# Patient Record
Sex: Female | Born: 1940 | Hispanic: No | Marital: Married | Smoking: Former smoker
Health system: Southern US, Community
[De-identification: ages and names within clinical notes are randomized; demographics above are authoritative.]

## PROBLEM LIST (undated history)

## (undated) DIAGNOSIS — R509 Fever, unspecified: Secondary | ICD-10-CM

## (undated) DIAGNOSIS — R140 Abdominal distension (gaseous): Secondary | ICD-10-CM

## (undated) DIAGNOSIS — J189 Pneumonia, unspecified organism: Secondary | ICD-10-CM

## (undated) DIAGNOSIS — D4959 Neoplasm of unspecified behavior of other genitourinary organ: Secondary | ICD-10-CM

## (undated) DIAGNOSIS — R0902 Hypoxemia: Secondary | ICD-10-CM

## (undated) DIAGNOSIS — I10 Essential (primary) hypertension: Secondary | ICD-10-CM

## (undated) DIAGNOSIS — G4733 Obstructive sleep apnea (adult) (pediatric): Secondary | ICD-10-CM

## (undated) DIAGNOSIS — E039 Hypothyroidism, unspecified: Secondary | ICD-10-CM

## (undated) DIAGNOSIS — I509 Heart failure, unspecified: Secondary | ICD-10-CM

## (undated) DIAGNOSIS — I2510 Atherosclerotic heart disease of native coronary artery without angina pectoris: Secondary | ICD-10-CM

## (undated) DIAGNOSIS — N838 Other noninflammatory disorders of ovary, fallopian tube and broad ligament: Secondary | ICD-10-CM

## (undated) DIAGNOSIS — R519 Headache, unspecified: Secondary | ICD-10-CM

## (undated) DIAGNOSIS — J3489 Other specified disorders of nose and nasal sinuses: Secondary | ICD-10-CM

## (undated) DIAGNOSIS — I110 Hypertensive heart disease with heart failure: Secondary | ICD-10-CM

## (undated) DIAGNOSIS — R32 Unspecified urinary incontinence: Secondary | ICD-10-CM

## (undated) DIAGNOSIS — R29898 Other symptoms and signs involving the musculoskeletal system: Secondary | ICD-10-CM

## (undated) DIAGNOSIS — I639 Cerebral infarction, unspecified: Secondary | ICD-10-CM

## (undated) DIAGNOSIS — IMO0002 Reserved for concepts with insufficient information to code with codable children: Secondary | ICD-10-CM

## (undated) DIAGNOSIS — K219 Gastro-esophageal reflux disease without esophagitis: Secondary | ICD-10-CM

## (undated) DIAGNOSIS — D72829 Elevated white blood cell count, unspecified: Secondary | ICD-10-CM

## (undated) DIAGNOSIS — T4145XA Adverse effect of unspecified anesthetic, initial encounter: Secondary | ICD-10-CM

## (undated) DIAGNOSIS — R0602 Shortness of breath: Secondary | ICD-10-CM

## (undated) DIAGNOSIS — R51 Headache: Secondary | ICD-10-CM

## (undated) DIAGNOSIS — R269 Unspecified abnormalities of gait and mobility: Secondary | ICD-10-CM

## (undated) DIAGNOSIS — T8859XA Other complications of anesthesia, initial encounter: Secondary | ICD-10-CM

## (undated) DIAGNOSIS — R4701 Aphasia: Secondary | ICD-10-CM

## (undated) DIAGNOSIS — M1990 Unspecified osteoarthritis, unspecified site: Secondary | ICD-10-CM

## (undated) DIAGNOSIS — I69359 Hemiplegia and hemiparesis following cerebral infarction affecting unspecified side: Secondary | ICD-10-CM

## (undated) DIAGNOSIS — I69328 Other speech and language deficits following cerebral infarction: Secondary | ICD-10-CM

## (undated) DIAGNOSIS — N189 Chronic kidney disease, unspecified: Secondary | ICD-10-CM

## (undated) DIAGNOSIS — E7800 Pure hypercholesterolemia, unspecified: Secondary | ICD-10-CM

## (undated) DIAGNOSIS — R6510 Systemic inflammatory response syndrome (SIRS) of non-infectious origin without acute organ dysfunction: Secondary | ICD-10-CM

## (undated) DIAGNOSIS — I5032 Chronic diastolic (congestive) heart failure: Secondary | ICD-10-CM

## (undated) HISTORY — DX: Cerebral infarction, unspecified: I63.9

## (undated) HISTORY — DX: Elevated white blood cell count, unspecified: D72.829

## (undated) HISTORY — PX: CHOLECYSTECTOMY: SHX55

## (undated) HISTORY — DX: Essential (primary) hypertension: I10

## (undated) HISTORY — DX: Headache: R51

## (undated) HISTORY — DX: Pneumonia, unspecified organism: J18.9

## (undated) HISTORY — DX: Headache, unspecified: R51.9

## (undated) HISTORY — DX: Obstructive sleep apnea (adult) (pediatric): G47.33

## (undated) HISTORY — PX: TONSILLECTOMY: SUR1361

## (undated) HISTORY — DX: Chronic diastolic (congestive) heart failure: I50.32

## (undated) HISTORY — DX: Unspecified osteoarthritis, unspecified site: M19.90

## (undated) HISTORY — DX: Hemiplegia and hemiparesis following cerebral infarction affecting unspecified side: I69.359

## (undated) HISTORY — DX: Pure hypercholesterolemia, unspecified: E78.00

## (undated) HISTORY — DX: Reserved for concepts with insufficient information to code with codable children: IMO0002

## (undated) HISTORY — DX: Heart failure, unspecified: I50.9

## (undated) HISTORY — DX: Shortness of breath: R06.02

## (undated) HISTORY — DX: Hypertensive heart disease with heart failure: I11.0

## (undated) HISTORY — DX: Neoplasm of unspecified behavior of other genitourinary organ: D49.59

## (undated) HISTORY — DX: Fever, unspecified: R50.9

## (undated) HISTORY — DX: Gastro-esophageal reflux disease without esophagitis: K21.9

## (undated) HISTORY — DX: Abdominal distension (gaseous): R14.0

## (undated) HISTORY — DX: Aphasia: R47.01

## (undated) HISTORY — DX: Other symptoms and signs involving the musculoskeletal system: R29.898

## (undated) HISTORY — PX: ABDOMINAL HYSTERECTOMY: SHX81

## (undated) HISTORY — DX: Unspecified urinary incontinence: R32

## (undated) HISTORY — DX: Hypothyroidism, unspecified: E03.9

## (undated) HISTORY — DX: Other speech and language deficits following cerebral infarction: I69.328

## (undated) HISTORY — PX: MENISCUS REPAIR: SHX5179

## (undated) HISTORY — PX: TOE SURGERY: SHX1073

## (undated) HISTORY — DX: Other noninflammatory disorders of ovary, fallopian tube and broad ligament: N83.8

## (undated) HISTORY — PX: TUBAL LIGATION: SHX77

## (undated) HISTORY — DX: Unspecified abnormalities of gait and mobility: R26.9

## (undated) HISTORY — DX: Systemic inflammatory response syndrome (sirs) of non-infectious origin without acute organ dysfunction: R65.10

## (undated) HISTORY — DX: Hypoxemia: R09.02

## (undated) HISTORY — DX: Atherosclerotic heart disease of native coronary artery without angina pectoris: I25.10

---

## 2008-10-29 ENCOUNTER — Encounter: Admission: RE | Admit: 2008-10-29 | Discharge: 2008-10-29 | Payer: Self-pay | Admitting: Neurology

## 2011-10-26 ENCOUNTER — Ambulatory Visit: Payer: Medicare Other | Attending: Gynecologic Oncology | Admitting: Gynecologic Oncology

## 2011-10-26 ENCOUNTER — Encounter: Payer: Self-pay | Admitting: Gynecologic Oncology

## 2011-10-26 VITALS — BP 124/68 | HR 80 | Temp 97.4°F | Resp 20 | Ht 62.84 in | Wt 181.9 lb

## 2011-10-26 DIAGNOSIS — R1909 Other intra-abdominal and pelvic swelling, mass and lump: Secondary | ICD-10-CM | POA: Insufficient documentation

## 2011-10-26 DIAGNOSIS — E039 Hypothyroidism, unspecified: Secondary | ICD-10-CM | POA: Insufficient documentation

## 2011-10-26 DIAGNOSIS — E7800 Pure hypercholesterolemia, unspecified: Secondary | ICD-10-CM | POA: Insufficient documentation

## 2011-10-26 DIAGNOSIS — I69920 Aphasia following unspecified cerebrovascular disease: Secondary | ICD-10-CM | POA: Insufficient documentation

## 2011-10-26 DIAGNOSIS — Z800 Family history of malignant neoplasm of digestive organs: Secondary | ICD-10-CM | POA: Insufficient documentation

## 2011-10-26 DIAGNOSIS — N838 Other noninflammatory disorders of ovary, fallopian tube and broad ligament: Secondary | ICD-10-CM | POA: Insufficient documentation

## 2011-10-26 DIAGNOSIS — Z87891 Personal history of nicotine dependence: Secondary | ICD-10-CM | POA: Insufficient documentation

## 2011-10-26 DIAGNOSIS — R141 Gas pain: Secondary | ICD-10-CM | POA: Insufficient documentation

## 2011-10-26 DIAGNOSIS — I10 Essential (primary) hypertension: Secondary | ICD-10-CM | POA: Insufficient documentation

## 2011-10-26 DIAGNOSIS — N3946 Mixed incontinence: Secondary | ICD-10-CM | POA: Insufficient documentation

## 2011-10-26 DIAGNOSIS — R142 Eructation: Secondary | ICD-10-CM | POA: Insufficient documentation

## 2011-10-26 DIAGNOSIS — Z7982 Long term (current) use of aspirin: Secondary | ICD-10-CM | POA: Insufficient documentation

## 2011-10-26 DIAGNOSIS — Z90710 Acquired absence of both cervix and uterus: Secondary | ICD-10-CM | POA: Insufficient documentation

## 2011-10-26 DIAGNOSIS — Z79899 Other long term (current) drug therapy: Secondary | ICD-10-CM | POA: Insufficient documentation

## 2011-10-26 DIAGNOSIS — K219 Gastro-esophageal reflux disease without esophagitis: Secondary | ICD-10-CM | POA: Insufficient documentation

## 2011-10-26 DIAGNOSIS — R6881 Early satiety: Secondary | ICD-10-CM | POA: Insufficient documentation

## 2011-10-26 DIAGNOSIS — R29898 Other symptoms and signs involving the musculoskeletal system: Secondary | ICD-10-CM | POA: Insufficient documentation

## 2011-10-26 HISTORY — DX: Other noninflammatory disorders of ovary, fallopian tube and broad ligament: N83.8

## 2011-10-26 NOTE — Patient Instructions (Signed)
Followup with Dr. Leonor Liv

## 2011-10-26 NOTE — Progress Notes (Signed)
Consult Note: Gyn-Onc  April Le 70 y.o. female  CC:  Chief Complaint  Patient presents with  . Gynecologic Exam    Mass    HPI: Patient is a very pleasant 70 year old who is seen today in consultation at the request of Dr. Isabel Caprice.  April Le is a 70 year old gravida 2 para 2 who began having increased abdominal girth approximately 2 months ago.  She attributed this to weight gain and eating too much.  She was seen by the PA at Dr. Ellin Goodie office. Ultimately a CT scan of the abdomen and pelvis was ordered for 10/19/2011.  CT scan revealed a 14 x 19 x 19 cm mass appearing to arise from the right adnexa. Along the ventral margin of the cystic mass was a soft tissue attenuating component measuring 4.8 x 1.8 cm. There was no ascites. There is no lymphadenopathy. There is no evidence of any metastatic disease. The patient comes in with her family today to discuss this with determine disposition Interval History:   Review of Systems: She does admit to the increased abdominal girth over the past 2 months. She has had some pain particularly after her ultrasound that has gone away.  She denies any nausea vomiting fevers chills chest pain or shortness of breath. She denies any changes in her bowel habits. She denies any blood in her stool or blood per vagina. She does state that she has had early satiety for about a 1.5 months. She does have some stress and urge incontinence. The stress incontinence is worse with sneezing and coughing. She has a aphasia status post CVA. She has some difficulty and weakness with her legs left greater than right. She is able to dress herself or her daughter helps her. She's able to shower independently. She has a very good quality of life and very good family support Current Meds:  Outpatient Encounter Prescriptions as of 10/26/2011  Medication Sig Dispense Refill  . amLODipine (NORVASC) 10 MG tablet Take 10 mg by mouth daily.        Marland Kitchen aspirin 81 MG tablet Take 81 mg  by mouth daily.        . Aspirin-Dipyridamole (AGGRENOX PO) Take 60 mg by mouth 2 (two) times daily. 60 cpm at 9am and 5pm       . atorvastatin (LIPITOR) 40 MG tablet Take 40 mg by mouth daily.        . cloNIDine (CATAPRES) 0.1 MG tablet Take 0.1 mg by mouth 2 (two) times daily.        Marland Kitchen FA-Pyridoxine-Cyancobalamin (FOLTX PO) Take by mouth daily.        . fenofibrate (TRICOR) 145 MG tablet Take 145 mg by mouth daily.        Marland Kitchen levothyroxine (SYNTHROID, LEVOTHROID) 50 MCG tablet Take 50 mcg by mouth daily.        . magnesium oxide (MAG-OX) 400 MG tablet Take 400 mg by mouth daily.        . metoprolol (LOPRESSOR) 50 MG tablet Take 50 mg by mouth 2 (two) times daily. One at 9 am and at 8pm       . mometasone (NASONEX) 50 MCG/ACT nasal spray Place 2 sprays into the nose daily.        . pantoprazole (PROTONIX) 40 MG tablet Take 40 mg by mouth daily.        Marland Kitchen RAMIPRIL PO Take 1 tablet by mouth 2 (two) times daily. Unsure of dose, one at 9am and at 5pm       .  trimethoprim (TRIMPEX) 100 MG tablet Take 100 mg by mouth Nightly.          Allergy: No Known Allergies  Social Hx:  She has 2 grown children and 4 grandchildren. Her son lives in [place]. History   Social History  . Marital Status: Married    Spouse Name: N/A    Number of Children: N/A  . Years of Education: N/A   Occupational History  . Not on file.   Social History Main Topics  . Smoking status: Former Smoker    Quit date: 11/15/1959  . Smokeless tobacco: Not on file  . Alcohol Use: No  . Drug Use: No  . Sexually Active: No   Other Topics Concern  . Not on file   Social History Narrative  . No narrative on file    Past Surgical Hx:  Past Surgical History  Procedure Date  . Abdominal hysterectomy   . Tubal ligation   . Cholecystectomy   . Meniscus repair   . Tonsillectomy   . Toe surgery     Past Medical Hx:  Past Medical History  Diagnosis Date  . Stroke 02/2007, 08/2009    2 total  . Hypothyroidism   .  GERD (gastroesophageal reflux disease)   . Hypertension   . Hypercholesteremia   . Headache   . Arthritis     right knee  . 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 Urinary incontinence   . Abdominal distention     past two months  . Weakness of left leg     Mildly, post stroke    Family Hx:  Family History  Problem Relation Age of Onset  . Kidney failure Mother   . Heart attack Father   . Heart failure Father   . Stomach cancer Other     Vitals:  Blood pressure 124/68, pulse 80, temperature 97.4 F (36.3 C), temperature source Oral, resp. rate 20, height 5' 2.84" (1.596 m), weight 181 lb 14.4 oz (82.509 kg).  Physical Exam: Well-nourished well-developed female in no acute distress.  Neck: Supple no lymphadenopathy no thyromegaly.  Lungs: Clear to auscultation bilaterally.  Cardiovascular: Regular rate and rhythm. No murmur.  Abdomen: She has a well-healed vertical midline incision. There is no evidence of an incisional hernia. There is a large abdominal pelvic mass that measures approximately 20 cm. It is nontender. There is no nodularity appreciated. There is no fluid wave. Could not assess the size of the liver secondary to habitus as well as the mass.  Pelvic: External genitalia within normal limits. The mass is out of the pelvis. There is no nodularity.  Extremities: 1-2+ nonpitting edema equal bilaterally. 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 She and her family would like to have this removed if at all possible.  I do believe that she would be a reasonable surgical candidate. However, we need to ask Dr. Leonor Liv for her opinion regarding the ability of April Le to come off her Aggrenox in the perioperative period. Ideally like her off of this for 7 - 10 days before surgery.  I would also like to note  Dr. Leonor Liv opinion regarding the need for therapeutic anticoagulation in the perioperative period rather than just the prophylacticanticoagulation that we typically use.  Risks of this surgery including but not limited to bleeding infection injury to surrounding organs were  discussed with the patient. We also discussed the possibility of recurrent strokes in the setting of her coming off her Aggrenox. Also discussed venous thromboembolic disease and the need for sequential compression of places in the perioperative period. At this point we will followup with Dr. recommendations. We will send a copy of this note to her. She states that Dr. Leonor Liv office has been in contact with the patient already regarding the plan for her. Once we hear back from Dr. Leonor Liv regarding her recommendations we will schedule the patient for surgery.  April Yuille A., MD 10/26/2011, 11:35 AM

## 2011-11-03 ENCOUNTER — Encounter (HOSPITAL_COMMUNITY): Payer: Self-pay | Admitting: Pharmacy Technician

## 2011-11-18 ENCOUNTER — Other Ambulatory Visit (HOSPITAL_COMMUNITY): Payer: Medicare Other

## 2011-11-18 ENCOUNTER — Encounter (HOSPITAL_COMMUNITY)
Admission: RE | Admit: 2011-11-18 | Discharge: 2011-11-18 | Disposition: A | Discharge status: Home / Self Care | Payer: Medicare Other | Source: Ambulatory Visit | Attending: Gynecologic Oncology | Admitting: Gynecologic Oncology

## 2011-11-18 ENCOUNTER — Encounter (HOSPITAL_COMMUNITY): Payer: Self-pay

## 2011-11-18 HISTORY — DX: Chronic kidney disease, unspecified: N18.9

## 2011-11-18 HISTORY — DX: Shortness of breath: R06.02

## 2011-11-18 HISTORY — DX: Other complications of anesthesia, initial encounter: T88.59XA

## 2011-11-18 HISTORY — DX: Other specified disorders of nose and nasal sinuses: J34.89

## 2011-11-18 HISTORY — DX: Adverse effect of unspecified anesthetic, initial encounter: T41.45XA

## 2011-11-18 NOTE — Pre-Procedure Instructions (Signed)
PREOP ANESTHESIA CONSULT BY DR. Shireen Quan.  PT'S EKG, CXR, CBC WITH DIFF, AND CMET REPORTS REVIEWED BY DR. Shireen Quan AND ACCEPTED FOR PT'S SURGERY--REPORTS ARE FROM [facility] IN [place] AND PLACED ON PT'S CHART.  DR. Shireen Quan REVIEWED PREOP CLEARANCE LETTER FROM DR. HOLT WITH [facility] (NOTE PLACED ON PT'S CHART)--DR. SCHUSTER DID NOT WANT PT TO GET LOVENOX ON DAY OF SURGERY--AND CALLED [facility]-SPOKE WITH DR. Jannet Askew KHAN TO SEE IF PT NEEDED LOVENOX BRIDGING--DR. SCHUSTER SAID DECISION MADE THAT BRIDGING NOT NEEDED PREOP.

## 2011-11-18 NOTE — Anesthesia Preprocedure Evaluation (Addendum)
Anesthesia Evaluation  Patient identified by MRN, date of birth, ID band Patient awake  General Assessment Comment:Increased CV risk  Reviewed: Allergy & Precautions, H&P , NPO status , Patient's Chart, lab work & pertinent test results, reviewed documented beta blocker date and time   Airway Mallampati: II  Neck ROM: Full  Mouth opening: Limited Mouth Opening  Dental  (+) Teeth Intact   Pulmonary neg pulmonary ROS,    + decreased breath sounds      Cardiovascular hypertension, Pt. on medications Regular Normal Denies cardiac symptoms   Neuro/Psych CVA X2, '08 & '10 Aphasia CVA, Residual Symptoms Negative Psych ROS   GI/Hepatic negative GI ROS, Neg liver ROS, GERD-  ,  Endo/Other  Hypothyroidism On replacement  Renal/GU Reduced renal function, Cr 1.90   Pelvic mass    Musculoskeletal negative musculoskeletal ROS (+)   Abdominal   Peds negative pediatric ROS (+)  Hematology negative hematology ROS (+)   Anesthesia Other Findings   Reproductive/Obstetrics negative OB ROS                          Anesthesia Physical Anesthesia Plan  ASA: III  Anesthesia Plan: General   Post-op Pain Management:    Induction: Intravenous  Airway Management Planned: Oral ETT  Additional Equipment:   Intra-op Plan:   Post-operative Plan: Extubation in OR  Informed Consent: I have reviewed the patients History and Physical, chart, labs and discussed the procedure including the risks, benefits and alternatives for the proposed anesthesia with the patient or authorized representative who has indicated his/her understanding and acceptance.   Dental advisory given  Plan Discussed with: CRNA  Anesthesia Plan Comments:         Anesthesia Quick Evaluation

## 2011-11-18 NOTE — Patient Instructions (Addendum)
20 April Le  11/18/2011   Your procedure is scheduled on:  Tuesday 1/8  AT 10:30 AM  Report to [facility] at 8:00 AM.  Call this number if you have problems the morning of surgery: [phone]   Remember:   Do not eat food OR DRINK ANYTHING AFTER MIDNIGHT THE NIGHT BEFORE YOUR SURGERY.      Take these medicines the morning of surgery with A SIP OF WATER: AMLODIPINE,  LEVOTHYROXINE, METOPROLOL, AND PANTOPRAZOLE.  MAY USE NASONEX NASAL SPRAY.   Do not wear jewelry, make-up or nail polish.  Do not wear lotions, powders, or perfumes. You may wear deodorant.  Do not shave 48 hours prior to surgery.  Do not bring valuables to the hospital.  Contacts, dentures or bridgework may not be worn into surgery.  Leave suitcase in the car. After surgery it may be brought to your room.  For patients admitted to the hospital, checkout time is 11:00 AM the day of discharge.   Patients discharged the day of surgery will not be allowed to drive home.   Special Instructions: CHG Shower Use Special Wash: 1/2 bottle night before surgery and 1/2 bottle morning of surgery.   Please read over the following fact sheets that you were given: Blood Transfusion Information and MRSA Information AND INCENTIVE SPIROMETRY INSTRUCTIONS PT IS NOT TO TAKE HER CLONIDINE AT HOME THE AM OF SURGERY--SHORT STAY WILL PUT CLONIDINE PATCH ON PT ON ARRIVAL FOR SURGERY.

## 2011-11-18 NOTE — Pre-Procedure Instructions (Deleted)
20 Blenda Wisecup  11/18/2011   Your procedure is scheduled on:  Tuesday 1/8  AT 10:30 AM  Report to [facility] at 8:OO AM.  Call this number if you have problems the morning of surgery: [phone]   Remember:   Do not eat food OR DRINK ANYTHING AFTER MIDNIGHT THE NIGHT BEFORE YOUR SURGERY.   Take these medicines the morning of surgery with A SIP OF WATER: AMLODIPINE, CLONIDINE, LEVOTHYROXINE, METOPROLOL, PANTAPRAZOLE  AND MAY USE NASONEX NASAL SPRAY IF NEEDED   Do not wear jewelry, make-up or nail polish.  Do not wear lotions, powders, or perfumes. You may wear deodorant.  Do not shave 48 hours prior to surgery.  Do not bring valuables to the hospital.  Contacts, dentures or bridgework may not be worn into surgery.  Leave suitcase in the car. After surgery it may be brought to your room.  For patients admitted to the hospital, checkout time is 11:00 AM the day of discharge.   Patients discharged the day of surgery will not be allowed to drive home.   Special Instructions: SHOWER WITH CHG SOAP THE NIGHT BEFORE YOUR SURGERY AND THE AM OF YOUR SURGERY.   Please read over the following fact sheets that you were given: Blood Transfusion Information and MRSA Information AND INCENTIVE SPIROMETRY INSTRUCTIONS

## 2011-11-22 ENCOUNTER — Encounter (HOSPITAL_COMMUNITY): Payer: Self-pay | Admitting: *Deleted

## 2011-11-22 ENCOUNTER — Inpatient Hospital Stay (HOSPITAL_COMMUNITY)
Admission: RE | Admit: 2011-11-22 | Discharge: 2011-11-24 | DRG: 743 | Disposition: A | Discharge status: Home / Self Care | Payer: Medicare Other | Source: Ambulatory Visit | Attending: Obstetrics & Gynecology | Admitting: Obstetrics & Gynecology

## 2011-11-22 ENCOUNTER — Other Ambulatory Visit: Payer: Self-pay | Admitting: Gynecologic Oncology

## 2011-11-22 ENCOUNTER — Inpatient Hospital Stay (HOSPITAL_COMMUNITY): Payer: Medicare Other | Admitting: Anesthesiology

## 2011-11-22 ENCOUNTER — Encounter (HOSPITAL_COMMUNITY): Payer: Self-pay | Admitting: Anesthesiology

## 2011-11-22 ENCOUNTER — Encounter (HOSPITAL_COMMUNITY)
Admission: RE | Disposition: A | Discharge status: Home / Self Care | Payer: Self-pay | Source: Ambulatory Visit | Attending: Obstetrics & Gynecology

## 2011-11-22 DIAGNOSIS — K219 Gastro-esophageal reflux disease without esophagitis: Secondary | ICD-10-CM | POA: Diagnosis present

## 2011-11-22 DIAGNOSIS — D279 Benign neoplasm of unspecified ovary: Principal | ICD-10-CM | POA: Diagnosis present

## 2011-11-22 DIAGNOSIS — I69920 Aphasia following unspecified cerebrovascular disease: Secondary | ICD-10-CM

## 2011-11-22 DIAGNOSIS — I10 Essential (primary) hypertension: Secondary | ICD-10-CM | POA: Diagnosis present

## 2011-11-22 DIAGNOSIS — Z01812 Encounter for preprocedural laboratory examination: Secondary | ICD-10-CM

## 2011-11-22 DIAGNOSIS — R29898 Other symptoms and signs involving the musculoskeletal system: Secondary | ICD-10-CM | POA: Diagnosis present

## 2011-11-22 DIAGNOSIS — N838 Other noninflammatory disorders of ovary, fallopian tube and broad ligament: Secondary | ICD-10-CM

## 2011-11-22 DIAGNOSIS — N3946 Mixed incontinence: Secondary | ICD-10-CM | POA: Diagnosis present

## 2011-11-22 DIAGNOSIS — E039 Hypothyroidism, unspecified: Secondary | ICD-10-CM | POA: Diagnosis present

## 2011-11-22 HISTORY — PX: SALPINGOOPHORECTOMY: SHX82

## 2011-11-22 HISTORY — PX: LAPAROTOMY: SHX154

## 2011-11-22 LAB — SAMPLE TO BLOOD BANK

## 2011-11-22 SURGERY — LAPAROTOMY, EXPLORATORY
Anesthesia: General | Site: Abdomen | Wound class: Clean

## 2011-11-22 MED ORDER — AMLODIPINE BESYLATE 10 MG PO TABS
10.0000 mg | ORAL_TABLET | Freq: Every day | ORAL | Status: DC
  Administered 2011-11-23 – 2011-11-24 (×2): 10 mg via ORAL
  Filled 2011-11-22 (×2): qty 1

## 2011-11-22 MED ORDER — GLYCOPYRROLATE 0.2 MG/ML IJ SOLN
INTRAMUSCULAR | Status: DC | PRN
  Administered 2011-11-22: .7 mg via INTRAVENOUS

## 2011-11-22 MED ORDER — PROMETHAZINE HCL 25 MG/ML IJ SOLN: 6.2500 mg | INTRAMUSCULAR | Status: DC | PRN

## 2011-11-22 MED ORDER — RAMIPRIL 10 MG PO CAPS
10.0000 mg | ORAL_CAPSULE | Freq: Two times a day (BID) | ORAL | Status: DC
  Administered 2011-11-22 – 2011-11-24 (×5): 10 mg via ORAL
  Filled 2011-11-22 (×6): qty 1

## 2011-11-22 MED ORDER — NALOXONE HCL 0.4 MG/ML IJ SOLN: 0.4000 mg | INTRAMUSCULAR | Status: DC | PRN

## 2011-11-22 MED ORDER — LACTATED RINGERS IV SOLN
INTRAVENOUS | Status: DC
  Administered 2011-11-22: 1000 mL via INTRAVENOUS

## 2011-11-22 MED ORDER — CEFUROXIME AXETIL 500 MG PO TABS
500.0000 mg | ORAL_TABLET | Freq: Two times a day (BID) | ORAL | Status: DC
  Administered 2011-11-22 – 2011-11-24 (×5): 500 mg via ORAL
  Filled 2011-11-22 (×6): qty 1

## 2011-11-22 MED ORDER — ONDANSETRON HCL 4 MG/2ML IJ SOLN
4.0000 mg | Freq: Four times a day (QID) | INTRAMUSCULAR | Status: DC | PRN
  Administered 2011-11-23: 4 mg via INTRAVENOUS
  Filled 2011-11-22: qty 2

## 2011-11-22 MED ORDER — CLONIDINE HCL 0.2 MG/24HR TD PTWK
0.2000 mg | MEDICATED_PATCH | Freq: Once | TRANSDERMAL | Status: AC
  Administered 2011-11-22: 0.2 mg via TRANSDERMAL
  Filled 2011-11-22: qty 1

## 2011-11-22 MED ORDER — PROPOFOL 10 MG/ML IV EMUL
INTRAVENOUS | Status: DC | PRN
  Administered 2011-11-22: 130 mg via INTRAVENOUS

## 2011-11-22 MED ORDER — 0.9 % SODIUM CHLORIDE (POUR BTL) OPTIME
TOPICAL | Status: DC | PRN
  Administered 2011-11-22: 1000 mL

## 2011-11-22 MED ORDER — ONDANSETRON HCL 4 MG/2ML IJ SOLN: 4.0000 mg | Freq: Four times a day (QID) | INTRAMUSCULAR | Status: DC | PRN

## 2011-11-22 MED ORDER — EPHEDRINE SULFATE 50 MG/ML IJ SOLN
INTRAMUSCULAR | Status: DC | PRN
  Administered 2011-11-22: 10 mg via INTRAVENOUS
  Administered 2011-11-22: 5 mg via INTRAVENOUS

## 2011-11-22 MED ORDER — SUCCINYLCHOLINE CHLORIDE 20 MG/ML IJ SOLN
INTRAMUSCULAR | Status: DC | PRN
  Administered 2011-11-22: 100 mg via INTRAVENOUS

## 2011-11-22 MED ORDER — DIPHENHYDRAMINE HCL 12.5 MG/5ML PO ELIX: 12.5000 mg | ORAL_SOLUTION | Freq: Four times a day (QID) | ORAL | Status: DC | PRN

## 2011-11-22 MED ORDER — HYDROMORPHONE HCL PF 1 MG/ML IJ SOLN
0.2500 mg | INTRAMUSCULAR | Status: DC | PRN
  Administered 2011-11-22 (×2): 0.25 mg via INTRAVENOUS

## 2011-11-22 MED ORDER — FENTANYL CITRATE 0.05 MG/ML IJ SOLN
INTRAMUSCULAR | Status: DC | PRN
  Administered 2011-11-22: 50 ug via INTRAVENOUS
  Administered 2011-11-22: 100 ug via INTRAVENOUS
  Administered 2011-11-22 (×2): 50 ug via INTRAVENOUS

## 2011-11-22 MED ORDER — PANTOPRAZOLE SODIUM 40 MG PO TBEC
40.0000 mg | DELAYED_RELEASE_TABLET | Freq: Every day | ORAL | Status: DC
  Administered 2011-11-23 – 2011-11-24 (×2): 40 mg via ORAL
  Filled 2011-11-22 (×3): qty 1

## 2011-11-22 MED ORDER — ONDANSETRON HCL 4 MG PO TABS: 4.0000 mg | ORAL_TABLET | Freq: Four times a day (QID) | ORAL | Status: DC | PRN

## 2011-11-22 MED ORDER — SODIUM CHLORIDE 0.9 % IJ SOLN: 9.0000 mL | INTRAMUSCULAR | Status: DC | PRN

## 2011-11-22 MED ORDER — DIPHENHYDRAMINE HCL 50 MG/ML IJ SOLN: 12.5000 mg | Freq: Four times a day (QID) | INTRAMUSCULAR | Status: DC | PRN

## 2011-11-22 MED ORDER — HYDROMORPHONE 0.3 MG/ML IV SOLN
INTRAVENOUS | Status: DC
  Administered 2011-11-22: 0.3 mg via INTRAVENOUS
  Administered 2011-11-22: 0.999 mg via INTRAVENOUS
  Administered 2011-11-22: 0.2 mg via INTRAVENOUS
  Administered 2011-11-23: 0.4 mg via INTRAVENOUS
  Administered 2011-11-23: 10:00:00 via INTRAVENOUS
  Administered 2011-11-23: 1.19 mg via INTRAVENOUS
  Administered 2011-11-23: 19:00:00 via INTRAVENOUS
  Administered 2011-11-24: 0.399 mg via INTRAVENOUS
  Administered 2011-11-24: 0.4 mg via INTRAVENOUS
  Filled 2011-11-22 (×4): qty 25

## 2011-11-22 MED ORDER — KCL IN DEXTROSE-NACL 20-5-0.45 MEQ/L-%-% IV SOLN
INTRAVENOUS | Status: DC
  Administered 2011-11-22: 125 mL via INTRAVENOUS
  Administered 2011-11-22 – 2011-11-23 (×3): via INTRAVENOUS
  Administered 2011-11-24: 125 mL via INTRAVENOUS
  Filled 2011-11-22 (×10): qty 1000

## 2011-11-22 MED ORDER — ENOXAPARIN SODIUM 40 MG/0.4ML ~~LOC~~ SOLN
40.0000 mg | SUBCUTANEOUS | Status: DC
  Administered 2011-11-22 – 2011-11-24 (×2): 40 mg via SUBCUTANEOUS
  Filled 2011-11-22 (×3): qty 0.4

## 2011-11-22 MED ORDER — ONDANSETRON HCL 4 MG/2ML IJ SOLN
INTRAMUSCULAR | Status: DC | PRN
  Administered 2011-11-22: 4 mg via INTRAVENOUS

## 2011-11-22 MED ORDER — CLONIDINE HCL 0.1 MG PO TABS
0.1000 mg | ORAL_TABLET | Freq: Two times a day (BID) | ORAL | Status: DC
  Administered 2011-11-22 – 2011-11-24 (×5): 0.1 mg via ORAL
  Filled 2011-11-22 (×6): qty 1

## 2011-11-22 MED ORDER — ZOLPIDEM TARTRATE 5 MG PO TABS: 5.0000 mg | ORAL_TABLET | Freq: Every evening | ORAL | Status: DC | PRN

## 2011-11-22 MED ORDER — LEVOTHYROXINE SODIUM 50 MCG PO TABS
50.0000 ug | ORAL_TABLET | Freq: Every day | ORAL | Status: DC
  Administered 2011-11-23 – 2011-11-24 (×2): 50 ug via ORAL
  Filled 2011-11-22 (×3): qty 1

## 2011-11-22 MED ORDER — NEOSTIGMINE METHYLSULFATE 1 MG/ML IJ SOLN
INTRAMUSCULAR | Status: DC | PRN
  Administered 2011-11-22: 4 mg via INTRAVENOUS

## 2011-11-22 MED ORDER — ACETAMINOPHEN 10 MG/ML IV SOLN
INTRAVENOUS | Status: DC | PRN
  Administered 2011-11-22: 1000 mg via INTRAVENOUS

## 2011-11-22 MED ORDER — LACTATED RINGERS IV SOLN
INTRAVENOUS | Status: DC | PRN
  Administered 2011-11-22: 09:00:00 via INTRAVENOUS

## 2011-11-22 MED ORDER — METOPROLOL TARTRATE 50 MG PO TABS
50.0000 mg | ORAL_TABLET | Freq: Two times a day (BID) | ORAL | Status: DC
  Administered 2011-11-22 – 2011-11-24 (×5): 50 mg via ORAL
  Filled 2011-11-22 (×6): qty 1

## 2011-11-22 MED ORDER — OXYCODONE-ACETAMINOPHEN 5-325 MG PO TABS: 1.0000 | ORAL_TABLET | ORAL | Status: DC | PRN

## 2011-11-22 MED ORDER — ROCURONIUM BROMIDE 100 MG/10ML IV SOLN
INTRAVENOUS | Status: DC | PRN
  Administered 2011-11-22: 10 mg via INTRAVENOUS
  Administered 2011-11-22: 35 mg via INTRAVENOUS

## 2011-11-22 MED ORDER — CEFAZOLIN SODIUM-DEXTROSE 2-3 GM-% IV SOLR
2.0000 g | INTRAVENOUS | Status: AC
  Administered 2011-11-22: 2 g via INTRAVENOUS

## 2011-11-22 SURGICAL SUPPLY — 62 items
APL SKNCLS STERI-STRIP NONHPOA (GAUZE/BANDAGES/DRESSINGS) ×3
ATTRACTOMAT 16X20 MAGNETIC DRP (DRAPES) ×4 IMPLANT
BAG SPEC RTRVL LRG 6X4 10 (ENDOMECHANICALS)
BAG URINE DRAINAGE (UROLOGICAL SUPPLIES) ×2 IMPLANT
BENZOIN TINCTURE PRP APPL 2/3 (GAUZE/BANDAGES/DRESSINGS) ×4 IMPLANT
BLADE EXTENDED COATED 6.5IN (ELECTRODE) ×4 IMPLANT
BLADE SURG 15 STRL LF DISP TIS (BLADE) ×3 IMPLANT
BLADE SURG 15 STRL SS (BLADE) ×4
CABLE HIGH FREQUENCY MONO STRZ (ELECTRODE) ×2 IMPLANT
CANISTER SUCTION 2500CC (MISCELLANEOUS) ×4 IMPLANT
CATH FOLEY 2WAY SLVR  5CC 14FR (CATHETERS) ×1
CATH FOLEY 2WAY SLVR  5CC 16FR (CATHETERS)
CATH FOLEY 2WAY SLVR 5CC 14FR (CATHETERS) ×1 IMPLANT
CATH FOLEY 2WAY SLVR 5CC 16FR (CATHETERS) ×2 IMPLANT
CATH ROBINSON RED A/P 16FR (CATHETERS) IMPLANT
CHLORAPREP W/TINT 26ML (MISCELLANEOUS) ×6 IMPLANT
CLEANER TIP ELECTROSURG 2X2 (MISCELLANEOUS) ×4 IMPLANT
CLIP TI MEDIUM LARGE 6 (CLIP) ×4 IMPLANT
CLOTH BEACON ORANGE TIMEOUT ST (SAFETY) ×4 IMPLANT
COVER MAYO STAND STRL (DRAPES) ×2 IMPLANT
DEVICE TROCAR PUNCTURE CLOSURE (ENDOMECHANICALS) IMPLANT
DRAPE TABLE BACK 44X90 PK DISP (DRAPES) ×2 IMPLANT
DRAPE WARM FLUID 44X44 (DRAPE) ×4 IMPLANT
ELECT REM PT RETURN 9FT ADLT (ELECTROSURGICAL) ×4
ELECTRODE REM PT RTRN 9FT ADLT (ELECTROSURGICAL) ×3 IMPLANT
GAUZE SPONGE 4X4 16PLY XRAY LF (GAUZE/BANDAGES/DRESSINGS) ×4 IMPLANT
GLOVE BIO SURGEON STRL SZ 6.5 (GLOVE) ×4 IMPLANT
GLOVE BIO SURGEON STRL SZ7.5 (GLOVE) ×8 IMPLANT
GLOVE BIOGEL PI IND STRL 7.0 (GLOVE) ×3 IMPLANT
GLOVE BIOGEL PI INDICATOR 7.0 (GLOVE) ×1
HOLDER FOLEY CATH W/STRAP (MISCELLANEOUS) ×2 IMPLANT
MANIPULATOR UTERINE 4.5 ZUMI (MISCELLANEOUS) IMPLANT
NDL HYPO 25X1 1.5 SAFETY (NEEDLE) ×2 IMPLANT
NDL INSUFFLATION 14GA 150MM (NEEDLE) IMPLANT
NEEDLE HYPO 25X1 1.5 SAFETY (NEEDLE) IMPLANT
NEEDLE INSUFFLATION 14GA 150MM (NEEDLE) ×4 IMPLANT
NS IRRIG 1000ML POUR BTL (IV SOLUTION) ×16 IMPLANT
PACK ABDOMINAL WL (CUSTOM PROCEDURE TRAY) ×4 IMPLANT
PACK LAPAROSCOPY W LONG (CUSTOM PROCEDURE TRAY) ×2 IMPLANT
PENCIL BUTTON HOLSTER BLD 10FT (ELECTRODE) ×4 IMPLANT
POUCH SPECIMEN RETRIEVAL 10MM (ENDOMECHANICALS) IMPLANT
SCISSORS LAP 5X35 DISP (ENDOMECHANICALS) ×4 IMPLANT
SET IRRIG TUBING LAPAROSCOPIC (IRRIGATION / IRRIGATOR) IMPLANT
SHEET LAVH (DRAPES) ×4 IMPLANT
SPONGE LAP 18X18 X RAY DECT (DISPOSABLE) ×4 IMPLANT
STRIP CLOSURE SKIN 1/2X4 (GAUZE/BANDAGES/DRESSINGS) ×4 IMPLANT
SUT PDS AB 1 CTXB1 36 (SUTURE) ×8 IMPLANT
SUT VIC AB 0 CT1 36 (SUTURE) ×20 IMPLANT
SUT VIC AB 2-0 CT2 27 (SUTURE) IMPLANT
SUT VIC AB 3-0 PS2 18 (SUTURE)
SUT VIC AB 3-0 PS2 18XBRD (SUTURE) ×4 IMPLANT
SUT VICRYL 0 UR6 27IN ABS (SUTURE) ×2 IMPLANT
SUT VICRYL 2 0 18  UND BR (SUTURE) ×1
SUT VICRYL 2 0 18 UND BR (SUTURE) ×3 IMPLANT
SYR CONTROL 10ML LL (SYRINGE) ×2 IMPLANT
TOWEL OR 17X26 10 PK STRL BLUE (TOWEL DISPOSABLE) ×4 IMPLANT
TROCAR BLADELESS OPT 5 100 (ENDOMECHANICALS) ×4 IMPLANT
TROCAR ENDOPATH XCEL 12X100 BL (ENDOMECHANICALS) ×2 IMPLANT
TROCAR XCEL 12X100 BLDLESS (ENDOMECHANICALS) ×2 IMPLANT
TROCAR XCEL BLUNT TIP 100MML (ENDOMECHANICALS) ×2 IMPLANT
TUBING NON-CON 1/4 X 20 CONN (TUBING) ×2 IMPLANT
UNDERPAD 30X30 INCONTINENT (UNDERPADS AND DIAPERS) ×2 IMPLANT

## 2011-11-22 NOTE — Interval H&P Note (Signed)
History and Physical Interval Note:  11/22/2011 10:10 AM  April Le  has presented today for surgery, with the diagnosis of Mass, Pelvic  The various methods of treatment have been discussed with the patient and family. After consideration of risks, benefits and other options for treatment, the patient has consented to  Procedure(s): EXPLORATORY LAPAROTOMY LAPAROSCOPIC BILATERAL SALPINGO OOPHERECTOMY as a surgical intervention .  The patients' history has been reviewed, patient examined, no change in status, stable for surgery.  I have reviewed the patients' chart and labs.  Questions were answered to the patient's satisfaction.  Per anesthesia discussion with primary MD, we will not proceed with lovenox pre-operatively but will give dose 40 mg 12 hours after surgery.   Ledon Weihe A.

## 2011-11-22 NOTE — Transfer of Care (Signed)
Immediate Anesthesia Transfer of Care Note  Patient: April Le  Procedure(s) Performed:  EXPLORATORY LAPAROTOMY; SALPINGO OOPHERECTOMY  Patient Location: PACU  Anesthesia Type: General  Level of Consciousness: awake and alert   Airway & Oxygen Therapy: Patient Spontanous Breathing and Patient connected to face mask oxygen  Post-op Assessment: Report given to PACU RN and Post -op Vital signs reviewed and stable  Post vital signs: Reviewed and stable  Complications: No apparent anesthesia complications

## 2011-11-22 NOTE — H&P (View-Only) (Signed)
Consult Note: Gyn-Onc  April Le 70 y.o. female  CC:  Chief Complaint  Patient presents with  . Gynecologic Exam    Mass    HPI: Patient is a very pleasant 70-year-old who is seen today in consultation at the request of Dr. Grapey.  April Le is a 70-year-old gravida 2 para 2 who began having increased abdominal girth approximately 2 months ago.  She attributed this to weight gain and eating too much.  She was seen by the PA at Dr. Grapey's office. Ultimately a CT scan of the abdomen and pelvis was ordered for 10/19/2011.  CT scan revealed a 14 x 19 x 19 cm mass appearing to arise from the right adnexa. Along the ventral margin of the cystic mass was a soft tissue attenuating component measuring 4.8 x 1.8 cm. There was no ascites. There is no lymphadenopathy. There is no evidence of any metastatic disease. The patient comes in with her family today to discuss this with determine disposition Interval History:   Review of Systems: She does admit to the increased abdominal girth over the past 2 months. She has had some pain particularly after her ultrasound that has gone away.  She denies any nausea vomiting fevers chills chest pain or shortness of breath. She denies any changes in her bowel habits. She denies any blood in her stool or blood per vagina. She does state that she has had early satiety for about a 1.5 months. She does have some stress and urge incontinence. The stress incontinence is worse with sneezing and coughing. She has a aphasia status post CVA. She has some difficulty and weakness with her legs left greater than right. She is able to dress herself or her daughter helps her. She's able to shower independently. She has a very good quality of life and very good family support Current Meds:  Outpatient Encounter Prescriptions as of 10/26/2011  Medication Sig Dispense Refill  . amLODipine (NORVASC) 10 MG tablet Take 10 mg by mouth daily.        . aspirin 81 MG tablet Take 81 mg  by mouth daily.        . Aspirin-Dipyridamole (AGGRENOX PO) Take 60 mg by mouth 2 (two) times daily. 60 cpm at 9am and 5pm       . atorvastatin (LIPITOR) 40 MG tablet Take 40 mg by mouth daily.        . cloNIDine (CATAPRES) 0.1 MG tablet Take 0.1 mg by mouth 2 (two) times daily.        . FA-Pyridoxine-Cyancobalamin (FOLTX PO) Take by mouth daily.        . fenofibrate (TRICOR) 145 MG tablet Take 145 mg by mouth daily.        . levothyroxine (SYNTHROID, LEVOTHROID) 50 MCG tablet Take 50 mcg by mouth daily.        . magnesium oxide (MAG-OX) 400 MG tablet Take 400 mg by mouth daily.        . metoprolol (LOPRESSOR) 50 MG tablet Take 50 mg by mouth 2 (two) times daily. One at 9 am and at 8pm       . mometasone (NASONEX) 50 MCG/ACT nasal spray Place 2 sprays into the nose daily.        . pantoprazole (PROTONIX) 40 MG tablet Take 40 mg by mouth daily.        . RAMIPRIL PO Take 1 tablet by mouth 2 (two) times daily. Unsure of dose, one at 9am and at 5pm       .   trimethoprim (TRIMPEX) 100 MG tablet Take 100 mg by mouth Nightly.          Allergy: No Known Allergies  Social Hx:  She has 2 grown children and 4 grandchildren. Her son lives in [place]. History   Social History  . Marital Status: Married    Spouse Name: N/A    Number of Children: N/A  . Years of Education: N/A   Occupational History  . Not on file.   Social History Main Topics  . Smoking status: Former Smoker    Quit date: 11/15/1959  . Smokeless tobacco: Not on file  . Alcohol Use: No  . Drug Use: No  . Sexually Active: No   Other Topics Concern  . Not on file   Social History Narrative  . No narrative on file    Past Surgical Hx:  Past Surgical History  Procedure Date  . Abdominal hysterectomy   . Tubal ligation   . Cholecystectomy   . Meniscus repair   . Tonsillectomy   . Toe surgery     Past Medical Hx:  Past Medical History  Diagnosis Date  . Stroke 02/2007, 08/2009    2 total  . Hypothyroidism   .  GERD (gastroesophageal reflux disease)   . Hypertension   . Hypercholesteremia   . Headache   . Arthritis     right knee  . 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 Urinary incontinence   . Abdominal distention     past two months  . Weakness of left leg     Mildly, post stroke    Family Hx:  Family History  Problem Relation Age of Onset  . Kidney failure Mother   . Heart attack Father   . Heart failure Father   . Stomach cancer Other     Vitals:  Blood pressure 124/68, pulse 80, temperature 97.4 F (36.3 C), temperature source Oral, resp. rate 20, height 5' 2.84" (1.596 m), weight 181 lb 14.4 oz (82.509 kg).  Physical Exam: Well-nourished well-developed female in no acute distress.  Neck: Supple no lymphadenopathy no thyromegaly.  Lungs: Clear to auscultation bilaterally.  Cardiovascular: Regular rate and rhythm. No murmur.  Abdomen: She has a well-healed vertical midline incision. There is no evidence of an incisional hernia. There is a large abdominal pelvic mass that measures approximately 20 cm. It is nontender. There is no nodularity appreciated. There is no fluid wave. Could not assess the size of the liver secondary to habitus as well as the mass.  Pelvic: External genitalia within normal limits. The mass is out of the pelvis. There is no nodularity.  Extremities: 1-2+ nonpitting edema equal bilaterally. 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 She and her family would like to have this removed if at all possible.  I do believe that she would be a reasonable surgical candidate. However, we need to ask Dr. Holt for her opinion regarding the ability of April Le to come off her Aggrenox in the perioperative period. Ideally like her off of this for 7 - 10 days before surgery.  I would also like to note  Dr. Holt opinion regarding the need for therapeutic anticoagulation in the perioperative period rather than just the prophylacticanticoagulation that we typically use.  Risks of this surgery including but not limited to bleeding infection injury to surrounding organs were   discussed with the patient. We also discussed the possibility of recurrent strokes in the setting of her coming off her Aggrenox. Also discussed venous thromboembolic disease and the need for sequential compression of places in the perioperative period. At this point we will followup with Dr. recommendations. We will send a copy of this note to her. She states that Dr. Holt office has been in contact with the patient already regarding the plan for her. Once we hear back from Dr. Holt regarding her recommendations we will schedule the patient for surgery.  Lamekia Nolden A., MD 10/26/2011, 11:35 AM   

## 2011-11-22 NOTE — Anesthesia Procedure Notes (Signed)
Procedure Name: Intubation Date/Time: 11/22/2011 10:40 AM Performed by: [place], Kay Ricciuti C Pre-anesthesia Checklist: Patient identified, Timeout performed, Emergency Drugs available, Suction available and Patient being monitored Patient Re-evaluated:Patient Re-evaluated prior to inductionOxygen Delivery Method: Circle System Utilized Preoxygenation: Pre-oxygenation with 100% oxygen Intubation Type: IV induction Ventilation: Mask ventilation without difficulty Laryngoscope Size: Mac and 3 Grade View: Grade II Tube type: Oral Tube size: 7.0 mm Number of attempts: 1 Airway Equipment and Method: stylet Placement Confirmation: ETT inserted through vocal cords under direct vision,  breath sounds checked- equal and bilateral,  positive ETCO2 and CO2 detector Secured at: 21 cm Tube secured with: Tape Dental Injury: Teeth and Oropharynx as per pre-operative assessment

## 2011-11-22 NOTE — Progress Notes (Signed)
Pt is alert and oriented, vital signs are stable, incision to abd is clean dry and intact, pt with pain pca which is effective for pain control, pt with no compliants of nausea or vomiting, lungs with rhonci and pt with productive cough with yellow tinged sputum, bowel sounds are hypoactive belly is distended but soft, pt enouraged to get up to chair from bed but patient declined and stated later, SCDs present, urine output is adequate, will continue to monitor Means, Jeryl Umholtz N 11-22-11 18:36pm

## 2011-11-22 NOTE — Anesthesia Postprocedure Evaluation (Signed)
  Anesthesia Post-op Note  Patient: April Le  Procedure(s) Performed:  EXPLORATORY LAPAROTOMY; SALPINGO OOPHERECTOMY  Patient Location: PACU  Anesthesia Type: General  Level of Consciousness: awake and alert   Airway and Oxygen Therapy: Patient Spontanous Breathing  Post-op Pain: mild  Post-op Assessment: Post-op Vital signs reviewed, Patient's Cardiovascular Status Stable, Respiratory Function Stable, Patent Airway and No signs of Nausea or vomiting  Post-op Vital Signs: stable  Complications: No apparent anesthesia complications

## 2011-11-22 NOTE — Op Note (Signed)
PATIENT: April Le DATE OF BIRTH: 10-26-1941 ENCOUNTER DATE: 11/22/2011   Preop Diagnosis: Pelvic mass  Postoperative Diagnosis: Cyst adenofibroma  Surgery: Exploratory laparotomy bilateral salpingo-oophorectomy  Surgeons:  Marny Smethers A. Duard Brady, MD; Antionette Char, MD   Assistant: Telford Nab   Anesthesia: General   Estimated blood loss: 50 ml   IVF: 1700 ml   Urine output: 200 ml   Complications: None   Pathology: Bilateral tubes and ovaries  Operative findings: 20 cm right ovarian mass. Frozen section revealed a benign cyst adenofibroma. The left ovary was normal in appearance adherent to the rectosigmoid colon. Surgically absent uterus.  Procedure: The patient was identified in the preoperative holding area. Informed consent was signed on the chart. Patient was seen history was reviewed and exam was performed.   The patient was then taken to the operating room and placed in the supine position with SCD hose on. General anesthesia was then induced without difficulty. She was then placed in the dorsolithotomy position. The perineum was prepped in the usual fashion a Foley catheter was inserted into the bladder under sterile conditions. The abdomen was prepped with 2 chlor prep sponges per protocol.  After assuring that the prep was dried. The patient was then draped. Timeout was performed to confirm the patient procedure antibiotic allergy status. SCDs were on. A vertical midline incision was made with the knife and carried down to the underlying fascia using sharp dissection.  The fascia was ordered the midline. The fascial incision was extended superiorly and inferiorly. The rectus bellies were dissected off the overlying fashion the peritoneum was tented and entered sharply. The peritoneal incision was extended superiorly and inferiorly with visualization of the underlying peritoneal cavity. There was small amount of ascites in the pelvis is was removed without difficulty.  The  mass is completely smooth. There is no nodularity and the mass was not adherent. The area around the mass was packed with dry laparotomy sponges. Using a varies needle the mass was penetrated and drained of 1900 ml of serous fluid. There was no leakage.  A right angle was placed on the puncture site to assure no leakage. The mass was delivered to the abdomen. A window was made under the IP between the ureter. The IP was clamped x2 with Kelly clamps transected and suture ligated. There was adhesive disease of the mass to the old round ligament on the right side. A Kelly clamp was brought across this and the mass was transected and the ovary was sent for frozen section. Frozen section returned as a benign cystadenofibroma.  There is adhesive disease of the rectosigmoid colon to the vaginal cuff. This was taken down using sharp dissection. A Buchwalter self-retaining was then placed on the been usual fashion. The shortest retractor blades were applied. The small and large bowel were packed into the upper abdomen with moist laparotomy sponges.  The left ovary was adherent to the rectosigmoid colon. These adhesions were taken down sharply. The retroperitoneum was opened on her left side. The ureter was identified. A window was made between the IP ligament and the ureter. The IP was clamped transected and suture ligated. There is adhesive disease similar to that on the right side of the ovary to the remnant of the round ligaments. These adhesions were taken down. The small pedicle was then clamped with Anderson clamps transected and suture ligated. The left tube and ovary were then delivered to the abdomen. All pedicles were inspected and noted to be hemostatic.  The abdomen  and pelvis were irrigated. The retractor blades were removed as were all laparotomy sponges. The omentum was brought down between the bowel and anterior abdominal wall and the retractor was removed.  The fascia was closed using a running suture of  #1 PDS. The subcutaneous tissues were irrigated and noted to be hemostatic. This skin was closed using staples. All instrument needle and laparotomy counts were correct x2.     The patient tolerated the procedure well and was taken to the recovery room in stable condition. This is Cleda Mccreedy dictating an operative note on patient April Le.

## 2011-11-23 ENCOUNTER — Encounter (HOSPITAL_COMMUNITY): Payer: Self-pay | Admitting: Gynecologic Oncology

## 2011-11-23 LAB — CBC
HCT: 34.8 % — ABNORMAL LOW (ref 36.0–46.0)
Platelets: 233 10*3/uL (ref 150–400)
RDW: 13.3 % (ref 11.5–15.5)
WBC: 10.8 10*3/uL — ABNORMAL HIGH (ref 4.0–10.5)

## 2011-11-23 LAB — BASIC METABOLIC PANEL
Chloride: 103 mEq/L (ref 96–112)
GFR calc Af Amer: 45 mL/min — ABNORMAL LOW (ref >90)
Potassium: 4.7 mEq/L (ref 3.5–5.1)
Sodium: 136 mEq/L (ref 135–145)

## 2011-11-23 LAB — GLUCOSE, CAPILLARY: Glucose-Capillary: 100 mg/dL — ABNORMAL HIGH (ref 70–99)

## 2011-11-23 MED ORDER — MAGNESIUM HYDROXIDE 400 MG/5ML PO SUSP
30.0000 mL | Freq: Two times a day (BID) | ORAL | Status: AC
  Administered 2011-11-23: 30 mL via ORAL
  Filled 2011-11-23 (×2): qty 30

## 2011-11-23 MED ORDER — BISACODYL 10 MG RE SUPP
10.0000 mg | Freq: Every day | RECTAL | Status: DC
  Administered 2011-11-23: 10 mg via RECTAL
  Filled 2011-11-23: qty 1

## 2011-11-23 NOTE — Progress Notes (Signed)
Patient ID: Vora Clover, female   DOB: 1941-07-05, 71 y.o.   MRN: [id number] 1 Day Post-Op Procedure(s) (LRB): EXPLORATORY LAPAROTOMY (N/A) SALPINGO OOPHERECTOMY (Bilateral)  Subjective: Patient reports no complaints.    Objective: Vital signs in last 24 hours: Temp:  [97.3 F (36.3 C)-98.6 F (37 C)] 98.4 F (36.9 C) (01/09 0600) Pulse Rate:  [57-73] 64  (01/09 0600) Resp:  [13-20] 18  (01/09 0600) BP: (120-156)/(58-92) 120/80 mmHg (01/09 0600) SpO2:  [90 %-98 %] 96 % (01/09 0600) Weight:  [85.3 kg (188 lb 0.8 oz)] 85.3 kg (188 lb 0.8 oz) (01/08 1325)    Intake/Output from previous day: 01/08 0701 - 01/09 0700 In: 2020 [P.O.:120; I.V.:1900] Out: 1050 [Urine:1000; Blood:50]     Physical Examination: General: alert GI: abnormal findings:  distended, hypoactive bowel sounds and Incision: C/D/I Extremities: extremities normal, atraumatic, no cyanosis or edema  Labs: WBC/Hgb/Hct/Plts:  10.8/11.7/34.8/233 (01/09 0447) BUN/Cr/glu/ALT/AST/amyl/lip:  14/1.34/--/--/--/--/-- (01/09 0447)   Assessment:  71 y.o. s/p Procedure(s): EXPLORATORY LAPAROTOMY SALPINGO OOPHERECTOMY: stable Pain:  Pain is well-controlled on PCA.  CV: Hypertension: controlled. Current treatment:  amlodipine (Norvase), clonidine (Catapres) and ramipril.  GI:  ?early mild ileus   Prophylaxis: pharmacologic prophylaxis (with any of the following: LMWH) and intermittent pneumatic compression boots.  Plan: Clear liquids MOM/dulcolax supp Ambulate   LOS: 1 day    JACKSON-MOORE,Tayten Heber A 11/23/2011, 8:20 AM

## 2011-11-23 NOTE — Progress Notes (Signed)
INITIAL ADULT NUTRITION ASSESSMENT Date: 11/23/2011   Time: 10:41 AM Reason for Assessment: Nutrition risk   ASSESSMENT: Female 71 y.o.  Dx: Pelvic mass  Hx:  Past Medical History  Diagnosis Date  . Hypothyroidism   . GERD (gastroesophageal reflux disease)   . Hypercholesteremia   . Headache   . Arthritis     right knee  . 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 Urinary incontinence   . Abdominal distention     past two months  . Weakness of left leg     Mildly, post stroke  . Hypertension   . Stroke 02/2007, 08/2009    2 total -RESIDUAL WEAKNESS LEFT LEG--AND APHASIA  . Shortness of breath     WITH EXERTION  . Sinus drainage     PT STARTED ANTIBIOTIC 11/17/11 --IS HOARSE TODAY, SOME WHEEZING AND COUGH WITH YELLOW DRAINAGE  . Chronic kidney disease     STAGE III KIDNEY DISEASE-PER PT--PT SEES DR. Isabel Caprice UROLOGIST  . Complication of anesthesia     PT REMEMBERS BREATHING PROBLEMS WAKING UP FROM KNEE SURGERY AT [facility] 2011 OR 2012   Related Meds:  Scheduled Meds:   . amLODipine  10 mg Oral Q breakfast  . bisacodyl  10 mg Rectal Daily  . cefUROXime  500 mg Oral BID  . cloNIDine  0.1 mg Oral BID  . enoxaparin (LOVENOX) injection  40 mg Subcutaneous Q24H  . HYDROmorphone PCA 0.3 mg/mL   Intravenous Q4H  . levothyroxine  50 mcg Oral Q0600  . magnesium hydroxide  30 mL Oral Q12H  . metoprolol  50 mg Oral BID  . pantoprazole  40 mg Oral Daily  . ramipril  10 mg Oral BID   Continuous Infusions:   . dextrose 5 % and 0.45 % NaCl with KCl 20 mEq/L 125 mL/hr at 11/23/11 0429  . DISCONTD: lactated ringers 1,000 mL (11/22/11 0921)   PRN Meds:.diphenhydrAMINE, diphenhydrAMINE, naloxone, ondansetron (ZOFRAN) IV, ondansetron (ZOFRAN) IV, ondansetron, oxyCODONE-acetaminophen, sodium chloride, zolpidem, DISCONTD: 0.9 % irrigation (POUR BTL), DISCONTD: HYDROmorphone, DISCONTD: promethazine  Ht: 5' (152.4 cm)  Wt: 188 lb 0.8 oz (85.3 kg)  Ideal Wt: 45.5 kg  % Ideal Wt:  187  Usual Wt: 77.3kg % Usual Wt: 110  Body mass index is 36.73 kg/(m^2).  Food/Nutrition Related Hx: POD#1 exploratory laparotomy and bilateral salpingo oophorectomy. Met with pt, husband, and 2 children, who report pt eating well PTA, however some increase from usual weight r/t ovarian mass, which husband said was 4 pounds in weight after surgery. Pt c/o some nausea r/t medications. Pt denies any difficulty swallowing, but does prefer soft foods and soups. Pt with coughing r/t sinus infection she had prior to surgery.   Labs:  CMP     Component Value Date/Time   NA 136 11/23/2011 0447   K 4.7 11/23/2011 0447   CL 103 11/23/2011 0447   CO2 26 11/23/2011 0447   GLUCOSE 128* 11/23/2011 0447   BUN 14 11/23/2011 0447   CREATININE 1.34* 11/23/2011 0447   CALCIUM 8.8 11/23/2011 0447   GFRNONAA 39* 11/23/2011 0447   GFRAA 45* 11/23/2011 0447   CBG (last 3)  No results found for this basename: GLUCAP:3 in the last 72 hours   Intake/Output Summary (Last 24 hours) at 11/23/11 1047 Last data filed at 11/23/11 0852  Gross per 24 hour  Intake   2140 ml  Output   1050 ml  Net   1090 ml    Diet Order:  General  IVF:    dextrose 5 % and 0.45 % NaCl with KCl 20 mEq/L Last Rate: 125 mL/hr at 11/23/11 1610  DISCONTD: lactated ringers Last Rate: 1,000 mL (11/22/11 0921)    Estimated Nutritional Needs:   Kcal:1700-1950 Protein:70-85g Fluid:1.7-1.9L  NUTRITION DIAGNOSIS: -Predicted suboptimal energy intake (NI-1.6).  Status: Ongoing  RELATED TO: nausea  AS EVIDENCE BY: pt statement  MONITORING/EVALUATION(Goals): 1. Resolution of nausea 2. Pt to consume >75% of meals.   EDUCATION NEEDS: -Education needs addressed - reviewed bland foods and nutrition therapy for nausea  INTERVENTION: Provided pt with ginger ale and crackers. Reviewed menu with pt and answered questions on how to order meals. Will downgrade diet to mechanical soft per pt preference. RN aware of nausea. Hopefully as Zofran helps  with nausea, intake will improve. Will monitor.  Dietitian #: [phone]  DOCUMENTATION CODES Per approved criteria  -Obesity Unspecified    Marshall Cork 11/23/2011, 10:41 AM

## 2011-11-24 ENCOUNTER — Encounter: Payer: Self-pay | Admitting: Gynecologic Oncology

## 2011-11-24 ENCOUNTER — Telehealth: Payer: Self-pay | Admitting: *Deleted

## 2011-11-24 MED ORDER — ASPIRIN-DIPYRIDAMOLE ER 25-200 MG PO CP12
1.0000 | ORAL_CAPSULE | Freq: Two times a day (BID) | ORAL | Status: DC
  Administered 2011-11-24: 1 via ORAL
  Filled 2011-11-24 (×2): qty 1

## 2011-11-24 MED ORDER — OXYCODONE-ACETAMINOPHEN 5-325 MG PO TABS: 2.0000 | ORAL_TABLET | Freq: Four times a day (QID) | ORAL | Status: AC | PRN

## 2011-11-24 NOTE — Discharge Summary (Signed)
Physician Discharge Summary  Patient ID: April [name] MRN: [id number] DOB/AGE: October 03, 1941 71 y.o.  Admit date: 11/22/2011 Discharge date: 11/24/2011  Admission Diagnoses:  Pelvic mass  Discharge Diagnoses: Pelvic mass, benign   Discharged Condition: good  Hospital Course: The patient's postoperative course was uneventful.   She was tolerating a regular diet at discharge.  Consults: none  Significant Diagnostic Studies: none  Treatments: surgery: Exploratory laparotomy, BSO  Discharge Exam: Blood pressure 118/76, pulse 65, temperature 98.8 F (37.1 C), temperature source Oral, resp. rate 20, height 5' (1.524 m), weight 85.3 kg (188 lb 0.8 oz), SpO2 94.00%. General appearance: alert GI: soft, non-tender; bowel sounds normal; no masses,  no organomegaly Extremities: extremities normal, atraumatic, no cyanosis or edema Incision/Wound:  C/D/I  Disposition: Final discharge disposition not confirmed  Discharge Orders    Future Orders Please Complete By Expires   Diet - low sodium heart healthy      Activity as tolerated - No restrictions      Increase activity slowly      May walk up steps      May shower / Bathe      Comments:   No tub baths for 6 weeks   Driving Restrictions      Comments:   No driving for 1- 2 weeks   Lifting restrictions      Comments:   No lifting > 30 lbs for 6 weeks   Sexual Activity Restrictions      Comments:   No intercourse for 6 - 8 weeks   Discharge wound care:      Comments:   Keep clean and dry   Call MD for:  temperature >100.4      Call MD for:  persistant nausea and vomiting      Call MD for:  severe uncontrolled pain      Call MD for:  redness, tenderness, or signs of infection (pain, swelling, redness, odor or green/yellow discharge around incision site)      Call MD for:  persistant dizziness or light-headedness      Call MD for:  extreme fatigue        Current Discharge Medication List    START taking these medications   Details  oxyCODONE-acetaminophen (PERCOCET) 5-325 MG per tablet Take 2 tablets by mouth every 6 (six) hours as needed for pain. Qty: 30 tablet, Refills: 0      CONTINUE these medications which have NOT CHANGED   Details  amLODipine (NORVASC) 10 MG tablet Take 10 mg by mouth daily with breakfast.     aspirin 81 MG tablet Take 81 mg by mouth daily with breakfast.     atorvastatin (LIPITOR) 40 MG tablet Take 40 mg by mouth at bedtime.     cefdinir (OMNICEF) 300 MG capsule Take 300 mg by mouth 2 (two) times daily. PT STARTED ANTIBIOTIC FOR SINUS DRAINAGE.     cloNIDine (CATAPRES) 0.1 MG tablet Take 0.1 mg by mouth 2 (two) times daily.     FA-Pyridoxine-Cyancobalamin (FOLTX PO) Take 1 tablet by mouth daily.     fenofibrate (TRICOR) 145 MG tablet Take 145 mg by mouth daily with breakfast.     levothyroxine (SYNTHROID, LEVOTHROID) 50 MCG tablet Take 50 mcg by mouth daily at 6 (six) AM.     magnesium oxide (MAG-OX) 400 MG tablet Take 400 mg by mouth daily.     metoprolol (LOPRESSOR) 50 MG tablet Take 50 mg by mouth 2 (two) times daily. One at 9 am  and at 8pm    pantoprazole (PROTONIX) 40 MG tablet Take 40 mg by mouth daily.     RAMIPRIL PO Take 10 mg by mouth 2 (two) times daily. one at 9am and at 5pm    trimethoprim (TRIMPEX) 100 MG tablet Take 100 mg by mouth at bedtime.     Aspirin-Dipyridamole (AGGRENOX PO) Take 60 mg by mouth 2 (two) times daily. 60 MG at 9am and 5pm    mometasone (NASONEX) 50 MCG/ACT nasal spray Place 2 sprays into the nose daily as needed. ALLERGIES         Follow-up Information    Follow up on 11/30/2011. (Arrive between 10 and 11 am to [facility]; ask for  Harriett Sine)          Signed: Antionette Char A 11/24/2011, 8:34 AM

## 2011-11-24 NOTE — Telephone Encounter (Signed)
Patient notified of Path results.  F/U appt 11/30/11 wit Telford Nab RN MSN for staple removal

## 2011-11-24 NOTE — Progress Notes (Signed)
Patient ID: April [name], female   DOB: May 10, 1941, 71 y.o.   MRN: [id number] 2 Days Post-Op Procedure(s) (LRB): EXPLORATORY LAPAROTOMY (N/A) SALPINGO OOPHERECTOMY (Bilateral)  Subjective: Patient reports no problems voiding.    Objective: Vital signs in last 24 hours: Temp:  [97.8 F (36.6 C)-98.8 F (37.1 C)] 98.8 F (37.1 C) (01/10 0500) Pulse Rate:  [63-69] 65  (01/10 0500) Resp:  [16-20] 20  (01/10 0500) BP: (114-147)/(65-76) 118/76 mmHg (01/10 0500) SpO2:  [93 %-96 %] 94 % (01/10 0500) Last BM Date: 11/23/11  Intake/Output from previous day: 01/09 0701 - 01/10 0700 In: 240 [P.O.:240] Out: 700 [Urine:700] Intake/Output this shift: Total I/O In: -  Out: 100 [Urine:100]  Physical Examination: General: cooperative GI: soft, non-tender; bowel sounds normal; no masses,  no organomegaly and incision: clean, dry and intact Extremities: extremities normal, atraumatic, no cyanosis or edema         Assessment:  71 y.o. s/p Procedure(s): EXPLORATORY LAPAROTOMY SALPINGO OOPHERECTOMY: stable Pain:  Pain is well-controlled on PCA.  CV: Hypertension:  controlled.   GI:  Tolerating po: Yes    Prophylaxis: pharmacologic prophylaxis (with any of the following: enoxaparin (Lovenox) 40mg  SQ 2 hours prior to surgery then every day) and intermittent pneumatic compression boots.  Plan: Advance diet Encourage ambulation Advance to PO medication Discontinue IV fluids Discharge home  The patient is to be discharged to home.   LOS: 2 days    April [name] A 11/24/2011, 8:18 AM

## 2011-12-01 ENCOUNTER — Telehealth: Payer: Self-pay | Admitting: Gynecologic Oncology

## 2011-12-01 ENCOUNTER — Encounter: Payer: Self-pay | Admitting: *Deleted

## 2011-12-01 NOTE — Progress Notes (Signed)
Pt in for wound check 11/30/11.  Staples removed and steri-strip applied except for lower pole of incision which separated and released serosanguinous fluid. Area pink and healthy without evidence of infection.  Area is 5cm long and 3cm deep.  Cleansed with NS and packed with 1 4x4 and covered with a second.  Husband taught and participated in wound care and arrangements made with [facility] for [facility] nursing.  Reportable signs and symptoms reviewed. Return appts: 12/15/11 and 01/05/12.

## 2011-12-01 NOTE — Telephone Encounter (Signed)
Spoke with pt and husband.  Instructed that Norco 5/325 1 to 2 Q4-6hr as needed for pain was called in to [facility] in [place] at [phone].  Instructed to maintain hydration and monitor bowel patterns due to risk of constipation with Norco.  No other concerns voiced.  Follow-up appointments on 12/14/10 at 12:00 and 01/05/12 at 09:45 confirmed and reinforced.  Reportable symptoms discussed and reviewed.

## 2011-12-15 ENCOUNTER — Ambulatory Visit: Payer: BC Managed Care – PPO | Attending: Gynecologic Oncology | Admitting: Gynecologic Oncology

## 2011-12-15 ENCOUNTER — Encounter: Payer: Self-pay | Admitting: Gynecologic Oncology

## 2011-12-15 ENCOUNTER — Telehealth: Payer: Self-pay | Admitting: *Deleted

## 2011-12-15 VITALS — BP 118/62 | HR 74 | Temp 98.3°F | Resp 16 | Ht 62.0 in | Wt 167.9 lb

## 2011-12-15 DIAGNOSIS — Z87891 Personal history of nicotine dependence: Secondary | ICD-10-CM | POA: Insufficient documentation

## 2011-12-15 DIAGNOSIS — I69920 Aphasia following unspecified cerebrovascular disease: Secondary | ICD-10-CM | POA: Insufficient documentation

## 2011-12-15 DIAGNOSIS — K219 Gastro-esophageal reflux disease without esophagitis: Secondary | ICD-10-CM | POA: Insufficient documentation

## 2011-12-15 DIAGNOSIS — N183 Chronic kidney disease, stage 3 unspecified: Secondary | ICD-10-CM | POA: Insufficient documentation

## 2011-12-15 DIAGNOSIS — I69998 Other sequelae following unspecified cerebrovascular disease: Secondary | ICD-10-CM | POA: Insufficient documentation

## 2011-12-15 DIAGNOSIS — Z90710 Acquired absence of both cervix and uterus: Secondary | ICD-10-CM | POA: Insufficient documentation

## 2011-12-15 DIAGNOSIS — Z7982 Long term (current) use of aspirin: Secondary | ICD-10-CM | POA: Insufficient documentation

## 2011-12-15 DIAGNOSIS — Z9079 Acquired absence of other genital organ(s): Secondary | ICD-10-CM | POA: Insufficient documentation

## 2011-12-15 DIAGNOSIS — Z09 Encounter for follow-up examination after completed treatment for conditions other than malignant neoplasm: Secondary | ICD-10-CM | POA: Insufficient documentation

## 2011-12-15 DIAGNOSIS — I129 Hypertensive chronic kidney disease with stage 1 through stage 4 chronic kidney disease, or unspecified chronic kidney disease: Secondary | ICD-10-CM | POA: Insufficient documentation

## 2011-12-15 DIAGNOSIS — R29898 Other symptoms and signs involving the musculoskeletal system: Secondary | ICD-10-CM | POA: Insufficient documentation

## 2011-12-15 DIAGNOSIS — E039 Hypothyroidism, unspecified: Secondary | ICD-10-CM | POA: Insufficient documentation

## 2011-12-15 DIAGNOSIS — R1900 Intra-abdominal and pelvic swelling, mass and lump, unspecified site: Secondary | ICD-10-CM

## 2011-12-15 DIAGNOSIS — Z79899 Other long term (current) drug therapy: Secondary | ICD-10-CM | POA: Insufficient documentation

## 2011-12-15 NOTE — Telephone Encounter (Signed)
Left message notifying patient that home health services had been discontinued since their services were no longer required for dressing changes.  Instructed to call for any questions.

## 2011-12-15 NOTE — Patient Instructions (Signed)
Twice daily dressing changes.

## 2011-12-15 NOTE — Progress Notes (Signed)
Consult Note: Gyn-Onc  April Le 71 y.o. female  CC:  Chief Complaint  Patient presents with  . Pelvic Mass    Follow up    HPI:HPI:  Patient is a very pleasant 71 year old who is seen today in consultation at the request of Dr. Isabel Le. Mrs. April Le is a 71 year old gravida 2 para 2 who began having increased abdominal girth approximately 2 months ago. She attributed this to weight gain and eating too much. She was seen by the PA at Dr. Ellin Le office. Ultimately a CT scan of the abdomen and pelvis was ordered for 10/19/2011. CT scan revealed a 14 x 19 x 19 cm mass appearing to arise from the right adnexa. Along the ventral margin of the cystic mass was a soft tissue attenuating component measuring 4.8 x 1.8 cm. There was no ascites. There is no lymphadenopathy. There is no evidence of any metastatic disease.  Interval History:  After consultation at the patient opted for definitive surgery. On January 8 U. 2013 she underwent exploratory laparotomy bilateral salpingo-oophorectomy. Operative findings included 20 cm right ovarian mass. Frozen section revealed a benign cystadenofibroma. The left ovary was normal in appearance appeared to the rectosigmoid colon. She is surgically absent uterus. She had uncomplicated postoperative course. 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 There is no atypia or malignancy identified. There is benign fallopian tube tissue with no evidence of endometriosis atypia or malignancy. The left fallopian tube and ovary similarly had a benign serous cystadenofibroma which measured 2 cm with no atypia or malignancy. The fallopian tube on that side was similarly negative.  Review of Systems:  She feels about 85% of her cells. Her energy levels within the last thing to come back. She lost about 14 pounds compared to her preoperative visit. Her appetite is down but is improving she denies a change in her bowel or  bladder habits. She just finished a course of ciprofloxacin for urinary tract infection. She did have a small superficial and separation at the inferior aspect. She is undergoing twice a day dressing changes with her husband.   Interval History:   Review of Systems  Current Meds:  Outpatient Encounter Prescriptions as of 12/15/2011  Medication Sig Dispense Refill  . amLODipine (NORVASC) 10 MG tablet Take 10 mg by mouth daily with breakfast.       . aspirin 81 MG tablet Take 81 mg by mouth daily with breakfast.       . Aspirin-Dipyridamole (AGGRENOX PO) Take 60 mg by mouth 2 (two) times daily. 60 MG at 9am and 5pm      . atorvastatin (LIPITOR) 40 MG tablet Take 40 mg by mouth at bedtime.       . cefdinir (OMNICEF) 300 MG capsule Take 300 mg by mouth 2 (two) times daily. PT STARTED ANTIBIOTIC FOR SINUS DRAINAGE.       . cloNIDine (CATAPRES) 0.1 MG tablet Take 0.1 mg by mouth 2 (two) times daily.       Marland Kitchen FA-Pyridoxine-Cyancobalamin (FOLTX PO) Take 1 tablet by mouth daily.       . fenofibrate (TRICOR) 145 MG tablet Take 145 mg by mouth daily with breakfast.       . levothyroxine (SYNTHROID, LEVOTHROID) 50 MCG tablet Take 50 mcg by mouth daily at 6 (six) AM.       . magnesium oxide (MAG-OX) 400 MG tablet Take 400 mg by mouth daily.       Marland Kitchen  metoprolol (LOPRESSOR) 50 MG tablet Take 50 mg by mouth 2 (two) times daily. One at 9 am and at 8pm      . mometasone (NASONEX) 50 MCG/ACT nasal spray Place 2 sprays into the nose daily as needed. ALLERGIES        . pantoprazole (PROTONIX) 40 MG tablet Take 40 mg by mouth daily.       Marland Kitchen RAMIPRIL PO Take 10 mg by mouth 2 (two) times daily. one at 9am and at 5pm      . trimethoprim (TRIMPEX) 100 MG tablet Take 100 mg by mouth at bedtime.         Allergy:  Allergies  Allergen Reactions  . Augmentin     GI INTOLERANCE ONLY WITH DIARRHEA    Social Hx:   History   Social History  . Marital Status: Married    Spouse Name: N/A    Number of Children: N/A   . Years of Education: N/A   Occupational History  . Not on file.   Social History Main Topics  . Smoking status: Former Smoker    Quit date: 11/15/1959  . Smokeless tobacco: Not on file  . Alcohol Use: No  . Drug Use: No  . Sexually Active: No   Other Topics Concern  . Not on file   Social History Narrative  . No narrative on file    Past Surgical Hx:  Past Surgical History  Procedure Date  . Abdominal hysterectomy   . Tubal ligation   . Cholecystectomy   . Meniscus repair   . Tonsillectomy   . Toe surgery   . Laparotomy 11/22/2011    Procedure: EXPLORATORY LAPAROTOMY;  Surgeon: April Brock A. April Brady, MD;  Location: [facility]S;  Service: Gynecology;  Laterality: N/A;  . Salpingoophorectomy 11/22/2011    Procedure: SALPINGO OOPHERECTOMY;  Surgeon: April Brock A. April Brady, MD;  Location: [facility];  Service: Gynecology;  Laterality: Bilateral;    Past Medical Hx:  Past Medical History  Diagnosis Date  . Hypothyroidism   . GERD (gastroesophageal reflux disease)   . Hypercholesteremia   . Headache   . Arthritis     right knee  . 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 Urinary incontinence   . Abdominal distention     past two months  . Weakness of left leg     Mildly, post stroke  . Hypertension   . Stroke 02/2007, 08/2009    2 total -RESIDUAL WEAKNESS LEFT LEG--AND APHASIA  . Shortness of breath     WITH EXERTION  . Sinus drainage     PT STARTED ANTIBIOTIC 11/17/11 --IS HOARSE TODAY, SOME WHEEZING AND COUGH WITH YELLOW DRAINAGE  . Chronic kidney disease     STAGE III KIDNEY DISEASE-PER PT--PT SEES DR. Isabel Le UROLOGIST  . Complication of anesthesia     PT REMEMBERS BREATHING PROBLEMS WAKING UP FROM KNEE SURGERY AT [facility] 2011 OR 2012    Family Hx:  Family History  Problem Relation Age of Onset  . Kidney failure Mother   . Heart attack Father   . Heart failure Father   . Stomach cancer Other     Vitals:  Blood pressure 118/62, pulse 74, temperature 98.3 F (36.8 C),  resp. rate 16, height 5\' 2"  (1.575 m), weight 167 lb 14.4 oz (76.159 kg).  Physical Exam: Well-nourished well-developed female in no acute distress. Abdomen: Well-healed vertical midline incision. There is a small approximately 3 x 1 cm area of superficial separation the  inferior aspect of the wound were it met her prior transverse skin incision. There is clean granulation tissue. Abdomen is soft and nontender. A 2 x 2 by moist was placed in the area of the superficial wound separation  Assessment/Plan: 71 year old with multiple medical problems who is status post exploratory laparotomy bilateral salpingo-oophorectomy for benign disease. She is overall doing very well. She did have a superficial wound separation that is healing well. She's recently completed a course of antibiotics for urinary tract infection. She'll be discharged from our clinic. She knows to call the next 2-3 weeks if the incision has not completely healed. She knows that we will be happy to see her when necessary.  Tiffanie Blassingame A., MD 12/15/2011, 12:13 PM

## 2011-12-16 MED FILL — Heparin Sodium (Porcine) Inj 1000 Unit/ML: INTRAMUSCULAR | Qty: 1 | Status: AC

## 2011-12-21 ENCOUNTER — Ambulatory Visit: Payer: Medicare Other | Admitting: Gynecologic Oncology

## 2012-01-05 ENCOUNTER — Ambulatory Visit: Payer: Medicare Other | Attending: Gynecologic Oncology | Admitting: Gynecologic Oncology

## 2013-06-06 ENCOUNTER — Encounter (INDEPENDENT_AMBULATORY_CARE_PROVIDER_SITE_OTHER): Payer: Medicare Other

## 2013-06-06 DIAGNOSIS — I10 Essential (primary) hypertension: Secondary | ICD-10-CM

## 2013-09-03 ENCOUNTER — Emergency Department (HOSPITAL_COMMUNITY): Payer: Medicare Other

## 2013-09-03 ENCOUNTER — Inpatient Hospital Stay (HOSPITAL_COMMUNITY)
Admission: EM | Admit: 2013-09-03 | Discharge: 2013-09-07 | DRG: 871 | Disposition: A | Discharge status: Home Health | Payer: Medicare Other | Attending: Internal Medicine | Admitting: Internal Medicine

## 2013-09-03 ENCOUNTER — Encounter (HOSPITAL_COMMUNITY): Payer: Self-pay | Admitting: Emergency Medicine

## 2013-09-03 DIAGNOSIS — A419 Sepsis, unspecified organism: Principal | ICD-10-CM | POA: Diagnosis present

## 2013-09-03 DIAGNOSIS — E039 Hypothyroidism, unspecified: Secondary | ICD-10-CM | POA: Diagnosis present

## 2013-09-03 DIAGNOSIS — I5031 Acute diastolic (congestive) heart failure: Secondary | ICD-10-CM | POA: Diagnosis present

## 2013-09-03 DIAGNOSIS — R609 Edema, unspecified: Secondary | ICD-10-CM | POA: Diagnosis present

## 2013-09-03 DIAGNOSIS — G4733 Obstructive sleep apnea (adult) (pediatric): Secondary | ICD-10-CM | POA: Diagnosis present

## 2013-09-03 DIAGNOSIS — N183 Chronic kidney disease, stage 3 unspecified: Secondary | ICD-10-CM | POA: Diagnosis present

## 2013-09-03 DIAGNOSIS — I498 Other specified cardiac arrhythmias: Secondary | ICD-10-CM | POA: Diagnosis present

## 2013-09-03 DIAGNOSIS — I69920 Aphasia following unspecified cerebrovascular disease: Secondary | ICD-10-CM

## 2013-09-03 DIAGNOSIS — R0902 Hypoxemia: Secondary | ICD-10-CM | POA: Diagnosis present

## 2013-09-03 DIAGNOSIS — R6510 Systemic inflammatory response syndrome (SIRS) of non-infectious origin without acute organ dysfunction: Secondary | ICD-10-CM | POA: Diagnosis present

## 2013-09-03 DIAGNOSIS — I471 Supraventricular tachycardia, unspecified: Secondary | ICD-10-CM | POA: Diagnosis not present

## 2013-09-03 DIAGNOSIS — I129 Hypertensive chronic kidney disease with stage 1 through stage 4 chronic kidney disease, or unspecified chronic kidney disease: Secondary | ICD-10-CM | POA: Diagnosis present

## 2013-09-03 DIAGNOSIS — J189 Pneumonia, unspecified organism: Secondary | ICD-10-CM | POA: Diagnosis present

## 2013-09-03 DIAGNOSIS — E669 Obesity, unspecified: Secondary | ICD-10-CM | POA: Diagnosis present

## 2013-09-03 DIAGNOSIS — J40 Bronchitis, not specified as acute or chronic: Secondary | ICD-10-CM | POA: Diagnosis present

## 2013-09-03 DIAGNOSIS — D72829 Elevated white blood cell count, unspecified: Secondary | ICD-10-CM

## 2013-09-03 DIAGNOSIS — E7800 Pure hypercholesterolemia, unspecified: Secondary | ICD-10-CM | POA: Diagnosis present

## 2013-09-03 DIAGNOSIS — I69922 Dysarthria following unspecified cerebrovascular disease: Secondary | ICD-10-CM

## 2013-09-03 DIAGNOSIS — R509 Fever, unspecified: Secondary | ICD-10-CM | POA: Diagnosis present

## 2013-09-03 DIAGNOSIS — I509 Heart failure, unspecified: Secondary | ICD-10-CM | POA: Diagnosis present

## 2013-09-03 DIAGNOSIS — Z87891 Personal history of nicotine dependence: Secondary | ICD-10-CM

## 2013-09-03 DIAGNOSIS — E785 Hyperlipidemia, unspecified: Secondary | ICD-10-CM | POA: Diagnosis present

## 2013-09-03 DIAGNOSIS — R0602 Shortness of breath: Secondary | ICD-10-CM | POA: Diagnosis present

## 2013-09-03 DIAGNOSIS — K219 Gastro-esophageal reflux disease without esophagitis: Secondary | ICD-10-CM | POA: Diagnosis present

## 2013-09-03 LAB — CBC WITH DIFFERENTIAL/PLATELET
Basophils Relative: 0 % (ref 0–1)
Eosinophils Relative: 0 % (ref 0–5)
HCT: 37.6 % (ref 36.0–46.0)
Hemoglobin: 12.8 g/dL (ref 12.0–15.0)
Lymphocytes Relative: 7 % — ABNORMAL LOW (ref 12–46)
Monocytes Absolute: 1.2 10*3/uL — ABNORMAL HIGH (ref 0.1–1.0)
Monocytes Relative: 7 % (ref 3–12)
Neutrophils Relative %: 86 % — ABNORMAL HIGH (ref 43–77)
Platelets: 251 10*3/uL (ref 150–400)
RBC: 4.07 MIL/uL (ref 3.87–5.11)
WBC: 16.8 10*3/uL — ABNORMAL HIGH (ref 4.0–10.5)

## 2013-09-03 LAB — URINALYSIS W MICROSCOPIC + REFLEX CULTURE
Ketones, ur: NEGATIVE mg/dL
Nitrite: NEGATIVE
Protein, ur: NEGATIVE mg/dL
Specific Gravity, Urine: 1.025 (ref 1.005–1.030)
pH: 5 (ref 5.0–8.0)

## 2013-09-03 LAB — COMPREHENSIVE METABOLIC PANEL
ALT: 24 U/L (ref 0–35)
AST: 38 U/L — ABNORMAL HIGH (ref 0–37)
Albumin: 3.4 g/dL — ABNORMAL LOW (ref 3.5–5.2)
Alkaline Phosphatase: 75 U/L (ref 39–117)
BUN: 18 mg/dL (ref 6–23)
CO2: 21 mEq/L (ref 19–32)
Calcium: 9.8 mg/dL (ref 8.4–10.5)
Chloride: 99 mEq/L (ref 96–112)
GFR calc Af Amer: 43 mL/min — ABNORMAL LOW (ref >90)
GFR calc non Af Amer: 37 mL/min — ABNORMAL LOW (ref >90)
Glucose, Bld: 204 mg/dL — ABNORMAL HIGH (ref 70–99)
Sodium: 132 mEq/L — ABNORMAL LOW (ref 135–145)
Total Protein: 7.5 g/dL (ref 6.0–8.3)

## 2013-09-03 LAB — PRO B NATRIURETIC PEPTIDE: Pro B Natriuretic peptide (BNP): 501.7 pg/mL — ABNORMAL HIGH (ref 0–125)

## 2013-09-03 LAB — PROTIME-INR: INR: 1.08 (ref 0.00–1.49)

## 2013-09-03 LAB — TROPONIN I: Troponin I: <0.3 ng/mL (ref <0.30)

## 2013-09-03 MED ORDER — VANCOMYCIN HCL IN DEXTROSE 1-5 GM/200ML-% IV SOLN
1000.0000 mg | Freq: Once | INTRAVENOUS | Status: AC
  Administered 2013-09-04: 1000 mg via INTRAVENOUS
  Filled 2013-09-03: qty 200

## 2013-09-03 MED ORDER — PIPERACILLIN-TAZOBACTAM 3.375 G IVPB
3.3750 g | Freq: Once | INTRAVENOUS | Status: AC
  Administered 2013-09-03: 3.375 g via INTRAVENOUS
  Filled 2013-09-03: qty 50

## 2013-09-03 MED ORDER — SODIUM CHLORIDE 0.9 % IV BOLUS (SEPSIS)
500.0000 mL | Freq: Once | INTRAVENOUS | Status: AC
  Administered 2013-09-03: 500 mL via INTRAVENOUS

## 2013-09-03 MED ORDER — IOHEXOL 350 MG/ML SOLN
100.0000 mL | Freq: Once | INTRAVENOUS | Status: AC | PRN
  Administered 2013-09-03: 100 mL via INTRAVENOUS

## 2013-09-03 NOTE — ED Provider Notes (Signed)
CSN: [id number]     Arrival date & time 09/03/13  1952 History   First MD Initiated Contact with Patient 09/03/13 2003     Chief Complaint  Patient presents with  . Shortness of Breath   (Consider location/radiation/quality/duration/timing/severity/associated sxs/prior Treatment) Patient is a 72 y.o. female presenting with shortness of breath. The history is provided by the patient and a caregiver.  Shortness of Breath Severity:  Mild Onset quality:  Gradual Duration:  3 weeks Timing:  Constant Progression:  Worsening Chronicity:  New Context comment:  When supine Relieved by:  Sitting up Exacerbated by: supine position. Ineffective treatments:  None tried Associated symptoms: cough (yellow sputum) and fever (yesterday and today)   Associated symptoms: no abdominal pain, no chest pain, no headaches, no neck pain and no vomiting     Past Medical History  Diagnosis Date  . Hypothyroidism   . GERD (gastroesophageal reflux disease)   . Hypercholesteremia   . Headache(784.0)   . Arthritis     right knee  . 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 Urinary incontinence   . Abdominal distention     past two months  . Weakness of left leg     Mildly, post stroke  . Hypertension   . Stroke 02/2007, 08/2009    2 total -RESIDUAL WEAKNESS LEFT LEG--AND APHASIA  . Shortness of breath     WITH EXERTION  . Sinus drainage     PT STARTED ANTIBIOTIC 11/17/11 --IS HOARSE TODAY, SOME WHEEZING AND COUGH WITH YELLOW DRAINAGE  . Chronic kidney disease     STAGE III KIDNEY DISEASE-PER PT--PT SEES DR. Isabel Caprice UROLOGIST  . Complication of anesthesia     PT REMEMBERS BREATHING PROBLEMS WAKING UP FROM KNEE SURGERY AT [facility] 2011 OR 2012   Past Surgical History  Procedure Laterality Date  . Abdominal hysterectomy    . Tubal ligation    . Cholecystectomy    . Meniscus repair    . Tonsillectomy    . Toe surgery    . Laparotomy  11/22/2011    Procedure: EXPLORATORY LAPAROTOMY;  Surgeon:  Rejeana Brock A. Duard Brady, MD;  Location: [facility];  Service: Gynecology;  Laterality: N/A;  . Salpingoophorectomy  11/22/2011    Procedure: SALPINGO OOPHERECTOMY;  Surgeon: Rejeana Brock A. Duard Brady, MD;  Location: [facility];  Service: Gynecology;  Laterality: Bilateral;   Family History  Problem Relation Age of Onset  . Kidney failure Mother   . Heart attack Father   . Heart failure Father   . Stomach cancer Other    History  Substance Use Topics  . Smoking status: Former Smoker    Quit date: 11/15/1959  . Smokeless tobacco: Not on file  . Alcohol Use: No   OB History   Grav Para Term Preterm Abortions TAB SAB Ect Mult Living                 Review of Systems  Constitutional: Positive for fever (yesterday and today). Negative for fatigue.  HENT: Negative for congestion and drooling.   Eyes: Negative for pain.  Respiratory: Positive for cough (yellow sputum) and shortness of breath.   Cardiovascular: Negative for chest pain.  Gastrointestinal: Negative for nausea, vomiting, abdominal pain and diarrhea.  Genitourinary: Negative for dysuria and hematuria.  Musculoskeletal: Negative for back pain, gait problem and neck pain.       Mild swelling in bilateral LE's.   Skin: Negative for color change.  Neurological: Negative for dizziness and headaches.  Hematological: Negative for adenopathy.  Psychiatric/Behavioral: Negative for behavioral problems.  All other systems reviewed and are negative.    Allergies  Amoxicillin-pot clavulanate  Home Medications   Current Outpatient Rx  Name  Route  Sig  Dispense  Refill  . amLODipine (NORVASC) 10 MG tablet   Oral   Take 10 mg by mouth daily with breakfast.          . aspirin 81 MG tablet   Oral   Take 81 mg by mouth daily with breakfast.          . Aspirin-Dipyridamole (AGGRENOX PO)   Oral   Take 60 mg by mouth 2 (two) times daily. 60 MG at 9am and 5pm         . atorvastatin (LIPITOR) 40 MG tablet   Oral   Take 40 mg by mouth at bedtime.           . cefdinir (OMNICEF) 300 MG capsule   Oral   Take 300 mg by mouth 2 (two) times daily. PT STARTED ANTIBIOTIC FOR SINUS DRAINAGE.          . cloNIDine (CATAPRES) 0.1 MG tablet   Oral   Take 0.1 mg by mouth 2 (two) times daily.          Marland Kitchen FA-Pyridoxine-Cyancobalamin (FOLTX PO)   Oral   Take 1 tablet by mouth daily.          . fenofibrate (TRICOR) 145 MG tablet   Oral   Take 145 mg by mouth daily with breakfast.          . levothyroxine (SYNTHROID, LEVOTHROID) 50 MCG tablet   Oral   Take 50 mcg by mouth daily at 6 (six) AM.          . magnesium oxide (MAG-OX) 400 MG tablet   Oral   Take 400 mg by mouth daily.          . metoprolol (LOPRESSOR) 50 MG tablet   Oral   Take 50 mg by mouth 2 (two) times daily. One at 9 am and at 8pm         . mometasone (NASONEX) 50 MCG/ACT nasal spray   Nasal   Place 2 sprays into the nose daily as needed. ALLERGIES           . pantoprazole (PROTONIX) 40 MG tablet   Oral   Take 40 mg by mouth daily.          Marland Kitchen RAMIPRIL PO   Oral   Take 10 mg by mouth 2 (two) times daily. one at 9am and at 5pm         . trimethoprim (TRIMPEX) 100 MG tablet   Oral   Take 100 mg by mouth at bedtime.           BP 132/55  Pulse 90  Temp(Src) 98.1 F (36.7 C) (Oral)  Resp 21  SpO2 94% Physical Exam  Nursing note and vitals reviewed. Constitutional: She is oriented to person, place, and time. She appears well-developed and well-nourished.  HENT:  Head: Normocephalic.  Mouth/Throat: Oropharynx is clear and moist. No oropharyngeal exudate.  Eyes: Conjunctivae and EOM are normal. Pupils are equal, round, and reactive to light.  Neck: Normal range of motion. Neck supple.  Cardiovascular: Normal rate, regular rhythm, normal heart sounds and intact distal pulses.  Exam reveals no gallop and no friction rub.   No murmur heard. Pulmonary/Chest: She has no wheezes.  Mildly increased work of  breathing. The patient speaks in  short phrases which the family says is not new. Mild crackles heard at the left lung base.  Abdominal: Soft. Bowel sounds are normal. There is no tenderness. There is no rebound and no guarding.  Musculoskeletal: Normal range of motion. She exhibits edema (Mild nonpitting edema in bilateral lower terminates.). She exhibits no tenderness.  Neurological: She is alert and oriented to person, place, and time.  Skin: Skin is warm and dry.  Psychiatric: She has a normal mood and affect. Her behavior is normal.    ED Course  Procedures (including critical care time) Labs Review Labs Reviewed  CBC WITH DIFFERENTIAL - Abnormal; Notable for the following:    WBC 16.8 (*)    Neutrophils Relative % 86 (*)    Lymphocytes Relative 7 (*)    Neutro Abs 14.4 (*)    Monocytes Absolute 1.2 (*)    All other components within normal limits  COMPREHENSIVE METABOLIC PANEL - Abnormal; Notable for the following:    Sodium 132 (*)    Glucose, Bld 204 (*)    Creatinine, Ser 1.38 (*)    Albumin 3.4 (*)    AST 38 (*)    GFR calc non Af Amer 37 (*)    GFR calc Af Amer 43 (*)    All other components within normal limits  PRO B NATRIURETIC PEPTIDE - Abnormal; Notable for the following:    Pro B Natriuretic peptide (BNP) 501.7 (*)    All other components within normal limits  URINALYSIS W MICROSCOPIC + REFLEX CULTURE - Abnormal; Notable for the following:    Color, Urine AMBER (*)    APPearance CLOUDY (*)    Hgb urine dipstick MODERATE (*)    Bilirubin Urine SMALL (*)    All other components within normal limits  CULTURE, BLOOD (ROUTINE X 2)  CULTURE, BLOOD (ROUTINE X 2)  URINE CULTURE  TROPONIN I  PROTIME-INR   Imaging Review Dg Chest 2 View  09/03/2013   CLINICAL DATA:  Cough, shortness of breath.  EXAM: CHEST  2 VIEW  COMPARISON:  September 10, 2008.  FINDINGS: The heart size and mediastinal contours are within normal limits. Mild central pulmonary vascular congestion is noted. No pleural effusion or  pneumothorax is noted. Both lungs are clear. The visualized skeletal structures are unremarkable.  IMPRESSION: Central pulmonary vascular congestion is noted. No other abnormality is noted.   Electronically Signed   By: Roque Lias M.D.   On: 09/03/2013 21:09   Ct Angio Chest Pe W/cm &/or Wo Cm  09/03/2013   CLINICAL DATA:  Short of breath  EXAM: CT ANGIOGRAPHY CHEST WITH CONTRAST  TECHNIQUE: Multidetector CT imaging of the chest was performed using the standard protocol during bolus administration of intravenous contrast. Multiplanar CT image reconstructions including MIPs were obtained to evaluate the vascular anatomy.  CONTRAST:  OMNIPAQUE IOHEXOL 350 MG/ML SOLN  COMPARISON:  Chest 09/03/2013  FINDINGS: Negative for pulmonary embolism. Negative for aortic aneurysm or dissection. Mild corner artery calcification. Negative for pericardial effusion.  Patchy subpleural lung densities bilaterally, consistent with pulmonary scarring and possibly interstitial edema. No significant pleural effusion. Mild dependent atelectasis in the lung bases.  Mild adenopathy. 15 mm anterior mediastinal node. 1 cm precarinal nodes. Mild hilar adenopathy bilaterally.  Review of the MIP images confirms the above findings.  IMPRESSION: Negative for pulmonary embolism.  Probable subpleural scarring versus edema.  Mild mediastinal and hilar adenopathy. These are nonspecific but may be due to  reactive lymph nodes.   Electronically Signed   By: Marlan Palau M.D.   On: 09/03/2013 23:35    EKG Interpretation     Ventricular Rate:  93 PR Interval:  159 QRS Duration: 84 QT Interval:  371 QTC Calculation: 462 R Axis:   2 Text Interpretation:  Sinus rhythm Low voltage, extremity and precordial leads           CRITICAL CARE Performed by: Purvis Sheffield, S Total critical care time: 30 min Critical care time was exclusive of separately billable procedures and treating other patients. Critical care was necessary  to treat or prevent imminent or life-threatening deterioration. Critical care was time spent personally by me on the following activities: development of treatment plan with patient and/or surrogate as well as nursing, discussions with consultants, evaluation of patient's response to treatment, examination of patient, obtaining history from patient or surrogate, ordering and performing treatments and interventions, ordering and review of laboratory studies, ordering and review of radiographic studies, pulse oximetry and re-evaluation of patient's condition.  MDM   1. SOB (shortness of breath)   2. Hypoxia   3. Fever of unknown origin   4. Leukocytosis   5. SIRS (systemic inflammatory response syndrome)    8:15 PM 72 y.o. female who presents with shortness of breath for 3 weeks. The patient was diagnosed with pneumonia by her primary care provider yesterday and started on moxifloxacin. The family checked on her this evening and noted that she had increased worker breathing. The nurses here state that her oxygen saturation upon arrival to 60%. Her vital signs are unremarkable on my exam. The family notes that she did have a fever yesterday and today although she is afebrile here. Will get lab work, repeat chest x-ray and monitor closely.  Elev wbc. Otherwise no obvious source of infection on labs/imaging. No RF's for PE, but will get CTA to r/o PE or pna not seen on cxr. Will tx empirically w/ Vanc/Zosyn.   Critical care time includes repeat evaluations in this pt who meets SIRS criteria and has hypoxia in order to monitor her respiratory status. Focused medical decision making, chart review, discussions with family, and consultation with the hospitalist were also a part of this time.    Junius Argyle, MD 09/04/13 [phone]

## 2013-09-03 NOTE — ED Notes (Signed)
[place] was dx yesterday [place]  for pna.  Today increased shortness of breath.

## 2013-09-03 NOTE — ED Notes (Signed)
Pt ra sats 66% and labored.  Placed on [place] at 6 liters.  Increased to 96%, tapered down to 4l per Frederica.

## 2013-09-04 DIAGNOSIS — R0902 Hypoxemia: Secondary | ICD-10-CM | POA: Diagnosis present

## 2013-09-04 DIAGNOSIS — R509 Fever, unspecified: Secondary | ICD-10-CM

## 2013-09-04 DIAGNOSIS — I509 Heart failure, unspecified: Secondary | ICD-10-CM

## 2013-09-04 DIAGNOSIS — R6510 Systemic inflammatory response syndrome (SIRS) of non-infectious origin without acute organ dysfunction: Secondary | ICD-10-CM

## 2013-09-04 DIAGNOSIS — D72829 Elevated white blood cell count, unspecified: Secondary | ICD-10-CM | POA: Diagnosis present

## 2013-09-04 DIAGNOSIS — I517 Cardiomegaly: Secondary | ICD-10-CM

## 2013-09-04 DIAGNOSIS — R0602 Shortness of breath: Secondary | ICD-10-CM

## 2013-09-04 HISTORY — DX: Hypoxemia: R09.02

## 2013-09-04 HISTORY — DX: Shortness of breath: R06.02

## 2013-09-04 HISTORY — DX: Elevated white blood cell count, unspecified: D72.829

## 2013-09-04 HISTORY — DX: Systemic inflammatory response syndrome (sirs) of non-infectious origin without acute organ dysfunction: R65.10

## 2013-09-04 HISTORY — DX: Fever, unspecified: R50.9

## 2013-09-04 LAB — TSH: TSH: 0.901 u[IU]/mL (ref 0.350–4.500)

## 2013-09-04 LAB — BASIC METABOLIC PANEL
CO2: 23 mEq/L (ref 19–32)
Calcium: 9.8 mg/dL (ref 8.4–10.5)
Creatinine, Ser: 1.52 mg/dL — ABNORMAL HIGH (ref 0.50–1.10)
Glucose, Bld: 127 mg/dL — ABNORMAL HIGH (ref 70–99)
Potassium: 3.6 mEq/L (ref 3.5–5.1)
Sodium: 135 mEq/L (ref 135–145)

## 2013-09-04 LAB — PROCALCITONIN: Procalcitonin: 0.15 ng/mL

## 2013-09-04 LAB — TROPONIN I: Troponin I: <0.3 ng/mL (ref <0.30)

## 2013-09-04 MED ORDER — LEVOTHYROXINE SODIUM 50 MCG PO TABS
50.0000 ug | ORAL_TABLET | Freq: Every day | ORAL | Status: DC
  Administered 2013-09-04 – 2013-09-07 (×4): 50 ug via ORAL
  Filled 2013-09-04 (×5): qty 1

## 2013-09-04 MED ORDER — METOPROLOL TARTRATE 25 MG PO TABS
25.0000 mg | ORAL_TABLET | Freq: Two times a day (BID) | ORAL | Status: DC
  Administered 2013-09-04 – 2013-09-05 (×2): 25 mg via ORAL
  Filled 2013-09-04 (×4): qty 1

## 2013-09-04 MED ORDER — ONDANSETRON HCL 4 MG PO TABS: 4.0000 mg | ORAL_TABLET | Freq: Four times a day (QID) | ORAL | Status: DC | PRN

## 2013-09-04 MED ORDER — PANTOPRAZOLE SODIUM 40 MG PO TBEC
40.0000 mg | DELAYED_RELEASE_TABLET | Freq: Every day | ORAL | Status: DC
  Administered 2013-09-04 – 2013-09-07 (×4): 40 mg via ORAL
  Filled 2013-09-04 (×4): qty 1

## 2013-09-04 MED ORDER — FENOFIBRATE 160 MG PO TABS
160.0000 mg | ORAL_TABLET | Freq: Every day | ORAL | Status: DC
  Administered 2013-09-04 – 2013-09-07 (×4): 160 mg via ORAL
  Filled 2013-09-04 (×4): qty 1

## 2013-09-04 MED ORDER — METOPROLOL TARTRATE 12.5 MG HALF TABLET
12.5000 mg | ORAL_TABLET | Freq: Two times a day (BID) | ORAL | Status: DC
  Administered 2013-09-04: 12.5 mg via ORAL
  Filled 2013-09-04 (×3): qty 1

## 2013-09-04 MED ORDER — FLUTICASONE PROPIONATE 50 MCG/ACT NA SUSP
2.0000 | Freq: Every day | NASAL | Status: DC
  Administered 2013-09-04 – 2013-09-07 (×4): 2 via NASAL
  Filled 2013-09-04: qty 16

## 2013-09-04 MED ORDER — ASPIRIN-DIPYRIDAMOLE ER 25-200 MG PO CP12
1.0000 | ORAL_CAPSULE | Freq: Two times a day (BID) | ORAL | Status: DC
  Administered 2013-09-04 – 2013-09-07 (×7): 1 via ORAL
  Filled 2013-09-04 (×9): qty 1

## 2013-09-04 MED ORDER — RAMIPRIL 10 MG PO CAPS
10.0000 mg | ORAL_CAPSULE | Freq: Every day | ORAL | Status: DC
  Filled 2013-09-04: qty 1

## 2013-09-04 MED ORDER — VANCOMYCIN HCL IN DEXTROSE 1-5 GM/200ML-% IV SOLN
1000.0000 mg | Freq: Once | INTRAVENOUS | Status: AC
  Administered 2013-09-04: 1000 mg via INTRAVENOUS
  Filled 2013-09-04: qty 200

## 2013-09-04 MED ORDER — FUROSEMIDE 10 MG/ML IJ SOLN
20.0000 mg | Freq: Two times a day (BID) | INTRAMUSCULAR | Status: DC
  Administered 2013-09-04 – 2013-09-05 (×4): 20 mg via INTRAVENOUS
  Filled 2013-09-04 (×5): qty 2
  Filled 2013-09-04: qty 4

## 2013-09-04 MED ORDER — SODIUM CHLORIDE 0.9 % IJ SOLN
3.0000 mL | Freq: Two times a day (BID) | INTRAMUSCULAR | Status: DC
  Administered 2013-09-04 – 2013-09-07 (×3): 3 mL via INTRAVENOUS

## 2013-09-04 MED ORDER — FA-PYRIDOXINE-CYANOCOBALAMIN 2.5-25-2 MG PO TABS
1.0000 | ORAL_TABLET | Freq: Every day | ORAL | Status: DC
  Administered 2013-09-04 – 2013-09-07 (×4): 1 via ORAL
  Filled 2013-09-04 (×4): qty 1

## 2013-09-04 MED ORDER — PIPERACILLIN-TAZOBACTAM 3.375 G IVPB 30 MIN
3.3750 g | Freq: Three times a day (TID) | INTRAVENOUS | Status: DC
  Administered 2013-09-04 – 2013-09-06 (×7): 3.375 g via INTRAVENOUS
  Filled 2013-09-04 (×8): qty 50

## 2013-09-04 MED ORDER — ATORVASTATIN CALCIUM 40 MG PO TABS
40.0000 mg | ORAL_TABLET | Freq: Every day | ORAL | Status: DC
  Administered 2013-09-04 – 2013-09-06 (×3): 40 mg via ORAL
  Filled 2013-09-04 (×4): qty 1

## 2013-09-04 MED ORDER — ACETAMINOPHEN 325 MG PO TABS
650.0000 mg | ORAL_TABLET | Freq: Four times a day (QID) | ORAL | Status: DC | PRN
  Administered 2013-09-04 – 2013-09-06 (×5): 650 mg via ORAL
  Filled 2013-09-04 (×6): qty 2

## 2013-09-04 MED ORDER — ONDANSETRON HCL 4 MG/2ML IJ SOLN: 4.0000 mg | Freq: Four times a day (QID) | INTRAMUSCULAR | Status: DC | PRN

## 2013-09-04 MED ORDER — ENOXAPARIN SODIUM 40 MG/0.4ML ~~LOC~~ SOLN
40.0000 mg | SUBCUTANEOUS | Status: DC
  Administered 2013-09-04 – 2013-09-05 (×2): 40 mg via SUBCUTANEOUS
  Filled 2013-09-04 (×2): qty 0.4

## 2013-09-04 MED ORDER — METOPROLOL TARTRATE 1 MG/ML IV SOLN
5.0000 mg | INTRAVENOUS | Status: DC | PRN
  Administered 2013-09-04: 5 mg via INTRAVENOUS
  Filled 2013-09-04: qty 5

## 2013-09-04 MED ORDER — ACETAMINOPHEN 650 MG RE SUPP: 650.0000 mg | Freq: Four times a day (QID) | RECTAL | Status: DC | PRN

## 2013-09-04 MED ORDER — MAGNESIUM OXIDE 400 (241.3 MG) MG PO TABS
400.0000 mg | ORAL_TABLET | Freq: Every day | ORAL | Status: DC
  Administered 2013-09-04 – 2013-09-07 (×4): 400 mg via ORAL
  Filled 2013-09-04 (×4): qty 1

## 2013-09-04 NOTE — Progress Notes (Signed)
Echocardiogram 2D Echocardiogram has been performed.  Hy Swiatek 09/04/2013, 11:34 AM

## 2013-09-04 NOTE — Progress Notes (Signed)
Pt's HR sustaining in the 150s-160s. Pt resting in bed, states she feels tired, but no other s/s. Pt's VSS. MD made aware. New orders given. 2000 dose of p.o. Metoprolol given, as well as 5mg  IV per MD order. Meds given. Will continue to monitor pt closely.

## 2013-09-04 NOTE — Progress Notes (Signed)
Pt seen and examined, admitted this am per Dr.Tat, agree with A&P -Im leaning more towards early pneumonia/bronchitis with SIRs, component of CHF possible too -continue IV Abx, low dose lasix, FU ECHO -FU cultures -I/Os  Zannie Cove, MD [phone]

## 2013-09-04 NOTE — H&P (Addendum)
[facility] History and Physical  April Le ZOX:[id number] DOB: December 03, 1940 DOA: 09/03/2013   PCP: Marylen Ponto, MD   Chief Complaint: Worsening dyspnea  HPI:  71 year old female with history of hypertension, stroke, hyperlipidemia, and CKD stage III presents with 3 weeks of progressive shortness of breath. The patient states that she has had decreasing endurance and increasing dyspnea on exertion when walking short distances. In addition, she also complains of paroxysmal nocturnal dyspnea which has been more frequent in the past 3 weeks. There has been peripheral lower extremity edema, but she states that this has not significantly worsened in the past 3 weeks. In addition, the patient experienced a temperature of 100.59F on the day prior to admission. She went to see her primary care physician yesterday during which time the patient had a fever of 101.91F in the office. The patient was started on moxifloxacin. She has only taken one dose. The patient's shortness breath did not improve. The patient's daughter thought that she was more labored. As a result, the patient was brought to the emergency department for further evaluation. The patient has had increasing cough with yellow and white sputum. There is no hemoptysis, nausea, vomiting, diarrhea, abdominal pain, dysuria, hematuria, rashes. She denies any chest discomfort, dizziness, syncope headache.  In the emergency department, the patient was noted to have oxygen saturation in the 70s at the time of arrival. The patient was placed on 4 L nasal cannula with oxygen saturations improving up to 95-96%. The patient has not had any respiratory distress. CT angiogram of the chest was performed and was negative for pulmonary embolus but showed patchy subpleural lung densities concerning for scarring versus interstitial edema. There is also shotty mediastinal and precarinal lymphadenopathy. Chest x-ray showed mild central pulmonary vascular  congestion with interstitial markings. ProBNP was 501. WBC was 16.8. BMP showed sodium 132. Serum creatinine was 1.22. Urinalysis was negative for pyuria. EKG showed sinus rhythm without ST to T wave changes. In ED, the patient was given 500 cc of IV fluid. Assessment/Plan: Acute CHF -May have been precipitated by the patient's infectious process -Clinically, the patient appears hypervolemic with peripheral edema and JVD -Start furosemide 20 mg IV twice a day -Echocardiogram -Strict I.'s andOs -Check TSH -cycle troponins Fever/SIRS/leukocytosis -Etiology is unclear at this time -Urinalysis did not show pyuria -Considerations include bronchitis/developing pneumonia, bacteremia -Blood cultures x2 sets have been obtained in the emergency department -Continue empiric vancomycin and Zosyn pending culture data -Check procalcitonin Hypertension -Discontinue amlodipine and clonidine as the patient's blood pressure was in 120s in ED, and I expect her blood pressure may drop with diuresis -Continue metoprolol tartrate at a lower dose -Continue ramipril CKD stage III -Baseline creatinine 1.3 -Monitor closely with diuresis History of stroke -Patient has residual dysarthria -Continue Aggrenox       Past Medical History  Diagnosis Date  . Hypothyroidism   . GERD (gastroesophageal reflux disease)   . Hypercholesteremia   . Headache(784.0)   . Arthritis     right knee  . 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 Urinary incontinence   . Abdominal distention     past two months  . Weakness of left leg     Mildly, post stroke  . Hypertension   . Stroke 02/2007, 08/2009    2 total -RESIDUAL WEAKNESS LEFT LEG--AND APHASIA  . Shortness of breath     WITH EXERTION  . Sinus drainage     PT STARTED ANTIBIOTIC 11/17/11 --IS HOARSE TODAY, SOME WHEEZING AND  COUGH WITH YELLOW DRAINAGE  . Chronic kidney disease     STAGE III KIDNEY DISEASE-PER PT--PT SEES DR. Isabel Caprice UROLOGIST  . Complication of anesthesia      PT REMEMBERS BREATHING PROBLEMS WAKING UP FROM KNEE SURGERY AT [facility] 2011 OR 2012   Past Surgical History  Procedure Laterality Date  . Abdominal hysterectomy    . Tubal ligation    . Cholecystectomy    . Meniscus repair    . Tonsillectomy    . Toe surgery    . Laparotomy  11/22/2011    Procedure: EXPLORATORY LAPAROTOMY;  Surgeon: Rejeana Brock A. Duard Brady, MD;  Location: [facility];  Service: Gynecology;  Laterality: N/A;  . Salpingoophorectomy  11/22/2011    Procedure: SALPINGO OOPHERECTOMY;  Surgeon: Rejeana Brock A. Duard Brady, MD;  Location: [facility];  Service: Gynecology;  Laterality: Bilateral;   Social History:  reports that she quit smoking about 53 years ago. She does not have any smokeless tobacco history on file. She reports that she does not drink alcohol or use illicit drugs.   Family History  Problem Relation Age of Onset  . Kidney failure Mother   . Heart attack Father   . Heart failure Father   . Stomach cancer Other      Allergies  Allergen Reactions  . Amoxicillin-Pot Clavulanate     GI INTOLERANCE ONLY WITH DIARRHEA      Prior to Admission medications   Medication Sig Start Date End Date Taking? Authorizing Provider  amLODipine (NORVASC) 10 MG tablet Take 10 mg by mouth daily with breakfast.    Yes Historical Provider, MD  aspirin 81 MG tablet Take 81 mg by mouth daily with breakfast.    Yes Historical Provider, MD  Aspirin-Dipyridamole (AGGRENOX PO) Take 60 mg by mouth 2 (two) times daily. 60 MG at 9am and 5pm   Yes Historical Provider, MD  atorvastatin (LIPITOR) 40 MG tablet Take 40 mg by mouth at bedtime.    Yes Historical Provider, MD  cloNIDine (CATAPRES) 0.1 MG tablet Take 0.1 mg by mouth 2 (two) times daily.    Yes Historical Provider, MD  FA-Pyridoxine-Cyancobalamin (FOLTX PO) Take 1 tablet by mouth daily.    Yes Historical Provider, MD  fenofibrate (TRICOR) 145 MG tablet Take 145 mg by mouth daily with breakfast.    Yes Historical Provider, MD   levothyroxine (SYNTHROID, LEVOTHROID) 50 MCG tablet Take 50 mcg by mouth daily at 6 (six) AM.    Yes Historical Provider, MD  magnesium oxide (MAG-OX) 400 MG tablet Take 400 mg by mouth daily.    Yes Historical Provider, MD  metoprolol (LOPRESSOR) 50 MG tablet Take 50 mg by mouth 2 (two) times daily. One at 9 am and at 8pm   Yes Historical Provider, MD  mometasone (NASONEX) 50 MCG/ACT nasal spray Place 2 sprays into the nose daily as needed. ALLERGIES     Yes Historical Provider, MD  moxifloxacin (AVELOX) 400 MG tablet Take 400 mg by mouth daily.   Yes Historical Provider, MD  pantoprazole (PROTONIX) 40 MG tablet Take 40 mg by mouth daily.    Yes Historical Provider, MD  ramipril (ALTACE) 10 MG capsule Take 10 mg by mouth 2 (two) times daily.   Yes Historical Provider, MD    Review of Systems:  Constitutional:  No weight loss, night sweats Head&Eyes: No headache.  No vision loss.  No eye pain or scotoma ENT:  No Difficulty swallowing,Tooth/dental problems,Sore throat,   Cardio-vascular:  No chest pain, Orthopnea,  dizziness, palpitations  GI:  No  abdominal pain, nausea, vomiting, diarrhea, loss of appetite, hematochezia, melena Resp:  No coughing up of blood .No wheezing. Skin:  no rash or lesions.  GU:  no dysuria, change in color of urine, no urgency or frequency.  Musculoskeletal:  No joint pain or swelling. No decreased range of motion.   Psych:  No change in mood or affect. No depression or anxiety. Neurologic: No headache, no dysesthesia, no focal weakness, no vision loss. No syncope  Physical Exam: Filed Vitals:   09/03/13 2230 09/03/13 2330 09/03/13 2348 09/04/13 0015  BP: 114/57 123/52    Pulse: 77 81  83  Temp:      TempSrc:      Resp: 23 15  17   SpO2: 93% 91% 97% 89%   General:  A&O x 3, NAD, nontoxic, pleasant/cooperative Head/Eye: No conjunctival hemorrhage, no icterus, [place]/AT, No nystagmus ENT:  No icterus,  No thrush, good dentition, no pharyngeal  exudate Neck:  No masses, no lymphadenpathy, no bruits; positive JVD CV:  RRR, no rub, no gallop, no S3 Lung:  Bibasilar crackles. No wheezing. Good air movement. Abdomen: soft/NT, +BS, nondistended, no peritoneal signs Ext: No cyanosis, No rashes, No petechiae, No lymphangitis, 1+ lower extremity edema   Labs on Admission:  Basic Metabolic Panel:  Recent Labs Lab 09/03/13 2005  NA 132*  K 3.6  CL 99  CO2 21  GLUCOSE 204*  BUN 18  CREATININE 1.38*  CALCIUM 9.8   Liver Function Tests:  Recent Labs Lab 09/03/13 2005  AST 38*  ALT 24  ALKPHOS 75  BILITOT 0.8  PROT 7.5  ALBUMIN 3.4*   No results found for this basename: LIPASE, AMYLASE,  in the last 168 hours No results found for this basename: AMMONIA,  in the last 168 hours CBC:  Recent Labs Lab 09/03/13 2005  WBC 16.8*  NEUTROABS 14.4*  HGB 12.8  HCT 37.6  MCV 92.4  PLT 251   Cardiac Enzymes:  Recent Labs Lab 09/03/13 2005  TROPONINI <0.30   BNP: No components found with this basename: POCBNP,  CBG: No results found for this basename: GLUCAP,  in the last 168 hours  Radiological Exams on Admission: Dg Chest 2 View  09/03/2013   CLINICAL DATA:  Cough, shortness of breath.  EXAM: CHEST  2 VIEW  COMPARISON:  September 10, 2008.  FINDINGS: The heart size and mediastinal contours are within normal limits. Mild central pulmonary vascular congestion is noted. No pleural effusion or pneumothorax is noted. Both lungs are clear. The visualized skeletal structures are unremarkable.  IMPRESSION: Central pulmonary vascular congestion is noted. No other abnormality is noted.   Electronically Signed   By: Roque Lias M.D.   On: 09/03/2013 21:09   Ct Angio Chest Pe W/cm &/or Wo Cm  09/03/2013   CLINICAL DATA:  Short of breath  EXAM: CT ANGIOGRAPHY CHEST WITH CONTRAST  TECHNIQUE: Multidetector CT imaging of the chest was performed using the standard protocol during bolus administration of intravenous contrast.  Multiplanar CT image reconstructions including MIPs were obtained to evaluate the vascular anatomy.  CONTRAST:  OMNIPAQUE IOHEXOL 350 MG/ML SOLN  COMPARISON:  Chest 09/03/2013  FINDINGS: Negative for pulmonary embolism. Negative for aortic aneurysm or dissection. Mild corner artery calcification. Negative for pericardial effusion.  Patchy subpleural lung densities bilaterally, consistent with pulmonary scarring and possibly interstitial edema. No significant pleural effusion. Mild dependent atelectasis in the lung bases.  Mild adenopathy. 15 mm anterior  mediastinal node. 1 cm precarinal nodes. Mild hilar adenopathy bilaterally.  Review of the MIP images confirms the above findings.  IMPRESSION: Negative for pulmonary embolism.  Probable subpleural scarring versus edema.  Mild mediastinal and hilar adenopathy. These are nonspecific but may be due to reactive lymph nodes.   Electronically Signed   By: Marlan Palau M.D.   On: 09/03/2013 23:35    EKG: Independently reviewed. Sinus rhythm, no ST-T wave change    Time spent:70 minutes Code Status:  FULL Family Communication:   Husband and daughter at bedside   Devra Stare, DO  [facility] Pager [phone]  If 7PM-7AM, please contact night-coverage [email or web address] Password [email or web address] 09/04/2013, 1:13 AM

## 2013-09-04 NOTE — Progress Notes (Signed)
ANTIBIOTIC CONSULT NOTE - INITIAL  Pharmacy Consult for Vancomycin Indication: pneumonia  Allergies  Allergen Reactions  . Amoxicillin-Pot Clavulanate     GI INTOLERANCE ONLY WITH DIARRHEA    Patient Measurements: Height: 5' (152.4 cm) Weight: 180 lb 12.8 oz (82.01 kg) IBW/kg (Calculated) : 45.5 Adjusted Body Weight:   Vital Signs: Temp: 98 F (36.7 C) (10/22 0530) Temp src: Oral (10/22 0530) BP: 130/76 mmHg (10/22 0530) Pulse Rate: 80 (10/22 0530) Intake/Output from previous day: 10/21 0701 - 10/22 0700 In: 3 [I.V.:3] Out: 550 [Urine:550] Intake/Output from this shift: Total I/O In: 3 [I.V.:3] Out: 550 [Urine:550]  Labs:  Recent Labs  09/03/13 2005 09/04/13 0235  WBC 16.8*  --   HGB 12.8  --   PLT 251  --   CREATININE 1.38* 1.52*   Estimated Creatinine Clearance: 31.3 ml/min (by C-G formula based on Cr of 1.52). No results found for this basename: VANCOTROUGH, VANCOPEAK, VANCORANDOM, GENTTROUGH, GENTPEAK, GENTRANDOM, TOBRATROUGH, TOBRAPEAK, TOBRARND, AMIKACINPEAK, AMIKACINTROU, AMIKACIN,  in the last 72 hours   Microbiology: No results found for this or any previous visit (from the past 720 hour(s)).  Medical History: Past Medical History  Diagnosis Date  . Hypothyroidism   . GERD (gastroesophageal reflux disease)   . Hypercholesteremia   . Headache(784.0)   . Arthritis     right knee  . 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 Urinary incontinence   . Abdominal distention     past two months  . Weakness of left leg     Mildly, post stroke  . Hypertension   . Stroke 02/2007, 08/2009    2 total -RESIDUAL WEAKNESS LEFT LEG--AND APHASIA  . Shortness of breath     WITH EXERTION  . Sinus drainage     PT STARTED ANTIBIOTIC 11/17/11 --IS HOARSE TODAY, SOME WHEEZING AND COUGH WITH YELLOW DRAINAGE  . Chronic kidney disease     STAGE III KIDNEY DISEASE-PER PT--PT SEES DR. Isabel Caprice UROLOGIST  . Complication of anesthesia     PT REMEMBERS BREATHING PROBLEMS WAKING UP FROM  KNEE SURGERY AT [facility] 2011 OR 2012    Medications:  Anti-infectives   Start     Dose/Rate Route Frequency Ordered Stop   09/04/13 0600  piperacillin-tazobactam (ZOSYN) IVPB 3.375 g     3.375 g 12.5 mL/hr over 240 Minutes Intravenous 3 times per day 09/04/13 0205     09/04/13 0230  vancomycin (VANCOCIN) IVPB 1000 mg/200 mL premix     1,000 mg 200 mL/hr over 60 Minutes Intravenous  Once 09/04/13 0220 09/04/13 0442   09/03/13 2315  vancomycin (VANCOCIN) IVPB 1000 mg/200 mL premix     1,000 mg 200 mL/hr over 60 Minutes Intravenous  Once 09/03/13 2312 09/04/13 0133   09/03/13 2315  piperacillin-tazobactam (ZOSYN) IVPB 3.375 g     3.375 g 100 mL/hr over 30 Minutes Intravenous  Once 09/03/13 2312 09/04/13 0022     Assessment: Patient with PNA. Patient got vancomycin 1gm iv x1 in ED and then another 1gm load on floor.  Patient will not need another dose at this time.  Will check level with 10/23 am labs  Goal of Therapy:  Vancomycin trough level 15-20 mcg/ml  Plan:  Measure antibiotic drug levels at steady state Follow up culture results No vancomycin at this time, follow up with 10/23 am vancomycin random level and dose  Darlina Guys, Cieara Stierwalt Crowford 09/04/2013,6:10 AM

## 2013-09-05 LAB — BASIC METABOLIC PANEL
BUN: 18 mg/dL (ref 6–23)
Calcium: 10.1 mg/dL (ref 8.4–10.5)
Creatinine, Ser: 1.66 mg/dL — ABNORMAL HIGH (ref 0.50–1.10)
GFR calc non Af Amer: 30 mL/min — ABNORMAL LOW (ref >90)
Glucose, Bld: 124 mg/dL — ABNORMAL HIGH (ref 70–99)
Sodium: 140 mEq/L (ref 135–145)

## 2013-09-05 LAB — CBC
HCT: 41.4 % (ref 36.0–46.0)
Hemoglobin: 13.7 g/dL (ref 12.0–15.0)
MCH: 30.9 pg (ref 26.0–34.0)
MCHC: 33.1 g/dL (ref 30.0–36.0)
MCV: 93.2 fL (ref 78.0–100.0)
WBC: 10.3 10*3/uL (ref 4.0–10.5)

## 2013-09-05 LAB — URINE CULTURE
Colony Count: NO GROWTH
Culture: NO GROWTH

## 2013-09-05 MED ORDER — POTASSIUM CHLORIDE CRYS ER 20 MEQ PO TBCR
40.0000 meq | EXTENDED_RELEASE_TABLET | Freq: Once | ORAL | Status: AC
  Administered 2013-09-05: 11:00:00 40 meq via ORAL
  Filled 2013-09-05: qty 2

## 2013-09-05 MED ORDER — FUROSEMIDE 20 MG PO TABS
20.0000 mg | ORAL_TABLET | Freq: Every day | ORAL | Status: DC
  Administered 2013-09-06 – 2013-09-07 (×2): 20 mg via ORAL
  Filled 2013-09-05 (×3): qty 1

## 2013-09-05 MED ORDER — METOPROLOL TARTRATE 50 MG PO TABS
50.0000 mg | ORAL_TABLET | Freq: Two times a day (BID) | ORAL | Status: DC
  Administered 2013-09-05 – 2013-09-07 (×4): 50 mg via ORAL
  Filled 2013-09-05 (×6): qty 1

## 2013-09-05 MED ORDER — ENOXAPARIN SODIUM 30 MG/0.3ML ~~LOC~~ SOLN
30.0000 mg | SUBCUTANEOUS | Status: DC
  Administered 2013-09-06 – 2013-09-07 (×2): 30 mg via SUBCUTANEOUS
  Filled 2013-09-05 (×2): qty 0.3

## 2013-09-05 MED ORDER — VANCOMYCIN HCL IN DEXTROSE 1-5 GM/200ML-% IV SOLN
1000.0000 mg | INTRAVENOUS | Status: DC
  Administered 2013-09-05: 08:00:00 1000 mg via INTRAVENOUS
  Filled 2013-09-05 (×2): qty 200

## 2013-09-05 NOTE — Progress Notes (Signed)
Patient had one episodes over night where heart rate went up to 152 at 00:34 but did not sustain. Patient had no complaints over night.  Will continue to monitor patient.

## 2013-09-05 NOTE — Progress Notes (Signed)
[facility] PROGRESS NOTE  April Le ZOX:[id number] DOB: 10-03-1941 DOA: 09/03/2013 PCP: Marylen Ponto, MD  Assessment/Plan:   Early pneumonia/Bronchitis with SIRs  -improving -Blood cultures x2 negative so far -continue IV abx today and transition to PO tomorrow  Acute Diastolic CHF  -May have been precipitated by the patient's infectious process  -improved with IV lasix, I/Os inaccurate -change to Po lasix today -Echocardiogram noted, normal EF/Grade 1 diastolic Dysfunction - TSH ok -cardiac enzymes normal -hold ACE since, creatinine bumped  Hypertension  -increased metorpolol, due to SVT yesterday - ramipril on hold since creatinine up  CKD stage III  -Baseline creatinine 1.3  -slightly worse with diuresis   History of stroke  -Patient has residual dysarthria  -Continue Aggrenox    Code Status: Full Family Communication: d/w son at bedside Disposition Plan: home in 1-2days     HPI/Subjective: Breathing better, slept well, poor appetite  Objective: Filed Vitals:   09/05/13 0513  BP: 146/56  Pulse: 83  Temp: 98.3 F (36.8 C)  Resp: 18    Intake/Output Summary (Last 24 hours) at 09/05/13 1151 Last data filed at 09/05/13 0900  Gross per 24 hour  Intake    750 ml  Output    376 ml  Net    374 ml   Filed Weights   09/04/13 0220 09/05/13 0500  Weight: 82.01 kg (180 lb 12.8 oz) 79 kg (174 lb 2.6 oz)    Exam:   General:  AAOx3, dysarthric  Cardiovascular: S1S2/RRR  Respiratory: ronchi at bases  Abdomen: soft, Nt, BS present  Musculoskeletal: no edema c/c   Data Reviewed: Basic Metabolic Panel:  Recent Labs Lab 09/03/13 2005 09/04/13 0235 09/05/13 0530  NA 132* 135 140  K 3.6 3.6 3.3*  CL 99 100 99  CO2 21 23 23   GLUCOSE 204* 127* 124*  BUN 18 19 18   CREATININE 1.38* 1.52* 1.66*  CALCIUM 9.8 9.8 10.1   Liver Function Tests:  Recent Labs Lab 09/03/13 2005  AST 38*  ALT 24  ALKPHOS 75  BILITOT 0.8  PROT 7.5   ALBUMIN 3.4*   No results found for this basename: LIPASE, AMYLASE,  in the last 168 hours No results found for this basename: AMMONIA,  in the last 168 hours CBC:  Recent Labs Lab 09/03/13 2005 09/05/13 0530  WBC 16.8* 10.3  NEUTROABS 14.4*  --   HGB 12.8 13.7  HCT 37.6 41.4  MCV 92.4 93.2  PLT 251 175   Cardiac Enzymes:  Recent Labs Lab 09/03/13 2005 09/04/13 0235 09/04/13 0815  TROPONINI <0.30 <0.30 <0.30   BNP (last 3 results)  Recent Labs  09/03/13 2005  PROBNP 501.7*   CBG: No results found for this basename: GLUCAP,  in the last 168 hours  Recent Results (from the past 240 hour(s))  URINE CULTURE     Status: None   Collection Time    09/03/13 10:08 PM      Result Value Range Status   Specimen Description URINE, CATHETERIZED   Final   Special Requests NONE   Final   Culture  Setup Time     Final   Value: 09/04/2013 06:05     Performed at [facility] Count     Final   Value: NO GROWTH     Performed at [facility]   Culture     Final   Value: NO GROWTH     Performed at [facility]  Report Status 09/05/2013 FINAL   Final  CULTURE, BLOOD (ROUTINE X 2)     Status: None   Collection Time    09/03/13 10:53 PM      Result Value Range Status   Specimen Description BLOOD LEFT HAND   Final   Special Requests BOTTLES DRAWN AEROBIC AND ANAEROBIC 5CC   Final   Culture  Setup Time     Final   Value: 09/04/2013 05:53     Performed at [facility]   Culture     Final   Value:        BLOOD CULTURE RECEIVED NO GROWTH TO DATE CULTURE WILL BE HELD FOR 5 DAYS BEFORE ISSUING A FINAL NEGATIVE REPORT     Performed at [facility]   Report Status PENDING   Incomplete  CULTURE, BLOOD (ROUTINE X 2)     Status: None   Collection Time    09/03/13 11:01 PM      Result Value Range Status   Specimen Description BLOOD RIGHT HAND   Final   Special Requests BOTTLES DRAWN AEROBIC AND ANAEROBIC 5CC   Final   Culture  Setup  Time     Final   Value: 09/04/2013 05:54     Performed at [facility]   Culture     Final   Value:        BLOOD CULTURE RECEIVED NO GROWTH TO DATE CULTURE WILL BE HELD FOR 5 DAYS BEFORE ISSUING A FINAL NEGATIVE REPORT     Performed at [facility]   Report Status PENDING   Incomplete     Studies: Dg Chest 2 View  09/03/2013   CLINICAL DATA:  Cough, shortness of breath.  EXAM: CHEST  2 VIEW  COMPARISON:  September 10, 2008.  FINDINGS: The heart size and mediastinal contours are within normal limits. Mild central pulmonary vascular congestion is noted. No pleural effusion or pneumothorax is noted. Both lungs are clear. The visualized skeletal structures are unremarkable.  IMPRESSION: Central pulmonary vascular congestion is noted. No other abnormality is noted.   Electronically Signed   By: Roque Lias M.D.   On: 09/03/2013 21:09   Ct Angio Chest Pe W/cm &/or Wo Cm  09/03/2013   CLINICAL DATA:  Short of breath  EXAM: CT ANGIOGRAPHY CHEST WITH CONTRAST  TECHNIQUE: Multidetector CT imaging of the chest was performed using the standard protocol during bolus administration of intravenous contrast. Multiplanar CT image reconstructions including MIPs were obtained to evaluate the vascular anatomy.  CONTRAST:  OMNIPAQUE IOHEXOL 350 MG/ML SOLN  COMPARISON:  Chest 09/03/2013  FINDINGS: Negative for pulmonary embolism. Negative for aortic aneurysm or dissection. Mild corner artery calcification. Negative for pericardial effusion.  Patchy subpleural lung densities bilaterally, consistent with pulmonary scarring and possibly interstitial edema. No significant pleural effusion. Mild dependent atelectasis in the lung bases.  Mild adenopathy. 15 mm anterior mediastinal node. 1 cm precarinal nodes. Mild hilar adenopathy bilaterally.  Review of the MIP images confirms the above findings.  IMPRESSION: Negative for pulmonary embolism.  Probable subpleural scarring versus edema.  Mild mediastinal and  hilar adenopathy. These are nonspecific but may be due to reactive lymph nodes.   Electronically Signed   By: Marlan Palau M.D.   On: 09/03/2013 23:35    Scheduled Meds: . atorvastatin  40 mg Oral QHS  . dipyridamole-aspirin  1 capsule Oral BID AC  . enoxaparin (LOVENOX) injection  40 mg Subcutaneous Q24H  . fenofibrate  160  mg Oral Daily  . fluticasone  2 spray Each Nare Daily  . folic acid-pyridoxine-cyancobalamin  1 tablet Oral Daily  . furosemide  20 mg Oral Daily  . levothyroxine  50 mcg Oral Q0600  . magnesium oxide  400 mg Oral Daily  . metoprolol tartrate  25 mg Oral BID  . pantoprazole  40 mg Oral Daily  . piperacillin-tazobactam  3.375 g Intravenous Q8H  . sodium chloride  3 mL Intravenous Q12H  . vancomycin  1,000 mg Intravenous Q24H   Continuous Infusions:   Active Problems:   Leukocytosis   Fever of unknown origin   Hypoxia   SOB (shortness of breath)   SIRS (systemic inflammatory response syndrome)   Acute CHF    Time spent:    [facility]  [facility] Pager [phone]. If 7PM-7AM, please contact night-coverage at [email or web address], password [facility] 09/05/2013, 11:51 AM  LOS: 2 days

## 2013-09-05 NOTE — Progress Notes (Signed)
Rx Brief Vancomycin note for PNA  Assessement:  Random vanc= 13.7 after 2Gm dose on 10/22 (1Gm 0021, 1Gm 0342)  SCr still rising  Plan:  Vancomycin 1Gm IV q24h  F/u SCr/levels/cultures as needed.  Lorenza Evangelist 09/05/2013 7:06 AM

## 2013-09-05 NOTE — Progress Notes (Addendum)
Pt's O2 sat resting on RN - 80-85% Pt's O2 sat resting on 3L - 90/95% Pt's O2 sat ambulating on 3L - 84-89% Pt's O2 sat ambulating on 4L - 90-95% After return from ambulating: Pt's O2 sat resting on 3L - 90-95%.   Pt left resting in bed on 3L of O2. Will continue to monitor.

## 2013-09-06 DIAGNOSIS — R6510 Systemic inflammatory response syndrome (SIRS) of non-infectious origin without acute organ dysfunction: Secondary | ICD-10-CM

## 2013-09-06 LAB — CBC
Hemoglobin: 12.9 g/dL (ref 12.0–15.0)
MCH: 30.8 pg (ref 26.0–34.0)
MCHC: 33.1 g/dL (ref 30.0–36.0)
MCV: 93.1 fL (ref 78.0–100.0)
Platelets: 279 10*3/uL (ref 150–400)
RDW: 12.9 % (ref 11.5–15.5)

## 2013-09-06 LAB — BASIC METABOLIC PANEL
BUN: 19 mg/dL (ref 6–23)
CO2: 26 mEq/L (ref 19–32)
Calcium: 9.9 mg/dL (ref 8.4–10.5)
Chloride: 100 mEq/L (ref 96–112)
Creatinine, Ser: 1.63 mg/dL — ABNORMAL HIGH (ref 0.50–1.10)
GFR calc non Af Amer: 30 mL/min — ABNORMAL LOW (ref >90)
Glucose, Bld: 126 mg/dL — ABNORMAL HIGH (ref 70–99)
Potassium: 3.4 mEq/L — ABNORMAL LOW (ref 3.5–5.1)
Sodium: 139 mEq/L (ref 135–145)

## 2013-09-06 LAB — PROCALCITONIN: Procalcitonin: <0.1 ng/mL

## 2013-09-06 MED ORDER — LEVOFLOXACIN 750 MG PO TABS
750.0000 mg | ORAL_TABLET | ORAL | Status: DC
  Administered 2013-09-06: 10:00:00 750 mg via ORAL
  Filled 2013-09-06: qty 1

## 2013-09-06 NOTE — Progress Notes (Signed)
[facility] PROGRESS NOTE  April Le WUJ:[id number] DOB: Dec 28, 1940 DOA: 09/03/2013 PCP: Marylen Ponto, MD  Assessment/Plan:   Early pneumonia/Bronchitis with SIRs  -improving -Blood cultures x2 negative so far -Day 3 of continue IV abx today,  -transition to PO levaquin today -wean O2  Acute Diastolic CHF  -May have been precipitated by the patient's infectious process  -improved with IV lasix, I/Os inaccurate -changed to Po lasix 10/23 -Echocardiogram noted, normal EF/Grade 1 diastolic Dysfunction - TSH ok -cardiac enzymes normal -hold ACE since, creatinine bumped  Hypertension  -increased metorpolol, due to SVT yesterday - ramipril on hold since creatinine up  CKD stage III  -Baseline creatinine 1.3  -slightly worse with diuresis but stable now   History of stroke  -Patient has residual dysarthria  -Continue Aggrenox    Code Status: Full Family Communication: d/w son at bedside Disposition Plan: home tomorrow     HPI/Subjective: Breathing better, slept well, poor appetite   Objective: Filed Vitals:   09/06/13 1440  BP: 139/62  Pulse: 76  Temp: 98.1 F (36.7 C)  Resp: 18    Intake/Output Summary (Last 24 hours) at 09/06/13 1459 Last data filed at 09/06/13 1300  Gross per 24 hour  Intake   1140 ml  Output    100 ml  Net   1040 ml   Filed Weights   09/04/13 0220 09/05/13 0500 09/06/13 0500  Weight: 82.01 kg (180 lb 12.8 oz) 79 kg (174 lb 2.6 oz) 79.379 kg (175 lb)    Exam:   General:  AAOx3, dysarthric  Cardiovascular: S1S2/RRR  Respiratory: ronchi at bases  Abdomen: soft, Nt, BS present  Musculoskeletal: no edema c/c   Data Reviewed: Basic Metabolic Panel:  Recent Labs Lab 09/03/13 2005 09/04/13 0235 09/05/13 0530 09/06/13 0503  NA 132* 135 140 139  K 3.6 3.6 3.3* 3.4*  CL 99 100 99 100  CO2 21 23 23 26   GLUCOSE 204* 127* 124* 126*  BUN 18 19 18 19   CREATININE 1.38* 1.52* 1.66* 1.63*  CALCIUM 9.8 9.8 10.1 9.9    Liver Function Tests:  Recent Labs Lab 09/03/13 2005  AST 38*  ALT 24  ALKPHOS 75  BILITOT 0.8  PROT 7.5  ALBUMIN 3.4*   No results found for this basename: LIPASE, AMYLASE,  in the last 168 hours No results found for this basename: AMMONIA,  in the last 168 hours CBC:  Recent Labs Lab 09/03/13 2005 09/05/13 0530 09/06/13 0503  WBC 16.8* 10.3 10.4  NEUTROABS 14.4*  --   --   HGB 12.8 13.7 12.9  HCT 37.6 41.4 39.0  MCV 92.4 93.2 93.1  PLT 251 175 279   Cardiac Enzymes:  Recent Labs Lab 09/03/13 2005 09/04/13 0235 09/04/13 0815  TROPONINI <0.30 <0.30 <0.30   BNP (last 3 results)  Recent Labs  09/03/13 2005  PROBNP 501.7*   CBG: No results found for this basename: GLUCAP,  in the last 168 hours  Recent Results (from the past 240 hour(s))  URINE CULTURE     Status: None   Collection Time    09/03/13 10:08 PM      Result Value Range Status   Specimen Description URINE, CATHETERIZED   Final   Special Requests NONE   Final   Culture  Setup Time     Final   Value: 09/04/2013 06:05     Performed at [facility] Count     Final   Value: NO GROWTH  Performed at [facility]     Final   Value: NO GROWTH     Performed at [facility]   Report Status 09/05/2013 FINAL   Final  CULTURE, BLOOD (ROUTINE X 2)     Status: None   Collection Time    09/03/13 10:53 PM      Result Value Range Status   Specimen Description BLOOD LEFT HAND   Final   Special Requests BOTTLES DRAWN AEROBIC AND ANAEROBIC 5CC   Final   Culture  Setup Time     Final   Value: 09/04/2013 05:53     Performed at [facility]   Culture     Final   Value:        BLOOD CULTURE RECEIVED NO GROWTH TO DATE CULTURE WILL BE HELD FOR 5 DAYS BEFORE ISSUING A FINAL NEGATIVE REPORT     Performed at [facility]   Report Status PENDING   Incomplete  CULTURE, BLOOD (ROUTINE X 2)     Status: None   Collection Time    09/03/13 11:01 PM       Result Value Range Status   Specimen Description BLOOD RIGHT HAND   Final   Special Requests BOTTLES DRAWN AEROBIC AND ANAEROBIC 5CC   Final   Culture  Setup Time     Final   Value: 09/04/2013 05:54     Performed at [facility]   Culture     Final   Value:        BLOOD CULTURE RECEIVED NO GROWTH TO DATE CULTURE WILL BE HELD FOR 5 DAYS BEFORE ISSUING A FINAL NEGATIVE REPORT     Performed at [facility]   Report Status PENDING   Incomplete     Studies: No results found.  Scheduled Meds: . atorvastatin  40 mg Oral QHS  . dipyridamole-aspirin  1 capsule Oral BID AC  . enoxaparin (LOVENOX) injection  30 mg Subcutaneous Q24H  . fenofibrate  160 mg Oral Daily  . fluticasone  2 spray Each Nare Daily  . folic acid-pyridoxine-cyancobalamin  1 tablet Oral Daily  . furosemide  20 mg Oral Daily  . levofloxacin  750 mg Oral Q48H  . levothyroxine  50 mcg Oral Q0600  . magnesium oxide  400 mg Oral Daily  . metoprolol tartrate  50 mg Oral BID  . pantoprazole  40 mg Oral Daily  . sodium chloride  3 mL Intravenous Q12H   Continuous Infusions:   Active Problems:   Leukocytosis   Fever of unknown origin   Hypoxia   SOB (shortness of breath)   SIRS (systemic inflammatory response syndrome)   Acute CHF    Time spent:    [facility]  [facility] Pager [phone]. If 7PM-7AM, please contact night-coverage at [email or web address], password [facility] 09/06/2013, 2:59 PM  LOS: 3 days

## 2013-09-06 NOTE — Progress Notes (Signed)
PT and husband at bedside state that their granddaughter in [place] has setup pt up with Home Health and Home O2 in [place]. Pt and Husband will give name of company to RN.

## 2013-09-06 NOTE — Evaluation (Signed)
Physical Therapy Evaluation Patient Details Name: April Le MRN: [id number] DOB: 04/05/1941 Today's Date: 09/06/2013 Time: 3244-0102 PT Time Calculation (min): 14 min  PT Assessment / Plan / Recommendation History of Present Illness  72 year old female with history of hypertension, stroke, hyperlipidemia, and CKD stage III presents with 3 weeks of progressive shortness of breath. The patient states that she has had decreasing endurance and increasing dyspnea on exertion when walking short distances. In addition, she also complains of paroxysmal nocturnal dyspnea which has been more frequent in the past 3 weeks. There has been peripheral lower extremity edema, but she states that this has not significantly worsened in the past 3 weeks.  Clinical Impression  On eval, pt required Min guard assist for mobility-able to ambulate ~300 feet with RW on 2L O2. O2 sats dropped to 84% on 2L. Plan is for home with family assisting as needed. Recommend HHPT.     PT Assessment  Patient needs continued PT services    Follow Up Recommendations  Home health PT    Does the patient have the potential to tolerate intense rehabilitation      Barriers to Discharge        Equipment Recommendations  None recommended by PT    Recommendations for Other Services     Frequency Min 3X/week    Precautions / Restrictions Precautions Precautions: Fall Restrictions Weight Bearing Restrictions: No   Pertinent Vitals/Pain Upper L side back pain-5/10. Warm packs placed on area O2 sats 84% on 2L O2      Mobility  Bed Mobility Bed Mobility: Supine to Sit Supine to Sit: 6: Modified independent (Device/Increase time);HOB elevated Transfers Transfers: Sit to Stand;Stand to Sit Sit to Stand: 4: Min guard;From bed Stand to Sit: 4: Min guard;To chair/3-in-1 Details for Transfer Assistance: VCs safety, hand placement Ambulation/Gait Ambulation/Gait Assistance: 4: Min guard Ambulation Distance (Feet): 300  Feet Assistive device: Rolling walker Ambulation/Gait Assistance Details: close guard for safety. dyspnea 2/4. ambulated on 2L O2-84%. VCs pursed lip breathing.  Gait Pattern: Step-through pattern;Decreased stride length;Decreased dorsiflexion - left    Exercises     PT Diagnosis: Difficulty walking  PT Problem List: Decreased strength;Decreased mobility;Decreased activity tolerance PT Treatment Interventions: DME instruction;Gait training;Functional mobility training;Therapeutic activities;Therapeutic exercise;Patient/family education     PT Goals(Current goals can be found in the care plan section) Acute Rehab PT Goals Patient Stated Goal: home tomorrow PT Goal Formulation: With patient/family Time For Goal Achievement: 09/13/13 Potential to Achieve Goals: Good  Visit Information  Last PT Received On: 09/06/13 Assistance Needed: +1 History of Present Illness: 72 year old female with history of hypertension, stroke, hyperlipidemia, and CKD stage III presents with 3 weeks of progressive shortness of breath. The patient states that she has had decreasing endurance and increasing dyspnea on exertion when walking short distances. In addition, she also complains of paroxysmal nocturnal dyspnea which has been more frequent in the past 3 weeks. There has been peripheral lower extremity edema, but she states that this has not significantly worsened in the past 3 weeks.       Prior Functioning  Home Living Family/patient expects to be discharged to:: Private residence Living Arrangements: Spouse/significant other Available Help at Discharge: Family Type of Home: Other(Comment) (townhome) Home Access: Stairs to enter Entrance Stairs-Number of Steps: 1 Home Layout: Able to live on main level with bedroom/bathroom;Two level Home Equipment: Walker - 2 wheels;Cane - single point Prior Function Level of Independence: Independent with assistive device(s);Needs assistance ADL's / Homemaking  Assistance Needed: husband  helps with household tasks.  Comments: used both cane and walker    Cognition  Cognition Arousal/Alertness: Awake/alert Behavior During Therapy: WFL for tasks assessed/performed Overall Cognitive Status: Within Functional Limits for tasks assessed    Extremity/Trunk Assessment Upper Extremity Assessment Upper Extremity Assessment: Overall WFL for tasks assessed Lower Extremity Assessment Lower Extremity Assessment: LLE deficits/detail;Generalized weakness LLE Deficits / Details: residual weakness from old CVA   Balance    End of Session PT - End of Session Equipment Utilized During Treatment: Oxygen Activity Tolerance: Patient tolerated treatment well Patient left: in chair;with call bell/phone within reach;with family/visitor present  GP     [name], MPT Pager: [phone]

## 2013-09-06 NOTE — Progress Notes (Signed)
Spoke with pt and husband concerning home health. Pt's husband stated they did not understand and wanted a home health agency. Pt and husband now want HH agency in network with pt's insurance. Referral given to  [facility].

## 2013-09-07 DIAGNOSIS — J189 Pneumonia, unspecified organism: Secondary | ICD-10-CM | POA: Diagnosis present

## 2013-09-07 HISTORY — DX: Pneumonia, unspecified organism: J18.9

## 2013-09-07 LAB — BASIC METABOLIC PANEL WITH GFR
BUN: 16 mg/dL (ref 6–23)
CO2: 27 meq/L (ref 19–32)
Calcium: 10 mg/dL (ref 8.4–10.5)
Chloride: 100 meq/L (ref 96–112)
Creatinine, Ser: 1.36 mg/dL — ABNORMAL HIGH (ref 0.50–1.10)
GFR calc Af Amer: 44 mL/min — ABNORMAL LOW
GFR calc non Af Amer: 38 mL/min — ABNORMAL LOW
Glucose, Bld: 121 mg/dL — ABNORMAL HIGH (ref 70–99)
Potassium: 3.4 meq/L — ABNORMAL LOW (ref 3.5–5.1)
Sodium: 139 meq/L (ref 135–145)

## 2013-09-07 LAB — CBC
HCT: 36 % (ref 36.0–46.0)
Hemoglobin: 12 g/dL (ref 12.0–15.0)
MCH: 30.7 pg (ref 26.0–34.0)
MCHC: 33.3 g/dL (ref 30.0–36.0)
MCV: 92.1 fL (ref 78.0–100.0)
Platelets: 262 K/uL (ref 150–400)
RBC: 3.91 MIL/uL (ref 3.87–5.11)
RDW: 12.7 % (ref 11.5–15.5)
WBC: 9.8 K/uL (ref 4.0–10.5)

## 2013-09-07 LAB — CLOSTRIDIUM DIFFICILE BY PCR: Toxigenic C. Difficile by PCR: NEGATIVE

## 2013-09-07 MED ORDER — ENOXAPARIN SODIUM 40 MG/0.4ML ~~LOC~~ SOLN: 40.0000 mg | SUBCUTANEOUS | Status: DC

## 2013-09-07 MED ORDER — FUROSEMIDE 20 MG PO TABS: 20.0000 mg | ORAL_TABLET | Freq: Every day | ORAL | Status: DC

## 2013-09-07 MED ORDER — LEVOFLOXACIN 500 MG PO TABS: 750.0000 mg | ORAL_TABLET | Freq: Every day | ORAL | Status: DC

## 2013-09-07 NOTE — Progress Notes (Signed)
   CARE MANAGEMENT NOTE 09/07/2013  Patient:  April Le, April Le   Account Number:  [id number]  Date Initiated:  09/04/2013  Documentation initiated by:  Ezekiel Ina  Subjective/Objective Assessment:   Pt admitted with cco SOB, Sats 70's,     Action/Plan:   from home   Anticipated DC Date:  09/06/2013   Anticipated DC Plan:  HOME/SELF CARE      DC Planning Services  CM consult      PAC Choice  DURABLE MEDICAL EQUIPMENT  HOME HEALTH   Choice offered to / List presented to:  C-3 Spouse   DME arranged  OX[facility]GEN      DME agency  [facility]     HH arranged  HH-1 RN  HH-2 PT  HH-4 NURSE'S AIDE      HH agency  [facility]   Status of service:  Completed, signed off Medicare Important Message given?  NA - LOS <3 / Initial given by admissions (If response is "NO", the following Medicare IM given date fields will be blank) Date Medicare IM given:   Date Additional Medicare IM given:    Discharge Disposition:  HOME/SELF CARE  Per UR Regulation:  Reviewed for med. necessity/level of care/duration of stay  If discussed at Long Length of Stay Meetings, dates discussed:    Comments:  09/07/2013 1530 NCM notified [facility] of scheduled dc home today. Contacted [facility] for oxygen. Isidoro Donning RN CCM Case Mgmt phone [phone]  09/06/13 MMcGibboney, RN, BSN Spoke with pt and husband concerning home health. Pt's husband stated they did not understand and wanted a home health agency. Pt and husband now want HH agency in network with pt's insurance. Referral given to  [facility].  09/06/13 MMcGibboney, RN, BSN PT and husband at bedside state that their granddaughter in [place] has setup pt up with Home Health and Home O2 in [place]. Pt and Husband will give name of company to RN.   09/04/13 Geni Bers, RN, BSN Chart reviewed.

## 2013-09-07 NOTE — Progress Notes (Signed)
Per Unit RN, pt is 84% on RA. Isidoro Donning RN CCM Case Mgmt

## 2013-09-10 LAB — CULTURE, BLOOD (ROUTINE X 2): Culture: NO GROWTH

## 2013-09-17 NOTE — Discharge Summary (Signed)
Physician Discharge Summary  April Le VHQ:[id number] DOB: [birth year] DOA: 09/03/2013  PCP: Marylen Ponto, MD  Admit date: 09/03/2013 Discharge date: 09/07/2013  Time spent:  Recommendations for Outpatient Follow-up:  1. PCP in 1 week 2. Home health RN/PT 3.  Sleep study as outpatient  Discharge Diagnoses:  Community acquired pneumonia/Bronchitis Acute Diastolic CHF Hypoxia Suspected OSA Hypertension SIRS (systemic inflammatory response syndrome) Obesity H/o CVA Dysarthria CKD 3  Discharge Condition: stable  Diet recommendation: low sodium  Filed Weights   09/05/13 0500 09/06/13 0500 09/07/13 0500  Weight: 79 kg (174 lb 2.6 oz) 79.379 kg (175 lb) 80.287 kg (177 lb)    History of present illness:  HPI:  72 year old female with history of hypertension, stroke, hyperlipidemia, and CKD stage III presents with 3 weeks of progressive shortness of breath. The patient states that she has had decreasing endurance and increasing dyspnea on exertion when walking short distances. In addition, she also complains of paroxysmal nocturnal dyspnea which has been more frequent in the past 3 weeks. There has been peripheral lower extremity edema, but she states that this has not significantly worsened in the past 3 weeks. In addition, the patient experienced a temperature of 100.41F on the day prior to admission. She went to see her primary care physician yesterday during which time the patient had a fever of 101.77F in the office. The patient was started on moxifloxacin. She has only taken one dose. The patient's shortness breath did not improve. The patient's daughter thought that she was more labored. As a result, the patient was brought to the emergency department for further evaluation. The patient has had increasing cough with yellow and white sputum. There is no hemoptysis, nausea, vomiting, diarrhea, abdominal pain, dysuria, hematuria, rashes. She denies any chest discomfort,  dizziness, syncope headache.  In the emergency department, the patient was noted to have oxygen saturation in the 70s at the time of arrival. The patient was placed on 4 L nasal cannula with oxygen saturations improving up to 95-96%. The patient has not had any respiratory distress. CT angiogram of the chest was performed and was negative for pulmonary embolus but showed patchy subpleural lung densities concerning for scarring versus interstitial edema. There is also shotty mediastinal and precarinal lymphadenopathy. Chest x-ray showed mild central pulmonary vascular congestion with interstitial markings. ProBNP was 501. WBC was 16.8. BMP showed sodium 132. Serum creatinine was 1.22. Urinalysis was negative for pyuria. EKG showed sinus rhythm without ST to T wave changes. In ED, the patient was given 500 cc of IV fluid.   Hospital Course:  Early pneumonia/Bronchitis with SIRs  -improving, initially was on IV Rocephin and Zithromax -Then transitioned to Po levaquin -Blood cultures x2 negative so far  -weaned O2 down but desaturating with activity and hence discharged home on O2  Acute Diastolic CHF  -May have been precipitated by the patient's infectious process  -improved with IV lasix, I/Os inaccurate  -changed to Po lasix 10/23  -Echocardiogram noted, normal EF/Grade 1 diastolic Dysfunction  - TSH ok  -cardiac enzymes normal  -hold ACE since, creatinine bumped   Suspected OSA -needs sleep study as outpatient  Hypertension  -stable, on metoprolol, amlodipine -clonidine held since BP soft initially and stable on above meds, re-assess need to resume this upon FU - ramipril on hold since creatinine bumped slightly   CKD stage III  -Baseline creatinine 1.3  -slightly worse with diuresis but then stabilized and improved back to baseline -could resume ACE if creatinine and  BP tolerates   History of stroke  -Patient has residual dysarthria  -Continue Aggrenox      Discharge  Exam: Filed Vitals:   09/07/13 1430  BP: 151/63  Pulse: 74  Temp: 98.3 F (36.8 C)  Resp: 18    General: AAOx3 Cardiovascular: S1S2/RRR Respiratory: scattered ronchi at bases  Discharge Instructions  Discharge Orders   Future Orders Complete By Expires   Diet - low sodium heart healthy  As directed    Increase activity slowly  As directed        Medication List    STOP taking these medications       cloNIDine 0.1 MG tablet  Commonly known as:  CATAPRES     moxifloxacin 400 MG tablet  Commonly known as:  AVELOX     ramipril 10 MG capsule  Commonly known as:  ALTACE      TAKE these medications       amLODipine 10 MG tablet  Commonly known as:  NORVASC  Take 10 mg by mouth daily with breakfast.     aspirin 81 MG tablet  Take 81 mg by mouth daily with breakfast.     atorvastatin 40 MG tablet  Commonly known as:  LIPITOR  Take 40 mg by mouth at bedtime.     dipyridamole-aspirin 200-25 MG per 12 hr capsule  Commonly known as:  AGGRENOX  Take 1 capsule by mouth 2 (two) times daily.     fenofibrate 145 MG tablet  Commonly known as:  TRICOR  Take 145 mg by mouth daily with breakfast.     FOLTX PO  Take 1 tablet by mouth daily.     furosemide 20 MG tablet  Commonly known as:  LASIX  Take 1 tablet (20 mg total) by mouth daily.     levofloxacin 500 MG tablet  Commonly known as:  LEVAQUIN  Take 1.5 tablets (750 mg total) by mouth daily. For 3 days     levothyroxine 50 MCG tablet  Commonly known as:  SYNTHROID, LEVOTHROID  Take 50 mcg by mouth daily at 6 (six) AM.     magnesium oxide 400 MG tablet  Commonly known as:  MAG-OX  Take 400 mg by mouth daily.     metoprolol 50 MG tablet  Commonly known as:  LOPRESSOR  Take 50 mg by mouth 2 (two) times daily. One at 9 am and at 8pm     mometasone 50 MCG/ACT nasal spray  Commonly known as:  NASONEX  - Place 2 sprays into the nose daily as needed. ALLERGIES  -   -      pantoprazole 40 MG tablet   Commonly known as:  PROTONIX  Take 40 mg by mouth daily.       Allergies  Allergen Reactions  . Amoxicillin-Pot Clavulanate     GI INTOLERANCE ONLY WITH DIARRHEA       Follow-up Information   Follow up with HOLT,LYNLEY S, MD In 1 week.   Specialty:  Family Medicine   Contact information:   [address] [place] [address] [phone]       Follow up with [facility]. [facility] RN, aide, and Physical Therapy. please contact [facility] as soon as you arrive home to deliver oxygen. )    Contact information:   [address] [place] [address] [phone]        The results of significant diagnostics from this hospitalization (including imaging, microbiology, ancillary and laboratory) are  listed below for reference.    Significant Diagnostic Studies: Dg Chest 2 View  09/03/2013   CLINICAL DATA:  Cough, shortness of breath.  EXAM: CHEST  2 VIEW  COMPARISON:  September 10, 2008.  FINDINGS: The heart size and mediastinal contours are within normal limits. Mild central pulmonary vascular congestion is noted. No pleural effusion or pneumothorax is noted. Both lungs are clear. The visualized skeletal structures are unremarkable.  IMPRESSION: Central pulmonary vascular congestion is noted. No other abnormality is noted.   Electronically Signed   By: Roque Lias M.D.   On: 09/03/2013 21:09   Ct Angio Chest Pe W/cm &/or Wo Cm  09/03/2013   CLINICAL DATA:  Short of breath  EXAM: CT ANGIOGRAPHY CHEST WITH CONTRAST  TECHNIQUE: Multidetector CT imaging of the chest was performed using the standard protocol during bolus administration of intravenous contrast. Multiplanar CT image reconstructions including MIPs were obtained to evaluate the vascular anatomy.  CONTRAST:  OMNIPAQUE IOHEXOL 350 MG/ML SOLN  COMPARISON:  Chest 09/03/2013  FINDINGS: Negative for pulmonary embolism. Negative for aortic aneurysm or dissection. Mild corner artery calcification.  Negative for pericardial effusion.  Patchy subpleural lung densities bilaterally, consistent with pulmonary scarring and possibly interstitial edema. No significant pleural effusion. Mild dependent atelectasis in the lung bases.  Mild adenopathy. 15 mm anterior mediastinal node. 1 cm precarinal nodes. Mild hilar adenopathy bilaterally.  Review of the MIP images confirms the above findings.  IMPRESSION: Negative for pulmonary embolism.  Probable subpleural scarring versus edema.  Mild mediastinal and hilar adenopathy. These are nonspecific but may be due to reactive lymph nodes.   Electronically Signed   By: Marlan Palau M.D.   On: 09/03/2013 23:35    Microbiology: No results found for this or any previous visit (from the past 240 hour(s)).   Labs: Basic Metabolic Panel: No results found for this basename: NA, K, CL, CO2, GLUCOSE, BUN, CREATININE, CALCIUM, MG, PHOS,  in the last 168 hours Liver Function Tests: No results found for this basename: AST, ALT, ALKPHOS, BILITOT, PROT, ALBUMIN,  in the last 168 hours No results found for this basename: LIPASE, AMYLASE,  in the last 168 hours No results found for this basename: AMMONIA,  in the last 168 hours CBC: No results found for this basename: WBC, NEUTROABS, HGB, HCT, MCV, PLT,  in the last 168 hours Cardiac Enzymes: No results found for this basename: CKTOTAL, CKMB, CKMBINDEX, TROPONINI,  in the last 168 hours BNP: BNP (last 3 results)  Recent Labs  09/03/13 2005  PROBNP 501.7*   CBG: No results found for this basename: GLUCAP,  in the last 168 hours     Signed:  Addam Goeller  [facility] 09/17/2013, 9:34 PM

## 2013-09-19 ENCOUNTER — Ambulatory Visit: Payer: Medicare Other | Admitting: Cardiovascular Disease

## 2013-09-19 ENCOUNTER — Institutional Professional Consult (permissible substitution): Payer: Medicare Other | Admitting: Cardiology

## 2013-09-27 ENCOUNTER — Institutional Professional Consult (permissible substitution): Payer: Medicare Other | Admitting: Cardiology

## 2013-11-19 ENCOUNTER — Encounter: Payer: Self-pay | Admitting: Cardiology

## 2013-11-19 ENCOUNTER — Encounter (INDEPENDENT_AMBULATORY_CARE_PROVIDER_SITE_OTHER): Payer: Self-pay

## 2013-11-19 ENCOUNTER — Ambulatory Visit (INDEPENDENT_AMBULATORY_CARE_PROVIDER_SITE_OTHER): Payer: Medicare Other | Admitting: Cardiology

## 2013-11-19 VITALS — BP 122/68 | HR 70 | Ht 60.0 in | Wt 167.0 lb

## 2013-11-19 DIAGNOSIS — I10 Essential (primary) hypertension: Secondary | ICD-10-CM

## 2013-11-19 DIAGNOSIS — G4733 Obstructive sleep apnea (adult) (pediatric): Secondary | ICD-10-CM

## 2013-11-19 DIAGNOSIS — I5032 Chronic diastolic (congestive) heart failure: Secondary | ICD-10-CM

## 2013-11-19 HISTORY — DX: Essential (primary) hypertension: I10

## 2013-11-19 HISTORY — DX: Obstructive sleep apnea (adult) (pediatric): G47.33

## 2013-11-19 HISTORY — DX: Chronic diastolic (congestive) heart failure: I50.32

## 2013-11-19 NOTE — Patient Instructions (Signed)
Your physician recommends that you continue on your current medications as directed. Please refer to the Current Medication list given to you today.  Your physician has recommended that you have a sleep study. This test records several body functions during sleep, including: brain activity, eye movement, oxygen and carbon dioxide blood levels, heart rate and rhythm, breathing rate and rhythm, the flow of air through your mouth and nose, snoring, body muscle movements, and chest and belly movement.  Your physician recommends that you schedule a follow-up appointment in: 3 months

## 2013-11-19 NOTE — Progress Notes (Signed)
[place], April Le, [place]  [address] Phone: [phone] Fax:  [phone]  Date:  11/19/2013   ID:  April Le, DOB 12-16-1940, MRN [id number]  PCP:  Ronita Hipps, MD  Cardiologist:  Fransico Him ,MD     History of Present Illness: April Le is a 73 y.o. female with a history of HTN, CKD stage 3 who was recently discharge form [facility] after admission for PNA and acute diastolic CHF.  She also was suspected of having OSA.  She presented with a 3 week history of progressive SOB, decreased endurance and increased DOE with walking short distances.  She was having PND and has chronic LE edema which has been stable.  She was having a cough productive of yellow mucous and was hypoxic in the 70's on arrival in the ER.  CT angio of the chest did not show PE but did show patchy subpleural lung densities c/w interstitial edema and proBNP was 501 with elevated WBC at 16K.  SHe was treated with IV antibiotics and diuresed with IV Lasix.  2D echo showed normal LVF with grade I diastolic dysfunction.  TSH and cardiac enzymes were normal.  SHe was also noted to have apnea during sleep and outpatient sleep study was recommended.  She now presents today for evaluation of OSA and diastolic CHF.  She had a sleep study in 2009 that did not show any sleep apnea.  Her husband says that she does snore and occasionally he will notice her stop breathing in her sleep.  She says that sometimes she will feel refreshed in the am but sometimes she is tired.  She usually does not nap during the day.  Since discharge from the hospital her SOB has improved significantly but still gets some DOE when up moving around in the house.  She denies any chest pain or LE edema.   Wt Readings from Last 3 Encounters:  09/07/13 177 lb (80.287 kg)  12/15/11 167 lb 14.4 oz (76.159 kg)  11/22/11 188 lb 0.8 oz (85.3 kg)     Past Medical History  Diagnosis Date  . Hypothyroidism   . GERD (gastroesophageal reflux disease)     . Hypercholesteremia   . Headache(784.0)   . Arthritis     right knee  . 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 Urinary incontinence   . Abdominal distention     past two months  . Weakness of left leg     Mildly, post stroke  . Hypertension   . Stroke 02/2007, 08/2009    2 total -RESIDUAL WEAKNESS LEFT LEG--AND APHASIA  . Shortness of breath     WITH EXERTION  . Sinus drainage     PT STARTED ANTIBIOTIC 11/17/11 --IS HOARSE TODAY, SOME WHEEZING AND COUGH WITH YELLOW DRAINAGE  . Chronic kidney disease     STAGE III KIDNEY DISEASE-PER PT--PT SEES DR. Risa Grill UROLOGIST  . Complication of anesthesia     PT REMEMBERS BREATHING PROBLEMS WAKING UP FROM KNEE SURGERY AT [place] 2011 OR 2012    Current Outpatient Prescriptions  Medication Sig Dispense Refill  . amLODipine (NORVASC) 10 MG tablet Take 10 mg by mouth daily with breakfast.       . aspirin 81 MG tablet Take 81 mg by mouth daily with breakfast.       . atorvastatin (LIPITOR) 40 MG tablet Take 40 mg by mouth at bedtime.       . dipyridamole-aspirin (AGGRENOX) 200-25 MG per 12  hr capsule Take 1 capsule by mouth 2 (two) times daily.      Marland Kitchen FA-Pyridoxine-Cyancobalamin (FOLTX PO) Take 1 tablet by mouth daily.       . fenofibrate (TRICOR) 145 MG tablet Take 145 mg by mouth daily with breakfast.       . furosemide (LASIX) 20 MG tablet Take 1 tablet (20 mg total) by mouth daily.  30 tablet  0  . levofloxacin (LEVAQUIN) 500 MG tablet Take 1.5 tablets (750 mg total) by mouth daily. For 3 days  3 tablet  0  . levothyroxine (SYNTHROID, LEVOTHROID) 50 MCG tablet Take 50 mcg by mouth daily at 6 (six) AM.       . magnesium oxide (MAG-OX) 400 MG tablet Take 400 mg by mouth daily.       . metoprolol (LOPRESSOR) 50 MG tablet Take 50 mg by mouth 2 (two) times daily. One at 9 am and at 8pm      . mometasone (NASONEX) 50 MCG/ACT nasal spray Place 2 sprays into the nose daily as needed. ALLERGIES        . pantoprazole (PROTONIX) 40 MG tablet  Take 40 mg by mouth daily.        No current facility-administered medications for this visit.    Allergies:    Allergies  Allergen Reactions  . Amoxicillin-Pot Clavulanate     GI INTOLERANCE ONLY WITH DIARRHEA    Social History:  The patient  reports that she quit smoking about 54 years ago. She does not have any smokeless tobacco history on file. She reports that she does not drink alcohol or use illicit drugs.   Family History:  The patient's family history includes Heart attack in her father; Heart failure in her father; Kidney failure in her mother; Stomach cancer in her other.   ROS:  Please see the history of present illness.      All other systems reviewed and negative.   PHYSICAL EXAM: VS:  There were no vitals taken for this visit. Well nourished, well developed, in no acute distress HEENT: normal Neck: no JVD Cardiac:  normal S1, S2; RRR; no murmur Lungs:  clear to auscultation bilaterally, no wheezing, rhonchi or rales Abd: soft, nontender, no hepatomegaly Ext: no edema Skin: warm and dry Neuro:  CNs 2-12 intact, no focal abnormalities noted       ASSESSMENT AND PLAN:  1. Chronic diastolic CHF - well compensated  - continue Lasix/metoprolol  - I encouraged her to weigh herself daily and call if her weight increases more than 3 pounds in 1 day or 5 pounds in a week to call 2. Probable OSA  - will set up sleep study 3. HTN - well controlled  - continue metoprolol/amlodipine 4.   CKD stage 3 - followed by Dr. Lorrene Reid  Followup with me in 3 months    Signed, Fransico Him, MD 11/19/2013 2:41 PM

## 2013-12-10 ENCOUNTER — Telehealth: Payer: Self-pay | Admitting: Cardiology

## 2013-12-10 NOTE — Telephone Encounter (Signed)
Please let patient know that she has mild OSA and set up for CPAP titration 

## 2013-12-11 ENCOUNTER — Telehealth: Payer: Self-pay | Admitting: Cardiology

## 2013-12-11 NOTE — Telephone Encounter (Signed)
Form filled out for titration

## 2013-12-11 NOTE — Telephone Encounter (Signed)
LVM for pt to call back.

## 2013-12-11 NOTE — Telephone Encounter (Signed)
°  Patient is returning your call, please call back.  °

## 2013-12-11 NOTE — Telephone Encounter (Signed)
Pt is aware Form put up front with Vaughan Basta to be faxed to [facility] and [facility]

## 2013-12-30 ENCOUNTER — Telehealth: Payer: Self-pay | Admitting: Cardiology

## 2013-12-30 NOTE — Telephone Encounter (Signed)
Please let patient know that she could not be adequately titrated with CPAP and please set up repeat study with BiPAP in the lab

## 2014-01-01 NOTE — Telephone Encounter (Signed)
LVM for pt to return call

## 2014-01-01 NOTE — Telephone Encounter (Signed)
Follow up     Patient husband calling returning call back to nurse.

## 2014-01-01 NOTE — Telephone Encounter (Signed)
Form filled out and given to [facility] to fax to [place] center to schedule.

## 2014-01-01 NOTE — Telephone Encounter (Signed)
Pt is aware.  

## 2014-01-02 ENCOUNTER — Encounter: Payer: Self-pay | Admitting: Cardiology

## 2014-01-13 ENCOUNTER — Telehealth: Payer: Self-pay | Admitting: Cardiology

## 2014-01-13 NOTE — Telephone Encounter (Signed)
Husband called stating someone called them last week and was returning call re: sleep study.  Advised would have to call the [place] sleep center at [phone].

## 2014-01-13 NOTE — Telephone Encounter (Signed)
New message ° ° ° ° ° °Returning a nurses call from last week °

## 2014-01-17 ENCOUNTER — Telehealth: Payer: Self-pay | Admitting: Cardiology

## 2014-01-20 ENCOUNTER — Encounter: Payer: Self-pay | Admitting: Cardiology

## 2014-02-07 ENCOUNTER — Telehealth: Payer: Self-pay | Admitting: Critical Care Medicine

## 2014-02-07 NOTE — Telephone Encounter (Signed)
Set an appt w/ PW in [place] for 02/18/14 @ 11:30 AM for persistent cough.  Advised to have pt arrive 15 mins early & to bring current meds to appt.  Verbalized understanding & nothing further needed at this time.  April Le

## 2014-02-20 ENCOUNTER — Ambulatory Visit: Payer: Medicare Other | Admitting: Cardiology

## 2014-03-18 ENCOUNTER — Telehealth: Payer: Self-pay | Admitting: Cardiology

## 2014-03-18 NOTE — Telephone Encounter (Signed)
New problem   Need an order for the sleep study Dr Radford Pax ordered for patient to have in february and also any notes. Please call.

## 2014-03-18 NOTE — Telephone Encounter (Signed)
LVM for pt to return call

## 2014-03-20 NOTE — Telephone Encounter (Signed)
Follow Up ° °Pt returning call from earlier. Please call. °

## 2014-03-20 NOTE — Telephone Encounter (Signed)
Printed OV stating pt needed sleep study and sent to [facility] for pts insurance.

## 2014-06-27 NOTE — Telephone Encounter (Signed)
error 

## 2015-10-06 ENCOUNTER — Telehealth: Payer: Self-pay | Admitting: Cardiology

## 2015-10-06 NOTE — Telephone Encounter (Deleted)
Error

## 2015-10-28 ENCOUNTER — Ambulatory Visit (INDEPENDENT_AMBULATORY_CARE_PROVIDER_SITE_OTHER): Payer: Medicare Other | Admitting: Neurology

## 2015-10-28 ENCOUNTER — Encounter: Payer: Self-pay | Admitting: Neurology

## 2015-10-28 VITALS — BP 124/60 | HR 66 | Ht 59.0 in | Wt 185.0 lb

## 2015-10-28 DIAGNOSIS — I69328 Other speech and language deficits following cerebral infarction: Secondary | ICD-10-CM

## 2015-10-28 DIAGNOSIS — I69359 Hemiplegia and hemiparesis following cerebral infarction affecting unspecified side: Secondary | ICD-10-CM

## 2015-10-28 DIAGNOSIS — R269 Unspecified abnormalities of gait and mobility: Secondary | ICD-10-CM | POA: Diagnosis not present

## 2015-10-28 HISTORY — DX: Unspecified abnormalities of gait and mobility: R26.9

## 2015-10-28 HISTORY — DX: Other speech and language deficits following cerebral infarction: I69.359

## 2015-10-28 HISTORY — DX: Other speech and language deficits following cerebral infarction: I69.328

## 2015-10-28 NOTE — Patient Instructions (Signed)
Fall Prevention in the Home  Falls can cause injuries and can affect people from all age groups. There are many simple things that you can do to make your home safe and to help prevent falls. WHAT CAN I DO ON THE OUTSIDE OF MY HOME?  Regularly repair the edges of walkways and driveways and fix any cracks.  Remove high doorway thresholds.  Trim any shrubbery on the main path into your home.  Use bright outdoor lighting.  Clear walkways of debris and clutter, including tools and rocks.  Regularly check that handrails are securely fastened and in good repair. Both sides of any steps should have handrails.  Install guardrails along the edges of any raised decks or porches.  Have leaves, snow, and ice cleared regularly.  Use sand or salt on walkways during winter months.  In the garage, clean up any spills right away, including grease or oil spills. WHAT CAN I DO IN THE BATHROOM?  Use night lights.  Install grab bars by the toilet and in the tub and shower. Do not use towel bars as grab bars.  Use non-skid mats or decals on the floor of the tub or shower.  If you need to sit down while you are in the shower, use a plastic, non-slip stool..  Keep the floor dry. Immediately clean up any water that spills on the floor.  Remove soap buildup in the tub or shower on a regular basis.  Attach bath mats securely with double-sided non-slip rug tape.  Remove throw rugs and other tripping hazards from the floor. WHAT CAN I DO IN THE BEDROOM?  Use night lights.  Make sure that a bedside light is easy to reach.  Do not use oversized bedding that drapes onto the floor.  Have a firm chair that has side arms to use for getting dressed.  Remove throw rugs and other tripping hazards from the floor. WHAT CAN I DO IN THE KITCHEN?   Clean up any spills right away.  Avoid walking on wet floors.  Place frequently used items in easy-to-reach places.  If you need to reach for something  above you, use a sturdy step stool that has a grab bar.  Keep electrical cables out of the way.  Do not use floor polish or wax that makes floors slippery. If you have to use wax, make sure that it is non-skid floor wax.  Remove throw rugs and other tripping hazards from the floor. WHAT CAN I DO IN THE STAIRWAYS?  Do not leave any items on the stairs.  Make sure that there are handrails on both sides of the stairs. Fix handrails that are broken or loose. Make sure that handrails are as long as the stairways.  Check any carpeting to make sure that it is firmly attached to the stairs. Fix any carpet that is loose or worn.  Avoid having throw rugs at the top or bottom of stairways, or secure the rugs with carpet tape to prevent them from moving.  Make sure that you have a light switch at the top of the stairs and the bottom of the stairs. If you do not have them, have them installed. WHAT ARE SOME OTHER FALL PREVENTION TIPS?  Wear closed-toe shoes that fit well and support your feet. Wear shoes that have rubber soles or low heels.  When you use a stepladder, make sure that it is completely opened and that the sides are firmly locked. Have someone hold the ladder while you   are using it. Do not climb a closed stepladder.  Add color or contrast paint or tape to grab bars and handrails in your home. Place contrasting color strips on the first and last steps.  Use mobility aids as needed, such as canes, walkers, scooters, and crutches.  Turn on lights if it is dark. Replace any light bulbs that burn out.  Set up furniture so that there are clear paths. Keep the furniture in the same spot.  Fix any uneven floor surfaces.  Choose a carpet design that does not hide the edge of steps of a stairway.  Be aware of any and all pets.  Review your medicines with your healthcare provider. Some medicines can cause dizziness or changes in blood pressure, which increase your risk of falling. Talk  with your health care provider about other ways that you can decrease your risk of falls. This may include working with a physical therapist or trainer to improve your strength, balance, and endurance.   This information is not intended to replace advice given to you by your health care provider. Make sure you discuss any questions you have with your health care provider.   Document Released: 10/21/2002 Document Revised: 03/17/2015 Document Reviewed: 12/05/2014 Elsevier Interactive Patient Education 2016 [employer]  

## 2015-10-28 NOTE — Progress Notes (Signed)
Reason for visit: Gait disorder  Referring physician: Dr. Ladonna Snide is a 74 y.o. female  History of present illness:  Ms. Snidow is a 74 year old right-handed white female with a history of cerebrovascular disease. She had a stroke in 2008 and again in 2009. She was felt to have brainstem stroke events, with associated speech alteration, and some left-sided weakness. The patient has had a gait disorder since that time, she has more recently developed some low back pain, she is getting physical therapy for her low back and for her gait recently. The patient indicates that there has been some change in her walking since Thanksgiving of 2016, the daughter indicates there was a slow gradual alteration in walking over the last one year. There may have been some increased speech changes, but the patient herself does not believe that there has been any alteration in speech. The patient has not had any falls since 12/01/2014. The patient uses a walker at times for ambulation. She reports that her low back pain is present with standing, better with sitting or lying down. She denies any weakness or numbness in extremities that is new. She denies any headache, vision changes, or dizziness. She is not choking when she eats. She does have a history of chronic renal insufficiency. She has not had a scan of the brain since 2009. She denies any memory disturbance.  Past Medical History  Diagnosis Date  . Hypothyroidism   . GERD (gastroesophageal reflux disease)   . Hypercholesteremia   . Headache(784.0)   . Arthritis     right knee  . 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 Urinary incontinence   . Abdominal distention     past two months  . Weakness of left leg     Mildly, post stroke  . Hypertension   . Stroke ([place]) 02/2007, 08/2009    2 total -RESIDUAL WEAKNESS LEFT LEG--AND APHASIA  . Shortness of breath     WITH EXERTION  . Sinus drainage     PT STARTED ANTIBIOTIC 11/17/11 --IS HOARSE TODAY, SOME  WHEEZING AND COUGH WITH YELLOW DRAINAGE  . Chronic kidney disease     STAGE III KIDNEY DISEASE-PER PT--PT SEES DR. Risa Grill UROLOGIST  . Complication of anesthesia     PT REMEMBERS BREATHING PROBLEMS WAKING UP FROM KNEE SURGERY AT [facility] 2011 OR 2012  . Tumor of ovary   . Pneumonia   . Abnormality of gait 10/28/2015  . 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 Abdominal hysterectomy    . Tubal ligation    . Cholecystectomy    . Meniscus repair    . Tonsillectomy    . Toe surgery    . Laparotomy  11/22/2011    Procedure: EXPLORATORY LAPAROTOMY;  Surgeon: Imagene Gurney A. Alycia Rossetti, MD;  Location: [facility];  Service: Gynecology;  Laterality: N/A;  . Salpingoophorectomy  11/22/2011    Procedure: SALPINGO OOPHERECTOMY;  Surgeon: Imagene Gurney A. Alycia Rossetti, MD;  Location: [facility];  Service: Gynecology;  Laterality: Bilateral;    Family History  Problem Relation Age of Onset  . Kidney failure Mother   . Heart attack Father   . Heart failure Father   . Stomach cancer Other   . Stomach cancer Maternal Grandmother   . Stroke Paternal Grandmother     Social history:  reports that she quit smoking about 55 years ago.  She has never used smokeless tobacco. She reports that she drinks alcohol. She reports that she does not use illicit drugs.  Medications:  Prior to Admission medications   Medication Sig Start Date End Date Taking? Authorizing Provider  amLODipine (NORVASC) 10 MG tablet Take 10 mg by mouth daily with breakfast.    Yes Historical Provider, MD  aspirin 81 MG tablet Take 81 mg by mouth daily with breakfast.    Yes Historical Provider, MD  atorvastatin (LIPITOR) 40 MG tablet Take 40 mg by mouth at bedtime.    Yes Historical Provider, MD  dipyridamole-aspirin (AGGRENOX) 200-25 MG per 12 hr capsule Take 1 capsule by mouth 2 (two) times daily.   Yes Historical Provider, MD  furosemide  (LASIX) 20 MG tablet Take 1 tablet (20 mg total) by mouth daily. 09/07/13  Yes Domenic Polite, MD  HYDROcodone-acetaminophen (NORCO/VICODIN) 5-325 MG tablet Take 1 tablet by mouth every 8 (eight) hours as needed for moderate pain.   Yes Historical Provider, MD  levothyroxine (SYNTHROID, LEVOTHROID) 50 MCG tablet Take 50 mcg by mouth daily at 6 (six) AM.    Yes Historical Provider, MD  metoprolol (LOPRESSOR) 50 MG tablet Take 50 mg by mouth 2 (two) times daily. One at 9 am and at 8pm   Yes Historical Provider, MD  pantoprazole (PROTONIX) 40 MG tablet Take 40 mg by mouth daily.    Yes Historical Provider, MD  potassium chloride SA (K-DUR,KLOR-CON) 20 MEQ tablet Take 20 mEq by mouth 2 (two) times daily.   Yes Historical Provider, MD      Allergies  Allergen Reactions  . Amoxicillin-Pot Clavulanate     GI INTOLERANCE ONLY WITH DIARRHEA    ROS:  Out of a complete 14 system review of symptoms, the patient complains only of the following symptoms, and all other reviewed systems are negative.  Weight gain Easy bruising Snoring  Blood pressure 124/60, pulse 66, height 4\' 11"  (1.499 m), weight 185 lb (83.915 kg).  Physical Exam  General: The patient is alert and cooperative at the time of the examination. The patient is moderately obese.  Eyes: Pupils are equal, round, and reactive to light. Discs are flat bilaterally.  Neck: The neck is supple, no carotid bruits are noted.  Respiratory: The respiratory examination is clear.  Cardiovascular: The cardiovascular examination reveals a regular rate and rhythm, no obvious murmurs or rubs are noted.  Skin: Extremities are with 2+ edema below the knees bilaterally.  Neurologic Exam  Mental status: The patient is alert and oriented x 3 at the time of the examination. The patient has apparent normal recent and remote memory, with an apparently normal attention span and concentration ability.  Cranial nerves: Facial symmetry is present. There  is good sensation of the face to pinprick and soft touch bilaterally. The strength of the facial muscles and the muscles to head turning and shoulder shrug are normal bilaterally. Speech is monosyllabic, ataxic in nature, not aphasic. Extraocular movements are full. Visual fields are full. The tongue is midline, and the patient has symmetric elevation of the soft palate. No obvious hearing deficits are noted.  Motor: The motor testing reveals 5 over 5 strength of all 4 extremities. Good symmetric motor tone is noted throughout.  Sensory: Sensory testing is intact to pinprick, soft touch, vibration sensation, and position sense on all 4 extremities. No evidence of extinction is noted.  Coordination: Cerebellar testing reveals good finger-nose-finger and heel-to-shin bilaterally.  Gait and station: Gait is slightly wide-based, the  patient is able to walk independently, but she leans to the right. The pelvis is tilted to the right. Tandem gait is unsteady. Romberg is negative.  Reflexes: Deep tendon reflexes are symmetric, but are depressed bilaterally. Toes are downgoing bilaterally.   Assessment/Plan:  1. Cerebrovascular disease, residual speech disturbance and gait disorder  2. Gait instability, slightly progressive as per family  3. Chronic low back pain  The patient has not had an obvious sudden alteration in her ability to ambulate or speak. There may be some gradual change over time. The patient will be set up for MRI evaluation of the brain, and blood work will be done today. The patient will continue the physical therapy, she will follow-up through this office in 3-4 months.  Jill Alexanders MD 10/28/2015 6:48 PM  [facility] [address] [place], [place] [address]  Phone [phone] Fax [phone]

## 2015-10-30 LAB — COPPER, SERUM: Copper: 113 ug/dL (ref 72–166)

## 2015-10-31 LAB — METHYLMALONIC ACID, SERUM: METHYLMALONIC ACID: 316 nmol/L (ref 0–378)

## 2015-10-31 LAB — RPR: RPR: NONREACTIVE

## 2015-10-31 LAB — VITAMIN B12: Vitamin B-12: 702 pg/mL (ref 211–946)

## 2015-11-02 ENCOUNTER — Telehealth: Payer: Self-pay | Admitting: *Deleted

## 2015-11-02 NOTE — Telephone Encounter (Signed)
-----   Message from Kathrynn Ducking, MD sent at 11/01/2015  5:13 PM EST -----  The blood work results are unremarkable. Please call the patient.  ----- Message -----    From: [facility] Lab Results In Interface    Sent: 10/29/2015   7:42 AM      To: Kathrynn Ducking, MD

## 2015-11-02 NOTE — Telephone Encounter (Signed)
LVM for pt about unremarkable labs per Dr Jannifer Franklin. Gave [facility] phone number if she has further questions.

## 2015-11-05 ENCOUNTER — Telehealth: Payer: Self-pay | Admitting: Neurology

## 2015-11-05 NOTE — Telephone Encounter (Signed)
I called the patient. The MRI the brain was done at [facility]. This study shows an old left frontal stroke, no mention of a brainstem stroke. The patient does have severe white matter changes, this issue appears to have progressed some since the scan from October 2009. This may explain some of the speech and balance changes reported by the patient and the family. The patient appeared to have good blood pressure will last seen, she remains on aspirin and Aggrenox. She does have dyslipidemia. The patient has been set up for physical therapy, we will follow-up in the office.

## 2015-11-24 ENCOUNTER — Encounter: Payer: Self-pay | Admitting: Neurology

## 2016-04-05 ENCOUNTER — Ambulatory Visit: Payer: Medicare Other | Admitting: Neurology

## 2016-04-07 ENCOUNTER — Encounter: Payer: Self-pay | Admitting: Neurology

## 2016-04-07 ENCOUNTER — Ambulatory Visit (INDEPENDENT_AMBULATORY_CARE_PROVIDER_SITE_OTHER): Payer: Self-pay | Admitting: Neurology

## 2016-04-07 VITALS — BP 136/70 | HR 60 | Ht 59.0 in | Wt 187.0 lb

## 2016-04-07 DIAGNOSIS — R269 Unspecified abnormalities of gait and mobility: Secondary | ICD-10-CM | POA: Diagnosis not present

## 2016-04-07 DIAGNOSIS — I69359 Hemiplegia and hemiparesis following cerebral infarction affecting unspecified side: Secondary | ICD-10-CM

## 2016-04-07 DIAGNOSIS — I69328 Other speech and language deficits following cerebral infarction: Secondary | ICD-10-CM

## 2016-04-07 NOTE — Patient Instructions (Signed)
Fall Prevention in the Home  Falls can cause injuries and can affect people from all age groups. There are many simple things that you can do to make your home safe and to help prevent falls. WHAT CAN I DO ON THE OUTSIDE OF MY HOME?  Regularly repair the edges of walkways and driveways and fix any cracks.  Remove high doorway thresholds.  Trim any shrubbery on the main path into your home.  Use bright outdoor lighting.  Clear walkways of debris and clutter, including tools and rocks.  Regularly check that handrails are securely fastened and in good repair. Both sides of any steps should have handrails.  Install guardrails along the edges of any raised decks or porches.  Have leaves, snow, and ice cleared regularly.  Use sand or salt on walkways during winter months.  In the garage, clean up any spills right away, including grease or oil spills. WHAT CAN I DO IN THE BATHROOM?  Use night lights.  Install grab bars by the toilet and in the tub and shower. Do not use towel bars as grab bars.  Use non-skid mats or decals on the floor of the tub or shower.  If you need to sit down while you are in the shower, use a plastic, non-slip stool..  Keep the floor dry. Immediately clean up any water that spills on the floor.  Remove soap buildup in the tub or shower on a regular basis.  Attach bath mats securely with double-sided non-slip rug tape.  Remove throw rugs and other tripping hazards from the floor. WHAT CAN I DO IN THE BEDROOM?  Use night lights.  Make sure that a bedside light is easy to reach.  Do not use oversized bedding that drapes onto the floor.  Have a firm chair that has side arms to use for getting dressed.  Remove throw rugs and other tripping hazards from the floor. WHAT CAN I DO IN THE KITCHEN?   Clean up any spills right away.  Avoid walking on wet floors.  Place frequently used items in easy-to-reach places.  If you need to reach for something  above you, use a sturdy step stool that has a grab bar.  Keep electrical cables out of the way.  Do not use floor polish or wax that makes floors slippery. If you have to use wax, make sure that it is non-skid floor wax.  Remove throw rugs and other tripping hazards from the floor. WHAT CAN I DO IN THE STAIRWAYS?  Do not leave any items on the stairs.  Make sure that there are handrails on both sides of the stairs. Fix handrails that are broken or loose. Make sure that handrails are as long as the stairways.  Check any carpeting to make sure that it is firmly attached to the stairs. Fix any carpet that is loose or worn.  Avoid having throw rugs at the top or bottom of stairways, or secure the rugs with carpet tape to prevent them from moving.  Make sure that you have a light switch at the top of the stairs and the bottom of the stairs. If you do not have them, have them installed. WHAT ARE SOME OTHER FALL PREVENTION TIPS?  Wear closed-toe shoes that fit well and support your feet. Wear shoes that have rubber soles or low heels.  When you use a stepladder, make sure that it is completely opened and that the sides are firmly locked. Have someone hold the ladder while you   are using it. Do not climb a closed stepladder.  Add color or contrast paint or tape to grab bars and handrails in your home. Place contrasting color strips on the first and last steps.  Use mobility aids as needed, such as canes, walkers, scooters, and crutches.  Turn on lights if it is dark. Replace any light bulbs that burn out.  Set up furniture so that there are clear paths. Keep the furniture in the same spot.  Fix any uneven floor surfaces.  Choose a carpet design that does not hide the edge of steps of a stairway.  Be aware of any and all pets.  Review your medicines with your healthcare provider. Some medicines can cause dizziness or changes in blood pressure, which increase your risk of falling. Talk  with your health care provider about other ways that you can decrease your risk of falls. This may include working with a physical therapist or trainer to improve your strength, balance, and endurance.   This information is not intended to replace advice given to you by your health care provider. Make sure you discuss any questions you have with your health care provider.   Document Released: 10/21/2002 Document Revised: 03/17/2015 Document Reviewed: 12/05/2014 Elsevier Interactive Patient Education 2016 [employer]  

## 2016-04-07 NOTE — Progress Notes (Signed)
Reason for visit: Cerebrovascular disease  April Le is an 75 y.o. female  History of present illness:  April Le is a 75 year old right-handed white female with a history of cerebrovascular disease associated with a gait disorder and dysarthria. The patient has word finding issues as well. She has had some alteration in her balance recently, she was seen in December 2016. She was set up for physical therapy. MRI of the brain done did not show acute changes, but patient has had some extensive small vessel ischemic changes, with some progression over the last 5-7 years. The patient has been stable since she was seen here last. She did have a pneumonia in April 2017. She has recovered from this. She denies any issues with swallowing, she does have ongoing speech problems and difficulty with word finding. She uses a cane or walker in the home environment, she has not had any falls. She returns for an evaluation.  Past Medical History  Diagnosis Date  . Hypothyroidism   . GERD (gastroesophageal reflux disease)   . Hypercholesteremia   . Headache(784.0)   . Arthritis     right knee  . 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 Urinary incontinence   . Abdominal distention     past two months  . Weakness of left leg     Mildly, post stroke  . Hypertension   . Stroke ([place]) 02/2007, 08/2009    2 total -RESIDUAL WEAKNESS LEFT LEG--AND APHASIA  . Shortness of breath     WITH EXERTION  . Sinus drainage     PT STARTED ANTIBIOTIC 11/17/11 --IS HOARSE TODAY, SOME WHEEZING AND COUGH WITH YELLOW DRAINAGE  . Chronic kidney disease     STAGE III KIDNEY DISEASE-PER PT--PT SEES DR. Risa Grill UROLOGIST  . Complication of anesthesia     PT REMEMBERS BREATHING PROBLEMS WAKING UP FROM KNEE SURGERY AT [facility] 2011 OR 2012  . Tumor of ovary   . Pneumonia   . Abnormality of gait 10/28/2015  . 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 Abdominal hysterectomy    . Tubal ligation    . Cholecystectomy    . Meniscus repair    . Tonsillectomy    . Toe surgery    . Laparotomy  11/22/2011    Procedure: EXPLORATORY LAPAROTOMY;  Surgeon: Imagene Gurney A. Alycia Rossetti, MD;  Location[facility];  Service: Gynecology;  Laterality: N/A;  . Salpingoophorectomy  11/22/2011    Procedure: SALPINGO OOPHERECTOMY;  Surgeon: Imagene Gurney A. Alycia Rossetti, MD;  Location: [facility];  Service: Gynecology;  Laterality: Bilateral;    Family History  Problem Relation Age of Onset  . Kidney failure Mother   . Heart attack Father   . Heart failure Father   . Stomach cancer Other   . Stomach cancer Maternal Grandmother   . Stroke Paternal Grandmother     Social history:  reports that she quit smoking about 56 years ago. She has never used smokeless tobacco. She reports that she drinks alcohol. She reports that she does not use illicit drugs.    Allergies  Allergen Reactions  . Amoxicillin-Pot Clavulanate     GI INTOLERANCE ONLY WITH DIARRHEA    Medications:  Prior to Admission medications   Medication Sig Start Date End Date Taking? Authorizing Provider  amLODipine (NORVASC) 10 MG tablet Take 10 mg by mouth daily with breakfast.  Yes Historical Provider, MD  aspirin 81 MG tablet Take 81 mg by mouth daily with breakfast.    Yes Historical Provider, MD  atorvastatin (LIPITOR) 40 MG tablet Take 40 mg by mouth at bedtime.    Yes Historical Provider, MD  dipyridamole-aspirin (AGGRENOX) 200-25 MG per 12 hr capsule Take 1 capsule by mouth 2 (two) times daily.   Yes Historical Provider, MD  furosemide (LASIX) 20 MG tablet Take 1 tablet (20 mg total) by mouth daily. 09/07/13  Yes Domenic Polite, MD  HYDROcodone-acetaminophen (NORCO/VICODIN) 5-325 MG tablet Take 1 tablet by mouth every 8 (eight) hours as needed for moderate pain.   Yes Historical Provider, MD  levothyroxine (SYNTHROID, LEVOTHROID) 50 MCG tablet Take 50 mcg by mouth daily at 6  (six) AM.    Yes Historical Provider, MD  metoprolol (LOPRESSOR) 50 MG tablet Take 50 mg by mouth 2 (two) times daily. One at 9 am and at 8pm   Yes Historical Provider, MD  pantoprazole (PROTONIX) 40 MG tablet Take 40 mg by mouth daily.    Yes Historical Provider, MD  potassium chloride SA (K-DUR,KLOR-CON) 20 MEQ tablet Take 20 mEq by mouth 2 (two) times daily.   Yes Historical Provider, MD    ROS:  Out of a complete 14 system review of symptoms, the patient complains only of the following symptoms, and all other reviewed systems are negative.  Leg swelling Frequency of urination Walking difficulty Snoring Speech difficulty  Blood pressure 136/70, pulse 60, height 4\' 11"  (1.499 m), weight 187 lb (84.823 kg).  Physical Exam  General: The patient is alert and cooperative at the time of the examination. The patient is moderately to markedly obese.  Skin: No significant peripheral edema is noted.   Neurologic Exam  Mental status: The patient is alert and oriented x 3 at the time of the examination. The patient has apparent normal recent and remote memory, with an apparently normal attention span and concentration ability.   Cranial nerves: Facial symmetry is present. Speech is aphasic, dysarthric. Patient has significant word finding problems.. Extraocular movements are full. Visual fields are full.  Motor: The patient has good strength in all 4 extremities.  Sensory examination: Soft touch sensation is symmetric on the face, arms, and legs.  Coordination: The patient has good finger-nose-finger and heel-to-shin bilaterally.  Gait and station: The patient has a wide-based, unsteady gait. Patient has a tendency to lean to the right. Tandem gait was not attempted. Romberg is unsteady, the patient does not fall.  Reflexes: Deep tendon reflexes are symmetric.   Assessment/Plan:  1. Cerebrovascular disease  2. Gait disturbance  3. Aphasia, dysarthria  The patient is  clinically stable this point. She will continue her Aggrenox, she will follow-up through this office on an as-needed basis. If she believes that there are any changes in her underlying clinical condition she can be reevaluated. The gait disturbance and dysarthria are likely related to the fairly extensive small vessel ischemic changes present on MRI of the brain.  Jill Alexanders MD 04/07/2016 6:38 PM  [facility] [address] [place], [place] [address]  Phone [phone] Fax [phone]

## 2016-10-27 DIAGNOSIS — G4733 Obstructive sleep apnea (adult) (pediatric): Secondary | ICD-10-CM

## 2016-10-27 DIAGNOSIS — E119 Type 2 diabetes mellitus without complications: Secondary | ICD-10-CM | POA: Diagnosis not present

## 2016-10-27 DIAGNOSIS — D638 Anemia in other chronic diseases classified elsewhere: Secondary | ICD-10-CM

## 2016-10-27 DIAGNOSIS — J189 Pneumonia, unspecified organism: Secondary | ICD-10-CM | POA: Diagnosis not present

## 2016-10-27 DIAGNOSIS — I69359 Hemiplegia and hemiparesis following cerebral infarction affecting unspecified side: Secondary | ICD-10-CM

## 2016-10-27 DIAGNOSIS — N183 Chronic kidney disease, stage 3 (moderate): Secondary | ICD-10-CM

## 2016-10-27 DIAGNOSIS — R0902 Hypoxemia: Secondary | ICD-10-CM

## 2016-10-27 DIAGNOSIS — E039 Hypothyroidism, unspecified: Secondary | ICD-10-CM

## 2016-10-27 DIAGNOSIS — I10 Essential (primary) hypertension: Secondary | ICD-10-CM

## 2016-10-27 DIAGNOSIS — I5032 Chronic diastolic (congestive) heart failure: Secondary | ICD-10-CM

## 2016-10-27 DIAGNOSIS — K219 Gastro-esophageal reflux disease without esophagitis: Secondary | ICD-10-CM

## 2016-10-27 DIAGNOSIS — E785 Hyperlipidemia, unspecified: Secondary | ICD-10-CM

## 2016-10-27 DIAGNOSIS — J849 Interstitial pulmonary disease, unspecified: Secondary | ICD-10-CM

## 2016-10-28 DIAGNOSIS — J189 Pneumonia, unspecified organism: Secondary | ICD-10-CM | POA: Diagnosis not present

## 2016-10-28 DIAGNOSIS — R0902 Hypoxemia: Secondary | ICD-10-CM | POA: Diagnosis not present

## 2016-10-28 DIAGNOSIS — E119 Type 2 diabetes mellitus without complications: Secondary | ICD-10-CM | POA: Diagnosis not present

## 2016-10-28 DIAGNOSIS — K219 Gastro-esophageal reflux disease without esophagitis: Secondary | ICD-10-CM | POA: Diagnosis not present

## 2016-10-29 DIAGNOSIS — E119 Type 2 diabetes mellitus without complications: Secondary | ICD-10-CM | POA: Diagnosis not present

## 2016-10-29 DIAGNOSIS — J189 Pneumonia, unspecified organism: Secondary | ICD-10-CM | POA: Diagnosis not present

## 2016-10-29 DIAGNOSIS — K219 Gastro-esophageal reflux disease without esophagitis: Secondary | ICD-10-CM | POA: Diagnosis not present

## 2016-10-29 DIAGNOSIS — R0902 Hypoxemia: Secondary | ICD-10-CM | POA: Diagnosis not present

## 2016-10-29 DIAGNOSIS — L03116 Cellulitis of left lower limb: Secondary | ICD-10-CM

## 2016-10-30 DIAGNOSIS — K219 Gastro-esophageal reflux disease without esophagitis: Secondary | ICD-10-CM | POA: Diagnosis not present

## 2016-10-30 DIAGNOSIS — E119 Type 2 diabetes mellitus without complications: Secondary | ICD-10-CM | POA: Diagnosis not present

## 2016-10-30 DIAGNOSIS — J189 Pneumonia, unspecified organism: Secondary | ICD-10-CM | POA: Diagnosis not present

## 2016-10-30 DIAGNOSIS — R0902 Hypoxemia: Secondary | ICD-10-CM | POA: Diagnosis not present

## 2017-01-10 DIAGNOSIS — I110 Hypertensive heart disease with heart failure: Secondary | ICD-10-CM

## 2017-01-10 DIAGNOSIS — I2510 Atherosclerotic heart disease of native coronary artery without angina pectoris: Secondary | ICD-10-CM

## 2017-01-10 HISTORY — DX: Hypertensive heart disease with heart failure: I11.0

## 2017-01-10 HISTORY — DX: Atherosclerotic heart disease of native coronary artery without angina pectoris: I25.10

## 2017-09-01 DIAGNOSIS — I639 Cerebral infarction, unspecified: Secondary | ICD-10-CM

## 2017-09-01 DIAGNOSIS — E039 Hypothyroidism, unspecified: Secondary | ICD-10-CM

## 2017-09-01 DIAGNOSIS — K219 Gastro-esophageal reflux disease without esophagitis: Secondary | ICD-10-CM

## 2017-09-01 DIAGNOSIS — N189 Chronic kidney disease, unspecified: Secondary | ICD-10-CM | POA: Diagnosis not present

## 2017-09-01 DIAGNOSIS — I10 Essential (primary) hypertension: Secondary | ICD-10-CM

## 2017-09-01 DIAGNOSIS — I69359 Hemiplegia and hemiparesis following cerebral infarction affecting unspecified side: Secondary | ICD-10-CM

## 2017-09-01 DIAGNOSIS — E785 Hyperlipidemia, unspecified: Secondary | ICD-10-CM

## 2017-09-01 DIAGNOSIS — E119 Type 2 diabetes mellitus without complications: Secondary | ICD-10-CM

## 2017-09-01 DIAGNOSIS — I509 Heart failure, unspecified: Secondary | ICD-10-CM | POA: Diagnosis not present

## 2017-09-01 HISTORY — DX: Cerebral infarction, unspecified: I63.9

## 2017-09-02 DIAGNOSIS — I69359 Hemiplegia and hemiparesis following cerebral infarction affecting unspecified side: Secondary | ICD-10-CM | POA: Diagnosis not present

## 2017-09-02 DIAGNOSIS — I6789 Other cerebrovascular disease: Secondary | ICD-10-CM | POA: Diagnosis not present

## 2017-09-02 DIAGNOSIS — I639 Cerebral infarction, unspecified: Secondary | ICD-10-CM | POA: Diagnosis not present

## 2017-09-02 DIAGNOSIS — N189 Chronic kidney disease, unspecified: Secondary | ICD-10-CM | POA: Diagnosis not present

## 2017-09-02 DIAGNOSIS — I509 Heart failure, unspecified: Secondary | ICD-10-CM | POA: Diagnosis not present

## 2017-09-03 DIAGNOSIS — I639 Cerebral infarction, unspecified: Secondary | ICD-10-CM | POA: Diagnosis not present

## 2017-09-03 DIAGNOSIS — I69359 Hemiplegia and hemiparesis following cerebral infarction affecting unspecified side: Secondary | ICD-10-CM | POA: Diagnosis not present

## 2017-09-03 DIAGNOSIS — I509 Heart failure, unspecified: Secondary | ICD-10-CM | POA: Diagnosis not present

## 2017-09-03 DIAGNOSIS — N189 Chronic kidney disease, unspecified: Secondary | ICD-10-CM | POA: Diagnosis not present

## 2017-09-04 DIAGNOSIS — R0602 Shortness of breath: Secondary | ICD-10-CM | POA: Insufficient documentation

## 2017-09-04 DIAGNOSIS — N183 Chronic kidney disease, stage 3 unspecified: Secondary | ICD-10-CM | POA: Insufficient documentation

## 2017-09-04 DIAGNOSIS — E7800 Pure hypercholesterolemia, unspecified: Secondary | ICD-10-CM | POA: Insufficient documentation

## 2017-09-04 DIAGNOSIS — K219 Gastro-esophageal reflux disease without esophagitis: Secondary | ICD-10-CM | POA: Insufficient documentation

## 2017-09-04 DIAGNOSIS — J189 Pneumonia, unspecified organism: Secondary | ICD-10-CM | POA: Insufficient documentation

## 2017-09-04 DIAGNOSIS — I10 Essential (primary) hypertension: Secondary | ICD-10-CM | POA: Insufficient documentation

## 2017-09-04 DIAGNOSIS — D4959 Neoplasm of unspecified behavior of other genitourinary organ: Secondary | ICD-10-CM | POA: Insufficient documentation

## 2017-09-04 DIAGNOSIS — R4701 Aphasia: Secondary | ICD-10-CM

## 2017-09-04 DIAGNOSIS — T8859XA Other complications of anesthesia, initial encounter: Secondary | ICD-10-CM | POA: Insufficient documentation

## 2017-09-04 DIAGNOSIS — R32 Unspecified urinary incontinence: Secondary | ICD-10-CM | POA: Insufficient documentation

## 2017-09-04 DIAGNOSIS — R51 Headache: Secondary | ICD-10-CM

## 2017-09-04 DIAGNOSIS — M1990 Unspecified osteoarthritis, unspecified site: Secondary | ICD-10-CM | POA: Insufficient documentation

## 2017-09-04 DIAGNOSIS — T4145XA Adverse effect of unspecified anesthetic, initial encounter: Secondary | ICD-10-CM | POA: Insufficient documentation

## 2017-09-04 DIAGNOSIS — J3489 Other specified disorders of nose and nasal sinuses: Secondary | ICD-10-CM | POA: Insufficient documentation

## 2017-09-04 DIAGNOSIS — R29898 Other symptoms and signs involving the musculoskeletal system: Secondary | ICD-10-CM | POA: Insufficient documentation

## 2017-09-04 DIAGNOSIS — E039 Hypothyroidism, unspecified: Secondary | ICD-10-CM | POA: Insufficient documentation

## 2017-09-04 DIAGNOSIS — R519 Headache, unspecified: Secondary | ICD-10-CM | POA: Insufficient documentation

## 2017-09-04 DIAGNOSIS — R140 Abdominal distension (gaseous): Secondary | ICD-10-CM | POA: Insufficient documentation

## 2017-09-04 DIAGNOSIS — I639 Cerebral infarction, unspecified: Secondary | ICD-10-CM | POA: Insufficient documentation

## 2017-09-05 ENCOUNTER — Other Ambulatory Visit: Payer: Self-pay | Admitting: *Deleted

## 2017-09-07 NOTE — Progress Notes (Deleted)
Cardiology Office Note:    Date:  09/07/2017   ID:  April Le, DOB [birth year], MRN [id number]  PCP:  Ronita Hipps, MD  Cardiologist:  Shirlee More, MD    Referring MD: Ronita Hipps, MD    ASSESSMENT:    No diagnosis found. PLAN:    In order of problems listed above:  1. ***   Next appointment: ***   Medication Adjustments/Labs and Tests Ordered: Current medicines are reviewed at length with the patient today.  Concerns regarding medicines are outlined above.  No orders of the defined types were placed in this encounter.  No orders of the defined types were placed in this encounter.   No chief complaint on file.   History of Present Illness:    April Le is a 76 y.o. female with a hx of CHF, Dyslipidemia, HTN  with recent stroke admitted to [facility] 09/01/17 to 09/03/17 Compliance with diet, lifestyle and medications: *** Past Medical History:  Diagnosis Date  . Abdominal distention    past two months  . Abnormality of gait 10/28/2015  . Aphasia due to acute stroke ([place])   . 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 Arthritis    right knee  . Benign essential HTN 11/19/2013  . CAP (community acquired pneumonia) 09/07/2013  . Chronic diastolic heart failure ([place]) 11/19/2013  . Chronic kidney disease    STAGE III KIDNEY DISEASE-PER PT--PT SEES DR. Risa Grill UROLOGIST  . Complication of anesthesia    PT REMEMBERS BREATHING PROBLEMS WAKING UP FROM KNEE SURGERY AT [facility] 2011 OR 2012  . Coronary artery calcification seen on CT scan 01/10/2017  . Fever of unknown origin 09/04/2013  . GERD (gastroesophageal reflux disease)   . Headache   . Headache(784.0)   . 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 Hypercholesteremia   . Hypertension   . 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 Hypothyroidism   . Hypoxia 09/04/2013  . Leukocytosis 09/04/2013  . Mass of ovary 10/26/2011  . OSA  (obstructive sleep apnea) 11/19/2013  . Pneumonia   . Shortness of breath    WITH EXERTION  . Sinus drainage    PT STARTED ANTIBIOTIC 11/17/11 --IS HOARSE TODAY, SOME WHEEZING AND COUGH WITH YELLOW DRAINAGE  . SIRS (systemic inflammatory response syndrome) ([place]) 09/04/2013  . SOB (shortness of breath) 09/04/2013  . Stroke ([place]) 02/2007, 08/2009   2 total -RESIDUAL WEAKNESS LEFT LEG--AND APHASIA  . Tumor of ovary   . Urinary incontinence   . Urinary incontinence   . Weakness of left leg    Mildly, post stroke    Past Surgical History:  Procedure Laterality Date  . ABDOMINAL HYSTERECTOMY    . CHOLECYSTECTOMY    . LAPAROTOMY  11/22/2011   Procedure: EXPLORATORY LAPAROTOMY;  Surgeon: Imagene Gurney A. Alycia Rossetti, MD;  Location: [facility];  Service: Gynecology;  Laterality: N/A;  . MENISCUS REPAIR    . SALPINGOOPHORECTOMY  11/22/2011   Procedure: SALPINGO OOPHERECTOMY;  Surgeon: Imagene Gurney A. Alycia Rossetti, MD;  Location: [facility];  Service: Gynecology;  Laterality: Bilateral;  . TOE SURGERY    . TONSILLECTOMY    . TUBAL LIGATION      Current Medications: No outpatient prescriptions have been marked as taking for the 09/08/17 encounter (Appointment) with Richardo Priest, MD.     Allergies:   Amoxicillin-pot clavulanate   Social History   Social History  . Marital status: Married  Spouse name: N/A  . Number of children: 2  . Years of education: 13   Occupational History  . retired    Social History Main Topics  . Smoking status: Former Smoker    Types: Cigarettes    Quit date: 11/15/1959  . Smokeless tobacco: Never Used  . Alcohol use 0.0 oz/week     Comment: very rarely  . Drug use: No  . Sexual activity: No   Other Topics Concern  . Not on file   Social History Narrative   Patient does not drink caffeine.   Patient is right handed.      Family History: The patient's ***family history includes Heart attack in her father; Heart failure in her father; Kidney failure in her mother; Stomach cancer  in her maternal grandmother and other; Stroke in her paternal grandmother. ROS:   Please see the history of present illness.    All other systems reviewed and are negative.  EKGs/Labs/Other Studies Reviewed:    The following studies were reviewed today:  EKG:  EKG 09/01/17 [place] 62 BPM normal EKG with marked baseline wander Echo: EF 60-65%  Recent Labs: No results found for requested labs within last 8760 hours.  Recent Lipid Panel No results found for: CHOL, TRIG, HDL, CHOLHDL, VLDL, LDLCALC, LDLDIRECT  Physical Exam:    VS:  There were no vitals taken for this visit.    Wt Readings from Last 3 Encounters:  04/07/16 187 lb (84.8 kg)  10/28/15 185 lb (83.9 kg)  11/19/13 167 lb (75.8 kg)     GEN: *** Well nourished, well developed in no acute distress HEENT: Normal NECK: No JVD; No carotid bruits LYMPHATICS: No lymphadenopathy CARDIAC: ***RRR, no murmurs, rubs, gallops RESPIRATORY:  Clear to auscultation without rales, wheezing or rhonchi  ABDOMEN: Soft, non-tender, non-distended MUSCULOSKELETAL:  No edema; No deformity  SKIN: Warm and dry NEUROLOGIC:  Alert and oriented x 3 PSYCHIATRIC:  Normal affect    Signed, Shirlee More, MD  09/07/2017 1:23 PM    [facility]

## 2017-09-08 ENCOUNTER — Ambulatory Visit: Payer: Medicare Other | Admitting: Cardiology

## 2017-09-08 NOTE — Progress Notes (Signed)
Cardiology Office Note:    Date:  09/11/2017   ID:  April Le, DOB October 11, 1941, MRN [id number]  PCP:  April Hipps, MD  Cardiologist:  April More, MD    Referring MD: April Hipps, MD    ASSESSMENT:    1. Cerebrovascular accident (CVA), unspecified mechanism ([place])   2. Chronic diastolic heart failure ([place])   3. Hypertensive heart disease with heart failure ([place])    PLAN:    In order of problems listed above:  1. Continue current treatment 30-day event monitor if unrevealing then I will refer her for an implanted loop recorder.  If atrial fibrillation is documented anticoagulant therapy would be appropriate. 2. Stable compensated continue her current loop diuretic and antihypertensives 3. Stable blood pressure at target continue current treatment   Next appointment: 6 weeks   Medication Adjustments/Labs and Tests Ordered: Current medicines are reviewed at length with the patient today.  Concerns regarding medicines are outlined above.  No orders of the defined types were placed in this encounter.  No orders of the defined types were placed in this encounter.   No chief complaint on file.   History of Present Illness:    April Le is a 76 y.o. female with a hx of CHF, Dyslipidemia, HTN , CKD and recent admission at [facility] with stroke  last seen 01/12/17.  She was advised an event monitor for stroke evaluation.  She had no arrhythmia documented in hospital or cardiac source of embolism on echo. Both her admission note and ED note that she had atrial fibrillation previously, I have no documentation.  2 EKGs from 2014 shows sinus rhythm.The discharge summary recommends both 30-day ambulatory event monitor as well as an implanted loop recorder for cryptogenic stroke.    She was readmitted to [facility] 09/09/17: Hospital Course - April Robinsons, MD - 09/10/2017 3:19 PM EDT April Le a 76 y.o.femalewith a past medical history significant for pulmonary fibrosis,  hypertension, hypothyroid diabetes, gout, CKD stage III, and prior left MCA stroke, subacute right posterior frontal cortical infarctwho presents with headache and left upper extremity weakness who was found to have new right PCA ischemic stroke transferred to [facility] on 10/27 with concern for hemorrhagic conversion which was found to be absent. Ischemic stroke: The patient had a previous left MCA ischemic stroke and subacute right posterior frontal cortical infarct (based on outside hospital imaging read) with residual deficits of speech difficulty described as slurred speech and word finding difficulty as well as weakness of the left upper and lower extremity. Patient had full stroke workup done at [facility], and was transferred to [facility] due to concern for hemorrhagic conversion; however records regarding the stroke workup were not shared on transfer. MRI read from the outside hospital was read as "since the previous MRI 1 week ago, the patient has developed an acute distal right PCA territory infarction, inferior and posterior temporal and occipital lobes, with gyriform reperfusion hemorrhage in its central most aspect. Stable and unchanged subacute right posterior frontal cortical infarct, with no intervening hemorrhage. Chronic left MCA territory infarct." Given this read, and that patient's exam was stable from previous hospitalization without any new deficits, it is unlikely there was any hemorrhagic conversion, and even if there was, it has not caused any new symptomatic changes that would be intervened upon. Patient's primary stroke risk factors are diabetes mellitus and hypertension.Patient was continued on aspirin and plavix which she was started on by previous doctors. Atorvastatin 40 mg daily  was added here for secondary stroke prevention. No arrythmias seen on telemetry. Per patient, she may have received echo at [facility], and has a cardiologist who will do further cardiac workup. Physical  and occupational therapy evaluations recommended that patient was safe for discharge home with home health. Patient will follow up with [facility] in 4-6 weeks. Family was instructed to bring all previous stroke workup documentation at that time.     Compliance with diet, lifestyle and medications: Yes  Her admission to [facility] relates that she had atrial fibrillation.  She has no previous history of sinus rhythm in the emergency room.  She had no documented arrhythmia in the hospital and was advised both a 30-day event monitor and an implanted loop recorder.  She has had no episodes of rapid heart rhythm chest pain palpitation syncope or shortness of breath.  I met with the patient husband son will start with a 30-day event monitor if unrevealing then refer for loop recorder.  In the interim she will continue her current medical treatment after stroke including both aspirin and clopidogrel along with her antihypertensives and a high intensity statin. Past Medical History:  Diagnosis Date  . Abdominal distention    past two months  . Abnormality of gait 10/28/2015  . 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 Arthritis    right knee  . Benign essential HTN 11/19/2013  . CAP (community acquired pneumonia) 09/07/2013  . Chronic diastolic heart failure ([place]) 11/19/2013  . Chronic kidney disease    STAGE III KIDNEY DISEASE-PER PT--PT SEES DR. Risa Grill UROLOGIST  . Complication of anesthesia    PT REMEMBERS BREATHING PROBLEMS WAKING UP FROM KNEE SURGERY AT [facility] 2011 OR 2012  . Coronary artery calcification seen on CT scan 01/10/2017  . Fever of unknown origin 09/04/2013  . GERD (gastroesophageal reflux disease)   . Headache   . Headache(784.0)   . 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 Hypercholesteremia   . Hypertension   . Hypertensive heart disease with heart failure ([place])  01/10/2017  . Hypothyroidism   . Hypoxia 09/04/2013  . Leukocytosis 09/04/2013  . Mass of ovary 10/26/2011  . OSA (obstructive sleep apnea) 11/19/2013  . Pneumonia   . Shortness of breath    WITH EXERTION  . Sinus drainage    PT STARTED ANTIBIOTIC 11/17/11 --IS HOARSE TODAY, SOME WHEEZING AND COUGH WITH YELLOW DRAINAGE  . SIRS (systemic inflammatory response syndrome) ([place]) 09/04/2013  . SOB (shortness of breath) 09/04/2013  . Stroke ([place]) 02/2007, 08/2009   2 total -RESIDUAL WEAKNESS LEFT LEG--AND APHASIA  . Tumor of ovary   . Urinary incontinence   . Urinary incontinence   . Weakness of left leg    Mildly, post stroke    Past Surgical History:  Procedure Laterality Date  . ABDOMINAL HYSTERECTOMY    . CHOLECYSTECTOMY    . LAPAROTOMY  11/22/2011   Procedure: EXPLORATORY LAPAROTOMY;  Surgeon: Imagene Gurney A. Alycia Rossetti, MD;  Location: [facility];  Service: Gynecology;  Laterality: N/A;  . MENISCUS REPAIR    . SALPINGOOPHORECTOMY  11/22/2011   Procedure: SALPINGO OOPHERECTOMY;  Surgeon: Imagene Gurney A. Alycia Rossetti, MD;  Location: [facility];  Service: Gynecology;  Laterality: Bilateral;  . TOE SURGERY    . TONSILLECTOMY    . TUBAL LIGATION      Current Medications: No outpatient prescriptions have been marked as  taking for the 09/11/17 encounter (Appointment) with Richardo Priest, MD.     Allergies:   Amoxicillin-pot clavulanate   Social History   Social History  . Marital status: Married    Spouse name: N/A  . Number of children: 2  . Years of education: 13   Occupational History  . retired    Social History Main Topics  . Smoking status: Former Smoker    Types: Cigarettes    Quit date: 11/15/1959  . Smokeless tobacco: Never Used  . Alcohol use 0.0 oz/week     Comment: very rarely  . Drug use: No  . Sexual activity: No   Other Topics Concern  . Not on file   Social History Narrative   Patient does not drink caffeine.   Patient is right handed.      Family History: The patient's family  history includes Heart attack in her father; Heart failure in her father; Kidney failure in her mother; Stomach cancer in her maternal grandmother and other; Stroke in her paternal grandmother. ROS:   Please see the history of present illness.    All other systems reviewed and are negative.  EKGs/Labs/Other Studies Reviewed:    The following studies were reviewed today:  EKG:  EKG ordered today.  The ekg ordered today demonstrates sinus rhythm and is normal Echo: EF 60-65%, mild MR and TR Recent Labs: CBC and BMP normal No results found for requested labs within last 8760 hours.  Recent Lipid Panel Chol 1341HDL 39 LDL 45 No results found for: CHOL, TRIG, HDL, CHOLHDL, VLDL, LDLCALC, LDLDIRECT  Physical Exam:    VS:  There were no vitals taken for this visit.    Wt Readings from Last 3 Encounters:  04/07/16 187 lb (84.8 kg)  10/28/15 185 lb (83.9 kg)  11/19/13 167 lb (75.8 kg)     GEN: She has dysarthria well nourished, well developed in no acute distress HEENT: Normal NECK: No JVD; No carotid bruits LYMPHATICS: No lymphadenopathy CARDIAC: RRR, no murmurs, rubs, gallops RESPIRATORY:  Clear to auscultation without rales, wheezing or rhonchi  ABDOMEN: Soft, non-tender, non-distended MUSCULOSKELETAL:  No edema; No deformity  SKIN: Warm and dry NEUROLOGIC:  Alert and oriented x 3 PSYCHIATRIC:  Normal affect    Signed, April More, MD  09/11/2017 2:04 PM    [place]

## 2017-09-11 ENCOUNTER — Encounter: Payer: Self-pay | Admitting: Cardiology

## 2017-09-11 ENCOUNTER — Ambulatory Visit (INDEPENDENT_AMBULATORY_CARE_PROVIDER_SITE_OTHER): Payer: Medicare Other | Admitting: Cardiology

## 2017-09-11 VITALS — BP 102/70 | HR 58 | Ht 59.0 in | Wt 159.0 lb

## 2017-09-11 DIAGNOSIS — E7800 Pure hypercholesterolemia, unspecified: Secondary | ICD-10-CM | POA: Diagnosis not present

## 2017-09-11 DIAGNOSIS — I639 Cerebral infarction, unspecified: Secondary | ICD-10-CM

## 2017-09-11 DIAGNOSIS — I5032 Chronic diastolic (congestive) heart failure: Secondary | ICD-10-CM

## 2017-09-11 DIAGNOSIS — I110 Hypertensive heart disease with heart failure: Secondary | ICD-10-CM

## 2017-09-11 NOTE — Patient Instructions (Signed)
Medication Instructions:  Your physician recommends that you continue on your current medications as directed. Please refer to the Current Medication list given to you today.  Labwork: None  Testing/Procedures: Your physician has recommended that you wear an event monitor. Event monitors are medical devices that record the heart's electrical activity. Doctors most often [place] these monitors to diagnose arrhythmias. Arrhythmias are problems with the speed or rhythm of the heartbeat. The monitor is a small, portable device. You can wear one while you do your normal daily activities. This is usually used to diagnose what is causing palpitations/syncope (passing out). 30 days.  Follow-Up: Your physician recommends that you schedule a follow-up appointment in: 6 weeks.  Any Other Special Instructions Will Be Listed Below (If Applicable).     If you need a refill on your cardiac medications before your next appointment, please call your pharmacy.

## 2017-09-19 ENCOUNTER — Ambulatory Visit: Payer: Medicare Other

## 2017-10-16 NOTE — Progress Notes (Signed)
Cardiology Office Note:    Date:  10/17/2017   ID:  April Le, DOB 07-18-1941, MRN [id number]  PCP:  April Hipps, MD  Cardiologist:  April More, MD    Referring MD: April Hipps, MD    ASSESSMENT:    1. Cerebrovascular accident (CVA), unspecified mechanism ([place])   2. Hypertensive heart disease with heart failure ([place])    PLAN:    In order of problems listed above:  1. Stable, I reviewed her records and despite the documentation in the admission to [facility] that she has a history of atrial fibrillation I cannot find it anywhere in her medical records.  She will finish her event monitor later this week she has had no episodes.  Her evaluation showed diffuse small vessel CNS disease.  At this time I would not advise her to have an implanted loop recorder.  I reviewed his status with the patient and her family and they are in agreement.  I will asked him to consider getting a cardia to screen for atrial fibrillation office smart phone.  She will continue medical treatment with dual antiplatelet and statin. 2. Stable continue her diuretic and beta-blocker.   Next appointment: 6 months   Medication Adjustments/Labs and Tests Ordered: Current medicines are reviewed at length with the patient today.  Concerns regarding medicines are outlined above.  No orders of the defined types were placed in this encounter.  No orders of the defined types were placed in this encounter.   Chief Complaint  Patient presents with  . Follow-up    Event Monitor    History of Present Illness:    April Le is a 76 y.o. female with a hx of  CHF, Dyslipidemia, HTN , CKD and  admission at [facility] with stroke  She was advised an event monitor for stroke evaluation.  She had no arrhythmia documented in hospital or cardiac source of embolism on echo.Both her admission note and ED note that she had atrial fibrillation previously, I have no documentation.  2 EKGs from 2014 shows sinus rhythm.The  discharge summary recommends both 30-day ambulatory event monitor as well as an implanted loop recorder for cryptogenic stroke.  She was  last seen 5 weeks ago.Her EM to date shows no episodes of PAF. TTE shows normal LA and RA size and PR interval is normal on recent EKG.MRI /MRA showed moderate to severe chronic small vessel disease. Compliance with diet, lifestyle and medications: Yes She is pleased with the quality of life and feels that she continues to improve with speech and physical therapy.  She has had no documented arrhythmia to date no palpitation shortness of breath or chest pain Past Medical History:  Diagnosis Date  . Abdominal distention    past two months  . Abnormality of gait 10/28/2015  . 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 Arthritis    right knee  . Benign essential HTN 11/19/2013  . CAP (community acquired pneumonia) 09/07/2013  . Chronic diastolic heart failure ([place]) 11/19/2013  . Chronic kidney disease    STAGE III KIDNEY DISEASE-PER PT--PT SEES DR. Risa Grill UROLOGIST  . Complication of anesthesia    PT REMEMBERS BREATHING PROBLEMS WAKING UP FROM KNEE SURGERY AT [facility] 2011 OR 2012  . Coronary artery calcification seen on CT scan 01/10/2017  . Fever of unknown origin 09/04/2013  . GERD (gastroesophageal reflux disease)   . Headache   . Headache(784.0)   .  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 Hypercholesteremia   . Hypertension   . Hypertensive heart disease with heart failure ([place]) 01/10/2017  . Hypothyroidism   . Hypoxia 09/04/2013  . Leukocytosis 09/04/2013  . Mass of ovary 10/26/2011  . OSA (obstructive sleep apnea) 11/19/2013  . Pneumonia   . Shortness of breath    WITH EXERTION  . Sinus drainage    PT STARTED ANTIBIOTIC 11/17/11 --IS HOARSE TODAY, SOME WHEEZING AND COUGH WITH YELLOW DRAINAGE  . SIRS (systemic inflammatory response syndrome) ([place]) 09/04/2013  .  SOB (shortness of breath) 09/04/2013  . Stroke ([place]) 02/2007, 08/2009   2 total -RESIDUAL WEAKNESS LEFT LEG--AND APHASIA  . Stroke ([place]) 09/01/2017  . Stroke ([place]) 09/08/2017  . Tumor of ovary   . Urinary incontinence   . Urinary incontinence   . Weakness of left leg    Mildly, post stroke    Past Surgical History:  Procedure Laterality Date  . ABDOMINAL HYSTERECTOMY    . CHOLECYSTECTOMY    . LAPAROTOMY  11/22/2011   Procedure: EXPLORATORY LAPAROTOMY;  Surgeon: Imagene Gurney A. Alycia Rossetti, MD;  Location: [facility];  Service: Gynecology;  Laterality: N/A;  . MENISCUS REPAIR    . SALPINGOOPHORECTOMY  11/22/2011   Procedure: SALPINGO OOPHERECTOMY;  Surgeon: Imagene Gurney A. Alycia Rossetti, MD;  Location: [facility];  Service: Gynecology;  Laterality: Bilateral;  . TOE SURGERY    . TONSILLECTOMY    . TUBAL LIGATION      Current Medications: Current Meds  Medication Sig  . amLODipine (NORVASC) 10 MG tablet Take 10 mg by mouth daily with breakfast.   . aspirin 81 MG tablet Take 81 mg by mouth daily with breakfast.   . atorvastatin (LIPITOR) 40 MG tablet Take 40 mg by mouth at bedtime.   . clopidogrel (PLAVIX) 75 MG tablet Take 75 mg by mouth.  . febuxostat (ULORIC) 40 MG tablet Take 40 mg by mouth.  . furosemide (LASIX) 40 MG tablet Take 40 mg by mouth.  . levothyroxine (SYNTHROID, LEVOTHROID) 50 MCG tablet Take 50 mcg by mouth daily at 6 (six) AM.   . Melatonin 3 MG TABS Take 6 mg by mouth.  . metoprolol (LOPRESSOR) 50 MG tablet Take 50 mg by mouth 2 (two) times daily. One at 9 am and at 8pm  . pantoprazole (PROTONIX) 40 MG tablet Take 40 mg by mouth daily.   . potassium chloride SA (K-DUR,KLOR-CON) 20 MEQ tablet Take by mouth.  . sitaGLIPtin (JANUVIA) 50 MG tablet Take 50 mg by mouth.     Allergies:   Amoxicillin-pot clavulanate   Social History   Socioeconomic History  . Marital status: Married    Spouse name: None  . Number of children: 2  . Years of education: 53  . Highest education level: None  Social  Needs  . Financial resource strain: None  . Food insecurity - worry: None  . Food insecurity - inability: None  . Transportation needs - medical: None  . Transportation needs - non-medical: None  Occupational History  . Occupation: retired  Tobacco Use  . Smoking status: Former Smoker    Types: Cigarettes    Last attempt to quit: 11/15/1959    Years since quitting: 57.9  . Smokeless tobacco: Never Used  Substance and Sexual Activity  . Alcohol use: Yes    Alcohol/week: 0.0 oz    Comment: very rarely  . Drug use: No  . Sexual activity: No  Other  Topics Concern  . None  Social History Narrative   Patient does not drink caffeine.   Patient is right handed.      Family History: The patient's family history includes Heart attack in her father; Heart failure in her father; Kidney failure in her mother; Stomach cancer in her maternal grandmother and other; Stroke in her paternal grandmother. ROS:   Please see the history of present illness.    All other systems reviewed and are negative.  EKGs/Labs/Other Studies Reviewed:    The following studies were reviewed today:  Recent Labs: No results found for requested labs within last 8760 hours.  Recent Lipid Panel No results found for: CHOL, TRIG, HDL, CHOLHDL, VLDL, LDLCALC, LDLDIRECT  Physical Exam:    VS:  BP 130/68 (BP Location: Left Arm, Patient Position: Sitting, Cuff Size: Normal)   Pulse (!) 59   Ht 4\' 11"  (1.499 m)   Wt 163 lb (73.9 kg)   SpO2 93%   BMI 32.92 kg/m     Wt Readings from Last 3 Encounters:  10/17/17 163 lb (73.9 kg)  09/11/17 159 lb (72.1 kg)  04/07/16 187 lb (84.8 kg)     GEN:  Well nourished, well developed in no acute distress HEENT: Normal NECK: No JVD; No carotid bruits LYMPHATICS: No lymphadenopathy CARDIAC: RRR, no murmurs, rubs, gallops RESPIRATORY:  Clear to auscultation without rales, wheezing or rhonchi  ABDOMEN: Soft, non-tender, non-distended MUSCULOSKELETAL:  No edema; No  deformity  SKIN: Warm and dry NEUROLOGIC:  Alert and oriented x 3 PSYCHIATRIC:  Normal affect    Signed, April More, MD  10/17/2017 3:03 PM    [facility]

## 2017-10-17 ENCOUNTER — Encounter: Payer: Self-pay | Admitting: Cardiology

## 2017-10-17 ENCOUNTER — Ambulatory Visit: Payer: Medicare Other | Admitting: Cardiology

## 2017-10-17 VITALS — BP 130/68 | HR 59 | Ht 59.0 in | Wt 163.0 lb

## 2017-10-17 DIAGNOSIS — I639 Cerebral infarction, unspecified: Secondary | ICD-10-CM | POA: Diagnosis not present

## 2017-10-17 DIAGNOSIS — I110 Hypertensive heart disease with heart failure: Secondary | ICD-10-CM | POA: Diagnosis not present

## 2017-10-17 NOTE — Patient Instructions (Addendum)
Medication Instructions:  Your physician recommends that you continue on your current medications as directed. Please refer to the Current Medication list given to you today.  Labwork: None  Testing/Procedures: None  Follow-Up: Your physician recommends that you schedule a follow-up appointment in: 6 months  Any Other Special Instructions Will Be Listed Below (If Applicable).     If you need a refill on your cardiac medications before your next appointment, please call your pharmacy.   [place], RN, BSN     Kardia[employer] [email or web address] Check your heart rhythm frequently, call if it says atrial fibrillation       FDA-cleared, clinical grade mobile EKG monitor: Jodelle Red is the most clinically-validated mobile EKG used by the world's leading cardiac care medical professionals With Basic service, know instantly if your heart rhythm is normal or if atrial fibrillation is detected, and email the last single EKG recording to yourself or your doctor Premium service, available for purchase through the K[employer]ia app for $9.99 per month or $99 per year, includes unlimited history and storage of your EKG recordings, a monthly EKG summary report to share with your doctor, along with the ability to track your blood pressure, activity and weight Includes one [employer]Mobile phone clip FREE SHIPPING: Standard delivery 1-3 business days. Orders placed by 11:00am PST will ship that afternoon. Otherwise, will ship next business day. All orders ship via [employer] from [place], [place]

## 2017-10-23 ENCOUNTER — Ambulatory Visit: Payer: Medicare Other | Admitting: Cardiology

## 2018-05-22 ENCOUNTER — Ambulatory Visit: Payer: Medicare Other | Admitting: Cardiology

## 2018-06-14 ENCOUNTER — Encounter: Payer: Self-pay | Admitting: Cardiology

## 2018-06-14 ENCOUNTER — Ambulatory Visit: Payer: Medicare Other | Admitting: Cardiology

## 2018-06-14 VITALS — BP 130/80 | HR 49 | Ht 59.0 in

## 2018-06-14 DIAGNOSIS — N183 Chronic kidney disease, stage 3 unspecified: Secondary | ICD-10-CM

## 2018-06-14 DIAGNOSIS — E7800 Pure hypercholesterolemia, unspecified: Secondary | ICD-10-CM

## 2018-06-14 DIAGNOSIS — I639 Cerebral infarction, unspecified: Secondary | ICD-10-CM | POA: Diagnosis not present

## 2018-06-14 DIAGNOSIS — I110 Hypertensive heart disease with heart failure: Secondary | ICD-10-CM | POA: Diagnosis not present

## 2018-06-14 DIAGNOSIS — I5032 Chronic diastolic (congestive) heart failure: Secondary | ICD-10-CM

## 2018-06-14 NOTE — Patient Instructions (Addendum)
Medication Instructions:  Your physician recommends that you continue on your current medications as directed. Please refer to the Current Medication list given to you today.  Labwork: None  Testing/Procedures: None  Follow-Up: Your physician recommends that you schedule a follow-up appointment in: 6 months  Any Other Special Instructions Will Be Listed Below (If Applicable).     If you need a refill on your cardiac medications before your next appointment, please call your pharmacy.   [place], RN, BSN   KardiaMobile [email or web address]        FDA-cleared, clinical grade mobile EKG monitor: Jodelle Red is the most clinically-validated mobile EKG used by the world's leading cardiac care medical professionals With Basic service, know instantly if your heart rhythm is normal or if atrial fibrillation is detected, and email the last single EKG recording to yourself or your doctor Premium service, available for purchase through the K[employer]ia app for $9.99 per month or $99 per year, includes unlimited history and storage of your EKG recordings, a monthly EKG summary report to share with your doctor, along with the ability to track your blood pressure, activity and weight Includes one [employer]ia[employer] phone clip FREE SHIPPING: Standard delivery 1-3 business days. Orders placed by 11:00am PST will ship that afternoon. Otherwise, will ship next business day. All orders ship via [employer] from [place], [place]

## 2018-06-14 NOTE — Progress Notes (Signed)
Cardiology Office Note:    Date:  06/14/2018   ID:  April Le, DOB Feb 06, 1941, MRN [id number]  PCP:  Ronita Hipps, MD  Cardiologist:  Shirlee More, MD    Referring MD: Ronita Hipps, MD    ASSESSMENT:    1. Chronic diastolic heart failure ([place])   2. Hypertensive heart disease with heart failure ([place])   3. Cerebrovascular accident (CVA), unspecified mechanism ([place])   4. Hypercholesteremia   5. CKD (chronic kidney disease), stage III ([place])    PLAN:    In order of problems listed above:  1. Stable nicely compensated continue current dose of loop diuretic.  Recent labs for renal function potassium requested from her PCP 2. Stable continue current treatment beta-blocker diuretic 3. Stable I asked her to consider getting a iPhone adapter to do EKG screenings and in lieu of that they will check blood pressure several days a week looking for an alarm for rapid irregular heart rhythm and sugar evaluation further for atrial fibrillation 4. Stable continue her statin recent labs for liver function lipid profile requested 5. Stable recent renal function requested   Next appointment: 6 months   Medication Adjustments/Labs and Tests Ordered: Current medicines are reviewed at length with the patient today.  Concerns regarding medicines are outlined above.  No orders of the defined types were placed in this encounter.  No orders of the defined types were placed in this encounter.   Chief Complaint  Patient presents with  . Follow-up    History of Present Illness:    April Le is a 77 y.o. female with a hx of heart failure, hypertension stage 3 CKD and hyyperlipidemia last seen 10/17/17. Compliance with diet, lifestyle and medications: Yes Past Medical History:  Diagnosis Date  . Abdominal distention    past two months  . Abnormality of gait 10/28/2015  . 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 Arthritis    right knee  . Benign essential HTN  11/19/2013  . CAP (community acquired pneumonia) 09/07/2013  . Chronic diastolic heart failure ([place]) 11/19/2013  . Chronic kidney disease    STAGE III KIDNEY DISEASE-PER PT--PT SEES DR. Risa Grill UROLOGIST  . Complication of anesthesia    PT REMEMBERS BREATHING PROBLEMS WAKING UP FROM KNEE SURGERY AT [facility] 2011 OR 2012  . Coronary artery calcification seen on CT scan 01/10/2017  . Fever of unknown origin 09/04/2013  . GERD (gastroesophageal reflux disease)   . Headache   . Headache(784.0)   . 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 Hypercholesteremia   . Hypertension   . Hypertensive heart disease with heart failure ([place]) 01/10/2017  . Hypothyroidism   . Hypoxia 09/04/2013  . Leukocytosis 09/04/2013  . Mass of ovary 10/26/2011  . OSA (obstructive sleep apnea) 11/19/2013  . Pneumonia   . Shortness of breath    WITH EXERTION  . Sinus drainage    PT STARTED ANTIBIOTIC 11/17/11 --IS HOARSE TODAY, SOME WHEEZING AND COUGH WITH YELLOW DRAINAGE  . SIRS (systemic inflammatory response syndrome) ([place]) 09/04/2013  . SOB (shortness of breath) 09/04/2013  . Stroke ([place]) 02/2007, 08/2009   2 total -RESIDUAL WEAKNESS LEFT LEG--AND APHASIA  . Stroke ([place]) 09/01/2017  . Stroke ([place]) 09/08/2017  . Tumor of ovary   . Urinary incontinence   . Urinary incontinence   . Weakness of left leg    Mildly, post stroke  Past Surgical History:  Procedure Laterality Date  . ABDOMINAL HYSTERECTOMY    . CHOLECYSTECTOMY    . LAPAROTOMY  11/22/2011   Procedure: EXPLORATORY LAPAROTOMY;  Surgeon: Imagene Gurney A. Alycia Rossetti, MD;  Location: [facility];  Service: Gynecology;  Laterality: N/A;  . MENISCUS REPAIR    . SALPINGOOPHORECTOMY  11/22/2011   Procedure: SALPINGO OOPHERECTOMY;  Surgeon: Imagene Gurney A. Alycia Rossetti, MD;  Location: [facility];  Service: Gynecology;  Laterality: Bilateral;  . TOE SURGERY    . TONSILLECTOMY    . TUBAL LIGATION      Current Medications: Current  Meds  Medication Sig  . furosemide (LASIX) 40 MG tablet Take 40 mg by mouth.  . pantoprazole (PROTONIX) 40 MG tablet Take 40 mg by mouth daily.   . potassium chloride SA (K-DUR,KLOR-CON) 20 MEQ tablet Take by mouth.     Allergies:   Amoxicillin-pot clavulanate   Social History   Socioeconomic History  . Marital status: Married    Spouse name: Not on file  . Number of children: 2  . Years of education: 31  . Highest education level: Not on file  Occupational History  . Occupation: retired  Scientific laboratory technician  . Financial resource strain: Not on file  . Food insecurity:    Worry: Not on file    Inability: Not on file  . Transportation needs:    Medical: Not on file    Non-medical: Not on file  Tobacco Use  . Smoking status: Former Smoker    Types: Cigarettes    Last attempt to quit: 11/15/1959    Years since quitting: 58.6  . Smokeless tobacco: Never Used  Substance and Sexual Activity  . Alcohol use: Yes    Alcohol/week: 0.0 oz    Comment: very rarely  . Drug use: No  . Sexual activity: Never  Lifestyle  . Physical activity:    Days per week: Not on file    Minutes per session: Not on file  . Stress: Not on file  Relationships  . Social connections:    Talks on phone: Not on file    Gets together: Not on file    Attends religious service: Not on file    Active member of club or organization: Not on file    Attends meetings of clubs or organizations: Not on file    Relationship status: Not on file  Other Topics Concern  . Not on file  Social History Narrative   Patient does not drink caffeine.   Patient is right handed.      Family History: The patient's family history includes Heart attack in her father; Heart failure in her father; Kidney failure in her mother; Stomach cancer in her maternal grandmother and other; Stroke in her paternal grandmother. ROS:   Please see the history of present illness.    All other systems reviewed and are  negative.  EKGs/Labs/Other Studies Reviewed:    The following studies were reviewed today:  EKG:  EKG ordered today.  The ekg ordered today demonstrates Sinus rhythm APC's  30 day event monitor 09/19/17 for stroke is unremarkable, no episodes of AF Recent Labs: No results found for requested labs within last 8760 hours.  Recent Lipid Panel No results found for: CHOL, TRIG, HDL, CHOLHDL, VLDL, LDLCALC, LDLDIRECT  Physical Exam:    VS:  There were no vitals taken for this visit.    Wt Readings from Last 3 Encounters:  10/17/17 163 lb (73.9 kg)  09/11/17 159 lb (72.1 kg)  04/07/16 187 lb (84.8 kg)     GEN:  Well nourished, well developed in no acute distress HEENT: Normal NECK: No JVD; No carotid bruits LYMPHATICS: No lymphadenopathy CARDIAC: RRR, no murmurs, rubs, gallops RESPIRATORY:  Clear to auscultation without rales, wheezing or rhonchi  ABDOMEN: Soft, non-tender, non-distended MUSCULOSKELETAL:  No edema; No deformity  SKIN: Warm and dry NEUROLOGIC:  Alert and oriented x 3 PSYCHIATRIC:  Normal affect    Signed, Shirlee More, MD  06/14/2018 1:41 PM    [facility]

## 2018-08-14 ENCOUNTER — Telehealth: Payer: Self-pay | Admitting: Cardiology

## 2018-08-14 NOTE — Telephone Encounter (Signed)
[facility] RN called and patient had had a 5 pd weight gain in the last 18 days. She is doing ok per RN,she feels good and no SOB/Swelling and compliant with medsIf these abnormal clinical findings persist, appropriate workup will be completed. The patient understands that follow up is required to elucidate the situation. Please reach out to patient if necessary.  She will fax over report to [place].

## 2018-08-14 NOTE — Telephone Encounter (Signed)
Take her diuretic twice daily until weight is back to her baseline

## 2018-08-14 NOTE — Telephone Encounter (Signed)
Left messages for patient and [facility] rn to return call

## 2018-08-15 NOTE — Telephone Encounter (Signed)
Left second message for patient to return call.

## 2018-08-15 NOTE — Telephone Encounter (Signed)
Left message for [facility] RN to return call

## 2018-08-15 NOTE — Telephone Encounter (Signed)
Patient informed to take furosemide 1 tablet (40 mg) twice daily versus daily until her weight is back to baseline. Patient verbalized understanding. No further questions.

## 2019-01-14 NOTE — Progress Notes (Signed)
Cardiology Office Note:    Date:  01/15/2019   ID:  April Le, DOB 1941-03-13, MRN [id number]  PCP:  Ronita Hipps, MD  Cardiologist:  Shirlee More, MD    Referring MD: Ronita Hipps, MD    ASSESSMENT:   1.  Chronic diastolic heart failure 2.  Hypertensive heart disease with heart failure 3.  Hyperlipidemia PLAN:    In order of problems listed above:  1. Heart failure is mildly decompensated she will increase the dose of her diuretic strongly encouraged restricting sodium 2. Hypertension stable continue treatment including diuretic beta-blocker calcium channel blocker 3. Dyslipidemia stable LDL is at target after stroke continue her statin   Next appointment: 6 months   Medication Adjustments/Labs and Tests Ordered: Current medicines are reviewed at length with the patient today.  Concerns regarding medicines are outlined above.  No orders of the defined types were placed in this encounter.  No orders of the defined types were placed in this encounter.   Chief Complaint  Patient presents with  . Follow-up  . Congestive Heart Failure  . Hypertension  . Chronic Kidney Disease  . Hyperlipidemia    History of Present Illness:    April Le is a 78 y.o. female with a hx of heart failure, hypertension stage 3 CKD stroke and hyyperlipidemia   last seen 06/14/18. Compliance with diet, lifestyle and medications: yes  Unfortunately she eats outside of the home and she is more edematous we will increase her furosemide to 40 mg twice daily she has had a respiratory infection antibiotics and a cough but no fever shortness of breath chest pain palpitation or syncope.  Labs performed with her PCP in December 2019 show cholesterol 172 LDL 63 HDL 50 creatinine 1.3.  She had a chest x-ray performed last week. Past Medical History:  Diagnosis Date  . Abdominal distention    past two months  . Abnormality of gait 10/28/2015  . 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 Aphasia due  to stroke   . Arthritis    right knee  . Benign essential HTN 11/19/2013  . CAP (community acquired pneumonia) 09/07/2013  . CHF (congestive heart failure) ([place])   . Chronic diastolic heart failure ([place]) 11/19/2013  . Chronic kidney disease    STAGE III KIDNEY DISEASE-PER PT--PT SEES DR. Risa Grill UROLOGIST  . Complication of anesthesia    PT REMEMBERS BREATHING PROBLEMS WAKING UP FROM KNEE SURGERY AT [facility] 2011 OR 2012  . Coronary artery calcification seen on CT scan 01/10/2017  . Fever of unknown origin 09/04/2013  . GERD (gastroesophageal reflux disease)   . Headache   . Headache(784.0)   . 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 Hypercholesteremia   . Hypertension   . Hypertensive heart disease with heart failure ([place]) 01/10/2017  . Hypothyroidism   . Hypoxia 09/04/2013  . Leukocytosis 09/04/2013  . Mass of ovary 10/26/2011  . OSA (obstructive sleep apnea) 11/19/2013  . Pneumonia   . Shortness of breath    WITH EXERTION  . Sinus drainage    PT STARTED ANTIBIOTIC 11/17/11 --IS HOARSE TODAY, SOME WHEEZING AND COUGH WITH YELLOW DRAINAGE  . SIRS (systemic inflammatory response syndrome) ([place]) 09/04/2013  . SOB (shortness of breath) 09/04/2013  . Stroke ([place]) 02/2007, 08/2009   2 total -RESIDUAL WEAKNESS LEFT LEG--AND APHASIA  . Stroke ([place]) 09/01/2017  . Stroke ([place]) 09/08/2017  . Tumor of ovary   .  Urinary incontinence   . Urinary incontinence   . Weakness of left leg    Mildly, post stroke    Past Surgical History:  Procedure Laterality Date  . ABDOMINAL HYSTERECTOMY    . CHOLECYSTECTOMY    . LAPAROTOMY  11/22/2011   Procedure: EXPLORATORY LAPAROTOMY;  Surgeon: Imagene Gurney A. Alycia Rossetti, MD;  Location: [facility];  Service: Gynecology;  Laterality: N/A;  . MENISCUS REPAIR    . SALPINGOOPHORECTOMY  11/22/2011   Procedure: SALPINGO OOPHERECTOMY;  Surgeon: Imagene Gurney A. Alycia Rossetti, MD;  Location: [facility];  Service: Gynecology;   Laterality: Bilateral;  . TOE SURGERY    . TONSILLECTOMY    . TUBAL LIGATION      Current Medications: Current Meds  Medication Sig  . amLODipine (NORVASC) 10 MG tablet Take 10 mg by mouth daily with breakfast.   . aspirin 81 MG tablet Take 81 mg by mouth daily with breakfast.   . atorvastatin (LIPITOR) 40 MG tablet Take 40 mg by mouth at bedtime.   . clopidogrel (PLAVIX) 75 MG tablet Take 75 mg by mouth daily.   . febuxostat (ULORIC) 40 MG tablet Take 40 mg by mouth daily.   . furosemide (LASIX) 40 MG tablet Take 40 mg by mouth as directed. 1 tablet in the morning and 0.5 tablet at 2 pm  . levofloxacin (LEVAQUIN) 500 MG tablet TAKE 1 TABLET BY MOUTH ONCE DAILY FOR 10 DAYS.  Marland Kitchen levothyroxine (SYNTHROID, LEVOTHROID) 50 MCG tablet Take 50 mcg by mouth daily at 6 (six) AM.   . metoprolol (LOPRESSOR) 50 MG tablet Take 50 mg by mouth 2 (two) times daily. One at 9 am and at 8pm  . pantoprazole (PROTONIX) 40 MG tablet Take 40 mg by mouth daily.   . potassium chloride SA (K-DUR,KLOR-CON) 20 MEQ tablet Take 20 mEq by mouth daily.      Allergies:   Amoxicillin-pot clavulanate   Social History   Socioeconomic History  . Marital status: Married    Spouse name: Not on file  . Number of children: 2  . Years of education: 59  . Highest education level: Not on file  Occupational History  . Occupation: retired  Scientific laboratory technician  . Financial resource strain: Not on file  . Food insecurity:    Worry: Not on file    Inability: Not on file  . Transportation needs:    Medical: Not on file    Non-medical: Not on file  Tobacco Use  . Smoking status: Former Smoker    Types: Cigarettes    Last attempt to quit: 11/15/1959    Years since quitting: 59.2  . Smokeless tobacco: Never Used  Substance and Sexual Activity  . Alcohol use: Yes    Alcohol/week: 0.0 standard drinks    Comment: very rarely  . Drug use: No  . Sexual activity: Never  Lifestyle  . Physical activity:    Days per week: Not on  file    Minutes per session: Not on file  . Stress: Not on file  Relationships  . Social connections:    Talks on phone: Not on file    Gets together: Not on file    Attends religious service: Not on file    Active member of club or organization: Not on file    Attends meetings of clubs or organizations: Not on file    Relationship status: Not on file  Other Topics Concern  . Not on file  Social History Narrative   Patient does not  drink caffeine.   Patient is right handed.      Family History: The patient's family history includes Heart attack in her father; Heart failure in her father; Kidney failure in her mother; Stomach cancer in her maternal grandmother and another family member; Stroke in her paternal grandmother. ROS:   Please see the history of present illness.    All other systems reviewed and are negative.  EKGs/Labs/Other Studies Reviewed:    The following studies were reviewed today:  EKG:  EKG ordered today and personally reviewed.  The ekg ordered today demonstrates [facility] normal EKG  Recent Labs: No results found for requested labs within last 8760 hours.  Recent Lipid Panel No results found for: CHOL, TRIG, HDL, CHOLHDL, VLDL, LDLCALC, LDLDIRECT  Physical Exam:    VS:  BP 124/66 (BP Location: Right Arm, Patient Position: Sitting, Cuff Size: Large)   Pulse 61   Ht 5' (1.524 m)   Wt 177 lb (80.3 kg)   SpO2 94%   BMI 34.57 kg/m     Wt Readings from Last 3 Encounters:  01/15/19 177 lb (80.3 kg)  10/17/17 163 lb (73.9 kg)  09/11/17 159 lb (72.1 kg)     GEN:  Well nourished, well developed in no acute distress HEENT: Normal NECK: No JVD; No carotid bruits LYMPHATICS: No lymphadenopathy CARDIAC: RRR, no murmurs, rubs, gallops RESPIRATORY:  Clear to auscultation without rales, wheezing or rhonchi  ABDOMEN: Soft, non-tender, non-distended MUSCULOSKELETAL:  No edema; No deformity  SKIN: Warm and dry NEUROLOGIC:  Alert and oriented x 3 PSYCHIATRIC:   Normal affect    Signed, Shirlee More, MD  01/15/2019 4:29 PM    [facility]

## 2019-01-15 ENCOUNTER — Ambulatory Visit (INDEPENDENT_AMBULATORY_CARE_PROVIDER_SITE_OTHER): Payer: Medicare Other | Admitting: Cardiology

## 2019-01-15 ENCOUNTER — Encounter: Payer: Self-pay | Admitting: Cardiology

## 2019-01-15 VITALS — BP 124/66 | HR 61 | Ht 60.0 in | Wt 177.0 lb

## 2019-01-15 DIAGNOSIS — I5032 Chronic diastolic (congestive) heart failure: Secondary | ICD-10-CM | POA: Diagnosis not present

## 2019-01-15 DIAGNOSIS — I110 Hypertensive heart disease with heart failure: Secondary | ICD-10-CM

## 2019-01-15 DIAGNOSIS — E7800 Pure hypercholesterolemia, unspecified: Secondary | ICD-10-CM

## 2019-01-15 MED ORDER — FUROSEMIDE 40 MG PO TABS: 40.0000 mg | ORAL_TABLET | Freq: Two times a day (BID) | ORAL | 1 refills | Status: DC

## 2019-01-15 NOTE — Patient Instructions (Signed)
Medication Instructions:  Your physician has recommended you make the following change in your medication:   INCREASE furosemide (lasix) 40 mg: Take 1 tablet twice daily  If you need a refill on your cardiac medications before your next appointment, please call your pharmacy.   Lab work: None  If you have labs (blood work) drawn today and your tests are completely normal, you will receive your results only by: Marland Kitchen MyChart Message (if you have MyChart) OR . A paper copy in the mail If you have any lab test that is abnormal or we need to change your treatment, we will call you to review the results.  Testing/Procedures: None  Follow-Up: At [facility], you and your health needs are our priority.  As part of our continuing mission to provide you with exceptional heart care, we have created designated Provider Care Teams.  These Care Teams include your primary Cardiologist (physician) and Advanced Practice Providers (APPs -  Physician Assistants and Nurse Practitioners) who all work together to provide you with the care you need, when you need it. You will need a follow up appointment in 6 months.

## 2019-07-17 NOTE — Progress Notes (Signed)
Cardiology Office Note:    Date:  07/18/2019   ID:  April Le, DOB [birth year], MRN [id number]  PCP:  Ronita Hipps, MD  Cardiologist:  Shirlee More, MD    Referring MD: Ronita Hipps, MD    ASSESSMENT:    1. Chronic diastolic heart failure ([place])   2. Hypertensive heart disease with heart failure ([place])   3. CKD (chronic kidney disease), stage III ([place])   4. Hypercholesteremia   5. Bilateral lower extremity edema    PLAN:    In order of problems listed above:  1. Compensated continue her current diuretic will check proBNP today but I think her edema is due to calcium channel blocker and will limit 2. See discussion below withdraw calcium channel blocker and institute hydralazine 3. Recheck BMP 4. Lipids are ideal continue statin 5. Due to calcium channel blocker   Next appointment: 3 months   Medication Adjustments/Labs and Tests Ordered: Current medicines are reviewed at length with the patient today.  Concerns regarding medicines are outlined above.  No orders of the defined types were placed in this encounter.  No orders of the defined types were placed in this encounter.   No chief complaint on file.   History of Present Illness:    April Le is a 78 y.o. female with a hx of  heart failure, hypertension stage 3 CKD stroke and hyyperlipidemia  last seen 01/15/2019. Compliance with diet, lifestyle and medications: Yes  Bilateral lower extremity dependent edema due to calcium channel blocker BP is at target will withdraw her calcium channel blocker check blood pressure daily once consistently greater than [id number] systolic institute hydralazine 12 and half milligrams twice daily.  Her edema is not due to heart failure she is not short of breath no chest pain palpitation or syncope she is at risk for atrial fibrillation I purchased from adapter and will begin to check her blood pressure 3 times a week to screen for atrial fibrillation. Past Medical History:   Diagnosis Date  . Abdominal distention    past two months  . Abnormality of gait 10/28/2015  . 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 Arthritis    right knee  . Benign essential HTN 11/19/2013  . CAP (community acquired pneumonia) 09/07/2013  . CHF (congestive heart failure) ([place])   . Chronic diastolic heart failure ([place]) 11/19/2013  . Chronic kidney disease    STAGE III KIDNEY DISEASE-PER PT--PT SEES DR. Risa Grill UROLOGIST  . Complication of anesthesia    PT REMEMBERS BREATHING PROBLEMS WAKING UP FROM KNEE SURGERY AT [facility] [address] 2011 OR 2012  . Coronary artery calcification seen on CT scan 01/10/2017  . Fever of unknown origin 09/04/2013  . GERD (gastroesophageal reflux disease)   . Headache   . Headache(784.0)   . 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 Hypercholesteremia   . Hypertension   . Hypertensive heart disease with heart failure ([place]) 01/10/2017  . Hypothyroidism   . Hypoxia 09/04/2013  . Leukocytosis 09/04/2013  . Mass of ovary 10/26/2011  . OSA (obstructive sleep apnea) 11/19/2013  . Pneumonia   . Shortness of breath    WITH EXERTION  . Sinus drainage    PT STARTED ANTIBIOTIC 11/17/11 --IS HOARSE TODAY, SOME WHEEZING AND COUGH WITH YELLOW DRAINAGE  . SIRS (systemic inflammatory response syndrome) ([place]) 09/04/2013  . SOB (shortness of breath) 09/04/2013  . Stroke (  [place]) 02/2007, 08/2009   2 total -RESIDUAL WEAKNESS LEFT LEG--AND APHASIA  . Stroke ([place]) 09/01/2017  . Stroke ([place]) 09/08/2017  . Tumor of ovary   . Urinary incontinence   . Urinary incontinence   . Weakness of left leg    Mildly, post stroke    Past Surgical History:  Procedure Laterality Date  . ABDOMINAL HYSTERECTOMY    . CHOLECYSTECTOMY    . LAPAROTOMY  11/22/2011   Procedure: EXPLORATORY LAPAROTOMY;  Surgeon: Imagene Gurney A. Alycia Rossetti, MD;  Location: [facility];  Service: Gynecology;  Laterality: N/A;  . MENISCUS REPAIR    .  SALPINGOOPHORECTOMY  11/22/2011   Procedure: SALPINGO OOPHERECTOMY;  Surgeon: Imagene Gurney A. Alycia Rossetti, MD;  Location: [facility];  Service: Gynecology;  Laterality: Bilateral;  . TOE SURGERY    . TONSILLECTOMY    . TUBAL LIGATION      Current Medications: Current Meds  Medication Sig  . amLODipine (NORVASC) 10 MG tablet Take 10 mg by mouth daily with breakfast.   . aspirin 81 MG tablet Take 81 mg by mouth daily with breakfast.   . atorvastatin (LIPITOR) 40 MG tablet Take 40 mg by mouth at bedtime.   . clopidogrel (PLAVIX) 75 MG tablet Take 75 mg by mouth daily.   Marland Kitchen donepezil (ARICEPT) 5 MG tablet Take 5 mg by mouth daily.  . febuxostat (ULORIC) 40 MG tablet Take 40 mg by mouth daily.   . furosemide (LASIX) 40 MG tablet Take 1 tablet (40 mg total) by mouth 2 (two) times daily.  Marland Kitchen levothyroxine (SYNTHROID, LEVOTHROID) 50 MCG tablet Take 50 mcg by mouth daily at 6 (six) AM.   . metoprolol (LOPRESSOR) 50 MG tablet Take 50 mg by mouth 2 (two) times daily. One at 9 am and at 8pm  . Multiple Vitamins-Minerals (MULTI-VITAMIN GUMMIES PO) Take 1 tablet by mouth daily.  . pantoprazole (PROTONIX) 40 MG tablet Take 40 mg by mouth daily.      Allergies:   Amoxicillin-pot clavulanate   Social History   Socioeconomic History  . Marital status: Married    Spouse name: Not on file  . Number of children: 2  . Years of education: 60  . Highest education level: Not on file  Occupational History  . Occupation: retired  Scientific laboratory technician  . Financial resource strain: Not on file  . Food insecurity    Worry: Not on file    Inability: Not on file  . Transportation needs    Medical: Not on file    Non-medical: Not on file  Tobacco Use  . Smoking status: Former Smoker    Types: Cigarettes    Quit date: 11/15/1959    Years since quitting: 59.7  . Smokeless tobacco: Never Used  Substance and Sexual Activity  . Alcohol use: Yes    Alcohol/week: 0.0 standard drinks    Comment: very rarely  . Drug use: No  . Sexual  activity: Never  Lifestyle  . Physical activity    Days per week: Not on file    Minutes per session: Not on file  . Stress: Not on file  Relationships  . Social Herbalist on phone: Not on file    Gets together: Not on file    Attends religious service: Not on file    Active member of club or organization: Not on file    Attends meetings of clubs or organizations: Not on file    Relationship status: Not on file  Other Topics  Concern  . Not on file  Social History Narrative   Patient does not drink caffeine.   Patient is right handed.      Family History: The patient's family history includes Heart attack in her father; Heart failure in her father; Kidney failure in her mother; Stomach cancer in her maternal grandmother and another family member; Stroke in her paternal grandmother. ROS:   Please see the history of present illness.    All other systems reviewed and are negative.  EKGs/Labs/Other Studies Reviewed:    The following studies were reviewed today:  Recent Labs: 04/15/2019 [facility] home 162 HDL 60 LDL 42 A1c 5.7 hemoglobin 14.4 creatinine 1.2 potassium 3.8.  Physical Exam:    VS:  BP 112/70 (BP Location: Right Arm, Patient Position: Sitting, Cuff Size: Large)   Pulse (!) 54   Ht 5' (1.524 m)   Wt 169 lb 1.9 oz (76.7 kg)   SpO2 93%   BMI 33.03 kg/m     Wt Readings from Last 3 Encounters:  07/18/19 169 lb 1.9 oz (76.7 kg)  01/15/19 177 lb (80.3 kg)  10/17/17 163 lb (73.9 kg)     GEN:  Well nourished, well developed in no acute distress HEENT: Normal NECK: No JVD; No carotid bruits LYMPHATICS: No lymphadenopathy CARDIAC: RRR, no murmurs, rubs, gallops RESPIRATORY:  Clear to auscultation without rales, wheezing or rhonchi  ABDOMEN: Soft, non-tender, non-distended MUSCULOSKELETAL: Marked  lower extremity edema predominantly at the ankle edema; No deformity  SKIN: Warm and dry NEUROLOGIC:  Alert and oriented x 3 PSYCHIATRIC:  Normal affect     Signed, Shirlee More, MD  07/18/2019 3:47 PM    [facility]

## 2019-07-18 ENCOUNTER — Other Ambulatory Visit: Payer: Self-pay

## 2019-07-18 ENCOUNTER — Ambulatory Visit (INDEPENDENT_AMBULATORY_CARE_PROVIDER_SITE_OTHER): Payer: Medicare Other | Admitting: Cardiology

## 2019-07-18 VITALS — BP 112/70 | HR 54 | Ht 60.0 in | Wt 169.1 lb

## 2019-07-18 DIAGNOSIS — E7800 Pure hypercholesterolemia, unspecified: Secondary | ICD-10-CM | POA: Diagnosis not present

## 2019-07-18 DIAGNOSIS — N183 Chronic kidney disease, stage 3 unspecified: Secondary | ICD-10-CM

## 2019-07-18 DIAGNOSIS — R600 Localized edema: Secondary | ICD-10-CM

## 2019-07-18 DIAGNOSIS — I110 Hypertensive heart disease with heart failure: Secondary | ICD-10-CM | POA: Diagnosis not present

## 2019-07-18 DIAGNOSIS — I5032 Chronic diastolic (congestive) heart failure: Secondary | ICD-10-CM

## 2019-07-18 MED ORDER — HYDRALAZINE HCL 25 MG PO TABS: 12.5000 mg | ORAL_TABLET | Freq: Two times a day (BID) | ORAL | 3 refills | Status: DC

## 2019-07-18 NOTE — Patient Instructions (Signed)
Medication Instructions:  Your physician has recommended you make the following change in your medication:   STOP amlodipine (norvasc)  START hydralazine (apresoline) 25 mg: Take 0.5 tablet (12.5 mg) twice daily if your systolic blood pressure (top number) is consistently greater than 140  If you need a refill on your cardiac medications before your next appointment, please call your pharmacy.   Lab work: Your physician recommends that you return for lab work today: BMP, ProBNP.   If you have labs (blood work) drawn today and your tests are completely normal, you will receive your results only by: Marland Kitchen MyChart Message (if you have MyChart) OR . A paper copy in the mail If you have any lab test that is abnormal or we need to change your treatment, we will call you to review the results.  Testing/Procedures: None  Follow-Up: At [facility], you and your health needs are our priority.  As part of our continuing mission to provide you with exceptional heart care, we have created designated Provider Care Teams.  These Care Teams include your primary Cardiologist (physician) and Advanced Practice Providers (APPs -  Physician Assistants and Nurse Practitioners) who all work together to provide you with the care you need, when you need it. . You will need a follow up appointment in 3 months.   Any Other Special Instructions Will Be Listed Below (If Applicable).  **Check your BP daily & check your heart rate with your kardia app three times a week!     Hydralazine tablets What is this medicine? HYDRALAZINE (hye DRAL a zeen) is a type of vasodilator. It relaxes blood vessels, increasing the blood and oxygen supply to your heart. This medicine is used to treat high blood pressure. This medicine may be used for other purposes; ask your health care provider or pharmacist if you have questions. COMMON BRAND NAME(S): Apresoline What should I tell my health care provider before I take this  medicine? They need to know if you have any of these conditions:  blood vessel disease  heart disease including angina or history of heart attack  kidney or liver disease  systemic lupus erythematosus (SLE)  an unusual or allergic reaction to hydralazine, tartrazine dye, other medicines, foods, dyes, or preservatives  pregnant or trying to get pregnant  breast-feeding How should I use this medicine? Take this medicine by mouth with a glass of water. Follow the directions on the prescription label. Take your doses at regular intervals. Do not take your medicine more often than directed. Do not stop taking except on the advice of your doctor or health care professional. Talk to your pediatrician regarding the use of this medicine in children. Special care may be needed. While this drug may be prescribed for children for selected conditions, precautions do apply. Overdosage: If you think you have taken too much of this medicine contact a poison control center or emergency room at once. NOTE: This medicine is only for you. Do not share this medicine with others. What if I miss a dose? If you miss a dose, take it as soon as you can. If it is almost time for your next dose, take only that dose. Do not take double or extra doses. What may interact with this medicine?  medicines for high blood pressure  medicines for mental depression This list may not describe all possible interactions. Give your health care provider a list of all the medicines, herbs, non-prescription drugs, or dietary supplements you use. Also tell them  if you smoke, drink alcohol, or use illegal drugs. Some items may interact with your medicine. What should I watch for while using this medicine? Visit your doctor or health care professional for regular checks on your progress. Check your blood pressure and pulse rate regularly. Ask your doctor or health care professional what your blood pressure and pulse rate should be and  when you should contact him or her. You may get drowsy or dizzy. Do not drive, use machinery, or do anything that needs mental alertness until you know how this medicine affects you. Do not stand or sit up quickly, especially if you are an older patient. This reduces the risk of dizzy or fainting spells. Alcohol may interfere with the effect of this medicine. Avoid alcoholic drinks. Do not treat yourself for coughs, colds, or pain while you are taking this medicine without asking your doctor or health care professional for advice. Some ingredients may increase your blood pressure. What side effects may I notice from receiving this medicine? Side effects that you should report to your doctor or health care professional as soon as possible:  chest pain, or fast or irregular heartbeat  fever, chills, or sore throat  numbness or tingling in the hands or feet  shortness of breath  skin rash, redness, blisters or itching  stiff or swollen joints  sudden weight gain  swelling of the feet or legs  swollen lymph glands  unusual weakness Side effects that usually do not require medical attention (report to your doctor or health care professional if they continue or are bothersome):  diarrhea, or constipation  headache  loss of appetite  nausea, vomiting This list may not describe all possible side effects. Call your doctor for medical advice about side effects. You may report side effects to FDA at 1-800-FDA-1088. Where should I keep my medicine? Keep out of the reach of children. Store at room temperature between 15 and 30 degrees C (59 and 86 degrees F). Throw away any unused medicine after the expiration date. NOTE: This sheet is a summary. It may not cover all possible information. If you have questions about this medicine, talk to your doctor, pharmacist, or health care provider.  2020 Elsevier/Gold Standard (2008-03-14 15:44:58)

## 2019-07-19 LAB — BASIC METABOLIC PANEL
BUN/Creatinine Ratio: 20 (ref 12–28)
BUN: 25 mg/dL (ref 8–27)
CO2: 20 mmol/L (ref 20–29)
Calcium: 9.5 mg/dL (ref 8.7–10.3)
Chloride: 106 mmol/L (ref 96–106)
Creatinine, Ser: 1.26 mg/dL — ABNORMAL HIGH (ref 0.57–1.00)
GFR calc Af Amer: 47 mL/min/{1.73_m2} — ABNORMAL LOW (ref >59)
GFR calc non Af Amer: 41 mL/min/{1.73_m2} — ABNORMAL LOW (ref >59)
Glucose: 87 mg/dL (ref 65–99)
Potassium: 4.5 mmol/L (ref 3.5–5.2)
Sodium: 144 mmol/L (ref 134–144)

## 2019-07-19 LAB — PRO B NATRIURETIC PEPTIDE: NT-Pro BNP: 760 pg/mL — ABNORMAL HIGH (ref 0–738)

## 2019-08-21 ENCOUNTER — Telehealth: Payer: Self-pay | Admitting: Cardiology

## 2019-08-21 NOTE — Telephone Encounter (Signed)
Has questions about her BP readings since being started on a new medication(does not know the name of it)

## 2019-08-22 MED ORDER — HYDRALAZINE HCL 25 MG PO TABS: ORAL_TABLET | ORAL | 3 refills | Status: DC

## 2019-08-22 NOTE — Telephone Encounter (Signed)
Spoke with patient's daughter, [name], per DPR who is concerned about patient's BP readings progressively increasing over the past 3 weeks. She reports patient's systolic BP has been in the 180's 4 days in the past several weeks. Patient's BP was 183/80 on Tuesday and 163/86 yesterday. [name] does not have the rest of the BP readings as she is at work. Patient has fallen twice in the past 2 weeks and has been on antibiotics for a sinus infection twice since her last office visit on 07/18/2019. She is taking all medications as prescribed. Please advise. Thanks!

## 2019-08-22 NOTE — Telephone Encounter (Signed)
This is complicated and I would would advise seeing PCP today or tomorrow

## 2019-08-22 NOTE — Addendum Note (Signed)
Addended by: Austin Miles on: 08/22/2019 10:08 AM   Modules accepted: Orders

## 2019-08-22 NOTE — Telephone Encounter (Signed)
[name] informed to contact patient's PCP to schedule an appointment for further evaluation. [name] is agreeable to plan and verbalized understanding. No further questions.

## 2019-09-11 ENCOUNTER — Emergency Department (HOSPITAL_COMMUNITY): Payer: Medicare Other

## 2019-09-11 ENCOUNTER — Encounter (HOSPITAL_COMMUNITY): Payer: Self-pay | Admitting: Radiology

## 2019-09-11 ENCOUNTER — Other Ambulatory Visit (HOSPITAL_COMMUNITY): Payer: Medicare Other

## 2019-09-11 ENCOUNTER — Inpatient Hospital Stay (HOSPITAL_COMMUNITY)
Admission: EM | Admit: 2019-09-11 | Discharge: 2019-09-17 | DRG: 024 | Disposition: A | Discharge status: Rehabilitation Facility | Payer: Medicare Other | Attending: Neurology | Admitting: Neurology

## 2019-09-11 ENCOUNTER — Emergency Department (HOSPITAL_COMMUNITY): Payer: Medicare Other | Admitting: Anesthesiology

## 2019-09-11 ENCOUNTER — Encounter (HOSPITAL_COMMUNITY)
Admission: EM | Disposition: A | Discharge status: Rehabilitation Facility | Payer: Self-pay | Source: Home / Self Care | Attending: Neurology

## 2019-09-11 DIAGNOSIS — I6602 Occlusion and stenosis of left middle cerebral artery: Secondary | ICD-10-CM | POA: Diagnosis present

## 2019-09-11 DIAGNOSIS — Z20828 Contact with and (suspected) exposure to other viral communicable diseases: Secondary | ICD-10-CM | POA: Diagnosis present

## 2019-09-11 DIAGNOSIS — K219 Gastro-esophageal reflux disease without esophagitis: Secondary | ICD-10-CM | POA: Diagnosis present

## 2019-09-11 DIAGNOSIS — I69354 Hemiplegia and hemiparesis following cerebral infarction affecting left non-dominant side: Secondary | ICD-10-CM | POA: Diagnosis not present

## 2019-09-11 DIAGNOSIS — G9349 Other encephalopathy: Secondary | ICD-10-CM | POA: Diagnosis present

## 2019-09-11 DIAGNOSIS — R2689 Other abnormalities of gait and mobility: Secondary | ICD-10-CM | POA: Diagnosis not present

## 2019-09-11 DIAGNOSIS — I69351 Hemiplegia and hemiparesis following cerebral infarction affecting right dominant side: Secondary | ICD-10-CM | POA: Diagnosis not present

## 2019-09-11 DIAGNOSIS — Z4659 Encounter for fitting and adjustment of other gastrointestinal appliance and device: Secondary | ICD-10-CM | POA: Diagnosis not present

## 2019-09-11 DIAGNOSIS — D72829 Elevated white blood cell count, unspecified: Secondary | ICD-10-CM | POA: Diagnosis present

## 2019-09-11 DIAGNOSIS — E46 Unspecified protein-calorie malnutrition: Secondary | ICD-10-CM | POA: Diagnosis present

## 2019-09-11 DIAGNOSIS — I130 Hypertensive heart and chronic kidney disease with heart failure and stage 1 through stage 4 chronic kidney disease, or unspecified chronic kidney disease: Secondary | ICD-10-CM | POA: Diagnosis present

## 2019-09-11 DIAGNOSIS — I2510 Atherosclerotic heart disease of native coronary artery without angina pectoris: Secondary | ICD-10-CM | POA: Diagnosis present

## 2019-09-11 DIAGNOSIS — D696 Thrombocytopenia, unspecified: Secondary | ICD-10-CM | POA: Diagnosis not present

## 2019-09-11 DIAGNOSIS — Z781 Physical restraint status: Secondary | ICD-10-CM

## 2019-09-11 DIAGNOSIS — I10 Essential (primary) hypertension: Secondary | ICD-10-CM | POA: Diagnosis not present

## 2019-09-11 DIAGNOSIS — E039 Hypothyroidism, unspecified: Secondary | ICD-10-CM | POA: Diagnosis present

## 2019-09-11 DIAGNOSIS — I5032 Chronic diastolic (congestive) heart failure: Secondary | ICD-10-CM | POA: Diagnosis present

## 2019-09-11 DIAGNOSIS — I639 Cerebral infarction, unspecified: Secondary | ICD-10-CM | POA: Diagnosis present

## 2019-09-11 DIAGNOSIS — Z823 Family history of stroke: Secondary | ICD-10-CM

## 2019-09-11 DIAGNOSIS — I63512 Cerebral infarction due to unspecified occlusion or stenosis of left middle cerebral artery: Secondary | ICD-10-CM | POA: Diagnosis not present

## 2019-09-11 DIAGNOSIS — D62 Acute posthemorrhagic anemia: Secondary | ICD-10-CM | POA: Diagnosis not present

## 2019-09-11 DIAGNOSIS — I472 Ventricular tachycardia: Secondary | ICD-10-CM | POA: Diagnosis not present

## 2019-09-11 DIAGNOSIS — F05 Delirium due to known physiological condition: Secondary | ICD-10-CM | POA: Diagnosis present

## 2019-09-11 DIAGNOSIS — Z87891 Personal history of nicotine dependence: Secondary | ICD-10-CM | POA: Diagnosis not present

## 2019-09-11 DIAGNOSIS — R001 Bradycardia, unspecified: Secondary | ICD-10-CM | POA: Diagnosis not present

## 2019-09-11 DIAGNOSIS — R0602 Shortness of breath: Secondary | ICD-10-CM | POA: Diagnosis not present

## 2019-09-11 DIAGNOSIS — R29810 Facial weakness: Secondary | ICD-10-CM | POA: Diagnosis present

## 2019-09-11 DIAGNOSIS — I69320 Aphasia following cerebral infarction: Secondary | ICD-10-CM | POA: Diagnosis not present

## 2019-09-11 DIAGNOSIS — R1310 Dysphagia, unspecified: Secondary | ICD-10-CM

## 2019-09-11 DIAGNOSIS — E7800 Pure hypercholesterolemia, unspecified: Secondary | ICD-10-CM | POA: Diagnosis present

## 2019-09-11 DIAGNOSIS — I5031 Acute diastolic (congestive) heart failure: Secondary | ICD-10-CM | POA: Diagnosis not present

## 2019-09-11 DIAGNOSIS — Z7902 Long term (current) use of antithrombotics/antiplatelets: Secondary | ICD-10-CM | POA: Diagnosis not present

## 2019-09-11 DIAGNOSIS — I69391 Dysphagia following cerebral infarction: Secondary | ICD-10-CM

## 2019-09-11 DIAGNOSIS — J8410 Pulmonary fibrosis, unspecified: Secondary | ICD-10-CM | POA: Diagnosis not present

## 2019-09-11 DIAGNOSIS — Z8673 Personal history of transient ischemic attack (TIA), and cerebral infarction without residual deficits: Secondary | ICD-10-CM | POA: Diagnosis not present

## 2019-09-11 DIAGNOSIS — Z79890 Hormone replacement therapy (postmenopausal): Secondary | ICD-10-CM | POA: Diagnosis not present

## 2019-09-11 DIAGNOSIS — R29722 NIHSS score 22: Secondary | ICD-10-CM | POA: Diagnosis present

## 2019-09-11 DIAGNOSIS — I6389 Other cerebral infarction: Secondary | ICD-10-CM | POA: Diagnosis not present

## 2019-09-11 DIAGNOSIS — I69398 Other sequelae of cerebral infarction: Secondary | ICD-10-CM | POA: Diagnosis not present

## 2019-09-11 DIAGNOSIS — I69390 Apraxia following cerebral infarction: Secondary | ICD-10-CM | POA: Diagnosis not present

## 2019-09-11 DIAGNOSIS — G8191 Hemiplegia, unspecified affecting right dominant side: Secondary | ICD-10-CM | POA: Diagnosis present

## 2019-09-11 DIAGNOSIS — Z90710 Acquired absence of both cervix and uterus: Secondary | ICD-10-CM | POA: Diagnosis not present

## 2019-09-11 DIAGNOSIS — E785 Hyperlipidemia, unspecified: Secondary | ICD-10-CM | POA: Diagnosis present

## 2019-09-11 DIAGNOSIS — Z79899 Other long term (current) drug therapy: Secondary | ICD-10-CM | POA: Diagnosis not present

## 2019-09-11 DIAGNOSIS — Z6833 Body mass index (BMI) 33.0-33.9, adult: Secondary | ICD-10-CM

## 2019-09-11 DIAGNOSIS — R569 Unspecified convulsions: Secondary | ICD-10-CM | POA: Diagnosis not present

## 2019-09-11 DIAGNOSIS — R4701 Aphasia: Secondary | ICD-10-CM | POA: Diagnosis not present

## 2019-09-11 DIAGNOSIS — H53461 Homonymous bilateral field defects, right side: Secondary | ICD-10-CM | POA: Diagnosis present

## 2019-09-11 DIAGNOSIS — G9340 Encephalopathy, unspecified: Secondary | ICD-10-CM | POA: Diagnosis not present

## 2019-09-11 DIAGNOSIS — N179 Acute kidney failure, unspecified: Secondary | ICD-10-CM | POA: Diagnosis not present

## 2019-09-11 DIAGNOSIS — N183 Chronic kidney disease, stage 3 unspecified: Secondary | ICD-10-CM | POA: Diagnosis present

## 2019-09-11 DIAGNOSIS — Z7982 Long term (current) use of aspirin: Secondary | ICD-10-CM | POA: Diagnosis not present

## 2019-09-11 DIAGNOSIS — I509 Heart failure, unspecified: Secondary | ICD-10-CM | POA: Diagnosis not present

## 2019-09-11 DIAGNOSIS — I739 Peripheral vascular disease, unspecified: Secondary | ICD-10-CM | POA: Diagnosis present

## 2019-09-11 DIAGNOSIS — R1312 Dysphagia, oropharyngeal phase: Secondary | ICD-10-CM | POA: Diagnosis not present

## 2019-09-11 DIAGNOSIS — T801XXA Vascular complications following infusion, transfusion and therapeutic injection, initial encounter: Secondary | ICD-10-CM | POA: Diagnosis not present

## 2019-09-11 HISTORY — PX: IR CT HEAD LTD: IMG2386

## 2019-09-11 HISTORY — PX: RADIOLOGY WITH ANESTHESIA: SHX6223

## 2019-09-11 HISTORY — PX: IR PERCUTANEOUS ART THROMBECTOMY/INFUSION INTRACRANIAL INC DIAG ANGIO: IMG6087

## 2019-09-11 LAB — I-STAT CHEM 8, ED
BUN: 35 mg/dL — ABNORMAL HIGH (ref 8–23)
Calcium, Ion: 1.2 mmol/L (ref 1.15–1.40)
Chloride: 108 mmol/L (ref 98–111)
Creatinine, Ser: 1.2 mg/dL — ABNORMAL HIGH (ref 0.44–1.00)
Glucose, Bld: 147 mg/dL — ABNORMAL HIGH (ref 70–99)
HCT: 38 % (ref 36.0–46.0)
Hemoglobin: 12.9 g/dL (ref 12.0–15.0)
Potassium: 4.1 mmol/L (ref 3.5–5.1)
Sodium: 141 mmol/L (ref 135–145)
TCO2: 22 mmol/L (ref 22–32)

## 2019-09-11 LAB — COMPREHENSIVE METABOLIC PANEL
ALT: 16 U/L (ref 0–44)
AST: 22 U/L (ref 15–41)
Albumin: 3 g/dL — ABNORMAL LOW (ref 3.5–5.0)
Alkaline Phosphatase: 77 U/L (ref 38–126)
Anion gap: 11 (ref 5–15)
BUN: 30 mg/dL — ABNORMAL HIGH (ref 8–23)
CO2: 23 mmol/L (ref 22–32)
Calcium: 8.8 mg/dL — ABNORMAL LOW (ref 8.9–10.3)
Chloride: 105 mmol/L (ref 98–111)
Creatinine, Ser: 1.25 mg/dL — ABNORMAL HIGH (ref 0.44–1.00)
GFR calc Af Amer: 47 mL/min — ABNORMAL LOW (ref >60)
GFR calc non Af Amer: 41 mL/min — ABNORMAL LOW (ref >60)
Glucose, Bld: 158 mg/dL — ABNORMAL HIGH (ref 70–99)
Potassium: 3.9 mmol/L (ref 3.5–5.1)
Sodium: 139 mmol/L (ref 135–145)
Total Bilirubin: 0.8 mg/dL (ref 0.3–1.2)
Total Protein: 5.7 g/dL — ABNORMAL LOW (ref 6.5–8.1)

## 2019-09-11 LAB — SARS CORONAVIRUS 2 BY RT PCR (HOSPITAL ORDER, PERFORMED IN ~~LOC~~ HOSPITAL LAB): SARS Coronavirus 2: NEGATIVE

## 2019-09-11 LAB — CBC WITH DIFFERENTIAL/PLATELET
Abs Immature Granulocytes: 0.07 10*3/uL (ref 0.00–0.07)
Basophils Absolute: 0.1 10*3/uL (ref 0.0–0.1)
Basophils Relative: 1 %
Eosinophils Absolute: 0.1 10*3/uL (ref 0.0–0.5)
Eosinophils Relative: 1 %
HCT: 35.8 % — ABNORMAL LOW (ref 36.0–46.0)
Hemoglobin: 11.8 g/dL — ABNORMAL LOW (ref 12.0–15.0)
Immature Granulocytes: 1 %
Lymphocytes Relative: 7 %
Lymphs Abs: 0.7 10*3/uL (ref 0.7–4.0)
MCH: 30.6 pg (ref 26.0–34.0)
MCHC: 33 g/dL (ref 30.0–36.0)
MCV: 93 fL (ref 80.0–100.0)
Monocytes Absolute: 1 10*3/uL (ref 0.1–1.0)
Monocytes Relative: 9 %
Neutro Abs: 8.8 10*3/uL — ABNORMAL HIGH (ref 1.7–7.7)
Neutrophils Relative %: 81 %
Platelets: 220 10*3/uL (ref 150–400)
RBC: 3.85 MIL/uL — ABNORMAL LOW (ref 3.87–5.11)
RDW: 12.7 % (ref 11.5–15.5)
WBC: 10.7 10*3/uL — ABNORMAL HIGH (ref 4.0–10.5)
nRBC: 0 % (ref 0.0–0.2)

## 2019-09-11 LAB — CBG MONITORING, ED: Glucose-Capillary: 141 mg/dL — ABNORMAL HIGH (ref 70–99)

## 2019-09-11 LAB — PROTIME-INR
INR: 1 (ref 0.8–1.2)
Prothrombin Time: 12.9 seconds (ref 11.4–15.2)

## 2019-09-11 LAB — APTT: aPTT: 23 seconds — ABNORMAL LOW (ref 24–36)

## 2019-09-11 SURGERY — IR WITH ANESTHESIA
Anesthesia: General

## 2019-09-11 MED ORDER — SUGAMMADEX SODIUM 200 MG/2ML IV SOLN
INTRAVENOUS | Status: DC | PRN
  Administered 2019-09-11: 200 mg via INTRAVENOUS

## 2019-09-11 MED ORDER — SODIUM CHLORIDE 0.9 % IV SOLN
INTRAVENOUS | Status: DC
  Administered 2019-09-11 – 2019-09-12 (×2): via INTRAVENOUS

## 2019-09-11 MED ORDER — PHENYLEPHRINE 40 MCG/ML (10ML) SYRINGE FOR IV PUSH (FOR BLOOD PRESSURE SUPPORT)
PREFILLED_SYRINGE | INTRAVENOUS | Status: DC | PRN
  Administered 2019-09-11: 40 ug via INTRAVENOUS
  Administered 2019-09-11: 80 ug via INTRAVENOUS
  Administered 2019-09-11: 40 ug via INTRAVENOUS

## 2019-09-11 MED ORDER — IOHEXOL 300 MG/ML  SOLN
150.0000 mL | Freq: Once | INTRAMUSCULAR | Status: AC | PRN
  Administered 2019-09-11: 50 mL via INTRA_ARTERIAL

## 2019-09-11 MED ORDER — VANCOMYCIN HCL IN DEXTROSE 1-5 GM/200ML-% IV SOLN
INTRAVENOUS | Status: AC
  Filled 2019-09-11: qty 200

## 2019-09-11 MED ORDER — GLYCOPYRROLATE 0.2 MG/ML IJ SOLN
INTRAMUSCULAR | Status: DC | PRN
  Administered 2019-09-11 (×2): .1 mg via INTRAVENOUS

## 2019-09-11 MED ORDER — CLOPIDOGREL BISULFATE 300 MG PO TABS
ORAL_TABLET | ORAL | Status: AC
  Filled 2019-09-11: qty 1

## 2019-09-11 MED ORDER — STROKE: EARLY STAGES OF RECOVERY BOOK
Freq: Once | Status: AC
  Administered 2019-09-14: 09:00:00
  Filled 2019-09-11: qty 1

## 2019-09-11 MED ORDER — FENTANYL CITRATE (PF) 100 MCG/2ML IJ SOLN
INTRAMUSCULAR | Status: DC | PRN
  Administered 2019-09-11 (×2): 50 ug via INTRAVENOUS

## 2019-09-11 MED ORDER — TICAGRELOR 90 MG PO TABS
ORAL_TABLET | ORAL | Status: AC
  Filled 2019-09-11: qty 2

## 2019-09-11 MED ORDER — ASPIRIN 81 MG PO CHEW
CHEWABLE_TABLET | ORAL | Status: AC
  Filled 2019-09-11: qty 1

## 2019-09-11 MED ORDER — ACETAMINOPHEN 160 MG/5ML PO SOLN: 650.0000 mg | ORAL | Status: DC | PRN

## 2019-09-11 MED ORDER — CHLORHEXIDINE GLUCONATE 0.12 % MT SOLN
15.0000 mL | Freq: Two times a day (BID) | OROMUCOSAL | Status: DC
  Administered 2019-09-11 – 2019-09-17 (×11): 15 mL via OROMUCOSAL
  Filled 2019-09-11 (×7): qty 15

## 2019-09-11 MED ORDER — IOHEXOL 300 MG/ML  SOLN
150.0000 mL | Freq: Once | INTRAMUSCULAR | Status: AC | PRN
  Administered 2019-09-11: 25 mL via INTRA_ARTERIAL

## 2019-09-11 MED ORDER — ONDANSETRON HCL 4 MG/2ML IJ SOLN
INTRAMUSCULAR | Status: DC | PRN
  Administered 2019-09-11: 4 mg via INTRAVENOUS

## 2019-09-11 MED ORDER — ACETAMINOPHEN 650 MG RE SUPP: 650.0000 mg | RECTAL | Status: DC | PRN

## 2019-09-11 MED ORDER — CLEVIDIPINE BUTYRATE 0.5 MG/ML IV EMUL
INTRAVENOUS | Status: AC
  Filled 2019-09-11: qty 50

## 2019-09-11 MED ORDER — ORAL CARE MOUTH RINSE
15.0000 mL | Freq: Two times a day (BID) | OROMUCOSAL | Status: DC
  Administered 2019-09-12 – 2019-09-17 (×11): 15 mL via OROMUCOSAL

## 2019-09-11 MED ORDER — ROCURONIUM BROMIDE 10 MG/ML (PF) SYRINGE
PREFILLED_SYRINGE | INTRAVENOUS | Status: DC | PRN
  Administered 2019-09-11: 40 mg via INTRAVENOUS
  Administered 2019-09-11: 10 mg via INTRAVENOUS

## 2019-09-11 MED ORDER — ONDANSETRON HCL 4 MG/2ML IJ SOLN: 4.0000 mg | Freq: Four times a day (QID) | INTRAMUSCULAR | Status: DC | PRN

## 2019-09-11 MED ORDER — ACETAMINOPHEN 325 MG PO TABS: 650.0000 mg | ORAL_TABLET | ORAL | Status: DC | PRN

## 2019-09-11 MED ORDER — PROPOFOL 10 MG/ML IV BOLUS
INTRAVENOUS | Status: DC | PRN
  Administered 2019-09-11: 10 mg via INTRAVENOUS
  Administered 2019-09-11: 20 mg via INTRAVENOUS
  Administered 2019-09-11: 10 mg via INTRAVENOUS
  Administered 2019-09-11: 120 mg via INTRAVENOUS
  Administered 2019-09-11: 10 mg via INTRAVENOUS

## 2019-09-11 MED ORDER — NITROGLYCERIN 1 MG/10 ML FOR IR/CATH LAB
INTRA_ARTERIAL | Status: AC
  Filled 2019-09-11: qty 10

## 2019-09-11 MED ORDER — SENNOSIDES-DOCUSATE SODIUM 8.6-50 MG PO TABS: 1.0000 | ORAL_TABLET | Freq: Every evening | ORAL | Status: DC | PRN

## 2019-09-11 MED ORDER — EPTIFIBATIDE 20 MG/10ML IV SOLN
INTRAVENOUS | Status: AC
  Filled 2019-09-11: qty 10

## 2019-09-11 MED ORDER — TIROFIBAN HCL IN NACL 5-0.9 MG/100ML-% IV SOLN
INTRAVENOUS | Status: AC
  Filled 2019-09-11: qty 100

## 2019-09-11 MED ORDER — SODIUM CHLORIDE 0.9% FLUSH
3.0000 mL | Freq: Once | INTRAVENOUS | Status: DC
Start: 2019-09-11 — End: 2019-09-12

## 2019-09-11 MED ORDER — VANCOMYCIN HCL 1000 MG IV SOLR
INTRAVENOUS | Status: DC | PRN
  Administered 2019-09-11: 1000 mg via INTRAVENOUS

## 2019-09-11 MED ORDER — ACETAMINOPHEN 160 MG/5ML PO SOLN
650.0000 mg | ORAL | Status: DC | PRN
  Administered 2019-09-14 – 2019-09-17 (×2): 650 mg
  Filled 2019-09-11 (×2): qty 20.3

## 2019-09-11 MED ORDER — SUCCINYLCHOLINE CHLORIDE 200 MG/10ML IV SOSY
PREFILLED_SYRINGE | INTRAVENOUS | Status: DC | PRN
  Administered 2019-09-11: 80 mg via INTRAVENOUS

## 2019-09-11 MED ORDER — CLEVIDIPINE BUTYRATE 0.5 MG/ML IV EMUL
INTRAVENOUS | Status: DC | PRN
  Administered 2019-09-11: 3 mg/h via INTRAVENOUS

## 2019-09-11 MED ORDER — CLEVIDIPINE BUTYRATE 0.5 MG/ML IV EMUL
0.0000 mg/h | INTRAVENOUS | Status: AC
  Administered 2019-09-11: 8 mg/h via INTRAVENOUS
  Administered 2019-09-11: 12 mg/h via INTRAVENOUS
  Administered 2019-09-11: 2 mg/h via INTRAVENOUS
  Administered 2019-09-11: 19:00:00 14 mg/h via INTRAVENOUS
  Administered 2019-09-12 (×2): 11 mg/h via INTRAVENOUS
  Filled 2019-09-11: qty 100
  Filled 2019-09-11 (×3): qty 50

## 2019-09-11 MED ORDER — CHLORHEXIDINE GLUCONATE CLOTH 2 % EX PADS
6.0000 | MEDICATED_PAD | Freq: Every day | CUTANEOUS | Status: DC
  Administered 2019-09-11 – 2019-09-13 (×3): 6 via TOPICAL

## 2019-09-11 MED ORDER — LACTATED RINGERS IV SOLN
INTRAVENOUS | Status: DC | PRN
  Administered 2019-09-11: 08:00:00 via INTRAVENOUS

## 2019-09-11 MED ORDER — PHENYLEPHRINE HCL-NACL 10-0.9 MG/250ML-% IV SOLN
INTRAVENOUS | Status: DC | PRN
  Administered 2019-09-11: 40 ug/min via INTRAVENOUS

## 2019-09-11 MED ORDER — IOHEXOL 350 MG/ML SOLN
100.0000 mL | Freq: Once | INTRAVENOUS | Status: AC | PRN
  Administered 2019-09-11: 100 mL via INTRAVENOUS

## 2019-09-11 NOTE — ED Notes (Signed)
Pt responds to family with facial expressions, holds her daughters hand.

## 2019-09-11 NOTE — Transfer of Care (Signed)
Immediate Anesthesia Transfer of Care Note  Patient: April Le  Procedure(s) Performed: IR WITH ANESTHESIA (N/A )  Patient Location: PACU  Anesthesia Type:General  Level of Consciousness: alert  and patient cooperative  Airway & Oxygen Therapy: Patient Spontanous Breathing and Patient connected to face mask oxygen  Post-op Assessment: Report given to RN and Post -op Vital signs reviewed and stable  Post vital signs: Reviewed and stable Art line bp 125/67  Last Vitals:  Vitals Value Taken Time  BP 93/45 09/11/19 1030  Temp    Pulse 58 09/11/19 1036  Resp 13 09/11/19 1036  SpO2 94 % 09/11/19 1036  Vitals shown include unvalidated device data.  Last Pain: There were no vitals filed for this visit.       Complications: No apparent anesthesia complications

## 2019-09-11 NOTE — Anesthesia Procedure Notes (Signed)
Arterial Line Insertion Start/End10/28/2020 8:15 AM Performed by: Brennan Bailey, MD, Lavell Luster, CRNA, CRNA  Patient location: ED. Preanesthetic checklist: patient identified, IV checked, risks and benefits discussed, surgical consent, monitors and equipment checked and pre-op evaluation Patient sedated Left, radial was placed Catheter size: 20 G Hand hygiene performed  and maximum sterile barriers used   Attempts: 1 Procedure performed without using ultrasound guided technique. Following insertion, Biopatch and dressing applied. Post procedure assessment: normal and unchanged  Patient tolerated the procedure well with no immediate complications.

## 2019-09-11 NOTE — Anesthesia Procedure Notes (Addendum)
Arterial Line Insertion Performed by: Lavell Luster, CRNA, CRNA  Preanesthetic checklist: patient identified, IV checked, risks and benefits discussed, surgical consent, monitors and equipment checked, pre-op evaluation and timeout performed Left, radial was placed Catheter size: 20 G Hand hygiene performed , maximum sterile barriers used  and Seldinger technique used  Attempts: 1 Procedure performed without using ultrasound guided technique. Ultrasound Notes:anatomy identified, needle tip was noted to be adjacent to the nerve/plexus identified and no ultrasound evidence of intravascular and/or intraneural injection Following insertion, dressing applied and Biopatch. Post procedure assessment: normal  Patient tolerated the procedure well with no immediate complications.

## 2019-09-11 NOTE — Consult Note (Signed)
INR . POst procedure CT brain No ICH or mass effect or shift. Extubated without difficulty.Pupils 35mm RT = LT Moving LT sided spontaneously and able to bend RT knee. Squeezes with LT hand on command. RT groin soft  with arterial puncture site sealed with angioseal closure device. Distal pulses all palpable bilaterally unchanged. S.Brain Honeycutt MD

## 2019-09-11 NOTE — ED Notes (Signed)
IR team here with pt.  Family to a waiting room at this time.

## 2019-09-11 NOTE — Anesthesia Preprocedure Evaluation (Addendum)
Anesthesia Evaluation  Patient identified by MRN, date of birth, ID band Patient awake    Reviewed: Allergy & Precautions, NPO status , Patient's Chart, lab work & pertinent test results, reviewed documented beta blocker date and time   History of Anesthesia Complications Negative for: history of anesthetic complications  Airway Mallampati: II  TM Distance: >3 FB Neck ROM: Full    Dental no notable dental hx.    Pulmonary sleep apnea , former smoker,    Pulmonary exam normal        Cardiovascular hypertension, Pt. on medications and Pt. on home beta blockers + CAD  Normal cardiovascular exam     Neuro/Psych CVA (2008, 2010, 2018, acute CVA 09/11/19), Residual Symptoms negative psych ROS   GI/Hepatic Neg liver ROS, GERD  ,  Endo/Other  Hypothyroidism   Renal/GU Renal InsufficiencyRenal disease  negative genitourinary   Musculoskeletal  (+) Arthritis ,   Abdominal   Peds  Hematology negative hematology ROS (+)   Anesthesia Other Findings Day of surgery medications reviewed with patient.  Reproductive/Obstetrics negative OB ROS                            Anesthesia Physical Anesthesia Plan  ASA: III and emergent  Anesthesia Plan: General   Post-op Pain Management:    Induction: Intravenous and Rapid sequence  PONV Risk Score and Plan: 3 and Treatment may vary due to age or medical condition and Ondansetron  Airway Management Planned: Oral ETT and Video Laryngoscope Planned  Additional Equipment: Arterial line  Intra-op Plan:   Post-operative Plan: Extubation in OR  Informed Consent: I have reviewed the patients History and Physical, chart, labs and discussed the procedure including the risks, benefits and alternatives for the proposed anesthesia with the patient or authorized representative who has indicated his/her understanding and acceptance.     Dental advisory  given  Plan Discussed with: CRNA  Anesthesia Plan Comments: (Pt aphasic but alert and responds with nodding. Consent reviewed with patient as well as patient's family in ED. Daiva Huge, MD)        Anesthesia Quick Evaluation

## 2019-09-11 NOTE — ED Notes (Signed)
Pt to IR with staff.  Report given, family to waiting room.

## 2019-09-11 NOTE — ED Notes (Signed)
  Decision to go to IR.  Anesthesia called and family consented with MD.

## 2019-09-11 NOTE — ED Provider Notes (Addendum)
TIME SEEN: 6:24 AM  CHIEF COMPLAINT: Code stroke  HPI: Patient is a 78 year old female who has had a previous stroke, chronic kidney disease, CHF, hypertension who presents to the emergency department as a code stroke.  Brought in by [facility] who provides most of the history.  They report patient went to bed around 11:30 PM last night in normal state of health.  Woke up this morning around 4:45 AM and was noted to have right-sided weakness, left-sided gaze preference, aphasia.  Blood pressure in the [id number] systolic with EMS.  Normal blood glucose of 161.  ROS: Level 5 caveat secondary to acuity  PAST MEDICAL HISTORY/PAST SURGICAL HISTORY:  Past Medical History:  Diagnosis Date  . Abdominal distention    past two months  . Abnormality of gait 10/28/2015  . 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 Arthritis    right knee  . Benign essential HTN 11/19/2013  . CAP (community acquired pneumonia) 09/07/2013  . CHF (congestive heart failure) ([place])   . Chronic diastolic heart failure ([place]) 11/19/2013  . Chronic kidney disease    STAGE III KIDNEY DISEASE-PER PT--PT SEES DR. Risa Grill UROLOGIST  . Complication of anesthesia    PT REMEMBERS BREATHING PROBLEMS WAKING UP FROM KNEE SURGERY AT [facility] 2011 OR 2012  . Coronary artery calcification seen on CT scan 01/10/2017  . Fever of unknown origin 09/04/2013  . GERD (gastroesophageal reflux disease)   . Headache   . Headache(784.0)   . 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 Hypercholesteremia   . Hypertension   . Hypertensive heart disease with heart failure ([place]) 01/10/2017  . Hypothyroidism   . Hypoxia 09/04/2013  . Leukocytosis 09/04/2013  . Mass of ovary 10/26/2011  . OSA (obstructive sleep apnea) 11/19/2013  . Pneumonia   . Shortness of breath    WITH EXERTION  . Sinus drainage    PT STARTED ANTIBIOTIC 11/17/11 --IS HOARSE TODAY, SOME WHEEZING  AND COUGH WITH YELLOW DRAINAGE  . SIRS (systemic inflammatory response syndrome) ([place]) 09/04/2013  . SOB (shortness of breath) 09/04/2013  . Stroke ([place]) 02/2007, 08/2009   2 total -RESIDUAL WEAKNESS LEFT LEG--AND APHASIA  . Stroke ([place]) 09/01/2017  . Stroke ([place]) 09/08/2017  . Tumor of ovary   . Urinary incontinence   . Urinary incontinence   . Weakness of left leg    Mildly, post stroke    MEDICATIONS:  Prior to Admission medications   Medication Sig Start Date End Date Taking? Authorizing Provider  aspirin 81 MG tablet Take 81 mg by mouth daily with breakfast.     [provider]  atorvastatin (LIPITOR) 40 MG tablet Take 40 mg by mouth at bedtime.     [provider]  clopidogrel (PLAVIX) 75 MG tablet Take 75 mg by mouth daily.     [provider]  donepezil (ARICEPT) 5 MG tablet Take 5 mg by mouth daily. 07/13/19   [provider]  febuxostat (ULORIC) 40 MG tablet Take 40 mg by mouth daily.     [provider]  furosemide (LASIX) 40 MG tablet Take 1 tablet (40 mg total) by mouth 2 (two) times daily. 01/15/19   Richardo Priest, MD  hydrALAZINE (APRESOLINE) 25 MG tablet Take 0.5 tablet (12.5 mg) twice daily if your systolic blood pressure (top number) is consistently greater than 140 08/22/19   Munley, Hilton Cork, MD  levothyroxine (SYNTHROID, LEVOTHROID) 50 MCG tablet Take 50 mcg by mouth daily at 6 (six) AM.     [provider]  metoprolol (LOPRESSOR) 50 MG tablet Take 50 mg by mouth 2 (two) times daily. One at 9 am and at 8pm    [provider]  Multiple Vitamins-Minerals (MULTI-VITAMIN GUMMIES PO) Take 1 tablet by mouth daily.    [provider]  pantoprazole (PROTONIX) 40 MG tablet Take 40 mg by mouth daily.     [provider]  potassium chloride SA (K-DUR,KLOR-CON) 20 MEQ tablet Take 20 mEq by mouth daily.     [provider]    ALLERGIES:  Allergies  Allergen Reactions  . Amoxicillin-Pot  Clavulanate Diarrhea    GI INTOLERANCE ONLY WITH DIARRHEA    SOCIAL HISTORY:  Social History   Tobacco Use  . Smoking status: Former Smoker    Types: Cigarettes    Quit date: 11/15/1959    Years since quitting: 59.8  . Smokeless tobacco: Never Used  Substance Use Topics  . Alcohol use: Yes    Alcohol/week: 0.0 standard drinks    Comment: very rarely    FAMILY HISTORY: Family History  Problem Relation Age of Onset  . Kidney failure Mother   . Heart attack Father   . Heart failure Father   . Stomach cancer Other   . Stomach cancer Maternal Grandmother   . Stroke Paternal Grandmother     EXAM: BP (!) 165/63 (BP Location: Left Arm)   Pulse 67   Resp 16   SpO2 94%  CONSTITUTIONAL: Alert but does not answer questions or follow commands HEAD: Normocephalic, atraumatic EYES: Conjunctivae clear, pupils appear equal, patient has a left gaze preference ENT: normal nose; moist mucous membranes NECK: Supple, no meningismus, no nuchal rigidity, no LAD  CARD: RRR; S1 and S2 appreciated; no murmurs, no clicks, no rubs, no gallops RESP: Normal chest excursion without splinting or tachypnea; breath sounds clear and equal bilaterally; no wheezes, no rhonchi, no rales, no hypoxia or respiratory distress ABD/GI: Normal bowel sounds; non-distended; soft, non-tender, no rebound, no guarding, no peritoneal signs, no hepatosplenomegaly BACK:  The back appears normal and is non-tender to palpation, there is no CVA tenderness EXT: Normal ROM in all joints; non-tender to palpation; no edema; normal capillary refill; no cyanosis, no calf tenderness or swelling    SKIN: Normal color for age and race; warm; no rash NEURO: Patient has right-sided weakness in the upper and lower extremity as well as a right-sided facial droop, patient has aphasia.  She has a left gaze preference but will cross midline intermittently   MEDICAL DECISION MAKING: Patient here as a code stroke.  Dr. Lorraine Lax with neurology at  [facility] along with myself on patient arrival.  Labs, imaging ordered.  Taken emergently to head CT with neurology.  ED PROGRESS: Noncon head CT shows no acute abnormality.  Patient will be taken to interventional radiology.  Dr. Lorraine Lax has talked to patient's family.  Anesthesia will intubate in the ED.  I reviewed all nursing notes and pertinent previous records as available.  I have interpreted any EKGs, lab and urine results, imaging (as available).  CTA/perfusion findings:  Left distal M1 embolism extending into proximal M2 branches with poorly enhancing downstream vessels. There has been a remote moderate left lateral frontal and upper insular infarct; CT perfusion maps show 38 cc of completed acute infarct and 56 cc of superimposed penumbra.   CRITICAL CARE Performed by: Pryor Curia  Total critical care time: 43 minutes  Critical care time was exclusive of separately billable procedures and treating other patients.  Critical care was necessary to treat or prevent imminent or life-threatening deterioration.  Critical care was time spent personally by me on the following activities: development of treatment plan with patient and/or surrogate as well as nursing, discussions with consultants, evaluation of patient's response to treatment, examination of patient, obtaining history from patient or surrogate, ordering and performing treatments and interventions, ordering and review of laboratory studies, ordering and review of radiographic studies, pulse oximetry and re-evaluation of patient's condition.   Ju Agreda was evaluated in Emergency Department on 09/11/2019 for the symptoms described in the history of present illness. She was evaluated in the context of the global COVID-19 pandemic, which necessitated consideration that the patient might be at risk for infection with the SARS-CoV-2 virus that causes COVID-19. Institutional protocols and algorithms that pertain to the evaluation of  patients at risk for COVID-19 are in a state of rapid change based on information released by regulatory bodies including the CDC and federal and state organizations. These policies and algorithms were followed during the patient's care in the ED.    Ward, Delice Bison, DO 09/11/19 [place], Delice Bison, DO 09/11/19 [place], Delice Bison, DO 09/11/19 [phone]

## 2019-09-11 NOTE — Progress Notes (Signed)
Patient ID: April Le, female   DOB: [birth year], 78 y.o.   MRN: [id number] INR.78 Y RT H F LSN  11.30 pm MRSS of 3 . New onset Lt gaze and RT sided weakness and aphasia. CT Brain NO ICH ASPECTS  Not scored . CTA occluded Lt MCA M 1 sg. CTP MAPS   Core of 34 ml  Versus Tmax> 6 s of 73ml with a mismatch of 56 ml . Option of endovascular revascularization D/W spouse and daughter in a 3 way conversation with neurology.. Procedure ,reasons and alternatives reviewed. Risks of ICH of 10 % ,worsenimng neuro function death ,inability to revascularize vascular injury discussed. Souse and daughter consented to the procedure with the knowledge that the patient would probably not return to preictal event,and may require constant care arrangements. S.Vonzell Lindblad MD

## 2019-09-11 NOTE — Procedures (Signed)
S/P Lt common carotid arteriogram followed by complete revascularization of occluded Lt LMCA M 1 seg with x 1 pass with the embotrap 75mm x 71mm retriver device and x 1 pass with the solitaire 24mmx 40 mmX retriever device achieving a TICI 3 revascularization.

## 2019-09-11 NOTE — Code Documentation (Signed)
I arrived to CT 3 with ED nurses and Neuro MD. 18G PIV had just been placed, several attempts were made by staff to acquire IV access. CTP was done and patient was taken back to the ED while results were being finalized. Neuro MD and NVIR MD went to talk to the family. Patient was prepped for NVIR and the decision to go was made at 0735, Anesthesia was called at 0735. They arrived promptly at 0740, patient was successfully intubated and taken straight to the IR suite. NIH remained 22.

## 2019-09-11 NOTE — Anesthesia Postprocedure Evaluation (Signed)
Anesthesia Post Note  Patient: Leshonda Milk  Procedure(s) Performed: IR WITH ANESTHESIA (N/A )     Patient location during evaluation: PACU Anesthesia Type: General Level of consciousness: awake and alert and oriented Pain management: pain level controlled Vital Signs Assessment: post-procedure vital signs reviewed and stable Respiratory status: spontaneous breathing, nonlabored ventilation and respiratory function stable Cardiovascular status: blood pressure returned to baseline Postop Assessment: no apparent nausea or vomiting Anesthetic complications: no    Last Vitals:  Vitals:   09/11/19 1000 09/11/19 1010  BP: 92/76 (!) 108/57  Pulse: 80 65  Resp: 18 17  Temp:    SpO2: 98% 93%    Last Pain: There were no vitals filed for this visit.               Brennan Bailey

## 2019-09-11 NOTE — Progress Notes (Signed)
Bedside report given to [facility], RN, PACU. Right groin checked, dressing d/c/i, level 0.

## 2019-09-11 NOTE — H&P (Signed)
Chief Complaint: Non verbal, right side weakness on waking up   History obtained from: Patient and Chart    HPI:                                                                                                                                       April Le is a 78 y.o. female with past medical history significant for congestive heart failure(chronic diastolic heart failure), prior stroke, hypertension, hyperlipidemia and prior stroke with mild left leg weakness and mild aphasia" emergency department as a code stroke for left gaze deviation, right-sided weakness and aphasia.  Was normal 11:30 PM last night when she went to bed.  Husband tried to wake her up this morning patient was noted to be nonverbal, facial droop and on the right side and he called EMS.    On arrival NIH stroke scale was performed and was 22.  Stat CT head was obtained which showed no acute hemorrhage/infarct.  CT head did demonstrate old infarcts most notably left MCA stroke and R PCA stroke.  CT angiogram and CT perfusion was delayed due to difficulty getting IV line, however CT angiogram did show left M1 occlusion and CT perfusion showed a 38 cc core with 56 cc penumbra.  Baseline modified Rankin score was 3, patient able to shower on her own and use the bathroom, watches TV and speaks in sentences however has difficulty reading and writing and walks with a cane.  Date last known well: 10.28 or 20 Time last known well: 11:30 PM tPA Given: No, outside TPA window NIHSS: 22 Baseline MRS 3    Past Medical History:  Diagnosis Date  . Abdominal distention    past two months  . Abnormality of gait 10/28/2015  . 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 Arthritis    right knee  . Benign essential HTN 11/19/2013  . CAP (community acquired pneumonia) 09/07/2013  . CHF (congestive heart failure) ([place])   . Chronic diastolic heart failure ([place]) 11/19/2013  . Chronic kidney disease    STAGE III  KIDNEY DISEASE-PER PT--PT SEES DR. Risa Grill UROLOGIST  . Complication of anesthesia    PT REMEMBERS BREATHING PROBLEMS WAKING UP FROM KNEE SURGERY AT [facility] 2011 OR 2012  . Coronary artery calcification seen on CT scan 01/10/2017  . Fever of unknown origin 09/04/2013  . GERD (gastroesophageal reflux disease)   . Headache   . Headache(784.0)   . 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 Hypercholesteremia   . Hypertension   . Hypertensive heart disease with heart failure ([place]) 01/10/2017  . Hypothyroidism   . Hypoxia 09/04/2013  . Leukocytosis 09/04/2013  . Mass of ovary 10/26/2011  . OSA (obstructive sleep apnea) 11/19/2013  . Pneumonia   . Shortness of breath  WITH EXERTION  . Sinus drainage    PT STARTED ANTIBIOTIC 11/17/11 --IS HOARSE TODAY, SOME WHEEZING AND COUGH WITH YELLOW DRAINAGE  . SIRS (systemic inflammatory response syndrome) ([place]) 09/04/2013  . SOB (shortness of breath) 09/04/2013  . Stroke ([place]) 02/2007, 08/2009   2 total -RESIDUAL WEAKNESS LEFT LEG--AND APHASIA  . Stroke ([place]) 09/01/2017  . Stroke ([place]) 09/08/2017  . Tumor of ovary   . Urinary incontinence   . Urinary incontinence   . Weakness of left leg    Mildly, post stroke    Past Surgical History:  Procedure Laterality Date  . ABDOMINAL HYSTERECTOMY    . CHOLECYSTECTOMY    . LAPAROTOMY  11/22/2011   Procedure: EXPLORATORY LAPAROTOMY;  Surgeon: Imagene Gurney A. Alycia Rossetti, MD;  Location: [facility] ORS;  Service: Gynecology;  Laterality: N/A;  . MENISCUS REPAIR    . SALPINGOOPHORECTOMY  11/22/2011   Procedure: SALPINGO OOPHERECTOMY;  Surgeon: Imagene Gurney A. Alycia Rossetti, MD;  Location: [facility] ORS;  Service: Gynecology;  Laterality: Bilateral;  . TOE SURGERY    . TONSILLECTOMY    . TUBAL LIGATION      Family History  Problem Relation Age of Onset  . Kidney failure Mother   . Heart attack Father   . Heart failure Father   . Stomach cancer Other   . Stomach cancer Maternal  Grandmother   . Stroke Paternal Grandmother    Social History:  reports that she quit smoking about 59 years ago. Her smoking use included cigarettes. She has never used smokeless tobacco. She reports current alcohol use. She reports that she does not use drugs.  Allergies:  Allergies  Allergen Reactions  . Amoxicillin-Pot Clavulanate Diarrhea    GI INTOLERANCE ONLY WITH DIARRHEA    Medications:                                                                                                                        I reviewed home medications   ROS:                                                                                                                                     Unable to obtain   Examination:  General: Appears well-developed  Psych: Affect appropriate to situation Eyes: No scleral injection HENT: No OP obstrucion Head: Normocephalic.  Cardiovascular: Normal rate and regular rhythm. Respiratory: Effort normal and breath sounds normal to anterior ascultation GI: Soft.  No distension. There is no tenderness.  Skin: WDI    Neurological Examination Mental Status: Alert, nonverbal and not following any commands. Cranial Nerves: II: Visual fields : Right homonymous hemianopsia III,IV, VI: ptosis not present, left gaze deviation but crosses midline pupils equal, round, reactive to light and accommodation VII: Right facial droop XII: midline tongue extension Motor: Right : Upper extremity   1/5    Left:     Upper extremity   5/5  Lower extremity   2/5     Lower extremity   5/5 Tone and bulk:normal tone throughout; no atrophy noted Sensory: Reduced sensation to light touch on the right side Plantars: Right: downgoing   Left: downgoing Cerebellar: No gross ataxia noted on the left side      Lab Results: Basic Metabolic Panel: No results for input(s): NA, K,  CL, CO2, GLUCOSE, BUN, CREATININE, CALCIUM, MG, PHOS in the last 168 hours.  CBC: No results for input(s): WBC, NEUTROABS, HGB, HCT, MCV, PLT in the last 168 hours.  Coagulation Studies: No results for input(s): LABPROT, INR in the last 72 hours.  Imaging: No results found.   ASSESSMENT AND PLAN  78 y.o. female with past medical history significant for congestive heart failure(chronic diastolic heart failure), prior stroke, hypertension, hyperlipidemia and prior stroke with mild left leg weakness and mild aphasia" emergency department as a code stroke for left gaze deviation, right-sided weakness and aphasia secondary to left MCA stroke.  Detailed discussion was had with family by me and Dr. Patrecia Pour regarding risk versus benefits of proceeding with mechanical thrombectomy.  Patient does have some impairment in baseline function with modified Rankin score of 3, however her current stroke would be severely disabling leaving her bedbound and aphasic.  She does have a significantly large penumbra and we also feel that estimated core of 38 cc is partly calculated including old stroke in the left MCA region.  Patient was taken to mechanical thrombectomy under IRB protocol after detailed discussion.   Acute left MCA stroke, currently receiving mechanical thrombectomy  # MRI of the brain without contrast #Transthoracic Echo  # Start patient on ASA 325mg  daily #Start or continue Atorvastatin 40 mg/other high intensity statin # BP goal: To be determined on completion of IR procedure # HBAIC and Lipid profile # Telemetry monitoring # Frequent neuro checks # NPO until passes stroke swallow screen  Congestive heart failure -Avoid volume overload -TTE pending  Hypertension -BP goal to be determined after IR procedure   Prior CVA -Antiplatelet/anticoagulation recommendations to be determined after completion of stroke work-up this admission  Hyperlipidemia -Lipid profile  pending -Continue high-dose statin  Full code DVT prophylaxis: SCD for now   Please page stroke NP  Or  PA  Or MD from 8am -4 pm  as this patient from this time will be  followed by the stroke.   You can look them up on [email or web address]  Password [email or web address]    This patient is neurologically critically ill due to left MCA with left M1 occlusion.  She is at risk for significant risk of neurological worsening from cerebral edema,  death from brain herniation, heart failure, hemorrhagic conversion, infection, respiratory failure and seizure. This patient's care requires constant monitoring of vital signs, hemodynamics, respiratory and cardiac  monitoring, review of multiple databases, neurological assessment, discussion with family, other specialists and medical decision making of high complexity.  I spent 65 minutes of neurocritical time in the care of this patient.       [facility] Pager Number [phone]

## 2019-09-11 NOTE — Progress Notes (Addendum)
STROKE TEAM PROGRESS NOTE   HISTORY OF PRESENT ILLNESS (per record) April Le is a 78 y.o. female with past medical history significant for congestive heart failure(chronic diastolic heart failure), prior stroke, hypertension, hyperlipidemia and prior stroke with residual mild left leg weakness and mild aphasia". She presented to the emergency department as a code stroke for left gaze deviation, right-sided weakness and aphasia on 09/11/19 when she was found non-verbal with right side weakness and facial droop. Upon arrival, NIH stroke scale was 22.  Stat CT head was obtained which showed no acute hemorrhage/infarct.  CT head did demonstrate old infarcts most notably left MCA stroke and R PCA stroke.  CT angiogram and CT perfusion showed left M1 occlusion and CT perfusion showed a 38 cc core with 56 cc penumbra. She was taken emergently to INR for thrombectomy.   Date last known well: 10.28 or 20 Time last known well: 11:30 PM tPA Given: No, outside TPA window NIHSS: 22 Baseline MRS 3  INTERVAL HISTORY Patient first seen in PACU s/p IA thrombectomy. She is still sedated partially and agitated and unable to follow commands at that time.  She has aphasia, right hemiplegia but no gaze deviation.  OBJECTIVE Vitals:   09/11/19 1141 09/11/19 1230 09/11/19 1331 09/11/19 1439  BP: 134/71 121/68 126/72 (!) 112/56  Pulse: 68 64 65 71  Resp: 18 17 15 15   Temp:      SpO2: 94% 99% 96% 95%    CBC:  Recent Labs  Lab 09/11/19 0633 09/11/19 0817  WBC  --  10.7*  NEUTROABS  --  8.8*  HGB 12.9 11.8*  HCT 38.0 35.8*  MCV  --  93.0  PLT  --  [id number]    Basic Metabolic Panel:  Recent Labs  Lab 09/11/19 0633 09/11/19 0817  NA 141 139  K 4.1 3.9  CL 108 105  CO2  --  23  GLUCOSE 147* 158*  BUN 35* 30*  CREATININE 1.20* 1.25*  CALCIUM  --  8.8*    Lipid Panel: No results found for: CHOL, TRIG, HDL, CHOLHDL, VLDL, LDLCALC HgbA1c: No results found for: HGBA1C Urine Drug Screen: No results  found for: LABOPIA, COCAINSCRNUR, LABBENZ, AMPHETMU, THCU, LABBARB  Alcohol Level No results found for: ETH  IMAGING   Ct Code Stroke Cta Head W/wo Contrast  Result Date: 09/11/2019 CLINICAL DATA:  Stroke symptoms EXAM: CT ANGIOGRAPHY HEAD AND NECK CT PERFUSION BRAIN TECHNIQUE: Multidetector CT imaging of the head and neck was performed using the standard protocol during bolus administration of intravenous contrast. Multiplanar CT image reconstructions and MIPs were obtained to evaluate the vascular anatomy. Carotid stenosis measurements (when applicable) are obtained utilizing NASCET criteria, using the distal internal carotid diameter as the denominator. Multiphase CT imaging of the brain was performed following IV bolus contrast injection. Subsequent parametric perfusion maps were calculated using RAPID software. CONTRAST:  137mL OMNIPAQUE IOHEXOL 350 MG/ML SOLN COMPARISON:  Head CT from earlier today. FINDINGS: Delayed study relative to the noncontrast CT due to difficulty with IV access per report. CTA NECK FINDINGS Aortic arch: Atherosclerotic calcification.  Three vessel branching. Right carotid system: Limited atheromatous changes. No stenosis or ulceration. Left carotid system: Limited atheromatous changes for age. No stenosis or ulceration. Vertebral arteries: Proximal subclavian atherosclerosis is mild. Widely patent vertebral arteries with smooth luminal contour. Skeleton: No acute finding.  Cervical spine degeneration. Other neck: No significant incidental finding Upper chest: Negative Review of the MIP images confirms the above findings CTA HEAD FINDINGS  Anterior circulation: Central branching filling defect at the left distal M1 and proximal M2 segments with underfilling of downstream vessels. No right-sided embolism or branch occlusion is seen. Hypoplastic left A1 segment. Atherosclerotic plaque on both carotid siphons mild to moderate narrowing on the right. Posterior circulation:  Vertebrobasilar arteries are smooth and widely patent. No branch occlusion or beading. Negative for aneurysm Venous sinuses: Unremarkable in the arterial phase Anatomic variants: As above Review of the MIP images confirms the above findings CT Brain Perfusion Findings: ASPECTS: Not scored given the extent of chronic changes. No acute infarct is detected. CBF (<30%) Volume: 31mL-some of this mapping may be overestimated as there is likely some bleeding into the chronic infarct the lateral frontal lobe. Perfusion (Tmax>6.0s) volume: 66mL Mismatch Volume: 56 with mismatched ratio of 2.27mL Infarction Location:Left posterior frontal These results were communicated to Dr. Lorraine Lax at [place] 10/28/2020by text page via the [facility] messaging system. Embolectomy procedure has already been ordered. IMPRESSION: 1. 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 There has been a remote moderate left lateral frontal and upper insular infarct; CT perfusion maps show 38 cc of completed acute infarct and 56 cc of superimposed penumbra. 2. Limited atherosclerosis for age. No underlying flow limiting stenosis or embolic source identified. Electronically Signed   By: Monte Fantasia M.D.   On: 09/11/2019 07:27   Ct Code Stroke Cta Neck W/wo Contrast  Result Date: 09/11/2019 CLINICAL DATA:  Stroke symptoms EXAM: CT ANGIOGRAPHY HEAD AND NECK CT PERFUSION BRAIN TECHNIQUE: Multidetector CT imaging of the head and neck was performed using the standard protocol during bolus administration of intravenous contrast. Multiplanar CT image reconstructions and MIPs were obtained to evaluate the vascular anatomy. Carotid stenosis measurements (when applicable) are obtained utilizing NASCET criteria, using the distal internal carotid diameter as the denominator. Multiphase CT imaging of the brain was performed following IV bolus contrast injection. Subsequent parametric perfusion maps were  calculated using RAPID software. CONTRAST:  12mL OMNIPAQUE IOHEXOL 350 MG/ML SOLN COMPARISON:  Head CT from earlier today. FINDINGS: Delayed study relative to the noncontrast CT due to difficulty with IV access per report. CTA NECK FINDINGS Aortic arch: Atherosclerotic calcification.  Three vessel branching. Right carotid system: Limited atheromatous changes. No stenosis or ulceration. Left carotid system: Limited atheromatous changes for age. No stenosis or ulceration. Vertebral arteries: Proximal subclavian atherosclerosis is mild. Widely patent vertebral arteries with smooth luminal contour. Skeleton: No acute finding.  Cervical spine degeneration. Other neck: No significant incidental finding Upper chest: Negative Review of the MIP images confirms the above findings CTA HEAD FINDINGS Anterior circulation: Central branching filling defect at the left distal M1 and proximal M2 segments with underfilling of downstream vessels. No right-sided embolism or branch occlusion is seen. Hypoplastic left A1 segment. Atherosclerotic plaque on both carotid siphons mild to moderate narrowing on the right. Posterior circulation: Vertebrobasilar arteries are smooth and widely patent. No branch occlusion or beading. Negative for aneurysm Venous sinuses: Unremarkable in the arterial phase Anatomic variants: As above Review of the MIP images confirms the above findings CT Brain Perfusion Findings: ASPECTS: Not scored given the extent of chronic changes. No acute infarct is detected. CBF (<30%) Volume: 3mL-some of this mapping may be overestimated as there is likely some bleeding into the chronic infarct the lateral frontal lobe. Perfusion (Tmax>6.0s) volume: 93mL Mismatch Volume: 56 with mismatched ratio of 2.65mL Infarction Location:Left posterior frontal These results were communicated to Dr. Lorraine Lax at 7:25 amon 10/28/2020by  text page via the [facility] messaging system. Embolectomy procedure has already been ordered. IMPRESSION: 1.  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 There has been a remote moderate left lateral frontal and upper insular infarct; CT perfusion maps show 38 cc of completed acute infarct and 56 cc of superimposed penumbra. 2. Limited atherosclerosis for age. No underlying flow limiting stenosis or embolic source identified. Electronically Signed   By: Monte Fantasia M.D.   On: 09/11/2019 07:27   Ct Code Stroke Cta Cerebral Perfusion W/wo Contrast  Result Date: 09/11/2019 CLINICAL DATA:  Stroke symptoms EXAM: CT ANGIOGRAPHY HEAD AND NECK CT PERFUSION BRAIN TECHNIQUE: Multidetector CT imaging of the head and neck was performed using the standard protocol during bolus administration of intravenous contrast. Multiplanar CT image reconstructions and MIPs were obtained to evaluate the vascular anatomy. Carotid stenosis measurements (when applicable) are obtained utilizing NASCET criteria, using the distal internal carotid diameter as the denominator. Multiphase CT imaging of the brain was performed following IV bolus contrast injection. Subsequent parametric perfusion maps were calculated using RAPID software. CONTRAST:  152mL OMNIPAQUE IOHEXOL 350 MG/ML SOLN COMPARISON:  Head CT from earlier today. FINDINGS: Delayed study relative to the noncontrast CT due to difficulty with IV access per report. CTA NECK FINDINGS Aortic arch: Atherosclerotic calcification.  Three vessel branching. Right carotid system: Limited atheromatous changes. No stenosis or ulceration. Left carotid system: Limited atheromatous changes for age. No stenosis or ulceration. Vertebral arteries: Proximal subclavian atherosclerosis is mild. Widely patent vertebral arteries with smooth luminal contour. Skeleton: No acute finding.  Cervical spine degeneration. Other neck: No significant incidental finding Upper chest: Negative Review of the MIP images confirms the above findings CTA HEAD FINDINGS  Anterior circulation: Central branching filling defect at the left distal M1 and proximal M2 segments with underfilling of downstream vessels. No right-sided embolism or branch occlusion is seen. Hypoplastic left A1 segment. Atherosclerotic plaque on both carotid siphons mild to moderate narrowing on the right. Posterior circulation: Vertebrobasilar arteries are smooth and widely patent. No branch occlusion or beading. Negative for aneurysm Venous sinuses: Unremarkable in the arterial phase Anatomic variants: As above Review of the MIP images confirms the above findings CT Brain Perfusion Findings: ASPECTS: Not scored given the extent of chronic changes. No acute infarct is detected. CBF (<30%) Volume: 70mL-some of this mapping may be overestimated as there is likely some bleeding into the chronic infarct the lateral frontal lobe. Perfusion (Tmax>6.0s) volume: 64mL Mismatch Volume: 56 with mismatched ratio of 2.75mL Infarction Location:Left posterior frontal These results were communicated to Dr. Lorraine Lax at [place] 10/28/2020by text page via the [facility] messaging system. Embolectomy procedure has already been ordered. IMPRESSION: 1. 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 There has been a remote moderate left lateral frontal and upper insular infarct; CT perfusion maps show 38 cc of completed acute infarct and 56 cc of superimposed penumbra. 2. Limited atherosclerosis for age. No underlying flow limiting stenosis or embolic source identified. Electronically Signed   By: Monte Fantasia M.D.   On: 09/11/2019 07:27   Ct Head Code Stroke Wo Contrast  Result Date: 09/11/2019 CLINICAL DATA:  Code stroke.  Generalized muscle weakness EXAM: CT HEAD WITHOUT CONTRAST TECHNIQUE: Contiguous axial images were obtained from the base of the skull through the vertex without intravenous contrast. COMPARISON:  Brain MRI 09/08/2017 FINDINGS: Brain: No evidence of acute  infarction, hemorrhage, hydrocephalus, extra-axial collection or mass lesion/mass  effect. Moderate remote left MCA branch infarct affecting the lateral frontal lobe. Moderate remote right occipital infarct. Extensive chronic small vessel ischemic gliosis in the cerebral white matter. Small remote right cerebellar infarct Vascular: Hyperdense left distal M1 segment extending into the M2 branches. Skull: Normal. Negative for fracture or focal lesion. Sinuses/Orbits: No acute finding. Other: These results were communicated to Dr. Lorraine Lax at 6:34 amon 10/28/2020by text page via the [facility] messaging system. ASPECTS [facility] Stroke Program Early CT Score) Not scored given the extent of chronic change. IMPRESSION: 1. Hyperdense distal left M1 segment suggesting acute thrombosis. No hemorrhage or visible acute infarct. There has been a remote moderate left MCA branch infarct at the lateral frontal lobe. 2. Advanced chronic small vessel ischemia in the cerebral white matter. There is also been remote right occipital infarct. Electronically Signed   By: Monte Fantasia M.D.   On: 09/11/2019 06:36     Transthoracic Echocardiogram  00/00/2020 Pending No results found for this or any previous visit (from the past 43800 hour(s)).  ECG - SR rate 70 BPM. (See cardiology reading for complete details)  PHYSICAL EXAM Blood pressure (!) 112/56, pulse 71, temperature (!) 97 F (36.1 C), resp. rate 15, SpO2 95 %.  General: Obese elderly Caucasian lady who is agitated but not in distress Psych: Affect appropriate to situation Eyes: No scleral injection HENT: No OP obstrucion Head: Normocephalic.  Cardiovascular: Normal rate and regular rhythm. Respiratory: Effort normal and breath sounds normal to anterior ascultation GI: Soft. No distension. There is no tenderness.  Skin: WDI   Neurological Examination Mental Status: Alert, nonverbal with expressive aphasia and not following any commands. Cranial Nerves: II:  Visual fields : Right homonymous hemianopsia.  Blinks to threat on the left but on the right III,IV, VI: ptosis not present, left gaze deviation but crosses midline pupils equal, round, reactive to light and accommodation VII: Right facial droop XII: midline tongue extension Motor: Right :  Upper extremity   1/5                                      Left:     Upper extremity   5/5             Lower extremity   2/5                                                  Lower extremity   5/5 Tone and bulk:normal tone throughout; no atrophy noted Sensory: Reduced sensation to light touch on the right side Plantars: Right: downgoing                                Left: downgoing Cerebellar: No gross ataxia noted on the left side  HOME MEDICATIONS:  (Not in a hospital admission)   HOSPITAL MEDICATIONS:  .  stroke: mapping our early stages of recovery book   Does not apply Once  . nitroGLYCERIN      . sodium chloride flush  3 mL Intravenous Once  . ticagrelor        ALLERGIES Allergies  Allergen Reactions  . Amoxicillin-Pot Clavulanate Diarrhea    GI INTOLERANCE ONLY WITH DIARRHEA   ASSESSMENT/PLAN Ms.  April Le is a 78 y.o. female with history of significant for congestive heart failure(chronic diastolic heart failure), prior stroke, hypertension, hyperlipidemia and prior stroke with mild left leg weakness and mild aphasia" emergency department as a code stroke for left gaze deviation, right-sided weakness and aphasia secondary to left MCA stroke.  Acute left MCA cardieoemboic stroke, due to left M1 occlusion s/p mechanical thrombectomy with TICI 3 revascularization  Resultant  Right side hemiplegia, aphasia, dysphagia  Code Stroke CT Head - ASPECTS: Not scored given the extent of chronic changes.  CT head   MRI head- pending  MRA head - not done  CTA H&N - left M1 occlusion and CT perfusion showed a   CT Perfusion- 38 cc core with 56 cc penumbra.  Carotid Doppler - not  needed  2D Echo - pending  Sars Corona Virus 2  neg  LDL -pending  HgbA1c - No results found for requested labs within last 26280 hours.  UDS not done  VTE prophylaxis - SCDs Diet  Diet Order            Diet NPO time specified  Diet effective now               ASA 81mg  + Plavix  prior to admission, now post thrombectomy, holding  Patient counseled to be compliant with her antithrombotic medications  Ongoing aggressive stroke risk factor management  Therapy recommendations:  pending  Disposition:  Pending  Hypertension  Home BP meds: Apresoline, lasix, lopressor  Current BP meds: Clevoprex  Stable . Permissive hypertension (OK if < 180/120) but gradually normalize in 5-7 days . Long-term BP goal normotensive  Hyperlipidemia  Home Lipid lowering medication: Lipitor 40  LDL pending, goal < 70  Current lipid lowering medication: Lipitor 40 mg daily to be ordered once route avail  Continue statin at discharge  Diabetes  Home diabetic meds:none  Current diabetic meds:none  HgbA1c pending, goal < 7.0 Recent Labs    09/11/19 0623  GLUCAP 141*     Other Stroke Risk Factors  Advanced age  Hx stroke/TIA  Congestive Heart Failure  Other Active Problems  Agitation, encephalopathy- d/t above. monitoring  Hospital day # 0  Desiree Metzger-Cihelka, ARNP-C, ANVP-BC Pager: [phone] I have personally obtained history,examined this patient, reviewed notes, independently viewed imaging studies, participated in medical decision making and plan of care.ROS completed by me personally and pertinent positives fully documented  I have made any additions or clarifications directly to the above note. Agree with note above.  She presented with aphasia and right hemiplegia due to left MCA occlusion and underwent successful mechanical thrombectomy.  Recommend admit to neurological intensive care unit for close neurological monitoring.  Strict blood pressure  control as per post interventional protocol.  Check MRI scan of the brain later, echocardiogram, lipid profile hemoglobin A1c.  No family available at the bedside for discussion.This patient is critically ill and at significant risk of neurological worsening, death and care requires constant monitoring of vital signs, hemodynamics,respiratory and cardiac monitoring, extensive review of multiple databases, frequent neurological assessment, discussion with family, other specialists and medical decision making of high complexity.I have made any additions or clarifications directly to the above note.This critical care time does not reflect procedure time, or teaching time or supervisory time of PA/NP/Med Resident etc but could involve care discussion time.  I spent 30 minutes of neurocritical care time  in the care of  this patient.      Antony Contras, MD Medical  Director [facility] Pager: [phone] 09/11/2019 5:31 PM  To contact Stroke Continuity provider, please refer to [email or web address] After hours, contact General Neurology

## 2019-09-11 NOTE — Progress Notes (Signed)
Echo not yet performed due to patient not being in a room. April Le

## 2019-09-11 NOTE — ED Triage Notes (Signed)
Pt BIB [facility] from home. Pt husband reports waking at 0530 this am and reports pt having right facial droop, right sided weakness and inability to communicate. Pt LKW 2300 last night.

## 2019-09-11 NOTE — Anesthesia Procedure Notes (Addendum)
Procedure Name: Intubation Date/Time: 09/11/2019 7:54 AM Performed by: Marsa Aris, CRNA Pre-anesthesia Checklist: Patient identified, Emergency Drugs available, Suction available and Patient being monitored Patient Re-evaluated:Patient Re-evaluated prior to induction Oxygen Delivery Method: Circle System Utilized Preoxygenation: Pre-oxygenation with 100% oxygen Induction Type: IV induction Laryngoscope Size: Glidescope and 3 Grade View: Grade I Tube type: Oral Tube size: 7.0 mm Number of attempts: 1 Airway Equipment and Method: Stylet Placement Confirmation: ETT inserted through vocal cords under direct vision,  breath sounds checked- equal and bilateral and CO2 detector Secured at: 20 cm Tube secured with: Tape Dental Injury: Teeth and Oropharynx as per pre-operative assessment

## 2019-09-12 ENCOUNTER — Inpatient Hospital Stay (HOSPITAL_COMMUNITY): Payer: Medicare Other

## 2019-09-12 ENCOUNTER — Encounter (HOSPITAL_COMMUNITY): Payer: Self-pay | Admitting: *Deleted

## 2019-09-12 DIAGNOSIS — I63512 Cerebral infarction due to unspecified occlusion or stenosis of left middle cerebral artery: Secondary | ICD-10-CM

## 2019-09-12 LAB — BASIC METABOLIC PANEL
Anion gap: 10 (ref 5–15)
BUN: 16 mg/dL (ref 8–23)
CO2: 20 mmol/L — ABNORMAL LOW (ref 22–32)
Calcium: 8.6 mg/dL — ABNORMAL LOW (ref 8.9–10.3)
Chloride: 112 mmol/L — ABNORMAL HIGH (ref 98–111)
Creatinine, Ser: 1.05 mg/dL — ABNORMAL HIGH (ref 0.44–1.00)
GFR calc Af Amer: 58 mL/min — ABNORMAL LOW (ref >60)
GFR calc non Af Amer: 50 mL/min — ABNORMAL LOW (ref >60)
Glucose, Bld: 137 mg/dL — ABNORMAL HIGH (ref 70–99)
Potassium: 3.8 mmol/L (ref 3.5–5.1)
Sodium: 142 mmol/L (ref 135–145)

## 2019-09-12 LAB — CBC WITH DIFFERENTIAL/PLATELET
Abs Immature Granulocytes: 0.05 10*3/uL (ref 0.00–0.07)
Basophils Absolute: 0.1 10*3/uL (ref 0.0–0.1)
Basophils Relative: 1 %
Eosinophils Absolute: 0.1 10*3/uL (ref 0.0–0.5)
Eosinophils Relative: 1 %
HCT: 33.1 % — ABNORMAL LOW (ref 36.0–46.0)
Hemoglobin: 11.1 g/dL — ABNORMAL LOW (ref 12.0–15.0)
Immature Granulocytes: 1 %
Lymphocytes Relative: 6 %
Lymphs Abs: 0.6 10*3/uL — ABNORMAL LOW (ref 0.7–4.0)
MCH: 31 pg (ref 26.0–34.0)
MCHC: 33.5 g/dL (ref 30.0–36.0)
MCV: 92.5 fL (ref 80.0–100.0)
Monocytes Absolute: 1 10*3/uL (ref 0.1–1.0)
Monocytes Relative: 9 %
Neutro Abs: 8.8 10*3/uL — ABNORMAL HIGH (ref 1.7–7.7)
Neutrophils Relative %: 82 %
Platelets: 140 10*3/uL — ABNORMAL LOW (ref 150–400)
RBC: 3.58 MIL/uL — ABNORMAL LOW (ref 3.87–5.11)
RDW: 12.9 % (ref 11.5–15.5)
WBC: 10.6 10*3/uL — ABNORMAL HIGH (ref 4.0–10.5)
nRBC: 0 % (ref 0.0–0.2)

## 2019-09-12 LAB — LIPID PANEL
Cholesterol: 116 mg/dL (ref 0–200)
HDL: 39 mg/dL — ABNORMAL LOW (ref >40)
LDL Cholesterol: 43 mg/dL (ref 0–99)
Total CHOL/HDL Ratio: 3 RATIO
Triglycerides: 169 mg/dL — ABNORMAL HIGH (ref <150)
VLDL: 34 mg/dL (ref 0–40)

## 2019-09-12 LAB — ECHOCARDIOGRAM LIMITED

## 2019-09-12 LAB — HEMOGLOBIN A1C
Hgb A1c MFr Bld: 6.7 % — ABNORMAL HIGH (ref 4.8–5.6)
Mean Plasma Glucose: 145.59 mg/dL

## 2019-09-12 LAB — GLUCOSE, CAPILLARY: Glucose-Capillary: 135 mg/dL — ABNORMAL HIGH (ref 70–99)

## 2019-09-12 LAB — MRSA PCR SCREENING: MRSA by PCR: NEGATIVE

## 2019-09-12 MED ORDER — CLEVIDIPINE BUTYRATE 0.5 MG/ML IV EMUL
0.0000 mg/h | INTRAVENOUS | Status: DC
  Administered 2019-09-12 (×2): 21 mg/h via INTRAVENOUS
  Administered 2019-09-12: 4 mg/h via INTRAVENOUS

## 2019-09-12 MED ORDER — ASPIRIN 300 MG RE SUPP
300.0000 mg | Freq: Every day | RECTAL | Status: DC
  Administered 2019-09-12: 300 mg via RECTAL
  Filled 2019-09-12 (×2): qty 1

## 2019-09-12 MED ORDER — ENOXAPARIN SODIUM 40 MG/0.4ML ~~LOC~~ SOLN
40.0000 mg | SUBCUTANEOUS | Status: DC
  Administered 2019-09-12 – 2019-09-17 (×6): 40 mg via SUBCUTANEOUS
  Filled 2019-09-12 (×6): qty 0.4

## 2019-09-12 NOTE — Progress Notes (Signed)
Referring Physician(s): Dr. Leonie Man  Supervising Physician: Luanne Bras  Patient Status:  [facility] - In-pt  Chief Complaint: L MCA CVA  Subjective: Patient sitting up in bed working with ST this AM.  RN at bedside states she is making slow improvement.  Speech is intelligble, however does not always answer questions or follow commands.   Allergies: Amoxicillin-pot clavulanate  Medications: Prior to Admission medications   Medication Sig Start Date End Date Taking? Authorizing Provider  acetaminophen (TYLENOL) 500 MG tablet Take 500 mg by mouth every 6 (six) hours as needed for mild pain.   Yes [provider]  aspirin 81 MG tablet Take 81 mg by mouth daily with breakfast.    Yes [provider]  atorvastatin (LIPITOR) 40 MG tablet Take 40 mg by mouth at bedtime.    Yes [provider]  clopidogrel (PLAVIX) 75 MG tablet Take 75 mg by mouth daily.    Yes [provider]  donepezil (ARICEPT) 5 MG tablet Take 5 mg by mouth daily. 07/13/19  Yes [provider]  febuxostat (ULORIC) 40 MG tablet Take 40 mg by mouth daily.    Yes [provider]  furosemide (LASIX) 40 MG tablet Take 1 tablet (40 mg total) by mouth 2 (two) times daily. 01/15/19  Yes Richardo Priest, MD  hydrALAZINE (APRESOLINE) 25 MG tablet Take 0.5 tablet (12.5 mg) twice daily if your systolic blood pressure (top number) is consistently greater than 140 Patient taking differently: Take 25 mg by mouth 2 (two) times daily. Take 0.5 tablet (12.5 mg) twice daily if your systolic blood pressure (top number) is consistently greater than 140 08/22/19  Yes Munley, Hilton Cork, MD  levothyroxine (SYNTHROID, LEVOTHROID) 50 MCG tablet Take 50 mcg by mouth daily at 6 (six) AM.    Yes [provider]  metoprolol (LOPRESSOR) 50 MG tablet Take 50 mg by mouth 2 (two) times daily.    Yes [provider]  Multiple Vitamins-Minerals (MULTI-VITAMIN GUMMIES PO) Take 1 tablet by  mouth daily.   Yes [provider]  pantoprazole (PROTONIX) 40 MG tablet Take 40 mg by mouth daily.    Yes [provider]  potassium chloride SA (K-DUR,KLOR-CON) 20 MEQ tablet Take 20 mEq by mouth daily.    Yes [provider]     Vital Signs: BP (!) 153/54    Pulse (!) 102    Temp 98.7 F (37.1 C) (Oral)    Resp (!) 22    SpO2 96%   Physical Exam  NAD, alert Neuro:  Tongue midline, speech intelligible but slurred, answers "I don't know" for several questions, easily distracted.  RUE strength 3/5, RLE 2/5.  Moving left extremities.  Groin: soft, no evidence of pseudoaneurysm or hematoma.   Imaging: Ct Code Stroke Cta Head W/wo Contrast  Result Date: 09/11/2019 CLINICAL DATA:  Stroke symptoms EXAM: CT ANGIOGRAPHY HEAD AND NECK CT PERFUSION BRAIN TECHNIQUE: Multidetector CT imaging of the head and neck was performed using the standard protocol during bolus administration of intravenous contrast. Multiplanar CT image reconstructions and MIPs were obtained to evaluate the vascular anatomy. Carotid stenosis measurements (when applicable) are obtained utilizing NASCET criteria, using the distal internal carotid diameter as the denominator. Multiphase CT imaging of the brain was performed following IV bolus contrast injection. Subsequent parametric perfusion maps were calculated using RAPID software. CONTRAST:  146mL OMNIPAQUE IOHEXOL 350 MG/ML SOLN COMPARISON:  Head CT from earlier today. FINDINGS: Delayed study relative to the noncontrast CT  due to difficulty with IV access per report. CTA NECK FINDINGS Aortic arch: Atherosclerotic calcification.  Three vessel branching. Right carotid system: Limited atheromatous changes. No stenosis or ulceration. Left carotid system: Limited atheromatous changes for age. No stenosis or ulceration. Vertebral arteries: Proximal subclavian atherosclerosis is mild. Widely patent vertebral arteries with smooth luminal contour. Skeleton: No  acute finding.  Cervical spine degeneration. Other neck: No significant incidental finding Upper chest: Negative Review of the MIP images confirms the above findings CTA HEAD FINDINGS Anterior circulation: Central branching filling defect at the left distal M1 and proximal M2 segments with underfilling of downstream vessels. No right-sided embolism or branch occlusion is seen. Hypoplastic left A1 segment. Atherosclerotic plaque on both carotid siphons mild to moderate narrowing on the right. Posterior circulation: Vertebrobasilar arteries are smooth and widely patent. No branch occlusion or beading. Negative for aneurysm Venous sinuses: Unremarkable in the arterial phase Anatomic variants: As above Review of the MIP images confirms the above findings CT Brain Perfusion Findings: ASPECTS: Not scored given the extent of chronic changes. No acute infarct is detected. CBF (<30%) Volume: 83mL-some of this mapping may be overestimated as there is likely some bleeding into the chronic infarct the lateral frontal lobe. Perfusion (Tmax>6.0s) volume: 23mL Mismatch Volume: 56 with mismatched ratio of 2.33mL Infarction Location:Left posterior frontal These results were communicated to Dr. Lorraine Lax at [place] 10/28/2020by text page via the [facility] messaging system. Embolectomy procedure has already been ordered. IMPRESSION: 1. 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 There has been a remote moderate left lateral frontal and upper insular infarct; CT perfusion maps show 38 cc of completed acute infarct and 56 cc of superimposed penumbra. 2. Limited atherosclerosis for age. No underlying flow limiting stenosis or embolic source identified. Electronically Signed   By: Monte Fantasia M.D.   On: 09/11/2019 07:27   Ct Code Stroke Cta Neck W/wo Contrast  Result Date: 09/11/2019 CLINICAL DATA:  Stroke symptoms EXAM: CT ANGIOGRAPHY HEAD AND NECK CT PERFUSION BRAIN TECHNIQUE:  Multidetector CT imaging of the head and neck was performed using the standard protocol during bolus administration of intravenous contrast. Multiplanar CT image reconstructions and MIPs were obtained to evaluate the vascular anatomy. Carotid stenosis measurements (when applicable) are obtained utilizing NASCET criteria, using the distal internal carotid diameter as the denominator. Multiphase CT imaging of the brain was performed following IV bolus contrast injection. Subsequent parametric perfusion maps were calculated using RAPID software. CONTRAST:  172mL OMNIPAQUE IOHEXOL 350 MG/ML SOLN COMPARISON:  Head CT from earlier today. FINDINGS: Delayed study relative to the noncontrast CT due to difficulty with IV access per report. CTA NECK FINDINGS Aortic arch: Atherosclerotic calcification.  Three vessel branching. Right carotid system: Limited atheromatous changes. No stenosis or ulceration. Left carotid system: Limited atheromatous changes for age. No stenosis or ulceration. Vertebral arteries: Proximal subclavian atherosclerosis is mild. Widely patent vertebral arteries with smooth luminal contour. Skeleton: No acute finding.  Cervical spine degeneration. Other neck: No significant incidental finding Upper chest: Negative Review of the MIP images confirms the above findings CTA HEAD FINDINGS Anterior circulation: Central branching filling defect at the left distal M1 and proximal M2 segments with underfilling of downstream vessels. No right-sided embolism or branch occlusion is seen. Hypoplastic left A1 segment. Atherosclerotic plaque on both carotid siphons mild to moderate narrowing on the right. Posterior circulation: Vertebrobasilar arteries are smooth and widely patent. No branch occlusion or beading. Negative for aneurysm Venous sinuses: Unremarkable in  the arterial phase Anatomic variants: As above Review of the MIP images confirms the above findings CT Brain Perfusion Findings: ASPECTS: Not scored given  the extent of chronic changes. No acute infarct is detected. CBF (<30%) Volume: 71mL-some of this mapping may be overestimated as there is likely some bleeding into the chronic infarct the lateral frontal lobe. Perfusion (Tmax>6.0s) volume: 77mL Mismatch Volume: 56 with mismatched ratio of 2.56mL Infarction Location:Left posterior frontal These results were communicated to Dr. Lorraine Lax at [place] 10/28/2020by text page via the [facility] messaging system. Embolectomy procedure has already been ordered. IMPRESSION: 1. 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 There has been a remote moderate left lateral frontal and upper insular infarct; CT perfusion maps show 38 cc of completed acute infarct and 56 cc of superimposed penumbra. 2. Limited atherosclerosis for age. No underlying flow limiting stenosis or embolic source identified. Electronically Signed   By: Monte Fantasia M.D.   On: 09/11/2019 07:27   Mr Brain Wo Contrast  Result Date: 09/12/2019 CLINICAL DATA:  Mild aphasia. EXAM: MRI HEAD WITHOUT CONTRAST TECHNIQUE: Multiplanar, multiecho pulse sequences of the brain and surrounding structures were obtained without intravenous contrast. COMPARISON:  Code stroke CT from yesterday FINDINGS: Brain: Restricted diffusion throughout the large majority of the left MCA distribution cortex, with sparing at the margins including along the upper motor strip and in the superficial left temporal lobe. Tiny acute cortical infarct seen in the parasagittal right frontal lobe, ACA territory. Petechial hemorrhage is present along the left cerebral infarct. Moderate remote left MCA branch infarct affecting the lateral frontal lobe and upper insula. Remote right occipital infarct. Small remote right cerebellar infarct. Ischemic gliosis is confluent in the cerebral white matter. Age congruent volume loss. Vascular: Normal flow voids Skull and upper cervical spine: Negative for  marrow lesion Sinuses/Orbits: Right cataract resection. Mild mucosal thickening in the paranasal sinuses. Partial opacification of the right mastoid air cells. IMPRESSION: 1. Acute cortical infarct involving the majority of the left MCA distribution, sparing the upper perirolandic cortex and the superficial left temporal lobe. There is patchy petechial hemorrhage. 2. 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 Extensive chronic small vessel ischemia. Electronically Signed   By: Monte Fantasia M.D.   On: 09/12/2019 07:06   Ct Code Stroke Cta Cerebral Perfusion W/wo Contrast  Result Date: 09/11/2019 CLINICAL DATA:  Stroke symptoms EXAM: CT ANGIOGRAPHY HEAD AND NECK CT PERFUSION BRAIN TECHNIQUE: Multidetector CT imaging of the head and neck was performed using the standard protocol during bolus administration of intravenous contrast. Multiplanar CT image reconstructions and MIPs were obtained to evaluate the vascular anatomy. Carotid stenosis measurements (when applicable) are obtained utilizing NASCET criteria, using the distal internal carotid diameter as the denominator. Multiphase CT imaging of the brain was performed following IV bolus contrast injection. Subsequent parametric perfusion maps were calculated using RAPID software. CONTRAST:  165mL OMNIPAQUE IOHEXOL 350 MG/ML SOLN COMPARISON:  Head CT from earlier today. FINDINGS: Delayed study relative to the noncontrast CT due to difficulty with IV access per report. CTA NECK FINDINGS Aortic arch: Atherosclerotic calcification.  Three vessel branching. Right carotid system: Limited atheromatous changes. No stenosis or ulceration. Left carotid system: Limited atheromatous changes for age. No stenosis or ulceration. Vertebral arteries: Proximal subclavian atherosclerosis is mild. Widely patent vertebral arteries with smooth luminal contour. Skeleton: No acute finding.  Cervical spine degeneration. Other neck: No significant incidental finding Upper chest:  Negative Review of the MIP images  confirms the above findings CTA HEAD FINDINGS Anterior circulation: Central branching filling defect at the left distal M1 and proximal M2 segments with underfilling of downstream vessels. No right-sided embolism or branch occlusion is seen. Hypoplastic left A1 segment. Atherosclerotic plaque on both carotid siphons mild to moderate narrowing on the right. Posterior circulation: Vertebrobasilar arteries are smooth and widely patent. No branch occlusion or beading. Negative for aneurysm Venous sinuses: Unremarkable in the arterial phase Anatomic variants: As above Review of the MIP images confirms the above findings CT Brain Perfusion Findings: ASPECTS: Not scored given the extent of chronic changes. No acute infarct is detected. CBF (<30%) Volume: 96mL-some of this mapping may be overestimated as there is likely some bleeding into the chronic infarct the lateral frontal lobe. Perfusion (Tmax>6.0s) volume: 11mL Mismatch Volume: 56 with mismatched ratio of 2.53mL Infarction Location:Left posterior frontal These results were communicated to Dr. Lorraine Lax at [place] 10/28/2020by text page via the [facility] messaging system. Embolectomy procedure has already been ordered. IMPRESSION: 1. 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 There has been a remote moderate left lateral frontal and upper insular infarct; CT perfusion maps show 38 cc of completed acute infarct and 56 cc of superimposed penumbra. 2. Limited atherosclerosis for age. No underlying flow limiting stenosis or embolic source identified. Electronically Signed   By: Monte Fantasia M.D.   On: 09/11/2019 07:27   Ct Head Code Stroke Wo Contrast  Result Date: 09/11/2019 CLINICAL DATA:  Code stroke.  Generalized muscle weakness EXAM: CT HEAD WITHOUT CONTRAST TECHNIQUE: Contiguous axial images were obtained from the base of the skull through the vertex without intravenous  contrast. COMPARISON:  Brain MRI 09/08/2017 FINDINGS: Brain: No evidence of acute infarction, hemorrhage, hydrocephalus, extra-axial collection or mass lesion/mass effect. Moderate remote left MCA branch infarct affecting the lateral frontal lobe. Moderate remote right occipital infarct. Extensive chronic small vessel ischemic gliosis in the cerebral white matter. Small remote right cerebellar infarct Vascular: Hyperdense left distal M1 segment extending into the M2 branches. Skull: Normal. Negative for fracture or focal lesion. Sinuses/Orbits: No acute finding. Other: These results were communicated to Dr. Lorraine Lax at 6:34 amon 10/28/2020by text page via the [facility] messaging system. ASPECTS [facility] Stroke Program Early CT Score) Not scored given the extent of chronic change. IMPRESSION: 1. Hyperdense distal left M1 segment suggesting acute thrombosis. No hemorrhage or visible acute infarct. There has been a remote moderate left MCA branch infarct at the lateral frontal lobe. 2. Advanced chronic small vessel ischemia in the cerebral white matter. There is also been remote right occipital infarct. Electronically Signed   By: Monte Fantasia M.D.   On: 09/11/2019 06:36    Labs:  CBC: Recent Labs    09/11/19 0633 09/11/19 0817 09/12/19 0507  WBC  --  10.7* 10.6*  HGB 12.9 11.8* 11.1*  HCT 38.0 35.8* 33.1*  PLT  --  220 140*    COAGS: Recent Labs    09/11/19 0615  INR 1.0  APTT 23*    BMP: Recent Labs    07/18/19 1624 09/11/19 0633 09/11/19 0817 09/12/19 0507  NA 144 141 139 142  K 4.5 4.1 3.9 3.8  CL 106 108 105 112*  CO2 20  --  23 20*  GLUCOSE 87 147* 158* 137*  BUN 25 35* 30* 16  CALCIUM 9.5  --  8.8* 8.6*  CREATININE 1.26* 1.20* 1.25* 1.05*  GFRNONAA 41*  --  41* 50*  GFRAA 47*  --  47* 58*    LIVER FUNCTION TESTS: Recent Labs    09/11/19 0817  BILITOT 0.8  AST 22  ALT 16  ALKPHOS 77  PROT 5.7*  ALBUMIN 3.0*    Assessment and Plan: 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  Patient sitting up in bed this AM.  Extubated.  Alert Attempts to participate in conversation, but with limited ability due to dysarthria, comprehension, distractability.  SLP at bedside for swallow study.  Groin soft.   NIR following.    Electronically Signed: Docia Barrier, PA 09/12/2019, 11:58 AM   I spent a total of 15 Minutes at the the patient's bedside AND on the patient's hospital floor or unit, greater than 50% of which was counseling/coordinating care for L MCA CVA.

## 2019-09-12 NOTE — Progress Notes (Signed)
STROKE TEAM PROGRESS NOTE   INTERVAL HISTORY Her daughter is at the bedside. Pt is lying in the bed.  She is aphasic with little to no speech output. Able to follow 1 step commands, difficulty with 2 steps. Bruised LEs from recent fall.  She presented with left MCA occlusion and underwent successful mechanical thrombectomy but has significant aphasia and right hemiparesis.  MRI scan shows almost complete left MCA territory cortical infarct with slight hemorrhagic transformation.  Blood pressure has been adequately controlled overnight.  No significant changes.  OBJECTIVE Vitals:   09/12/19 0530 09/12/19 0600 09/12/19 0700 09/12/19 0800  BP: 126/89 140/90 (!) 132/58 125/72  Pulse: 88 76 90 94  Resp:   14 (!) 23  Temp:    (P) 98.7 F (37.1 C)  TempSrc:      SpO2: 96% 99% 98% 92%    CBC:  Recent Labs  Lab 09/11/19 0817 09/12/19 0507  WBC 10.7* 10.6*  NEUTROABS 8.8* 8.8*  HGB 11.8* 11.1*  HCT 35.8* 33.1*  MCV 93.0 92.5  PLT 220 140*    Basic Metabolic Panel:  Recent Labs  Lab 09/11/19 0817 09/12/19 0507  NA 139 142  K 3.9 3.8  CL 105 112*  CO2 23 20*  GLUCOSE 158* 137*  BUN 30* 16  CREATININE 1.25* 1.05*  CALCIUM 8.8* 8.6*    Lipid Panel:     Component Value Date/Time   CHOL 116 09/12/2019 0507   TRIG 169 (H) 09/12/2019 0507   HDL 39 (L) 09/12/2019 0507   CHOLHDL 3.0 09/12/2019 0507   VLDL 34 09/12/2019 0507   LDLCALC 43 09/12/2019 0507   HgbA1c:  Lab Results  Component Value Date   HGBA1C 6.7 (H) 09/12/2019   Urine Drug Screen: No results found for: LABOPIA, COCAINSCRNUR, LABBENZ, AMPHETMU, THCU, LABBARB  Alcohol Level No results found for: ETH  IMAGING Ct Code Stroke Cta Head W/wo Contrast  Result Date: 09/11/2019 CLINICAL DATA:  Stroke symptoms EXAM: CT ANGIOGRAPHY HEAD AND NECK CT PERFUSION BRAIN TECHNIQUE: Multidetector CT imaging of the head and neck was performed using the standard protocol during bolus administration of intravenous contrast.  Multiplanar CT image reconstructions and MIPs were obtained to evaluate the vascular anatomy. Carotid stenosis measurements (when applicable) are obtained utilizing NASCET criteria, using the distal internal carotid diameter as the denominator. Multiphase CT imaging of the brain was performed following IV bolus contrast injection. Subsequent parametric perfusion maps were calculated using RAPID software. CONTRAST:  171mL OMNIPAQUE IOHEXOL 350 MG/ML SOLN COMPARISON:  Head CT from earlier today. FINDINGS: Delayed study relative to the noncontrast CT due to difficulty with IV access per report. CTA NECK FINDINGS Aortic arch: Atherosclerotic calcification.  Three vessel branching. Right carotid system: Limited atheromatous changes. No stenosis or ulceration. Left carotid system: Limited atheromatous changes for age. No stenosis or ulceration. Vertebral arteries: Proximal subclavian atherosclerosis is mild. Widely patent vertebral arteries with smooth luminal contour. Skeleton: No acute finding.  Cervical spine degeneration. Other neck: No significant incidental finding Upper chest: Negative Review of the MIP images confirms the above findings CTA HEAD FINDINGS Anterior circulation: Central branching filling defect at the left distal M1 and proximal M2 segments with underfilling of downstream vessels. No right-sided embolism or branch occlusion is seen. Hypoplastic left A1 segment. Atherosclerotic plaque on both carotid siphons mild to moderate narrowing on the right. Posterior circulation: Vertebrobasilar arteries are smooth and widely patent. No branch occlusion or beading. Negative for aneurysm Venous sinuses: Unremarkable in the arterial phase Anatomic  variants: As above Review of the MIP images confirms the above findings CT Brain Perfusion Findings: ASPECTS: Not scored given the extent of chronic changes. No acute infarct is detected. CBF (<30%) Volume: 58mL-some of this mapping may be overestimated as there is  likely some bleeding into the chronic infarct the lateral frontal lobe. Perfusion (Tmax>6.0s) volume: 32mL Mismatch Volume: 56 with mismatched ratio of 2.60mL Infarction Location:Left posterior frontal These results were communicated to Dr. Lorraine Lax at [place] 10/28/2020by text page via the [facility] messaging system. Embolectomy procedure has already been ordered. IMPRESSION: 1. 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 There has been a remote moderate left lateral frontal and upper insular infarct; CT perfusion maps show 38 cc of completed acute infarct and 56 cc of superimposed penumbra. 2. Limited atherosclerosis for age. No underlying flow limiting stenosis or embolic source identified. Electronically Signed   By: Monte Fantasia M.D.   On: 09/11/2019 07:27   Ct Code Stroke Cta Neck W/wo Contrast  Result Date: 09/11/2019 CLINICAL DATA:  Stroke symptoms EXAM: CT ANGIOGRAPHY HEAD AND NECK CT PERFUSION BRAIN TECHNIQUE: Multidetector CT imaging of the head and neck was performed using the standard protocol during bolus administration of intravenous contrast. Multiplanar CT image reconstructions and MIPs were obtained to evaluate the vascular anatomy. Carotid stenosis measurements (when applicable) are obtained utilizing NASCET criteria, using the distal internal carotid diameter as the denominator. Multiphase CT imaging of the brain was performed following IV bolus contrast injection. Subsequent parametric perfusion maps were calculated using RAPID software. CONTRAST:  155mL OMNIPAQUE IOHEXOL 350 MG/ML SOLN COMPARISON:  Head CT from earlier today. FINDINGS: Delayed study relative to the noncontrast CT due to difficulty with IV access per report. CTA NECK FINDINGS Aortic arch: Atherosclerotic calcification.  Three vessel branching. Right carotid system: Limited atheromatous changes. No stenosis or ulceration. Left carotid system: Limited atheromatous changes  for age. No stenosis or ulceration. Vertebral arteries: Proximal subclavian atherosclerosis is mild. Widely patent vertebral arteries with smooth luminal contour. Skeleton: No acute finding.  Cervical spine degeneration. Other neck: No significant incidental finding Upper chest: Negative Review of the MIP images confirms the above findings CTA HEAD FINDINGS Anterior circulation: Central branching filling defect at the left distal M1 and proximal M2 segments with underfilling of downstream vessels. No right-sided embolism or branch occlusion is seen. Hypoplastic left A1 segment. Atherosclerotic plaque on both carotid siphons mild to moderate narrowing on the right. Posterior circulation: Vertebrobasilar arteries are smooth and widely patent. No branch occlusion or beading. Negative for aneurysm Venous sinuses: Unremarkable in the arterial phase Anatomic variants: As above Review of the MIP images confirms the above findings CT Brain Perfusion Findings: ASPECTS: Not scored given the extent of chronic changes. No acute infarct is detected. CBF (<30%) Volume: 43mL-some of this mapping may be overestimated as there is likely some bleeding into the chronic infarct the lateral frontal lobe. Perfusion (Tmax>6.0s) volume: 64mL Mismatch Volume: 56 with mismatched ratio of 2.6mL Infarction Location:Left posterior frontal These results were communicated to Dr. Lorraine Lax at [place] 10/28/2020by text page via the [facility] messaging system. Embolectomy procedure has already been ordered. IMPRESSION: 1. 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 There has been a remote moderate left lateral frontal and upper insular infarct; CT perfusion maps show 38 cc of completed acute infarct and 56 cc of superimposed penumbra. 2. Limited atherosclerosis for age. No underlying flow limiting stenosis or embolic  source identified. Electronically Signed   By: Monte Fantasia M.D.   On:  09/11/2019 07:27   Mr Brain Wo Contrast  Result Date: 09/12/2019 CLINICAL DATA:  Mild aphasia. EXAM: MRI HEAD WITHOUT CONTRAST TECHNIQUE: Multiplanar, multiecho pulse sequences of the brain and surrounding structures were obtained without intravenous contrast. COMPARISON:  Code stroke CT from yesterday FINDINGS: Brain: Restricted diffusion throughout the large majority of the left MCA distribution cortex, with sparing at the margins including along the upper motor strip and in the superficial left temporal lobe. Tiny acute cortical infarct seen in the parasagittal right frontal lobe, ACA territory. Petechial hemorrhage is present along the left cerebral infarct. Moderate remote left MCA branch infarct affecting the lateral frontal lobe and upper insula. Remote right occipital infarct. Small remote right cerebellar infarct. Ischemic gliosis is confluent in the cerebral white matter. Age congruent volume loss. Vascular: Normal flow voids Skull and upper cervical spine: Negative for marrow lesion Sinuses/Orbits: Right cataract resection. Mild mucosal thickening in the paranasal sinuses. Partial opacification of the right mastoid air cells. IMPRESSION: 1. Acute cortical infarct involving the majority of the left MCA distribution, sparing the upper perirolandic cortex and the superficial left temporal lobe. There is patchy petechial hemorrhage. 2. 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 Extensive chronic small vessel ischemia. Electronically Signed   By: Monte Fantasia M.D.   On: 09/12/2019 07:06   Ct Code Stroke Cta Cerebral Perfusion W/wo Contrast  Result Date: 09/11/2019 CLINICAL DATA:  Stroke symptoms EXAM: CT ANGIOGRAPHY HEAD AND NECK CT PERFUSION BRAIN TECHNIQUE: Multidetector CT imaging of the head and neck was performed using the standard protocol during bolus administration of intravenous contrast. Multiplanar CT image reconstructions and MIPs were obtained to evaluate the vascular anatomy.  Carotid stenosis measurements (when applicable) are obtained utilizing NASCET criteria, using the distal internal carotid diameter as the denominator. Multiphase CT imaging of the brain was performed following IV bolus contrast injection. Subsequent parametric perfusion maps were calculated using RAPID software. CONTRAST:  136mL OMNIPAQUE IOHEXOL 350 MG/ML SOLN COMPARISON:  Head CT from earlier today. FINDINGS: Delayed study relative to the noncontrast CT due to difficulty with IV access per report. CTA NECK FINDINGS Aortic arch: Atherosclerotic calcification.  Three vessel branching. Right carotid system: Limited atheromatous changes. No stenosis or ulceration. Left carotid system: Limited atheromatous changes for age. No stenosis or ulceration. Vertebral arteries: Proximal subclavian atherosclerosis is mild. Widely patent vertebral arteries with smooth luminal contour. Skeleton: No acute finding.  Cervical spine degeneration. Other neck: No significant incidental finding Upper chest: Negative Review of the MIP images confirms the above findings CTA HEAD FINDINGS Anterior circulation: Central branching filling defect at the left distal M1 and proximal M2 segments with underfilling of downstream vessels. No right-sided embolism or branch occlusion is seen. Hypoplastic left A1 segment. Atherosclerotic plaque on both carotid siphons mild to moderate narrowing on the right. Posterior circulation: Vertebrobasilar arteries are smooth and widely patent. No branch occlusion or beading. Negative for aneurysm Venous sinuses: Unremarkable in the arterial phase Anatomic variants: As above Review of the MIP images confirms the above findings CT Brain Perfusion Findings: ASPECTS: Not scored given the extent of chronic changes. No acute infarct is detected. CBF (<30%) Volume: 25mL-some of this mapping may be overestimated as there is likely some bleeding into the chronic infarct the lateral frontal lobe. Perfusion (Tmax>6.0s)  volume: 43mL Mismatch Volume: 56 with mismatched ratio of 2.108mL Infarction Location:Left posterior frontal These results were communicated to Dr. Lorraine Lax at  7:25 amon 10/28/2020by text page via the [facility] messaging system. Embolectomy procedure has already been ordered. IMPRESSION: 1. 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 There has been a remote moderate left lateral frontal and upper insular infarct; CT perfusion maps show 38 cc of completed acute infarct and 56 cc of superimposed penumbra. 2. Limited atherosclerosis for age. No underlying flow limiting stenosis or embolic source identified. Electronically Signed   By: Monte Fantasia M.D.   On: 09/11/2019 07:27   Ct Head Code Stroke Wo Contrast  Result Date: 09/11/2019 CLINICAL DATA:  Code stroke.  Generalized muscle weakness EXAM: CT HEAD WITHOUT CONTRAST TECHNIQUE: Contiguous axial images were obtained from the base of the skull through the vertex without intravenous contrast. COMPARISON:  Brain MRI 09/08/2017 FINDINGS: Brain: No evidence of acute infarction, hemorrhage, hydrocephalus, extra-axial collection or mass lesion/mass effect. Moderate remote left MCA branch infarct affecting the lateral frontal lobe. Moderate remote right occipital infarct. Extensive chronic small vessel ischemic gliosis in the cerebral white matter. Small remote right cerebellar infarct Vascular: Hyperdense left distal M1 segment extending into the M2 branches. Skull: Normal. Negative for fracture or focal lesion. Sinuses/Orbits: No acute finding. Other: These results were communicated to Dr. Lorraine Lax at 6:34 amon 10/28/2020by text page via the [facility] messaging system. ASPECTS [facility] Stroke Program Early CT Score) Not scored given the extent of chronic change. IMPRESSION: 1. Hyperdense distal left M1 segment suggesting acute thrombosis. No hemorrhage or visible acute infarct. There has been a remote moderate left MCA  branch infarct at the lateral frontal lobe. 2. Advanced chronic small vessel ischemia in the cerebral white matter. There is also been remote right occipital infarct. Electronically Signed   By: Monte Fantasia M.D.   On: 09/11/2019 06:36   Cerebral Angio S/P Lt common carotid arteriogram followed by complete revascularization of occluded Lt LMCA M 1 seg with x 1 pass with the embotrap 85mm x 23mm retriver device and x 1 pass with the solitaire 31mmx 40 mmX retriever device achieving a TICI 3 revascularization.  Transthoracic Echocardiogram  00/00/2020 Pending No results found for this or any previous visit (from the past 43800 hour(s)).    ECG - SR rate 70 BPM. (See cardiology reading for complete details)  PHYSICAL EXAM  Blood pressure 125/72, pulse 94, temperature (P) 98.7 F (37.1 C), resp. rate (!) 23, SpO2 92 %. General: Obese elderly Caucasian lady who is agitated but not in distress Psych: Affect appropriate to situation Eyes: No scleral injection HENT: No OP obstrucion Head: Normocephalic.  Cardiovascular: Normal rate and regular rhythm. Respiratory: Effort normal and breath sounds normal to anterior ascultation GI: Soft. No distension. There is no tenderness.  Skin: WDI Neurological Examination Mental Status: Alert, nonverbal with expressive aphasia and not following any commands.  She is able to speak a few words and occasional short sentences with dysarthria.  She is unable to name repeat.  She can follow only occasional midline and commands to pantomime Cranial Nerves: II: Visual fields : Right homonymous hemianopsia.  Blinks to threat on the left but on the right III,IV, VI: ptosis not present, left gaze deviation but crosses midline pupils equal, round, reactive to light and accommodation VII: Right facial droop XII: midline tongue extension Motor: Right :  Upper extremity   1/5  Left:     Upper extremity   5/5             Lower  extremity   2/5                                                  Lower extremity   5/5 Tone and bulk:normal tone throughout; no atrophy noted Sensory: Reduced sensation to light touch on the right side Plantars: Right: downgoing                                Left: downgoing Cerebellar: No gross ataxia noted on the left side  ASSESSMENT/PLAN [name] is a 78 y.o. female with history of significant for congestive heart failure(chronic diastolic heart failure), prior stroke, hypertension, hyperlipidemia and prior stroke with mild left leg weakness and mild aphasia" emergency department as a code stroke for left gaze deviation, right-sided weakness and aphasia secondary to left MCA stroke. Out of the tPA window. Sent to IR for L M1 occlusion.   Stroke:  left MCA cardioemboic stroke, due to left M1 occlusion s/p mechanical thrombectomy with TICI 3 revascularization w/ resultant petechial hemorrhage   Code Stroke CT Head - hyperdense L M1. No acute infarct. Old L MCA infarct L lateral frontal lobe. Small vessel disease. Old R occipital infarct. ASPECTS: Not scored given the extent of chronic changes.  CTA H&N - left M1 occlusion into M2 branches. Old L lateral frontal lobe and upper insular infarct. Limited small vessel disease.  CT Perfusion- 38 cc core with 56 cc penumbra.  Cerebral angio occlusion L MCA M1 w/ TICI3 revascularization w/ embotrap and solitaire   Post IR CT No ICH or mass effect or shift.   MRI L MCA cortical infarct, patchy petechial hemorrhage. Old L MCA and R PCA infarcts, extensive small vessel disease.    2D Echo - pending    Lacey Jensen Virus 2  neg  LDL 43   HgbA1c 5.7   VTE prophylaxis - SCDs - change to Lovenox 40 mg sq daily     ASA 81mg  + Plavix  prior to admission,  post thrombectomy on no antithrombotics d/t petechial hemorrhage. Now ok to add aspirin suppository. Plan DAPT once can take PO or has alternative feeding option   Therapy  recommendations:  Pending. Ok to be OOB   Disposition:  Pending  Continue ICU level care while on BP drip until stable  Hypertension  Home BP meds: Apresoline, lasix, lopressor  Current BP meds: Cleviprex . Increase SBP goal to < 180 . Wean cleviprex . Add home meds once able to take pos    . Long-term BP goal normotensive  Hyperlipidemia  Home Lipid lowering medication: Lipitor 40  LDL 43, goal < 70  Current lipid lowering medication: Lipitor 40 mg daily to be ordered once route available  Continue statin at discharge  Dysphagia . Secondary to stroke . Failed bedside swallow - NPO . Speech on board . For MBSS today   Other Stroke Risk Factors  Advanced age  Hx stroke/TIA  2016 seen by [name] in the office - resultant gait d/o and dysarthria due to extensive small vessel disease, no acute infarct on MRI. On Aggrenox  08/2009 - pt reported stroke, details not available in  EPIC  02/2007 - pt reported stroke, details not available in EPIC  Congestive Heart Failure  Other Active Problems  Agitation, encephalopathy- d/t above. Monitoring  Leukocytosis 10.6  Acute blood loss anemia 12.9-11.1  Pulmonary fibrosis    Hospital day # 1  I have personally obtained history,examined this patient, reviewed notes, independently viewed imaging studies, participated in medical decision making and plan of care.ROS completed by me personally and pertinent positives fully documented  I have made any additions or clarifications directly to the above note.  Plan mobilize out of bed.  Physical occupational therapy and speech therapy eval's.  Swallow evaluation.  Long discussion with daughter at the bedside and answered questions.  Start rectal aspirin and if able to swallow may add Plavix.  Change blood pressure goal to systolic below [id number]. This patient is critically ill and at significant risk of neurological worsening, death and care requires constant monitoring of vital signs,  hemodynamics,respiratory and cardiac monitoring, extensive review of multiple databases, frequent neurological assessment, discussion with family, other specialists and medical decision making of high complexity.I have made any additions or clarifications directly to the above note.This critical care time does not reflect procedure time, or teaching time or supervisory time of PA/NP/Med Resident etc but could involve care discussion time.  I spent 30 minutes of neurocritical care time  in the care of  this patient.      Antony Contras, MD Medical Director [facility] Stroke Center Pager: [phone] 09/12/2019 4:41 PM   To contact Stroke Continuity provider, please refer to [email or web address] After hours, contact General Neurology

## 2019-09-12 NOTE — Evaluation (Signed)
Physical Therapy Evaluation Patient Details Name: April Le MRN: [id number] DOB: 11/27/1940 Today's Date: 09/12/2019   History of Present Illness  April Le is a 78 y.o. female with past medical history significant for congestive heart failure(chronic diastolic heart failure), prior stroke, hypertension, hyperlipidemia and prior stroke with residual mild left leg weakness and mild aphasia. She presented to the emergency department as a code stroke for left gaze deviation, right-sided weakness and aphasia on 09/11/19 when she was found non-verbal with right side weakness and facial droop. Stat CT head was obtained which showed no acute hemorrhage/infarct.  CT head did demonstrate old infarcts most notably left MCA stroke and R PCA stroke.  CT angiogram and CT perfusion showed left M1 occlusion. Intubated briefly for arteriogram and TICI revascularization.    Clinical Impression  Pt admitted with/for s/s of stroke.  Pt was revascularized and on evaluation is needing min/mod assist for basic mobility and gait.Marland Kitchen  Pt currently limited functionally due to the problems listed. ( See problems list.)   Pt will benefit from PT to maximize function and safety in order to get ready for next venue listed below.     Follow Up Recommendations CIR;Supervision/Assistance - 24 hour    Equipment Recommendations  Other (comment)    Recommendations for Other Services Rehab consult     Precautions / Restrictions Precautions Precautions: Fall      Mobility  Bed Mobility Overal bed mobility: Needs Assistance Bed Mobility: Supine to Sit     Supine to sit: Min assist;Mod assist     General bed mobility comments: mod bridging, min assist movement of extemities.  gesture in addition to [facility] for direction.  Transfers Overall transfer level: Needs assistance Equipment used: None;1 person hand held assist Transfers: Sit to/from Stand Sit to Stand: Min assist         General transfer comment: cues  for hand placement  Ambulation/Gait Ambulation/Gait assistance: Min assist Gait Distance (Feet): 6 Feet(to the chair) Assistive device: 1 person hand held assist Gait Pattern/deviations: Step-through pattern Gait velocity: slow   General Gait Details: mildly unsteady, short, low amplitude steps  Stairs            Wheelchair Mobility    Modified Rankin (Stroke Patients Only) Modified Rankin (Stroke Patients Only) Pre-Morbid Rankin Score: Moderate disability Modified Rankin: Moderately severe disability     Balance Overall balance assessment: Needs assistance   Sitting balance-Leahy Scale: Fair     Standing balance support: Single extremity supported;No upper extremity supported Standing balance-Leahy Scale: Poor Standing balance comment: needing minimal external stability assist                             Pertinent Vitals/Pain Pain Assessment: Faces Faces Pain Scale: No hurt Pain Intervention(s): Monitored during session    Home Living Family/patient expects to be discharged to:: Private residence Living Arrangements: Spouse/significant other Available Help at Discharge: Family;Available 24 hours/day;Other (Comment)(pt's dtr becoming primary caregiver lately) Type of Home: House Home Access: Level entry     Home Layout: Two level;Able to live on main level with bedroom/bathroom Home Equipment: Gilford Rile - 2 wheels;Cane - single point;Shower seat;Wheelchair - manual      Prior Function Level of Independence: Independent with assistive device(s)         Comments: pt takes more sponge baths than showers, but has been able to shower alone, go to bathroom without assist, brush teeth and donn clothes independently  Hand Dominance        Extremity/Trunk Assessment   Upper Extremity Assessment Upper Extremity Assessment: Defer to OT evaluation(moves against gravity bilaterally)    Lower Extremity Assessment Lower Extremity Assessment:  Overall WFL for tasks assessed;Difficult to assess due to impaired cognition(gross coordination decreased bilaterally)       Communication   Communication: Expressive difficulties;Receptive difficulties  Cognition Arousal/Alertness: Awake/alert;Lethargic Behavior During Therapy: Flat affect Overall Cognitive Status: Impaired/Different from baseline Area of Impairment: Attention;Following commands;Awareness                   Current Attention Level: Sustained   Following Commands: Follows one step commands inconsistently;Follows one step commands with increased time   Awareness: Intellectual          General Comments General comments (skin integrity, edema, etc.): VSS    Exercises     Assessment/Plan    PT Assessment Patient needs continued PT services  PT Problem List Decreased strength;Decreased activity tolerance;Decreased balance;Decreased mobility;Decreased safety awareness;Cardiopulmonary status limiting activity       PT Treatment Interventions Gait training;Functional mobility training;Therapeutic activities;Balance training;Neuromuscular re-education;Patient/family education;DME instruction;Stair training    PT Goals (Current goals can be found in the Care Plan section)  Acute Rehab PT Goals Patient Stated Goal: pt unable to participate due to aphasia PT Goal Formulation: Patient unable to participate in goal setting Time For Goal Achievement: 09/26/19 Potential to Achieve Goals: Fair    Frequency Min 4X/week   Barriers to discharge        Co-evaluation               AM-PAC PT "6 Clicks" Mobility  Outcome Measure Help needed turning from your back to your side while in a flat bed without using bedrails?: A Little Help needed moving from lying on your back to sitting on the side of a flat bed without using bedrails?: A Lot   Help needed standing up from a chair using your arms (e.g., wheelchair or bedside chair)?: A Little Help needed to  walk in hospital room?: A Little Help needed climbing 3-5 steps with a railing? : A Lot 6 Click Score: 13    End of Session   Activity Tolerance: Patient tolerated treatment well Patient left: in chair;with call bell/phone within reach;with family/visitor present Nurse Communication: Mobility status PT Visit Diagnosis: Unsteadiness on feet (R26.81);Other symptoms and signs involving the nervous system (R29.898);Difficulty in walking, not elsewhere classified (R26.2)    Time: [id number] PT Time Calculation (min) (ACUTE ONLY): 31 min   Charges:   PT Evaluation $PT Eval Moderate Complexity: 1 Mod PT Treatments $Therapeutic Activity: 8-22 mins        09/12/2019  Donnella Sham, PT Acute Rehabilitation Services [phone]  (pager) [phone]  (office)  Tessie Fass Stephan Draughn 09/12/2019, 5:13 PM

## 2019-09-12 NOTE — Evaluation (Signed)
Clinical/Bedside Swallow Evaluation Patient Details  Name: April Le MRN: [id number] Date of Birth: [birth year]  Today's Date: 09/12/2019 Time: SLP Start Time (ACUTE ONLY): [id number] SLP Stop Time (ACUTE ONLY): 0914 SLP Time Calculation (min) (ACUTE ONLY): 17 min  Past Medical History:  Past Medical History:  Diagnosis Date  . Abdominal distention    past two months  . Abnormality of gait 10/28/2015  . Aphasia due to acute stroke ([place])   . 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 Arthritis    right knee  . Benign essential HTN 11/19/2013  . CAP (community acquired pneumonia) 09/07/2013  . CHF (congestive heart failure) ([place])   . Chronic diastolic heart failure ([place]) 11/19/2013  . Chronic kidney disease    STAGE III KIDNEY DISEASE-PER PT--PT SEES DR. Risa Grill UROLOGIST  . Complication of anesthesia    PT REMEMBERS BREATHING PROBLEMS WAKING UP FROM KNEE SURGERY AT [facility] 2011 OR 2012  . Coronary artery calcification seen on CT scan 01/10/2017  . Fever of unknown origin 09/04/2013  . GERD (gastroesophageal reflux disease)   . Headache   . Headache(784.0)   . 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 Hypercholesteremia   . Hypertension   . Hypertensive heart disease with heart failure ([place]) 01/10/2017  . Hypothyroidism   . Hypoxia 09/04/2013  . Leukocytosis 09/04/2013  . Mass of ovary 10/26/2011  . OSA (obstructive sleep apnea) 11/19/2013  . Pneumonia   . Shortness of breath    WITH EXERTION  . Sinus drainage    PT STARTED ANTIBIOTIC 11/17/11 --IS HOARSE TODAY, SOME WHEEZING AND COUGH WITH YELLOW DRAINAGE  . SIRS (systemic inflammatory response syndrome) ([place]) 09/04/2013  . SOB (shortness of breath) 09/04/2013  . Stroke ([place]) 02/2007, 08/2009   2 total -RESIDUAL WEAKNESS LEFT LEG--AND APHASIA  . Stroke ([place]) 09/01/2017  . Stroke ([place]) 09/08/2017  . Tumor of ovary   . Urinary incontinence   . Urinary incontinence   . Weakness of  left leg    Mildly, post stroke   Past Surgical History:  Past Surgical History:  Procedure Laterality Date  . ABDOMINAL HYSTERECTOMY    . CHOLECYSTECTOMY    . LAPAROTOMY  11/22/2011   Procedure: EXPLORATORY LAPAROTOMY;  Surgeon: Imagene Gurney A. Alycia Rossetti, MD;  Location: [facility];  Service: Gynecology;  Laterality: N/A;  . MENISCUS REPAIR    . SALPINGOOPHORECTOMY  11/22/2011   Procedure: SALPINGO OOPHERECTOMY;  Surgeon: Imagene Gurney A. Alycia Rossetti, MD;  Location: [facility];  Service: Gynecology;  Laterality: Bilateral;  . TOE SURGERY    . TONSILLECTOMY    . TUBAL LIGATION     HPI:  April Le is a 78 y.o. female with past medical history significant for congestive heart failure(chronic diastolic heart failure), prior stroke, hypertension, hyperlipidemia and prior stroke with residual mild left leg weakness and mild aphasia. She presented to the emergency department as a code stroke for left gaze deviation, right-sided weakness and aphasia on 09/11/19 when she was found non-verbal with right side weakness and facial droop. Stat CT head was obtained which showed no acute hemorrhage/infarct.  CT head did demonstrate old infarcts most notably left MCA stroke and R PCA stroke.  CT angiogram and CT perfusion showed left M1 occlusion. Intubated briefly for arteriogram and TICI revascularization.     Assessment / Plan / Recommendation Clinical Impression  Pt presented alert observed with ice chips, thin via cup, and puree and produced s/sx of aspiration on all  trials. OME revealed mild-mod R facial weakness and WNL tongue protrusion, although unable to fully assess due to limited command following. Baseline congested respirations and oral movements similar to lingual sweeping present prior to PO trials. Ice chip presentation required multiple cues to close mouth when ice was in oral cavity, but once closed pts oral phase appears WNL followed by immediate cough. Thins via cup oral phase had anterior spillage with consecutive sips  resulting in multiple delayed, audible swallows and wet vocal quality. Verbal and tactile cues for throat clear ineffective. Puree produced multiple audible swallows, throat clear, and belching following (likely due to air swallowed during audible swallow). Pt not ready for diet upgrade or instrumental testing at this time, recommend continue NPO with meds via alternative means. SLP will continue to follow acutely for further PO trials and instrumental test when ready.  SLP Visit Diagnosis: Dysphagia, unspecified (R13.10)    Aspiration Risk  Severe aspiration risk    Diet Recommendation NPO   Medication Administration: Via alternative means    Other  Recommendations Oral Care Recommendations: Oral care QID;Staff/trained caregiver to provide oral care   Follow up Recommendations Inpatient Rehab      Frequency and Duration min 2x/week  2 weeks       Prognosis Prognosis for Safe Diet Advancement: Good Barriers to Reach Goals: Time post onset      Swallow Study   General Date of Onset: 09/11/19 HPI: April Le is a 78 y.o. female with past medical history significant for congestive heart failure(chronic diastolic heart failure), prior stroke, hypertension, hyperlipidemia and prior stroke with residual mild left leg weakness and mild aphasia. She presented to the emergency department as a code stroke for left gaze deviation, right-sided weakness and aphasia on 09/11/19 when she was found non-verbal with right side weakness and facial droop. Stat CT head was obtained which showed no acute hemorrhage/infarct.  CT head did demonstrate old infarcts most notably left MCA stroke and R PCA stroke.  CT angiogram and CT perfusion showed left M1 occlusion. Intubated briefly for arteriogram and TICI revascularization.   Type of Study: Bedside Swallow Evaluation Diet Prior to this Study: NPO Temperature Spikes Noted: No Respiratory Status: Nasal cannula History of Recent Intubation: Yes Length of  Intubations (days): 0 days Date extubated: 09/11/19 Behavior/Cognition: Alert;Cooperative;Pleasant mood;Distractible;Requires cueing Oral Cavity Assessment: Other (comment)(Small amount of brown-colored secretions labially only) Oral Care Completed by SLP: Yes Oral Cavity - Dentition: Adequate natural dentition Vision: (UTA- mitts) Self-Feeding Abilities: Total assist Patient Positioning: Upright in bed Baseline Vocal Quality: Normal Volitional Cough: Cognitively unable to elicit Volitional Swallow: Unable to elicit    Oral/Motor/Sensory Function Overall Oral Motor/Sensory Function: Mild impairment Facial ROM: Reduced right Facial Symmetry: Abnormal symmetry right Facial Strength: Reduced right Facial Sensation: (UTA) Lingual ROM: Other (Comment)(Can protrude tongue, did not follow further commands) Lingual Symmetry: (UTA) Lingual Strength: (UTA) Lingual Sensation: (UTA) Velum: (Unable to visualize, pt not responsive to commands) Mandible: Within Functional Limits   Ice Chips Ice chips: Impaired Presentation: Spoon Oral Phase Impairments: Poor awareness of bolus(Required cues to close mouth) Pharyngeal Phase Impairments: Suspected delayed Swallow;Cough - Immediate   Thin Liquid Thin Liquid: Impaired Presentation: Cup Oral Phase Impairments: Reduced labial seal Oral Phase Functional Implications: Right anterior spillage Pharyngeal  Phase Impairments: Suspected delayed Swallow;Wet Vocal Quality;Multiple swallows    Nectar Thick Nectar Thick Liquid: Not tested   Honey Thick Honey Thick Liquid: Not tested   Puree Puree: Impaired Presentation: Spoon Pharyngeal Phase Impairments: Multiple swallows;Throat  Clearing - Immediate;Wet Vocal Quality(Audible swallow)   Solid     Solid: Not tested      Demarcus Thielke 09/12/2019,12:07 PM

## 2019-09-12 NOTE — Evaluation (Signed)
Speech Language Pathology Evaluation Patient Details Name: April Le MRN: [id number] DOB: June 11, 1941 Today's Date: 09/12/2019 Time: [id number] SLP Time Calculation (min) (ACUTE ONLY): 17 min  Problem List:  Patient Active Problem List   Diagnosis Date Noted  . Acute ischemic left MCA stroke ([place]) 09/11/2019  . 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 Bilateral lower extremity edema 07/18/2019  . Weakness of left leg   . Urinary incontinence   . Tumor of ovary   . Stroke ([place])   . Sinus drainage   . Shortness of breath   . Pneumonia   . Hypothyroidism   . Hypertension   . Hypercholesteremia   . Headache   . GERD (gastroesophageal reflux disease)   . Complication of anesthesia   . CKD (chronic kidney disease), stage III   . Arthritis   . 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 Abdominal distention   . Coronary artery calcification seen on CT scan 01/10/2017  . Hypertensive heart disease with heart failure ([place]) 01/10/2017  . Abnormality of gait 10/28/2015  . 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 Chronic diastolic heart failure ([place]) 11/19/2013  . Benign essential HTN 11/19/2013  . OSA (obstructive sleep apnea) 11/19/2013  . CAP (community acquired pneumonia) 09/07/2013  . Leukocytosis 09/04/2013  . Fever of unknown origin 09/04/2013  . Hypoxia 09/04/2013  . SOB (shortness of breath) 09/04/2013  . SIRS (systemic inflammatory response syndrome) ([place]) 09/04/2013  . Mass of ovary 10/26/2011   Past Medical History:  Past Medical History:  Diagnosis Date  . Abdominal distention    past two months  . Abnormality of gait 10/28/2015  . 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 Arthritis    right knee  . Benign essential HTN 11/19/2013  . CAP (community acquired pneumonia) 09/07/2013  . CHF (congestive heart failure) ([place])   . Chronic diastolic heart failure ([place]) 11/19/2013  . Chronic  kidney disease    STAGE III KIDNEY DISEASE-PER PT--PT SEES DR. Risa Grill UROLOGIST  . Complication of anesthesia    PT REMEMBERS BREATHING PROBLEMS WAKING UP FROM KNEE SURGERY AT [facility] 2011 OR 2012  . Coronary artery calcification seen on CT scan 01/10/2017  . Fever of unknown origin 09/04/2013  . GERD (gastroesophageal reflux disease)   . Headache   . Headache(784.0)   . 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 Hypercholesteremia   . Hypertension   . Hypertensive heart disease with heart failure ([place]) 01/10/2017  . Hypothyroidism   . Hypoxia 09/04/2013  . Leukocytosis 09/04/2013  . Mass of ovary 10/26/2011  . OSA (obstructive sleep apnea) 11/19/2013  . Pneumonia   . Shortness of breath    WITH EXERTION  . Sinus drainage    PT STARTED ANTIBIOTIC 11/17/11 --IS HOARSE TODAY, SOME WHEEZING AND COUGH WITH YELLOW DRAINAGE  . SIRS (systemic inflammatory response syndrome) ([place]) 09/04/2013  . SOB (shortness of breath) 09/04/2013  . Stroke ([place]) 02/2007, 08/2009   2 total -RESIDUAL WEAKNESS LEFT LEG--AND APHASIA  . Stroke ([place]) 09/01/2017  . Stroke ([place]) 09/08/2017  . Tumor of ovary   . Urinary incontinence   . Urinary incontinence   . Weakness of left leg    Mildly, post stroke   Past Surgical History:  Past Surgical History:  Procedure Laterality Date  . ABDOMINAL HYSTERECTOMY    .  CHOLECYSTECTOMY    . LAPAROTOMY  11/22/2011   Procedure: EXPLORATORY LAPAROTOMY;  Surgeon: Imagene Gurney A. Alycia Rossetti, MD;  Location: [facility];  Service: Gynecology;  Laterality: N/A;  . MENISCUS REPAIR    . SALPINGOOPHORECTOMY  11/22/2011   Procedure: SALPINGO OOPHERECTOMY;  Surgeon: Imagene Gurney A. Alycia Rossetti, MD;  Location: [facility];  Service: Gynecology;  Laterality: Bilateral;  . TOE SURGERY    . TONSILLECTOMY    . TUBAL LIGATION     HPI:  April Le is a 78 y.o. female with past medical history significant for congestive heart failure(chronic diastolic heart  failure), prior stroke, hypertension, hyperlipidemia and prior stroke with residual mild left leg weakness and mild aphasia. She presented to the emergency department as a code stroke for left gaze deviation, right-sided weakness and aphasia on 09/11/19 when she was found non-verbal with right side weakness and facial droop. Stat CT head was obtained which showed no acute hemorrhage/infarct.  CT head did demonstrate old infarcts most notably left MCA stroke and R PCA stroke.  CT angiogram and CT perfusion showed left M1 occlusion. Intubated briefly for arteriogram and TICI revascularization.     Assessment / Plan / Recommendation Clinical Impression  Pt presents severely aphasic with largely unintelligible speech due to mod dysarthria decrease articulatory movement. Expressive and receptive language deficits present, suspect expressive > receptive. Dysfluency present in phrases marked by hesitations, phonemic paraphasias and decreased semantics with filler words. Her automatic social language remains intact, sentence completion more effective than open ended questions to receive a response. The Western Aphasia Battery bedside screen was administered with modifications for hand restraints. Biographical spontaneous speech prompts achieved 0/3. Auditory verbal comprehension achieved 6/10 accuracy Single word repetition achieved 0/3. Simple 2 step commands were not initiated when prompted 2x. Naming impaired, achieving 1/4 with perseveration of correct response on following targets. Automatic singing impaired in unison with therapist accurate for 2-3 letters.  SLP discussed pts previous history of stroke and aphasia with pt maintaining eye contact and head nodding however unable to provide information. Her sustained attention to therapist appeared functional, decreased awareness of language errors and difficulty initiating tasks. Pt requires continued SLP services for severe aphasia, dysarthria, and cognitive  deficits with recommendation for CIR.     SLP Assessment  SLP Recommendation/Assessment: Patient needs continued Speech Lanaguage Pathology Services SLP Visit Diagnosis: Cognitive communication deficit (R41.841);Aphasia (R47.01);Dysarthria and anarthria (R47.1)    Follow Up Recommendations  Inpatient Rehab    Frequency and Duration min 2x/week  2 weeks      SLP Evaluation Cognition  Overall Cognitive Status: Impaired/Different from baseline Arousal/Alertness: Awake/alert Orientation Level: Oriented to person;Disoriented to place;Disoriented to time;Disoriented to situation Attention: Sustained Sustained Attention: Appears intact Memory: Impaired Memory Impairment: Decreased recall of new information(Place) Awareness: Impaired Awareness Impairment: Intellectual impairment Problem Solving: (UTA) Executive Function: Initiating Initiating: Impaired Initiating Impairment: Verbal basic;Functional basic Safety/Judgment: Impaired       Comprehension  Auditory Comprehension Overall Auditory Comprehension: Impaired Yes/No Questions: Impaired Basic Biographical Questions: 26-50% accurate(2/4) Basic Immediate Environment Questions: 50-74% accurate(1/2) Complex Questions: 75-100% accurate(3/4) Commands: Impaired One Step Basic Commands: 0-24% accurate(~10%) Two Step Basic Commands: 0-24% accurate(0/1) Interfering Components: Motor planning EffectiveTechniques: Pausing;Repetition Visual Recognition/Discrimination Discrimination: Not tested Reading Comprehension Reading Status: Impaired Word level: Impaired Sentence Level: Impaired Paragraph Level: Not tested Functional Environmental (signs, name badge): Not tested Effective Techniques: Other (comment)(Verbal cues unsuccessful)    Expression Expression Primary Mode of Expression: Verbal Verbal Expression Overall Verbal Expression: Impaired Initiation: Impaired Automatic Speech: Name;Social Response  Level of  Generative/Spontaneous Verbalization: Phrase Repetition: Impaired(0/3) Level of Impairment: Word level Naming: Impairment Confrontation: Impaired(1/4) Verbal Errors: Neologisms;Perseveration Effective Techniques: (sentence completion, phonemic cues, semantic cues not effect) Written Expression Dominant Hand: (UTA) Written Expression: Unable to assess (comment)([place])   Oral / Motor  Oral Motor/Sensory Function Overall Oral Motor/Sensory Function: Mild impairment Facial ROM: Reduced right Facial Symmetry: Abnormal symmetry right Facial Strength: Reduced right Facial Sensation: (UTA) Lingual ROM: Other (Comment)(Can protrude tongue, did not follow further commands) Lingual Symmetry: (UTA) Lingual Strength: (UTA) Lingual Sensation: (UTA) Velum: (Unable to visualize, pt not responsive to commands) Mandible: Within Functional Limits Motor Speech Overall Motor Speech: Impaired Respiration: Within functional limits Phonation: Normal Resonance: Within functional limits Articulation: Impaired Level of Impairment: Word Intelligibility: Intelligibility reduced Word: 0-24% accurate Phrase: 0-24% accurate Sentence: 0-24% accurate Conversation: 0-24% accurate Motor Planning: Impaired Level of Impairment: Word Motor Speech Errors: Consistent Interfering Components: Anatomical limitations(R facial droop)   GO                    Breken Nazari 09/12/2019, 12:09 PM

## 2019-09-12 NOTE — Progress Notes (Signed)
Rehab Admissions Coordinator Note:  Per PT and SLP recommendation, this patient was screened by Raechel Ache for appropriateness for an Inpatient Acute Rehab Consult.  At this time, we are recommending Inpatient Rehab consult. AC will contact MD to request order.   Raechel Ache 09/12/2019, 5:45 PM  I can be reached at [phone].

## 2019-09-12 NOTE — Progress Notes (Signed)
  Echocardiogram 2D Echocardiogram limited has been performed. Started exam had to leave the exam for a emergecy, when I came back to finish the echo the patient was agitated and  wasn't able to complete the exam.    Hassie Bruce 09/12/2019, 10:41 AM

## 2019-09-13 LAB — GLUCOSE, CAPILLARY
Glucose-Capillary: 126 mg/dL — ABNORMAL HIGH (ref 70–99)
Glucose-Capillary: 150 mg/dL — ABNORMAL HIGH (ref 70–99)
Glucose-Capillary: 131 mg/dL — ABNORMAL HIGH (ref 70–99)

## 2019-09-13 MED ORDER — ASPIRIN 325 MG PO TABS
325.0000 mg | ORAL_TABLET | Freq: Every day | ORAL | Status: DC
  Administered 2019-09-13 – 2019-09-17 (×4): 325 mg
  Filled 2019-09-13 (×4): qty 1

## 2019-09-13 MED ORDER — JEVITY 1.2 CAL PO LIQD
1000.0000 mL | ORAL | Status: DC
  Filled 2019-09-13: qty 1000

## 2019-09-13 MED ORDER — CLOPIDOGREL BISULFATE 75 MG PO TABS
75.0000 mg | ORAL_TABLET | Freq: Every day | ORAL | Status: DC
  Administered 2019-09-14: 75 mg via ORAL
  Filled 2019-09-13: qty 1

## 2019-09-13 MED ORDER — JEVITY 1.2 CAL PO LIQD
1000.0000 mL | ORAL | Status: DC
  Administered 2019-09-13: 14:00:00
  Administered 2019-09-13 – 2019-09-15 (×4): 1000 mL
  Filled 2019-09-13 (×8): qty 1000

## 2019-09-13 MED ORDER — ATORVASTATIN CALCIUM 10 MG PO TABS
20.0000 mg | ORAL_TABLET | Freq: Every day | ORAL | Status: DC
  Administered 2019-09-14: 20 mg via ORAL
  Filled 2019-09-13: qty 2

## 2019-09-13 NOTE — Progress Notes (Signed)
Physical Therapy Treatment Patient Details Name: April Le MRN: [id number] DOB: November 20, 1940 Today's Date: 09/13/2019    History of Present Illness April Le is a 78 y.o. female with past medical history significant for congestive heart failure(chronic diastolic heart failure), prior stroke, hypertension, hyperlipidemia and prior stroke with residual mild left leg weakness and mild aphasia. She presented to the emergency department as a code stroke for left gaze deviation, right-sided weakness and aphasia on 09/11/19 when she was found non-verbal with right side weakness and facial droop. Stat CT head was obtained which showed no acute hemorrhage/infarct.  CT head did demonstrate old infarcts most notably left MCA stroke and R PCA stroke.  CT angiogram and CT perfusion showed left M1 occlusion. Intubated briefly for arteriogram and TICI revascularization.      PT Comments    Pt admitted with above diagnosis. Pt was able to ambulate with +2 mod assist with RW due to pt pushes the RW out too far in front of her.  Pt then ambulated with +2HHA and min to mod assist as she tends to get overbalanced at times.  Pt follows commands but does have difficulty communicating due to aphasia. Was able to communicate she needed to have BM.  Will follow acutely.  Pt currently with functional limitations due to the deficits listed below (see PT Problem List). Pt will benefit from skilled PT to increase their independence and safety with mobility to allow discharge to the venue listed below.     Follow Up Recommendations  CIR;Supervision/Assistance - 24 hour     Equipment Recommendations  Other (comment)    Recommendations for Other Services Rehab consult     Precautions / Restrictions Precautions Precautions: Fall Restrictions Weight Bearing Restrictions: No    Mobility  Bed Mobility               General bed mobility comments: in chair on arrival  Transfers Overall transfer level: Needs  assistance Equipment used: 1 person hand held assist;Rolling walker (2 wheeled) Transfers: Sit to/from Stand Sit to Stand: Min assist;Mod assist;+2 safety/equipment         General transfer comment: cues for hand placement, some assist for power up and steadying  Ambulation/Gait Ambulation/Gait assistance: Mod assist;+2 physical assistance Gait Distance (Feet): 80 Feet Assistive device: Rolling walker (2 wheeled);2 person hand held assist Gait Pattern/deviations: Step-through pattern;Staggering left;Staggering right;Leaning posteriorly;Wide base of support Gait velocity: slow Gait velocity interpretation: <1.31 ft/sec, indicative of household ambulator General Gait Details: Initially attempted ambulation with RW but pt needed a lot of cues as pt not staying close to RW and needed mod assit to keep pt from pushing RW too far out in front of her.  Finally decided to ambulate pt HHA of 2 and pt did a lot better with steps but still with shortened step length on the right LE at times.  Pt with  short, low amplitude steps overall.  Pt told PT she had to use the bathroom therefore we turned around and walked to bathroom and she did have BM.  Pt cleaned with total assist and then PT and tech walker her back to chair with +2 min to mod assist HHA.    Stairs             Wheelchair Mobility    Modified Rankin (Stroke Patients Only) Modified Rankin (Stroke Patients Only) Pre-Morbid Rankin Score: Moderate disability Modified Rankin: Moderately severe disability     Balance Overall balance assessment: Needs assistance Sitting-balance support: No  upper extremity supported;Feet supported Sitting balance-Leahy Scale: Fair     Standing balance support: Bilateral upper extremity supported;During functional activity Standing balance-Leahy Scale: Poor Standing balance comment: needing moderate external stability assistand bil UE support                            Cognition  Arousal/Alertness: Awake/alert;Lethargic Behavior During Therapy: Flat affect Overall Cognitive Status: Impaired/Different from baseline Area of Impairment: Attention;Following commands;Awareness                   Current Attention Level: Sustained   Following Commands: Follows one step commands inconsistently;Follows one step commands with increased time   Awareness: Intellectual          Exercises      General Comments General comments (skin integrity, edema, etc.): VSS      Pertinent Vitals/Pain Pain Assessment: No/denies pain Faces Pain Scale: No hurt    Home Living                      Prior Function            PT Goals (current goals can now be found in the care plan section) Acute Rehab PT Goals Patient Stated Goal: pt unable to participate due to aphasia Progress towards PT goals: Progressing toward goals    Frequency    Min 4X/week      PT Plan Current plan remains appropriate    Co-evaluation              AM-PAC PT "6 Clicks" Mobility   Outcome Measure  Help needed turning from your back to your side while in a flat bed without using bedrails?: A Little Help needed moving from lying on your back to sitting on the side of a flat bed without using bedrails?: A Lot Help needed moving to and from a bed to a chair (including a wheelchair)?: A Lot Help needed standing up from a chair using your arms (e.g., wheelchair or bedside chair)?: A Little Help needed to walk in hospital room?: A Lot Help needed climbing 3-5 steps with a railing? : A Lot 6 Click Score: 14    End of Session Equipment Utilized During Treatment: Gait belt Activity Tolerance: Patient tolerated treatment well Patient left: in chair;with call bell/phone within reach;with family/visitor present;with chair alarm set Nurse Communication: Mobility status PT Visit Diagnosis: Unsteadiness on feet (R26.81);Other symptoms and signs involving the nervous system  (R29.898);Difficulty in walking, not elsewhere classified (R26.2)     Time: [id number] PT Time Calculation (min) (ACUTE ONLY): 24 min  Charges:  $Gait Training: 23-37 mins                     [place] Pager:  [phone]  Office:  [place] 09/13/2019, 1:06 PM

## 2019-09-13 NOTE — Progress Notes (Signed)
Patient arrived to unit. Patient assessed and Vital signs taken, Telemetry verified. Oxygen Nasal Cannula applied. Nurse will continue to monitor. Gwendolyn Grant, RN

## 2019-09-13 NOTE — Procedures (Signed)
Cortrak  Person Inserting Tube:  [name], RD Tube Type:  Cortrak - 43 inches Tube Location:  Right nare Initial Placement:  Stomach Secured by: Bridle Technique Used to Measure Tube Placement:  Documented cm marking at nare/ corner of mouth Cortrak Secured At:  65 cm   No x-ray is required. RN may begin using tube.   If the tube becomes dislodged please keep the tube and contact the Cortrak team at [email or web address] (password [email or web address]) for replacement.  If after hours and replacement cannot be delayed, place a NG tube and confirm placement with an abdominal x-ray.   [name] RD, LDN Clinical Nutrition Pager # - [phone]    

## 2019-09-13 NOTE — Progress Notes (Signed)
STROKE TEAM PROGRESS NOTE   INTERVAL HISTORY Her dtr arrived just after rounds. Patient up in the chair. Failed swallow yesterday. Will place cortrak and start TF today. Off cleviprex. Stable for transfer to the floor. Etiology of stroke remains unclear.  Vital signs stable.  She will need feeding tube when she failed swallow eval and modified barium yesterday  OBJECTIVE Vitals:   09/13/19 0630 09/13/19 0700 09/13/19 0800 09/13/19 0900  BP: (!) 151/69 (!) 158/87 (!) 153/74 (!) 156/70  Pulse: 79 76 72 78  Resp: 19 18 20 16   Temp:  98.8 F (37.1 C)    TempSrc:  Oral    SpO2: 98% 100% 98% 99%    CBC:  Recent Labs  Lab 09/11/19 0817 09/12/19 0507  WBC 10.7* 10.6*  NEUTROABS 8.8* 8.8*  HGB 11.8* 11.1*  HCT 35.8* 33.1*  MCV 93.0 92.5  PLT 220 140*    Basic Metabolic Panel:  Recent Labs  Lab 09/11/19 0817 09/12/19 0507  NA 139 142  K 3.9 3.8  CL 105 112*  CO2 23 20*  GLUCOSE 158* 137*  BUN 30* 16  CREATININE 1.25* 1.05*  CALCIUM 8.8* 8.6*    Lipid Panel:     Component Value Date/Time   CHOL 116 09/12/2019 0507   TRIG 169 (H) 09/12/2019 0507   HDL 39 (L) 09/12/2019 0507   CHOLHDL 3.0 09/12/2019 0507   VLDL 34 09/12/2019 0507   LDLCALC 43 09/12/2019 0507   HgbA1c:  Lab Results  Component Value Date   HGBA1C 6.7 (H) 09/12/2019   Urine Drug Screen: No results found for: LABOPIA, COCAINSCRNUR, LABBENZ, AMPHETMU, THCU, LABBARB  Alcohol Level No results found for: [facility]  IMAGING Mr Brain Wo Contrast  Result Date: 09/12/2019 CLINICAL DATA:  Mild aphasia. EXAM: MRI HEAD WITHOUT CONTRAST TECHNIQUE: Multiplanar, multiecho pulse sequences of the brain and surrounding structures were obtained without intravenous contrast. COMPARISON:  Code stroke CT from yesterday FINDINGS: Brain: Restricted diffusion throughout the large majority of the left MCA distribution cortex, with sparing at the margins including along the upper motor strip and in the superficial left temporal  lobe. Tiny acute cortical infarct seen in the parasagittal right frontal lobe, ACA territory. Petechial hemorrhage is present along the left cerebral infarct. Moderate remote left MCA branch infarct affecting the lateral frontal lobe and upper insula. Remote right occipital infarct. Small remote right cerebellar infarct. Ischemic gliosis is confluent in the cerebral white matter. Age congruent volume loss. Vascular: Normal flow voids Skull and upper cervical spine: Negative for marrow lesion Sinuses/Orbits: Right cataract resection. Mild mucosal thickening in the paranasal sinuses. Partial opacification of the right mastoid air cells. IMPRESSION: 1. Acute cortical infarct involving the majority of the left MCA distribution, sparing the upper perirolandic cortex and the superficial left temporal lobe. There is patchy petechial hemorrhage. 2. 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 Extensive chronic small vessel ischemia. Electronically Signed   By: Monte Fantasia M.D.   On: 09/12/2019 07:06   Cerebral Angio S/P Lt common carotid arteriogram followed by complete revascularization of occluded Lt LMCA M 1 seg with x 1 pass with the embotrap 57mm x 66mm retriver device and x 1 pass with the solitaire 60mmx 40 mmX retriever device achieving a TICI 3 revascularization.  Transthoracic Echocardiogram  09/12/2019  1. The patient refused the complete study. Globally EF appears normal ~65%. RV functional appears globally normal. Unable to provide much more information than that on this limited study.  2. Left  ventricular ejection fraction, by visual estimation, is 60 to 65%. The left ventricle has normal function. There is mildly increased left ventricular hypertrophy.  3. Left ventricular diastolic parameters are consistent with Grade I diastolic dysfunction (impaired relaxation).  4. Global right ventricle has normal systolic function.The right ventricular size is normal. No increase in right ventricular wall  thickness.  5. Left atrial size was not assessed.  6. Right atrial size was not assessed.  7. Presence of pericardial fat pad.  8. Mild mitral annular calcification.  9. The mitral valve was not assessed. not assessed mitral valve regurgitation. 10. The tricuspid valve is not assessed. Tricuspid valve regurgitation not assessed. 11. The aortic valve was not assessed. Aortic valve regurgitation is mild. 12. The pulmonic valve was not assessed. Pulmonic valve regurgitation not assessed. 13. Aortic root could not be assessed. 14. The interatrial septum was not assessed.  ECG - SR rate 70 BPM. (See cardiology reading for complete details)  PHYSICAL EXAM   General: Obese elderly Caucasian lady who is agitated but not in distress Psych: Affect appropriate to situation Eyes: No scleral injection HENT: No OP obstrucion Head: Normocephalic.  Cardiovascular: Normal rate and regular rhythm. Respiratory: Effort normal and breath sounds normal to anterior ascultation GI: Soft. No distension. There is no tenderness.  Skin: WDI Neurological Examination Mental Status: Alert, awake l with expressive greater than receptive aphasia and   Following only pantomime commands.  She is able to speak a few words and occasional short sentences with dysarthria.  She is unable to name repeat.  She can follow only occasional midline and commands to pantomime Cranial Nerves: II: Visual fields : Right homonymous hemianopsia.  Blinks to threat on the left but on the right III,IV, VI: ptosis not present, left gaze deviation but crosses midline pupils equal, round, reactive to light and accommodation VII: Right facial droop XII: midline tongue extension Motor: Right :  Upper extremity   1/5                                      Left:     Upper extremity   5/5             Lower extremity   2/5                                                  Lower extremity   5/5 Tone and bulk:normal tone throughout; no atrophy  noted Sensory: Reduced sensation to light touch on the right side Plantars: Right: downgoing                                Left: downgoing Cerebellar: No gross ataxia noted on the left side  ASSESSMENT/PLAN Ms. Kenneth Daigler is a 78 y.o. female with history of significant for congestive heart failure(chronic diastolic heart failure), prior stroke, hypertension, hyperlipidemia and prior stroke with mild left leg weakness and mild aphasia" emergency department as a code stroke for left gaze deviation, right-sided weakness and aphasia secondary to left MCA stroke. Out of the tPA window. Sent to IR for L M1 occlusion.   Stroke:  left MCA cardioemboic stroke, due to left M1 occlusion s/p mechanical thrombectomy with  TICI 3 revascularization w/ resultant petechial hemorrhage. Embolic source unclear.   Code Stroke CT Head - hyperdense L M1. No acute infarct. Old L MCA infarct L lateral frontal lobe. Small vessel disease. Old R occipital infarct. ASPECTS: Not scored given the extent of chronic changes.  CTA H&N - left M1 occlusion into M2 branches. Old L lateral frontal lobe and upper insular infarct. Limited small vessel disease.  CT Perfusion- 38 cc core with 56 cc penumbra.  Cerebral angio occlusion L MCA M1 w/ TICI3 revascularization w/ embotrap and solitaire   Post IR CT No ICH or mass effect or shift.   MRI L MCA cortical infarct, patchy petechial hemorrhage. Old L MCA and R PCA infarcts, extensive small vessel disease.    2D Echo - EF 65%%. No source of embolus   Lacey Jensen Virus 2  neg  LDL 43   HgbA1c 5.7   VTE prophylaxis -  Lovenox 40 mg sq daily     ASA 81mg  + Plavix  prior to admission,  post thrombectomy on no antithrombotics d/t petechial hemorrhage. Now on aspirin suppository.  Plan DAPT once has alternative feeding option   Therapy recommendations:  CIR  Disposition:  Pending  Hypertension  Home BP meds: Apresoline, lasix, lopressor  Current BP meds:  Cleviprex . Increase SBP goal to < 180 . Wean cleviprex . Add home meds once able to take pos    . Long-term BP goal normotensive  Hyperlipidemia  Home Lipid lowering medication: Lipitor 40  LDL 43, goal < 70  Current lipid lowering medication: Lipitor 40 mg daily to be ordered once route available  Continue statin at discharge  Dysphagia . Secondary to stroke . Failed bedside swallow - NPO . Speech on board . Did not pass MBSS yesterday . Place cortrak . Start TF    Other Stroke Risk Factors  Advanced age  Hx stroke/TIA  2016 seen by [name] in the office - resultant gait d/o and dysarthria due to extensive small vessel disease, no acute infarct on MRI. On Aggrenox  08/2009 - pt reported stroke, details not available in [facility]  02/2007 - pt reported stroke, details not available in EPIC  Congestive Heart Failure  Other Active Problems  Agitation, encephalopathy- d/t above. Monitoring  Leukocytosis 10.6  Acute blood loss anemia 12.9-11.8-11.1  Pulmonary fibrosis    Hospital day # 2  I had a long discussion with the patient and her daughter at the bedside.  Recommend ongoing therapies.  Panda tube for tube feeding and dietitian consult.  Mobilize out of bed.  Rehab consult.  Transfer to neurology floor bed later today.  Greater than 50% time during this 35-minute visit was spent on counseling and coordination of care and discussion with care team.  Antony Contras, MD Medical Director [place] Pager: [phone] 09/13/2019 9:52 AM   To contact Stroke Continuity provider, please refer to [email or web address] After hours, contact General Neurology

## 2019-09-13 NOTE — Evaluation (Signed)
Occupational Therapy Evaluation Patient Details Name: April Le MRN: [id number] DOB: 04/25/1941 Today's Date: 09/13/2019    History of Present Illness April Le is a 78 y.o. female with past medical history significant for congestive heart failure(chronic diastolic heart failure), prior stroke, hypertension, hyperlipidemia and prior stroke with residual mild left leg weakness and mild aphasia. She presented to the emergency department as a code stroke for left gaze deviation, right-sided weakness and aphasia on 09/11/19 when she was found non-verbal with right side weakness and facial droop. Stat CT head was obtained which showed no acute hemorrhage/infarct.  CT head did demonstrate old infarcts most notably left MCA stroke and R PCA stroke.  CT angiogram and CT perfusion showed left M1 occlusion. Intubated briefly for arteriogram and TICI revascularization.     Clinical Impression   PT admitted with L M1 occlusion with TICI revasularization. Pt currently with functional limitiations due to the deficits listed below (see OT problem list). Pt currently with global aphasia and requires mod (A) for basic transfer. Pt needs mod to max cues to initiate task due to receptive deficits.  Pt will benefit from skilled OT to increase their independence and safety with adls and balance to allow discharge CIR.     Follow Up Recommendations  CIR    Equipment Recommendations  3 in 1 bedside commode;Other (comment)(RW)    Recommendations for Other Services Rehab consult     Precautions / Restrictions Precautions Precautions: Fall Restrictions Weight Bearing Restrictions: No      Mobility Bed Mobility Overal bed mobility: Needs Assistance Bed Mobility: Supine to Sit     Supine to sit: Min guard     General bed mobility comments: pt needed mod cues and visual cues by tapping side of bed and offering hand to initiate task. pt able to progress toward eob with cues  Transfers Overall  transfer level: Needs assistance Equipment used: 1 person hand held assist;Rolling walker (2 wheeled) Transfers: Sit to/from Stand Sit to Stand: Mod assist         General transfer comment: dgter present and helping move IV pole for therapist. pt needed hand over hand for placement on RW    Balance Overall balance assessment: Needs assistance Sitting-balance support: No upper extremity supported;Feet supported Sitting balance-Leahy Scale: Fair     Standing balance support: Bilateral upper extremity supported;During functional activity Standing balance-Leahy Scale: Poor Standing balance comment: requires BIL UE support                           ADL either performed or assessed with clinical judgement   ADL Overall ADL's : Needs assistance/impaired Eating/Feeding: NPO Eating/Feeding Details (indicate cue type and reason): pt currently with feeding tube- daughter with questions about deating again  Grooming: Wash/dry hands;Moderate assistance;Sitting   Upper Body Bathing: Moderate assistance;Sitting   Lower Body Bathing: Total assistance           Toilet Transfer: Moderate assistance;RW;Regular Toilet;Ambulation Toilet Transfer Details (indicate cue type and reason): pt walking toward bathroom with lose stool upon seeing the toilet. pt does not void on the commode. Pt with total (A) for lB hygiene. pt does lift R foot in response to seeing sock which is more automatic then other adl attempts this session.  Toileting- Clothing Manipulation and Hygiene: Total assistance       Functional mobility during ADLs: Moderate assistance;Rolling walker General ADL Comments: pt attempting to step around RW once. pt appropriate responses verbally  to therapist "thank you" Goodbye" in response to warm blanket and leaving the room     Vision   Additional Comments: tracking therapist but needs further evaluation in functional task     Perception     Praxis      Pertinent  Vitals/Pain Pain Assessment: No/denies pain Faces Pain Scale: No hurt     Hand Dominance Right   Extremity/Trunk Assessment Upper Extremity Assessment Upper Extremity Assessment: Overall WFL for tasks assessed   Lower Extremity Assessment Lower Extremity Assessment: Defer to PT evaluation   Cervical / Trunk Assessment Cervical / Trunk Assessment: Normal   Communication Communication Communication: Expressive difficulties;Receptive difficulties   Cognition Arousal/Alertness: Awake/alert;Lethargic Behavior During Therapy: Flat affect Overall Cognitive Status: Difficult to assess Area of Impairment: Attention;Following commands;Awareness;Memory                   Current Attention Level: Sustained Memory: Decreased recall of precautions Following Commands: Follows one step commands inconsistently;Follows one step commands with increased time   Awareness: Intellectual   General Comments: pt unable to accurately answer yes no questions, pt unable to follow 1 step commands. pt needs tactile auditory and visual cues to follow command like Show two fingers, thumbs up,. Pt appears to have more global communication deficits. daughter reports one previous stroke causing inability to communicate but resolving   General Comments  VSS, RA throughout session    Exercises     Shoulder Instructions      Home Living Family/patient expects to be discharged to:: Private residence Living Arrangements: Spouse/significant other Available Help at Discharge: Family;Available 24 hours/day;Other (Comment) Type of Home: House Home Access: Level entry     Home Layout: Two level;Able to live on main level with bedroom/bathroom Alternate Level Stairs-Number of Steps: flight Alternate Level Stairs-Rails: Right;Left Bathroom Shower/Tub: Occupational psychologist: Handicapped height Bathroom Accessibility: Yes   Home Equipment: Environmental consultant - 2 wheels;Cane - single point;Shower  seat;Wheelchair - manual   Additional Comments: daughter present at eval      Prior Functioning/Environment Level of Independence: Independent with assistive device(s)        Comments: pt takes more sponge baths than showers, but has been able to shower alone, go to bathroom without assist, brush teeth and donn clothes independently        OT Problem List: Decreased activity tolerance;Impaired balance (sitting and/or standing);Decreased cognition;Decreased safety awareness;Decreased knowledge of use of DME or AE;Decreased knowledge of precautions;Obesity      OT Treatment/Interventions: Self-care/ADL training;Therapeutic exercise;Neuromuscular education;Energy conservation;DME and/or AE instruction;Manual therapy;Therapeutic activities;Cognitive remediation/compensation;Patient/family education;Balance training    OT Goals(Current goals can be found in the care plan section) Acute Rehab OT Goals Patient Stated Goal: pt unable to participate due to aphasia OT Goal Formulation: With patient/family Time For Goal Achievement: 09/27/19 Potential to Achieve Goals: Good  OT Frequency: Min 2X/week   Barriers to D/C: Decreased caregiver support  son is looking for housing per daughter present. family is uncertain of d/c environment        Co-evaluation              AM-PAC OT "6 Clicks" Daily Activity     Outcome Measure Help from another person eating meals?: A Lot Help from another person taking care of personal grooming?: A Lot Help from another person toileting, which includes using toliet, bedpan, or urinal?: A Lot Help from another person bathing (including washing, rinsing, drying)?: A Lot Help from another person to put on and taking  off regular upper body clothing?: A Lot Help from another person to put on and taking off regular lower body clothing?: A Lot 6 Click Score: 12   End of Session Equipment Utilized During Treatment: Surveyor, mining Communication:  Mobility status;Precautions  Activity Tolerance: Patient tolerated treatment well Patient left: in chair;with call bell/phone within reach;with chair alarm set;with restraints reapplied;with family/visitor present  OT Visit Diagnosis: Unsteadiness on feet (R26.81);Muscle weakness (generalized) (M62.81)                Time: [id number] OT Time Calculation (min): 30 min Charges:  OT General Charges $OT Visit: 1 Visit OT Evaluation $OT Eval Moderate Complexity: 1 Mod OT Treatments $Self Care/Home Management : 8-22 mins   Brynn, OTR/L  Acute Rehabilitation Services Pager: [phone] Office: [phone] .   Jeri Modena 09/13/2019, 3:16 PM

## 2019-09-13 NOTE — Progress Notes (Signed)
Initial Nutrition Assessment  DOCUMENTATION CODES:   Obesity unspecified  INTERVENTION:   Initiate Jevity 1.2 @ 25 ml/hr and increase by 10 ml every 4 hours to goal rate of 55 ml/hr  Provides: 1584 kcal, 73 grams protein, and 1070 ml free water.    NUTRITION DIAGNOSIS:   Inadequate oral intake related to inability to eat as evidenced by NPO status.  GOAL:   Patient will meet greater than or equal to 90% of their needs  MONITOR:   TF tolerance, Diet advancement  REASON FOR ASSESSMENT:   Consult Enteral/tube feeding initiation and management  ASSESSMENT:   Pt with PMH of GERD, HTN, CKD stage III, hypothyroidism, CVA, and CHF now admitted with L MCA occlusion s/p mechanical thrombectomy with significant aphasia and R hemiparesis.   Pt aphasic and cannot provide any history at this time.  Pt discussed during ICU rounds and with RN.  Per chart review, weight stable.  Pt failed swallow eval, cortrak tube placed today, tip is gastric.   Medications reviewed Labs reviewed    NUTRITION - FOCUSED PHYSICAL EXAM:    Most Recent Value  Orbital Region  No depletion  Upper Arm Region  No depletion  Thoracic and Lumbar Region  No depletion  Buccal Region  No depletion  Temple Region  No depletion  Clavicle and Acromion Bone Region  No depletion  Scapular Bone Region  No depletion  Dorsal Hand  No depletion  Patellar Region  No depletion  Anterior Thigh Region  No depletion  Posterior Calf Region  No depletion  Edema (RD Assessment)  None  Hair  Reviewed  Eyes  Reviewed  Mouth  Reviewed  Skin  Reviewed  Nails  Reviewed       Diet Order:   Diet Order            Diet NPO time specified  Diet effective now              EDUCATION NEEDS:   No education needs have been identified at this time  Skin:  Skin Assessment: Reviewed RN Assessment  Last BM:  unknown  Height:   Ht Readings from Last 1 Encounters:  09/13/19 5' (1.524 m)    Weight:   Wt  Readings from Last 1 Encounters:  09/13/19 76.7 kg    Ideal Body Weight:  45.4 kg  BMI:  Body mass index is 33.02 kg/m.  Estimated Nutritional Needs:   Kcal:  1500-1700  Protein:  65-75 grams  Fluid:  > 1.5 L/day  Maylon Peppers RD, LDN, CNSC [phone] Pager [phone] After Hours Pager

## 2019-09-13 NOTE — Progress Notes (Signed)
  Speech Language Pathology Treatment: Dysphagia;Cognitive-Linquistic  Patient Details Name: April Le MRN: [id number] DOB: [birth year] Today's Date: 09/13/2019 Time: [id number] SLP Time Calculation (min) (ACUTE ONLY): 23 min  Assessment / Plan / Recommendation Clinical Impression  Pt was seen for skilled ST targeting goals for dysphagia and communication.  Pt was asleep upon therapist's arrival but awakened easily to voice, touch, and cool compress.  Therapist facilitated the session with trials of ice chips to continue working towards initiation of PO diet.  Pt initially demonstrated a disorganized and atypical acceptance and chewing pattern for ice chips; however, as trials progressed the automaticity of pt's oral phase improved markedly.  Pt did demonstrate delayed coughing following trials; however, her daughter reports that pt has a chronic cough at baseline.  Continue to recommend NPO with temporary alternative means of nutrition with ongoing PO trials with SLP.  Pt was <25% accurate when singing in unison with therapist despite multiple attempts and max assist multimodal cues.  She did exhibit some appropriate social responses when therapist was introducing herself as well as departing.  Pt's verbal output is characterized by perseveration, echolalia, and jargon. Pt's daughter presented with questions regarding prognosis and SLP provided skilled education regarding sequelae of stroke recovery and realistic expectations for recovery given pt's current level of function and history of multiple CVAs.  All questions were answered to pt's daughter's satisfaction at this time.  Pt was left in recliner with family and nursing at bedside.  Continue per current plan of care.    HPI HPI: April Le is a 78 y.o. female with past medical history significant for congestive heart failure(chronic diastolic heart failure), prior stroke, hypertension, hyperlipidemia and prior stroke with residual mild left leg  weakness and mild aphasia. She presented to the emergency department as a code stroke for left gaze deviation, right-sided weakness and aphasia on 09/11/19 when she was found non-verbal with right side weakness and facial droop. Stat CT head was obtained which showed no acute hemorrhage/infarct.  CT head did demonstrate old infarcts most notably left MCA stroke and R PCA stroke.  CT angiogram and CT perfusion showed left M1 occlusion. Intubated briefly for arteriogram and TICI revascularization.        SLP Plan  Continue with current plan of care       Recommendations  Diet recommendations: NPO Medication Administration: Via alternative means                Oral Care Recommendations: Oral care QID;Staff/trained caregiver to provide oral care Follow up Recommendations: Inpatient Rehab SLP Visit Diagnosis: Dysphagia, unspecified (R13.10) Plan: Continue with current plan of care       GO                Danity Schmelzer, Selinda Orion 09/13/2019, 3:25 PM

## 2019-09-13 NOTE — Consult Note (Signed)
Inpatient Rehabilitation Admissions Coordinator  Inpatient rehab consult recieved . I met with patient with her daughter at bedside. We discussed goals an expectations of na inpt rehab admit. Daughter prefers [facility] for pt has been to [facility] as well as SNF for rehab during previous CVAs, but prefers [facility]. I will follow her progress over the weekend and begin insurance authorization Monday for a possible admit pending medical work up completion.  Danne Baxter, RN, MSN Rehab Admissions Coordinator [phone] 09/13/2019 12:20 PM

## 2019-09-14 ENCOUNTER — Inpatient Hospital Stay (HOSPITAL_COMMUNITY): Payer: Medicare Other

## 2019-09-14 DIAGNOSIS — Z8673 Personal history of transient ischemic attack (TIA), and cerebral infarction without residual deficits: Secondary | ICD-10-CM

## 2019-09-14 DIAGNOSIS — I6602 Occlusion and stenosis of left middle cerebral artery: Secondary | ICD-10-CM

## 2019-09-14 DIAGNOSIS — I639 Cerebral infarction, unspecified: Secondary | ICD-10-CM | POA: Diagnosis not present

## 2019-09-14 DIAGNOSIS — E785 Hyperlipidemia, unspecified: Secondary | ICD-10-CM

## 2019-09-14 DIAGNOSIS — I10 Essential (primary) hypertension: Secondary | ICD-10-CM

## 2019-09-14 DIAGNOSIS — R1312 Dysphagia, oropharyngeal phase: Secondary | ICD-10-CM

## 2019-09-14 LAB — GLUCOSE, CAPILLARY
Glucose-Capillary: 139 mg/dL — ABNORMAL HIGH (ref 70–99)
Glucose-Capillary: 140 mg/dL — ABNORMAL HIGH (ref 70–99)
Glucose-Capillary: 157 mg/dL — ABNORMAL HIGH (ref 70–99)
Glucose-Capillary: 163 mg/dL — ABNORMAL HIGH (ref 70–99)
Glucose-Capillary: 174 mg/dL — ABNORMAL HIGH (ref 70–99)
Glucose-Capillary: 180 mg/dL — ABNORMAL HIGH (ref 70–99)

## 2019-09-14 LAB — CBC
HCT: 30 % — ABNORMAL LOW (ref 36.0–46.0)
Hemoglobin: 10.2 g/dL — ABNORMAL LOW (ref 12.0–15.0)
MCH: 31.2 pg (ref 26.0–34.0)
MCHC: 34 g/dL (ref 30.0–36.0)
MCV: 91.7 fL (ref 80.0–100.0)
Platelets: 131 10*3/uL — ABNORMAL LOW (ref 150–400)
RBC: 3.27 MIL/uL — ABNORMAL LOW (ref 3.87–5.11)
RDW: 13.1 % (ref 11.5–15.5)
WBC: 7.8 10*3/uL (ref 4.0–10.5)
nRBC: 0 % (ref 0.0–0.2)

## 2019-09-14 LAB — BASIC METABOLIC PANEL
Anion gap: 9 (ref 5–15)
BUN: 10 mg/dL (ref 8–23)
CO2: 23 mmol/L (ref 22–32)
Calcium: 8.4 mg/dL — ABNORMAL LOW (ref 8.9–10.3)
Chloride: 112 mmol/L — ABNORMAL HIGH (ref 98–111)
Creatinine, Ser: 0.81 mg/dL (ref 0.44–1.00)
GFR calc Af Amer: >60 mL/min (ref >60)
GFR calc non Af Amer: >60 mL/min (ref >60)
Glucose, Bld: 172 mg/dL — ABNORMAL HIGH (ref 70–99)
Potassium: 3.5 mmol/L (ref 3.5–5.1)
Sodium: 144 mmol/L (ref 135–145)

## 2019-09-14 MED ORDER — HALOPERIDOL LACTATE 5 MG/ML IJ SOLN
1.0000 mg | Freq: Once | INTRAMUSCULAR | Status: AC
  Administered 2019-09-15: 1 mg via INTRAVENOUS
  Filled 2019-09-14: qty 1

## 2019-09-14 MED ORDER — HALOPERIDOL LACTATE 5 MG/ML IJ SOLN
1.0000 mg | Freq: Once | INTRAMUSCULAR | Status: AC
  Administered 2019-09-14: 1 mg via INTRAVENOUS
  Filled 2019-09-14: qty 1

## 2019-09-14 MED ORDER — METOPROLOL TARTRATE 50 MG PO TABS
50.0000 mg | ORAL_TABLET | Freq: Two times a day (BID) | ORAL | Status: DC
  Filled 2019-09-14: qty 1

## 2019-09-14 MED ORDER — METOPROLOL TARTRATE 50 MG PO TABS
50.0000 mg | ORAL_TABLET | Freq: Two times a day (BID) | ORAL | Status: DC
  Administered 2019-09-14 – 2019-09-17 (×4): 50 mg
  Filled 2019-09-14 (×3): qty 1

## 2019-09-14 MED ORDER — LEVOTHYROXINE SODIUM 50 MCG PO TABS: 50.0000 ug | ORAL_TABLET | Freq: Every day | ORAL | Status: DC

## 2019-09-14 MED ORDER — FUROSEMIDE 40 MG PO TABS
40.0000 mg | ORAL_TABLET | Freq: Two times a day (BID) | ORAL | Status: DC
  Filled 2019-09-14: qty 1

## 2019-09-14 MED ORDER — POTASSIUM CHLORIDE CRYS ER 20 MEQ PO TBCR
20.0000 meq | EXTENDED_RELEASE_TABLET | Freq: Every day | ORAL | Status: DC
  Administered 2019-09-14 – 2019-09-15 (×2): 20 meq via ORAL
  Filled 2019-09-14: qty 2
  Filled 2019-09-14: qty 1

## 2019-09-14 MED ORDER — HALOPERIDOL LACTATE 5 MG/ML IJ SOLN: 1.0000 mg | Freq: Four times a day (QID) | INTRAMUSCULAR | Status: DC | PRN

## 2019-09-14 MED ORDER — DONEPEZIL HCL 5 MG PO TABS
5.0000 mg | ORAL_TABLET | Freq: Every day | ORAL | Status: DC
  Administered 2019-09-14 – 2019-09-17 (×3): 5 mg
  Filled 2019-09-14 (×2): qty 1

## 2019-09-14 MED ORDER — POTASSIUM CHLORIDE CRYS ER 20 MEQ PO TBCR
20.0000 meq | EXTENDED_RELEASE_TABLET | Freq: Every day | ORAL | Status: DC
  Filled 2019-09-14: qty 1

## 2019-09-14 MED ORDER — DONEPEZIL HCL 5 MG PO TABS
5.0000 mg | ORAL_TABLET | Freq: Every day | ORAL | Status: DC
  Filled 2019-09-14: qty 1

## 2019-09-14 MED ORDER — FUROSEMIDE 40 MG PO TABS
40.0000 mg | ORAL_TABLET | Freq: Two times a day (BID) | ORAL | Status: DC
  Administered 2019-09-14 – 2019-09-15 (×2): 40 mg
  Filled 2019-09-14: qty 1

## 2019-09-14 MED ORDER — CLOPIDOGREL BISULFATE 75 MG PO TABS
75.0000 mg | ORAL_TABLET | Freq: Every day | ORAL | Status: DC
  Administered 2019-09-15 – 2019-09-17 (×2): 75 mg
  Filled 2019-09-14 (×2): qty 1

## 2019-09-14 MED ORDER — FREE WATER
200.0000 mL | Freq: Four times a day (QID) | Status: DC
  Administered 2019-09-14 – 2019-09-17 (×9): 200 mL

## 2019-09-14 MED ORDER — ATORVASTATIN CALCIUM 10 MG PO TABS
20.0000 mg | ORAL_TABLET | Freq: Every day | ORAL | Status: DC
  Administered 2019-09-16 – 2019-09-17 (×2): 20 mg
  Filled 2019-09-14 (×2): qty 2

## 2019-09-14 MED ORDER — LEVOTHYROXINE SODIUM 50 MCG PO TABS
50.0000 ug | ORAL_TABLET | Freq: Every day | ORAL | Status: DC
  Administered 2019-09-15 – 2019-09-17 (×2): 50 ug
  Filled 2019-09-14 (×2): qty 1

## 2019-09-14 NOTE — Progress Notes (Signed)
STROKE TEAM PROGRESS NOTE   INTERVAL HISTORY Her son is at bedside. 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  No history of A. fib documented.  Patient had a 30-day CardioNet monitor in 10/2017 showed negative for A. fib.  Will consider loop recorder.  OBJECTIVE Vitals:   09/14/19 0328 09/14/19 0743 09/14/19 1228 09/14/19 1643  BP: (!) 151/72 132/87 (!) 149/73 (!) 152/78  Pulse: 77 64 68 68  Resp: 16 20 18 18   Temp: 97.8 F (36.6 C) 98.7 F (37.1 C) 98.4 F (36.9 C) 99 F (37.2 C)  TempSrc: Oral Oral Oral Oral  SpO2: 99% 94% 94% 95%  Weight: 75.7 kg     Height:        CBC:  Recent Labs  Lab 09/11/19 0817 09/12/19 0507 09/14/19 0312  WBC 10.7* 10.6* 7.8  NEUTROABS 8.8* 8.8*  --   HGB 11.8* 11.1* 10.2*  HCT 35.8* 33.1* 30.0*  MCV 93.0 92.5 91.7  PLT 220 140* 131*    Basic Metabolic Panel:  Recent Labs  Lab 09/12/19 0507 09/14/19 0312  NA 142 144  K 3.8 3.5  CL 112* 112*  CO2 20* 23  GLUCOSE 137* 172*  BUN 16 10  CREATININE 1.05* 0.81  CALCIUM 8.6* 8.4*    Lipid Panel:     Component Value Date/Time   CHOL 116 09/12/2019 0507   TRIG 169 (H) 09/12/2019 0507   HDL 39 (L) 09/12/2019 0507   CHOLHDL 3.0 09/12/2019 0507   VLDL 34 09/12/2019 0507   LDLCALC 43 09/12/2019 0507   HgbA1c:  Lab Results  Component Value Date   HGBA1C 6.7 (H) 09/12/2019   Urine Drug Screen: No results found for: LABOPIA, COCAINSCRNUR, LABBENZ, AMPHETMU, THCU, LABBARB  Alcohol Level No results found for: ETH  IMAGING Ct Code Stroke Cta Head W/wo Contrast  Result Date: 09/11/2019 CLINICAL DATA:  Stroke symptoms EXAM: CT ANGIOGRAPHY HEAD AND NECK CT PERFUSION BRAIN TECHNIQUE: Multidetector CT imaging of the head and neck was performed using the standard protocol during bolus administration of intravenous contrast. Multiplanar CT image reconstructions and MIPs were obtained to  evaluate the vascular anatomy. Carotid stenosis measurements (when applicable) are obtained utilizing NASCET criteria, using the distal internal carotid diameter as the denominator. Multiphase CT imaging of the brain was performed following IV bolus contrast injection. Subsequent parametric perfusion maps were calculated using RAPID software. CONTRAST:  157mL OMNIPAQUE IOHEXOL 350 MG/ML SOLN COMPARISON:  Head CT from earlier today. FINDINGS: Delayed study relative to the noncontrast CT due to difficulty with IV access per report. CTA NECK FINDINGS Aortic arch: Atherosclerotic calcification.  Three vessel branching. Right carotid system: Limited atheromatous changes. No stenosis or ulceration. Left carotid system: Limited atheromatous changes for age. No stenosis or ulceration. Vertebral arteries: Proximal subclavian atherosclerosis is mild. Widely patent vertebral arteries with smooth luminal contour. Skeleton: No acute finding.  Cervical spine degeneration. Other neck: No significant incidental finding Upper chest: Negative Review of the MIP images confirms the above findings CTA HEAD FINDINGS Anterior circulation: Central branching filling defect at the left distal M1 and proximal M2 segments with underfilling of downstream vessels. No right-sided embolism or branch occlusion is seen. Hypoplastic left A1 segment. Atherosclerotic plaque on both carotid siphons mild to moderate narrowing on the right. Posterior circulation: Vertebrobasilar arteries are smooth and widely patent. No branch occlusion or beading. Negative for aneurysm Venous sinuses: Unremarkable in  the arterial phase Anatomic variants: As above Review of the MIP images confirms the above findings CT Brain Perfusion Findings: ASPECTS: Not scored given the extent of chronic changes. No acute infarct is detected. CBF (<30%) Volume: 6mL-some of this mapping may be overestimated as there is likely some bleeding into the chronic infarct the lateral frontal  lobe. Perfusion (Tmax>6.0s) volume: 50mL Mismatch Volume: 56 with mismatched ratio of 2.6mL Infarction Location:Left posterior frontal These results were communicated to Dr. Lorraine Lax at [place] 10/28/2020by text page via the [facility] messaging system. Embolectomy procedure has already been ordered. IMPRESSION: 1. 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 There has been a remote moderate left lateral frontal and upper insular infarct; CT perfusion maps show 38 cc of completed acute infarct and 56 cc of superimposed penumbra. 2. Limited atherosclerosis for age. No underlying flow limiting stenosis or embolic source identified. Electronically Signed   By: Monte Fantasia M.D.   On: 09/11/2019 07:27   Ct Code Stroke Cta Neck W/wo Contrast  Result Date: 09/11/2019 CLINICAL DATA:  Stroke symptoms EXAM: CT ANGIOGRAPHY HEAD AND NECK CT PERFUSION BRAIN TECHNIQUE: Multidetector CT imaging of the head and neck was performed using the standard protocol during bolus administration of intravenous contrast. Multiplanar CT image reconstructions and MIPs were obtained to evaluate the vascular anatomy. Carotid stenosis measurements (when applicable) are obtained utilizing NASCET criteria, using the distal internal carotid diameter as the denominator. Multiphase CT imaging of the brain was performed following IV bolus contrast injection. Subsequent parametric perfusion maps were calculated using RAPID software. CONTRAST:  149mL OMNIPAQUE IOHEXOL 350 MG/ML SOLN COMPARISON:  Head CT from earlier today. FINDINGS: Delayed study relative to the noncontrast CT due to difficulty with IV access per report. CTA NECK FINDINGS Aortic arch: Atherosclerotic calcification.  Three vessel branching. Right carotid system: Limited atheromatous changes. No stenosis or ulceration. Left carotid system: Limited atheromatous changes for age. No stenosis or ulceration. Vertebral arteries: Proximal  subclavian atherosclerosis is mild. Widely patent vertebral arteries with smooth luminal contour. Skeleton: No acute finding.  Cervical spine degeneration. Other neck: No significant incidental finding Upper chest: Negative Review of the MIP images confirms the above findings CTA HEAD FINDINGS Anterior circulation: Central branching filling defect at the left distal M1 and proximal M2 segments with underfilling of downstream vessels. No right-sided embolism or branch occlusion is seen. Hypoplastic left A1 segment. Atherosclerotic plaque on both carotid siphons mild to moderate narrowing on the right. Posterior circulation: Vertebrobasilar arteries are smooth and widely patent. No branch occlusion or beading. Negative for aneurysm Venous sinuses: Unremarkable in the arterial phase Anatomic variants: As above Review of the MIP images confirms the above findings CT Brain Perfusion Findings: ASPECTS: Not scored given the extent of chronic changes. No acute infarct is detected. CBF (<30%) Volume: 72mL-some of this mapping may be overestimated as there is likely some bleeding into the chronic infarct the lateral frontal lobe. Perfusion (Tmax>6.0s) volume: 30mL Mismatch Volume: 56 with mismatched ratio of 2.93mL Infarction Location:Left posterior frontal These results were communicated to Dr. Lorraine Lax at [place] 10/28/2020by text page via the [facility] messaging system. Embolectomy procedure has already been ordered. IMPRESSION: 1. 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 There has been a remote moderate left lateral frontal and upper insular infarct; CT perfusion maps show 38 cc of completed acute infarct and 56 cc of superimposed penumbra. 2. Limited atherosclerosis for age. No underlying flow  limiting stenosis or embolic source identified. Electronically Signed   By: Monte Fantasia M.D.   On: 09/11/2019 07:27   Mr Brain Wo Contrast  Result Date:  09/12/2019 CLINICAL DATA:  Mild aphasia. EXAM: MRI HEAD WITHOUT CONTRAST TECHNIQUE: Multiplanar, multiecho pulse sequences of the brain and surrounding structures were obtained without intravenous contrast. COMPARISON:  Code stroke CT from yesterday FINDINGS: Brain: Restricted diffusion throughout the large majority of the left MCA distribution cortex, with sparing at the margins including along the upper motor strip and in the superficial left temporal lobe. Tiny acute cortical infarct seen in the parasagittal right frontal lobe, ACA territory. Petechial hemorrhage is present along the left cerebral infarct. Moderate remote left MCA branch infarct affecting the lateral frontal lobe and upper insula. Remote right occipital infarct. Small remote right cerebellar infarct. Ischemic gliosis is confluent in the cerebral white matter. Age congruent volume loss. Vascular: Normal flow voids Skull and upper cervical spine: Negative for marrow lesion Sinuses/Orbits: Right cataract resection. Mild mucosal thickening in the paranasal sinuses. Partial opacification of the right mastoid air cells. IMPRESSION: 1. Acute cortical infarct involving the majority of the left MCA distribution, sparing the upper perirolandic cortex and the superficial left temporal lobe. There is patchy petechial hemorrhage. 2. 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 Extensive chronic small vessel ischemia. Electronically Signed   By: Monte Fantasia M.D.   On: 09/12/2019 07:06   [place]  Result Date: 09/12/2019 INDICATION: New onset of left gaze deviation, aphasia, and right-sided weakness. Occluded left middle cerebral artery proximally on CT angiogram of the head and neck. EXAM: 1. EMERGENT LARGE VESSEL OCCLUSION THROMBOLYSIS (anterior CIRCULATION) COMPARISON:  CT angiogram of the head and neck of September 11, 2019. MEDICATIONS: Vancomycin 1 g IV antibiotic was administered within 1 hour of the procedure. ANESTHESIA/SEDATION:  General anesthesia. CONTRAST:  Isovue 300 approximately 60 mL. FLUOROSCOPY TIME:  Fluoroscopy Time: 36 minutes 0 seconds (1498 mGy). COMPLICATIONS: None immediate. TECHNIQUE: Following a full explanation of the procedure along with the potential associated complications, an informed witnessed consent was obtained from the spouse and patient's daughter. The risks of intracranial hemorrhage of 10%, worsening neurological deficit, ventilator dependency, death and inability to revascularize were all reviewed in detail with the patient's spouse and daughter. The patient was then put under general anesthesia by the Department of Anesthesiology at [facility]. The right groin was prepped and draped in the usual sterile fashion. Thereafter using modified Seldinger technique, transfemoral access into the right common femoral artery was obtained without difficulty. Over a 0.035 inch guidewire a 5 French Pinnacle sheath was inserted. Through this, and also over a 0.035 inch guidewire a 5 [place] JB 1 catheter was advanced to the aortic arch region and selectively positioned in the left common carotid artery. FINDINGS: The left common carotid arteriogram demonstrates the left external carotid artery and its major branches to be widely patent. The left internal carotid artery at the bulb to the cranial skull base demonstrates wide patency. The petrous, cavernous and the supraclinoid segments are widely patent. A small infundibulum is seen at the origin of the left posterior communicating artery. The left anterior cerebral artery opacifies into the capillary and venous phases. The left middle cerebral artery just distal to the anterior temporal branch demonstrates complete occlusion. PROCEDURE: The diagnostic JB 1 catheter in the left common carotid artery was then exchanged over a 0.035 300 cm Rosen exchange guidewire for an 8 [place] Pinnacle sheath which was  then connected to continuous heparinized saline infusion. Over  the [facility] exchange guidewire, a 95 cm 087 Qapel balloon guide catheter which had been prepped with 50% contrast and 50% heparinized saline infusion was advanced and positioned just proximal to the left common carotid bifurcation. The guidewire was removed. Good aspiration obtained from the hub of the balloon guide catheter. A gentle control arteriogram performed through this demonstrated no change in the intracranial or the extracranial circulation. Over a 0.014 inch Softip Synchro micro guidewire, a combination of an 021 Trevo ProVue microcatheter inside of a 6 French 132 cm Catalyst guide catheter was advanced without difficulty to the supraclinoid left ICA. With the micro guidewire leading with a J-tip configuration to avoid dissections or inducing spasm, the combination was navigated to the proximal left middle cerebral artery. Using a torque device the micro guidewire was then advanced to the distal M2 M3 region of the inferior division followed by the microcatheter. The guidewire was removed. Good aspiration was obtained from the hub of the microcatheter. Gentle control arteriogram performed through the microcatheter demonstrated safe position of tip of the microcatheter. This was then connected to continuous heparinized saline infusion. A 5 mm x 33 mm Embotrap retrieval device was then advanced to the distal end of the microcatheter. With the O ring on the delivery microcatheter loosened, and slight forward traction with the right hand on the delivery micro guidewire, the distal and then the proximal portion of the device was then deployed. At this time the 6 [place] Catalyst guide catheter was advanced in order to engage the proximal portion of the clot. With proximal flow arrest in the right internal carotid artery with balloon catheter having been advanced into the proximal 1/3, and constant aspiration being applied with a 60 mL syringe at the hub of the balloon guide catheter and aspiration applied with a  Penumbra aspiration device at the hub of the 6 [place] Catalyst guide catheter for approximately 2-1/2 minutes, the combination of the retrieval device, the microcatheter and the 6 [place] Catalyst guide catheter was retrieved and removed. Aspiration was continued whilst the balloon was deflated in the left internal carotid artery. A few specks of clot were seen entangled in the cells of the retrieval device. A control arteriogram performed through the balloon guide catheter in the proximal left internal carotid artery demonstrated slight proximal migration of the clot compared to previously. A second pass was then made again using the above combination. After having ascertained safe position of tip of the microcatheter in the M2 M3 region of the inferior division, and having verified the safe tip of the microcatheter, a 4 mm x 40 mm Solitaire X retrieval device was advanced to the distal end of the microcatheter. The O ring on the delivery microcatheter was then loosened. With slight forward gentle traction with the right hand on the delivery micro guidewire, with the left hand the delivery microcatheter was retrieved unsheathing the retrieval device. The 6 [place] Catalyst guide catheter was now advanced further into the clot of the left middle cerebral artery. Proximal flow arrest was then initiated by inflating the balloon guide catheter in the mid 1/3 of the left internal carotid artery. Constant aspiration with a Penumbra aspiration device was then initiated at the hub of the Catalyst guide catheter for approximately 2 minutes. Following this, the combination of the retrieval device, the microcatheter and the 6 [place] Catalyst guide catheter were then retrieved and removed as constant aspiration was applied at the hub of the  balloon guide catheter with deflation of the balloon in the left internal carotid artery. Free back bleed of blood was seen at the hub of the balloon guide catheter. A control arteriogram  performed through the balloon guide catheter in the left internal carotid artery demonstrated complete revascularization of the left middle cerebral artery achieving a TICI 3 revascularization. The left anterior cerebral artery remained widely patent as well. Wide patency of the left internal carotid artery extra cranially was maintained with mild spasm at the distal 1/3 of the left internal carotid artery. The balloon guide catheter was retrieved and removed. The 8 French Pinnacle sheath in the right groin was then removed with the successful application of an 8 [place] Angio-Seal closure device. The right groin appeared soft without evidence of a hematoma or bleeding. Distal pulses in the dorsalis pedis, and the posterior tibial regions remained palpable unchanged. A flat panel CT of the brain revealed no evidence of an intracranial hemorrhage, or mass effect or midline shift. The patient's general anesthesia was then reversed and the patient extubated. Upon recovery, the patient was able to move her left arm and leg, and bend her right knee spontaneously and to command. She was then transferred to the PACU and then neuro ICU for post thrombectomy management. IMPRESSION: Status post endovascular complete revascularization of occluded left middle cerebral artery M1 segment with 1 pass with the Embotrap 5 mm x 33 mm retrieval device, and 1 pass with the Solitaire 4 mm x 40 mm X retrieval device, and adjunct Penumbra aspiration achieving a TICI 3 revascularization. PLAN: Follow-up in clinic 4 weeks post discharge. Electronically Signed   By: Luanne Bras M.D.   On: 09/11/2019 11:53   Ct Code Stroke Cta Cerebral Perfusion W/wo Contrast  Result Date: 09/11/2019 CLINICAL DATA:  Stroke symptoms EXAM: CT ANGIOGRAPHY HEAD AND NECK CT PERFUSION BRAIN TECHNIQUE: Multidetector CT imaging of the head and neck was performed using the standard protocol during bolus administration of intravenous contrast. Multiplanar CT  image reconstructions and MIPs were obtained to evaluate the vascular anatomy. Carotid stenosis measurements (when applicable) are obtained utilizing NASCET criteria, using the distal internal carotid diameter as the denominator. Multiphase CT imaging of the brain was performed following IV bolus contrast injection. Subsequent parametric perfusion maps were calculated using RAPID software. CONTRAST:  176mL OMNIPAQUE IOHEXOL 350 MG/ML SOLN COMPARISON:  Head CT from earlier today. FINDINGS: Delayed study relative to the noncontrast CT due to difficulty with IV access per report. CTA NECK FINDINGS Aortic arch: Atherosclerotic calcification.  Three vessel branching. Right carotid system: Limited atheromatous changes. No stenosis or ulceration. Left carotid system: Limited atheromatous changes for age. No stenosis or ulceration. Vertebral arteries: Proximal subclavian atherosclerosis is mild. Widely patent vertebral arteries with smooth luminal contour. Skeleton: No acute finding.  Cervical spine degeneration. Other neck: No significant incidental finding Upper chest: Negative Review of the MIP images confirms the above findings CTA HEAD FINDINGS Anterior circulation: Central branching filling defect at the left distal M1 and proximal M2 segments with underfilling of downstream vessels. No right-sided embolism or branch occlusion is seen. Hypoplastic left A1 segment. Atherosclerotic plaque on both carotid siphons mild to moderate narrowing on the right. Posterior circulation: Vertebrobasilar arteries are smooth and widely patent. No branch occlusion or beading. Negative for aneurysm Venous sinuses: Unremarkable in the arterial phase Anatomic variants: As above Review of the MIP images confirms the above findings CT Brain Perfusion Findings: ASPECTS: Not scored given the extent of chronic changes. No acute  infarct is detected. CBF (<30%) Volume: 56mL-some of this mapping may be overestimated as there is likely some  bleeding into the chronic infarct the lateral frontal lobe. Perfusion (Tmax>6.0s) volume: 70mL Mismatch Volume: 56 with mismatched ratio of 2.73mL Infarction Location:Left posterior frontal These results were communicated to Dr. Lorraine Lax at [place] 10/28/2020by text page via the [facility] messaging system. Embolectomy procedure has already been ordered. IMPRESSION: 1. 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 There has been a remote moderate left lateral frontal and upper insular infarct; CT perfusion maps show 38 cc of completed acute infarct and 56 cc of superimposed penumbra. 2. Limited atherosclerosis for age. No underlying flow limiting stenosis or embolic source identified. Electronically Signed   By: Monte Fantasia M.D.   On: 09/11/2019 07:27   Ir Percutaneous Art Thrombectomy/infusion Intracranial Inc Diag Angio  Result Date: 09/12/2019 INDICATION: New onset of left gaze deviation, aphasia, and right-sided weakness. Occluded left middle cerebral artery proximally on CT angiogram of the head and neck. EXAM: 1. EMERGENT LARGE VESSEL OCCLUSION THROMBOLYSIS (anterior CIRCULATION) COMPARISON:  CT angiogram of the head and neck of September 11, 2019. MEDICATIONS: Vancomycin 1 g IV antibiotic was administered within 1 hour of the procedure. ANESTHESIA/SEDATION: General anesthesia. CONTRAST:  Isovue 300 approximately 60 mL. FLUOROSCOPY TIME:  Fluoroscopy Time: 36 minutes 0 seconds (1498 mGy). COMPLICATIONS: None immediate. TECHNIQUE: Following a full explanation of the procedure along with the potential associated complications, an informed witnessed consent was obtained from the spouse and patient's daughter. The risks of intracranial hemorrhage of 10%, worsening neurological deficit, ventilator dependency, death and inability to revascularize were all reviewed in detail with the patient's spouse and daughter. The patient was then put under general  anesthesia by the Department of Anesthesiology at [facility]. The right groin was prepped and draped in the usual sterile fashion. Thereafter using modified Seldinger technique, transfemoral access into the right common femoral artery was obtained without difficulty. Over a 0.035 inch guidewire a 5 French Pinnacle sheath was inserted. Through this, and also over a 0.035 inch guidewire a 5 [place] JB 1 catheter was advanced to the aortic arch region and selectively positioned in the left common carotid artery. FINDINGS: The left common carotid arteriogram demonstrates the left external carotid artery and its major branches to be widely patent. The left internal carotid artery at the bulb to the cranial skull base demonstrates wide patency. The petrous, cavernous and the supraclinoid segments are widely patent. A small infundibulum is seen at the origin of the left posterior communicating artery. The left anterior cerebral artery opacifies into the capillary and venous phases. The left middle cerebral artery just distal to the anterior temporal branch demonstrates complete occlusion. PROCEDURE: The diagnostic JB 1 catheter in the left common carotid artery was then exchanged over a 0.035 300 cm Rosen exchange guidewire for an 8 [place] Pinnacle sheath which was then connected to continuous heparinized saline infusion. Over the [facility] exchange guidewire, a 95 cm 087 Qapel balloon guide catheter which had been prepped with 50% contrast and 50% heparinized saline infusion was advanced and positioned just proximal to the left common carotid bifurcation. The guidewire was removed. Good aspiration obtained from the hub of the balloon guide catheter. A gentle control arteriogram performed through this demonstrated no change in the intracranial or the extracranial circulation. Over a 0.014 inch Softip Synchro micro guidewire, a combination of an 021 Trevo ProVue microcatheter inside of a 6 [place] 132  cm Catalyst guide  catheter was advanced without difficulty to the supraclinoid left ICA. With the micro guidewire leading with a J-tip configuration to avoid dissections or inducing spasm, the combination was navigated to the proximal left middle cerebral artery. Using a torque device the micro guidewire was then advanced to the distal M2 M3 region of the inferior division followed by the microcatheter. The guidewire was removed. Good aspiration was obtained from the hub of the microcatheter. Gentle control arteriogram performed through the microcatheter demonstrated safe position of tip of the microcatheter. This was then connected to continuous heparinized saline infusion. A 5 mm x 33 mm Embotrap retrieval device was then advanced to the distal end of the microcatheter. With the O ring on the delivery microcatheter loosened, and slight forward traction with the right hand on the delivery micro guidewire, the distal and then the proximal portion of the device was then deployed. At this time the 6 [place] Catalyst guide catheter was advanced in order to engage the proximal portion of the clot. With proximal flow arrest in the right internal carotid artery with balloon catheter having been advanced into the proximal 1/3, and constant aspiration being applied with a 60 mL syringe at the hub of the balloon guide catheter and aspiration applied with a Penumbra aspiration device at the hub of the 6 [place] Catalyst guide catheter for approximately 2-1/2 minutes, the combination of the retrieval device, the microcatheter and the 6 [place] Catalyst guide catheter was retrieved and removed. Aspiration was continued whilst the balloon was deflated in the left internal carotid artery. A few specks of clot were seen entangled in the cells of the retrieval device. A control arteriogram performed through the balloon guide catheter in the proximal left internal carotid artery demonstrated slight proximal migration of the clot compared to previously.  A second pass was then made again using the above combination. After having ascertained safe position of tip of the microcatheter in the M2 M3 region of the inferior division, and having verified the safe tip of the microcatheter, a 4 mm x 40 mm Solitaire X retrieval device was advanced to the distal end of the microcatheter. The O ring on the delivery microcatheter was then loosened. With slight forward gentle traction with the right hand on the delivery micro guidewire, with the left hand the delivery microcatheter was retrieved unsheathing the retrieval device. The 6 [place] Catalyst guide catheter was now advanced further into the clot of the left middle cerebral artery. Proximal flow arrest was then initiated by inflating the balloon guide catheter in the mid 1/3 of the left internal carotid artery. Constant aspiration with a Penumbra aspiration device was then initiated at the hub of the Catalyst guide catheter for approximately 2 minutes. Following this, the combination of the retrieval device, the microcatheter and the 6 [place] Catalyst guide catheter were then retrieved and removed as constant aspiration was applied at the hub of the balloon guide catheter with deflation of the balloon in the left internal carotid artery. Free back bleed of blood was seen at the hub of the balloon guide catheter. A control arteriogram performed through the balloon guide catheter in the left internal carotid artery demonstrated complete revascularization of the left middle cerebral artery achieving a TICI 3 revascularization. The left anterior cerebral artery remained widely patent as well. Wide patency of the left internal carotid artery extra cranially was maintained with mild spasm at the distal 1/3 of the left internal carotid artery. The balloon guide catheter was  retrieved and removed. The 8 [place] Pinnacle sheath in the right groin was then removed with the successful application of an 8 [place] Angio-Seal closure  device. The right groin appeared soft without evidence of a hematoma or bleeding. Distal pulses in the dorsalis pedis, and the posterior tibial regions remained palpable unchanged. A flat panel CT of the brain revealed no evidence of an intracranial hemorrhage, or mass effect or midline shift. The patient's general anesthesia was then reversed and the patient extubated. Upon recovery, the patient was able to move her left arm and leg, and bend her right knee spontaneously and to command. She was then transferred to the PACU and then neuro ICU for post thrombectomy management. IMPRESSION: Status post endovascular complete revascularization of occluded left middle cerebral artery M1 segment with 1 pass with the Embotrap 5 mm x 33 mm retrieval device, and 1 pass with the Solitaire 4 mm x 40 mm X retrieval device, and adjunct Penumbra aspiration achieving a TICI 3 revascularization. PLAN: Follow-up in clinic 4 weeks post discharge. Electronically Signed   By: Luanne Bras M.D.   On: 09/11/2019 11:53   Ct Head Code Stroke Wo Contrast  Result Date: 09/11/2019 CLINICAL DATA:  Code stroke.  Generalized muscle weakness EXAM: CT HEAD WITHOUT CONTRAST TECHNIQUE: Contiguous axial images were obtained from the base of the skull through the vertex without intravenous contrast. COMPARISON:  Brain MRI 09/08/2017 FINDINGS: Brain: No evidence of acute infarction, hemorrhage, hydrocephalus, extra-axial collection or mass lesion/mass effect. Moderate remote left MCA branch infarct affecting the lateral frontal lobe. Moderate remote right occipital infarct. Extensive chronic small vessel ischemic gliosis in the cerebral white matter. Small remote right cerebellar infarct Vascular: Hyperdense left distal M1 segment extending into the M2 branches. Skull: Normal. Negative for fracture or focal lesion. Sinuses/Orbits: No acute finding. Other: These results were communicated to Dr. Lorraine Lax at 6:34 amon 10/28/2020by text page via the  [facility] messaging system. ASPECTS [facility] Stroke Program Early CT Score) Not scored given the extent of chronic change. IMPRESSION: 1. Hyperdense distal left M1 segment suggesting acute thrombosis. No hemorrhage or visible acute infarct. There has been a remote moderate left MCA branch infarct at the lateral frontal lobe. 2. Advanced chronic small vessel ischemia in the cerebral white matter. There is also been remote right occipital infarct. Electronically Signed   By: Monte Fantasia M.D.   On: 09/11/2019 06:36   Vas [place] Lower Extremity Venous (dvt)  Result Date: 09/14/2019  Lower Venous Study Indications: Stroke.  Limitations: Confusion. Comparison Study: No prior study on file for comparison Performing Technologist: Sharion Dove RVS  Examination Guidelines: A complete evaluation includes B-mode imaging, spectral Doppler, color Doppler, and power Doppler as needed of all accessible portions of each vessel. Bilateral testing is considered an integral part of a complete examination. Limited examinations for reoccurring indications may be performed as noted.  +---------+---------------+---------+-----------+----------+--------------+ RIGHT    CompressibilityPhasicitySpontaneityPropertiesThrombus Aging +---------+---------------+---------+-----------+----------+--------------+ CFV      Full           Yes      Yes                                 +---------+---------------+---------+-----------+----------+--------------+ SFJ      Full                                                        +---------+---------------+---------+-----------+----------+--------------+  FV Prox  Full                                                        +---------+---------------+---------+-----------+----------+--------------+ FV Mid   Full                                                        +---------+---------------+---------+-----------+----------+--------------+ FV DistalFull                                                         +---------+---------------+---------+-----------+----------+--------------+ PFV      Full                                                        +---------+---------------+---------+-----------+----------+--------------+ POP      Full           Yes      Yes                                 +---------+---------------+---------+-----------+----------+--------------+ PTV      Full                                                        +---------+---------------+---------+-----------+----------+--------------+ PERO     Full                                                        +---------+---------------+---------+-----------+----------+--------------+   +---------+---------------+---------+-----------+----------+-------------------+ LEFT     CompressibilityPhasicitySpontaneityPropertiesThrombus Aging      +---------+---------------+---------+-----------+----------+-------------------+ CFV      Full           Yes      Yes                                      +---------+---------------+---------+-----------+----------+-------------------+ SFJ      Full                                                             +---------+---------------+---------+-----------+----------+-------------------+ FV Prox  Full                                                             +---------+---------------+---------+-----------+----------+-------------------+  FV Mid   Full                                                             +---------+---------------+---------+-----------+----------+-------------------+ FV DistalFull                                                             +---------+---------------+---------+-----------+----------+-------------------+ PFV      Full                                                             +---------+---------------+---------+-----------+----------+-------------------+ POP                      Yes      Yes                  patent by color and                                                       Doppler             +---------+---------------+---------+-----------+----------+-------------------+ PTV      Full                                                             +---------+---------------+---------+-----------+----------+-------------------+ PERO     Full                                                             +---------+---------------+---------+-----------+----------+-------------------+     Summary: Right: There is no evidence of deep vein thrombosis in the lower extremity. Left: There is no evidence of deep vein thrombosis in the lower extremity.  *See table(s) above for measurements and observations. Electronically signed by Deitra Mayo MD on 09/14/2019 at 5:56:00 PM.    Final     Cerebral Angio S/P Lt common carotid arteriogram followed by complete revascularization of occluded Lt LMCA M 1 seg with x 1 pass with the embotrap 2mm x 92mm retriver device and x 1 pass with the solitaire 62mmx 40 mmX retriever device achieving a TICI 3 revascularization.  Transthoracic Echocardiogram  09/12/2019  1. The patient refused the complete study. Globally EF appears normal ~65%. RV functional appears globally normal. Unable to provide much more information than that on this limited study.  2. Left ventricular ejection fraction, by visual estimation, is  60 to 65%. The left ventricle has normal function. There is mildly increased left ventricular hypertrophy.  3. Left ventricular diastolic parameters are consistent with Grade I diastolic dysfunction (impaired relaxation).  4. Global right ventricle has normal systolic function.The right ventricular size is normal. No increase in right ventricular wall thickness.  5. Left atrial size was not assessed.  6. Right atrial size was not assessed.  7. Presence of pericardial fat pad.  8. Mild  mitral annular calcification.  9. The mitral valve was not assessed. not assessed mitral valve regurgitation. 10. The tricuspid valve is not assessed. Tricuspid valve regurgitation not assessed. 11. The aortic valve was not assessed. Aortic valve regurgitation is mild. 12. The pulmonic valve was not assessed. Pulmonic valve regurgitation not assessed. 13. Aortic root could not be assessed. 14. The interatrial septum was not assessed.  ECG - SR rate 70 BPM. (See cardiology reading for complete details)  PHYSICAL EXAM    Temp:  [97.8 F (36.6 C)-99 F (37.2 C)] 99 F (37.2 C) (10/31 1643) Pulse Rate:  [64-77] 68 (10/31 1643) Resp:  [16-20] 18 (10/31 1643) BP: (132-154)/(68-87) 152/78 (10/31 1643) SpO2:  [94 %-99 %] 95 % (10/31 1643) Weight:  [75.7 kg] 75.7 kg (10/31 0328)  General - Well nourished, well developed, in no apparent distress.  Ophthalmologic - fundi not visualized due to noncooperation.  Cardiovascular - Regular rhythm and rate.  Neuro -awake, alert, largely global aphasia except able to close eyes and open mouth as requested.  Not follow other simple commands and word salad.  No gaze preference, attending to both sides, EOMI, PERRL.  Blinking to visual threat bilaterally.  Significant right facial droop.  Tongue protrusion not corporative.  Moving all extremities symmetrically and against gravity.  DTR 1+, no Babinski. Sensation, coordination and gait not tested.    ASSESSMENT/PLAN [name] is a 78 y.o. female with history of significant for congestive heart failure(chronic diastolic heart failure), prior stroke, hypertension, hyperlipidemia and prior stroke with mild left leg weakness and mild aphasia" emergency department as a code stroke for left gaze deviation, right-sided weakness and aphasia secondary to left MCA stroke. Out of the tPA window. Sent to IR for L M1 occlusion.   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   Code Stroke CT Head - hyperdense L M1. No acute infarct. Old L MCA infarct L lateral frontal lobe. Old R occipital infarct.  CTA H&N - left M1 occlusion into M2 branches.  CT Perfusion - 38 cc core with 56 cc penumbra.  Cerebral angio occlusion L MCA M1 w/ TICI3 revascularization  MRI L MCA cortical infarct, patchy petechial hemorrhage. Old L MCA and R PCA infarcts   2D Echo - EF 65%%. No source of embolus   LE venous Doppler no DVT  10/2017 30-day cardiac event monitor negative for A. Fib  Will recommend loop recorder to further rule out A. fib  LDL 43   HgbA1c 5.7   VTE prophylaxis -  Lovenox 40 mg sq daily     ASA 81mg  + Plavix  prior to admission, now on aspirin 325 and Plavix DAPT.  Therapy recommendations:  CIR  Disposition:  Pending  Hx stroke/TIA  10/2017 30-day Cardiac event monitor no A. fib  2016 seen by [name] in the office - resultant gait d/o and dysarthria due to extensive small vessel disease, no acute  infarct on MRI. On Aggrenox  08/2009 - pt reported stroke, details not available in [facility]  02/2007 - pt reported stroke, details not available in EPIC  Hypertension  Home BP meds: Apresoline, lasix, lopressor  Current BP meds: Lopressor and Lasix . BP goal to < 180 . BP stable    . Long-term BP goal normotensive  Hyperlipidemia  Home Lipid lowering medication: Lipitor 40  LDL 43, goal < 70  Current lipid lowering medication: Lipitor 20 mg daily   Continue statin at discharge  Dysphagia . Secondary to stroke . Failed bedside swallow - NPO . Speech on board . Did not pass MBSS yesterday . Place cortrak . on TF @ 34 and free water 200 Q6   Other Stroke Risk Factors  Advanced age  Congestive Heart Failure  Other Active Problems  Leukocytosis 10.6->7.8  Acute blood loss anemia 12.9-11.8-11.1->10.2  Pulmonary fibrosis    Mild thrombocytopenia  - [facility] day # 3  I had long discussion with son at bedside, updated pt current condition, treatment plan and potential prognosis, and answered all the questions. Son expressed understanding and appreciation.   Rosalin Hawking, MD PhD Stroke Neurology 09/14/2019 7:26 PM   To contact Stroke Continuity provider, please refer to [email or web address] After hours, contact General Neurology

## 2019-09-14 NOTE — Progress Notes (Signed)
VASCULAR LAB PRELIMINARY  PRELIMINARY  PRELIMINARY  PRELIMINARY  Bilateral lower extremity venous duplex completed.    Preliminary report:  See CV proc for preliminary results.   Karly Pitter, RVT 09/14/2019, 5:49 PM

## 2019-09-15 DIAGNOSIS — R4701 Aphasia: Secondary | ICD-10-CM

## 2019-09-15 LAB — BASIC METABOLIC PANEL
Anion gap: 8 (ref 5–15)
BUN: 15 mg/dL (ref 8–23)
CO2: 25 mmol/L (ref 22–32)
Calcium: 8.5 mg/dL — ABNORMAL LOW (ref 8.9–10.3)
Chloride: 108 mmol/L (ref 98–111)
Creatinine, Ser: 0.91 mg/dL (ref 0.44–1.00)
GFR calc Af Amer: >60 mL/min (ref >60)
GFR calc non Af Amer: >60 mL/min (ref >60)
Glucose, Bld: 147 mg/dL — ABNORMAL HIGH (ref 70–99)
Potassium: 3.8 mmol/L (ref 3.5–5.1)
Sodium: 141 mmol/L (ref 135–145)

## 2019-09-15 LAB — GLUCOSE, CAPILLARY
Glucose-Capillary: 109 mg/dL — ABNORMAL HIGH (ref 70–99)
Glucose-Capillary: 115 mg/dL — ABNORMAL HIGH (ref 70–99)
Glucose-Capillary: 117 mg/dL — ABNORMAL HIGH (ref 70–99)
Glucose-Capillary: 124 mg/dL — ABNORMAL HIGH (ref 70–99)
Glucose-Capillary: 128 mg/dL — ABNORMAL HIGH (ref 70–99)
Glucose-Capillary: 153 mg/dL — ABNORMAL HIGH (ref 70–99)

## 2019-09-15 LAB — CBC
HCT: 29.5 % — ABNORMAL LOW (ref 36.0–46.0)
Hemoglobin: 9.9 g/dL — ABNORMAL LOW (ref 12.0–15.0)
MCH: 31.2 pg (ref 26.0–34.0)
MCHC: 33.6 g/dL (ref 30.0–36.0)
MCV: 93.1 fL (ref 80.0–100.0)
Platelets: 180 10*3/uL (ref 150–400)
RBC: 3.17 MIL/uL — ABNORMAL LOW (ref 3.87–5.11)
RDW: 12.9 % (ref 11.5–15.5)
WBC: 7.2 10*3/uL (ref 4.0–10.5)
nRBC: 0 % (ref 0.0–0.2)

## 2019-09-15 MED ORDER — SODIUM CHLORIDE 0.9 % IV SOLN
INTRAVENOUS | Status: DC
  Administered 2019-09-15 – 2019-09-16 (×2): via INTRAVENOUS

## 2019-09-15 MED ORDER — FUROSEMIDE 10 MG/ML IJ SOLN
40.0000 mg | Freq: Two times a day (BID) | INTRAMUSCULAR | Status: DC
  Administered 2019-09-15: 40 mg via INTRAVENOUS
  Filled 2019-09-15: qty 4

## 2019-09-15 MED ORDER — WHITE PETROLATUM EX OINT
TOPICAL_OINTMENT | CUTANEOUS | Status: AC
  Administered 2019-09-15: 0.2
  Filled 2019-09-15: qty 28.35

## 2019-09-15 MED ORDER — HALOPERIDOL LACTATE 5 MG/ML IJ SOLN
1.0000 mg | Freq: Once | INTRAMUSCULAR | Status: AC
  Administered 2019-09-15: 1 mg via INTRAVENOUS
  Filled 2019-09-15: qty 1

## 2019-09-15 MED ORDER — ALTEPLASE 2 MG IJ SOLR: 2.0000 mg | Freq: Once | INTRAMUSCULAR | Status: DC

## 2019-09-15 MED ORDER — SODIUM BICARBONATE 650 MG PO TABS
650.0000 mg | ORAL_TABLET | Freq: Once | ORAL | Status: AC
  Administered 2019-09-15: 650 mg
  Filled 2019-09-15: qty 1

## 2019-09-15 MED ORDER — QUETIAPINE FUMARATE 25 MG PO TABS
25.0000 mg | ORAL_TABLET | Freq: Every day | ORAL | Status: DC
  Administered 2019-09-16: 25 mg via ORAL
  Filled 2019-09-15: qty 1

## 2019-09-15 MED ORDER — PANCRELIPASE (LIP-PROT-AMYL) 10440-39150 UNITS PO TABS
20880.0000 [IU] | ORAL_TABLET | Freq: Once | ORAL | Status: AC
  Administered 2019-09-15: 20880 [IU]
  Filled 2019-09-15: qty 2

## 2019-09-15 NOTE — Discharge Summary (Addendum)
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 Abdominal distention    past two months  . Abnormality of gait 10/28/2015  . 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 Arthritis    right knee  . Benign essential HTN 11/19/2013  . CAP (community acquired pneumonia) 09/07/2013  . CHF (congestive heart failure) ([place])   . Chronic diastolic heart failure ([place]) 11/19/2013  . Chronic kidney disease    STAGE III KIDNEY DISEASE-PER PT--PT SEES DR. Risa Grill UROLOGIST  . Complication of anesthesia    PT REMEMBERS BREATHING PROBLEMS WAKING UP FROM KNEE SURGERY AT [facility] 2011 OR 2012  . Coronary artery calcification seen on CT scan 01/10/2017  . Fever of unknown origin 09/04/2013  . GERD (gastroesophageal reflux disease)   . Headache   . Headache(784.0)   . 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 Hypercholesteremia   . Hypertension   . Hypertensive heart disease with heart failure ([place]) 01/10/2017  . Hypothyroidism   . Hypoxia 09/04/2013  . Leukocytosis 09/04/2013  . Mass of ovary 10/26/2011  . OSA (obstructive sleep apnea) 11/19/2013  . Pneumonia   . Shortness of breath    WITH EXERTION  . Sinus drainage    PT STARTED ANTIBIOTIC 11/17/11 --IS HOARSE TODAY, SOME WHEEZING AND COUGH WITH  YELLOW DRAINAGE  . SIRS (systemic inflammatory response syndrome) ([place]) 09/04/2013  . SOB (shortness of breath) 09/04/2013  . Stroke ([place]) 02/2007, 08/2009   2 total -RESIDUAL WEAKNESS LEFT LEG--AND APHASIA  . Stroke ([place]) 09/01/2017  . Stroke ([place]) 09/08/2017  . Tumor of ovary   . Urinary incontinence   . Urinary incontinence   . Weakness of left leg    Mildly, post stroke   Past Surgical History:  Procedure Laterality Date  . ABDOMINAL HYSTERECTOMY    . CHOLECYSTECTOMY    . IR CT HEAD LTD  09/11/2019  . IR PERCUTANEOUS ART THROMBECTOMY/INFUSION INTRACRANIAL INC DIAG ANGIO  09/11/2019  . LAPAROTOMY  11/22/2011   Procedure: EXPLORATORY LAPAROTOMY;  Surgeon: Imagene Gurney A. Alycia Rossetti, MD;  Location: [facility];  Service: Gynecology;  Laterality: N/A;  . LOOP RECORDER INSERTION N/A 09/16/2019   Procedure: LOOP RECORDER INSERTION;  Surgeon: [place] Lance, MD;  Location: [place] [facility];  Service: Cardiovascular;  Laterality: N/A;  . MENISCUS REPAIR    . RADIOLOGY WITH ANESTHESIA N/A 09/11/2019   Procedure: IR WITH ANESTHESIA;  Surgeon: Radiologist, Medication, MD;  Location: [place];  Service: Radiology;  Laterality: N/A;  . SALPINGOOPHORECTOMY  11/22/2011   Procedure: SALPINGO OOPHERECTOMY;  Surgeon: Imagene Gurney A. Alycia Rossetti, MD;  Location: [facility];  Service: Gynecology;  Laterality: Bilateral;  . TOE SURGERY    . TONSILLECTOMY    .  TUBAL LIGATION      Family History Family History  Problem Relation Age of Onset  . Kidney failure Mother   . Heart attack Father   . Heart failure Father   . Stomach cancer Other   . Stomach cancer Maternal Grandmother   . Stroke Paternal Grandmother     Social History  reports that she quit smoking about 59 years ago. Her smoking use included cigarettes. She has never used smokeless tobacco. She reports current alcohol use. She reports that she does not use drugs.  Allergies as of 09/17/2019      Reactions   Amoxicillin-pot Clavulanate Diarrhea   GI INTOLERANCE ONLY  WITH DIARRHEA      Medication List    STOP taking these medications   febuxostat 40 MG tablet Commonly known as: ULORIC   hydrALAZINE 25 MG tablet Commonly known as: APRESOLINE   MULTI-VITAMIN GUMMIES PO   pantoprazole 40 MG tablet Commonly known as: PROTONIX   potassium chloride SA 20 MEQ tablet Commonly known as: KLOR-CON     TAKE these medications   acetaminophen 325 MG tablet Commonly known as: TYLENOL Take 2 tablets (650 mg total) by mouth every 4 (four) hours as needed for mild pain (or temp > 37.5 C (99.5 F)). What changed:   medication strength  how much to take  when to take this  reasons to take this   aspirin 325 MG tablet Place 1 tablet (325 mg total) into feeding tube daily. Start taking on: September 18, 2019 What changed:   medication strength  how much to take  how to take this  when to take this   atorvastatin 20 MG tablet Commonly known as: LIPITOR Place 1 tablet (20 mg total) into feeding tube daily at 6 PM. What changed:   medication strength  how much to take  how to take this  when to take this   chlorhexidine 0.12 % solution Commonly known as: PERIDEX 15 mLs by Mouth Rinse route 2 (two) times daily.   clopidogrel 75 MG tablet Commonly known as: PLAVIX Place 1 tablet (75 mg total) into feeding tube daily. Start taking on: September 18, 2019 What changed: how to take this   donepezil 5 MG tablet Commonly known as: ARICEPT Place 1 tablet (5 mg total) into feeding tube daily. Start taking on: September 18, 2019 What changed: how to take this   enoxaparin 40 MG/0.4ML injection Commonly known as: LOVENOX Inject 0.4 mLs (40 mg total) into the skin daily. Start taking on: September 18, 2019   feeding supplement (JEVITY 1.2 CAL) Liqd Place 1,000 mLs into feeding tube continuous.   free water Soln Place 200 mLs into feeding tube every 6 (six) hours.   furosemide 40 MG tablet Commonly known as: LASIX Place 1 tablet (40 mg  total) into feeding tube 2 (two) times daily. What changed: how to take this   levothyroxine 50 MCG tablet Commonly known as: SYNTHROID Place 1 tablet (50 mcg total) into feeding tube daily at 6 (six) AM. Start taking on: September 18, 2019 What changed: how to take this   metoprolol tartrate 50 MG tablet Commonly known as: LOPRESSOR Place 1 tablet (50 mg total) into feeding tube 2 (two) times daily. What changed: how to take this   potassium chloride 20 MEQ/15ML (10%) Soln Place 15 mLs (20 mEq total) into feeding tube daily. Start taking on: September 18, 2019   QUEtiapine 25 MG tablet Commonly known as: SEROQUEL Place 1 tablet (  25 mg total) into feeding tube at bedtime.       HOME MEDICATIONS PRIOR TO ADMISSION Medications Prior to Admission  Medication Sig Dispense Refill  . acetaminophen (TYLENOL) 500 MG tablet Take 500 mg by mouth every 6 (six) hours as needed for mild pain.    Marland Kitchen aspirin 81 MG tablet Take 81 mg by mouth daily with breakfast.     . atorvastatin (LIPITOR) 40 MG tablet Take 40 mg by mouth at bedtime.     . clopidogrel (PLAVIX) 75 MG tablet Take 75 mg by mouth daily.     Marland Kitchen donepezil (ARICEPT) 5 MG tablet Take 5 mg by mouth daily.    . febuxostat (ULORIC) 40 MG tablet Take 40 mg by mouth daily.     . furosemide (LASIX) 40 MG tablet Take 1 tablet (40 mg total) by mouth 2 (two) times daily. 180 tablet 1  . hydrALAZINE (APRESOLINE) 25 MG tablet Take 0.5 tablet (12.5 mg) twice daily if your systolic blood pressure (top number) is consistently greater than 140 (Patient taking differently: Take 25 mg by mouth 2 (two) times daily. Take 0.5 tablet (12.5 mg) twice daily if your systolic blood pressure (top number) is consistently greater than 140) 60 tablet 3  . levothyroxine (SYNTHROID, LEVOTHROID) 50 MCG tablet Take 50 mcg by mouth daily at 6 (six) AM.     . metoprolol (LOPRESSOR) 50 MG tablet Take 50 mg by mouth 2 (two) times daily.     . Multiple Vitamins-Minerals  (MULTI-VITAMIN GUMMIES PO) Take 1 tablet by mouth daily.    . pantoprazole (PROTONIX) 40 MG tablet Take 40 mg by mouth daily.     . potassium chloride SA (K-DUR,KLOR-CON) 20 MEQ tablet Take 20 mEq by mouth daily.        HOSPITAL MEDICATIONS . aspirin  325 mg Per Tube Daily  . atorvastatin  20 mg Per Tube q1800  . chlorhexidine  15 mL Mouth Rinse BID  . clopidogrel  75 mg Per Tube Daily  . donepezil  5 mg Per Tube Daily  . enoxaparin (LOVENOX) injection  40 mg Subcutaneous Q24H  . free water  200 mL Per Tube Q6H  . furosemide  40 mg Per Tube BID  . levothyroxine  50 mcg Per Tube Q0600  . mouth rinse  15 mL Mouth Rinse q12n4p  . metoprolol tartrate  50 mg Per Tube BID  . potassium chloride  20 mEq Per Tube Daily  . QUEtiapine  25 mg Per Tube QHS    LABORATORY STUDIES CBC    Component Value Date/Time   WBC 7.5 09/17/2019 0321   RBC 3.50 (L) 09/17/2019 0321   HGB 10.8 (L) 09/17/2019 0321   HCT 32.4 (L) 09/17/2019 0321   PLT 171 09/17/2019 0321   MCV 92.6 09/17/2019 0321   MCH 30.9 09/17/2019 0321   MCHC 33.3 09/17/2019 0321   RDW 12.8 09/17/2019 0321   LYMPHSABS 0.6 (L) 09/12/2019 0507   MONOABS 1.0 09/12/2019 0507   EOSABS 0.1 09/12/2019 0507   BASOSABS 0.1 09/12/2019 0507   CMP    Component Value Date/Time   NA 141 09/17/2019 0321   NA 144 07/18/2019 1624   K 3.7 09/17/2019 0321   CL 106 09/17/2019 0321   CO2 27 09/17/2019 0321   GLUCOSE 163 (H) 09/17/2019 0321   BUN 15 09/17/2019 0321   BUN 25 07/18/2019 1624   CREATININE 0.86 09/17/2019 0321   CALCIUM 8.5 (L) 09/17/2019 0321   PROT 5.7 (  L) 09/11/2019 0817   ALBUMIN 3.0 (L) 09/11/2019 0817   AST 22 09/11/2019 0817   ALT 16 09/11/2019 0817   ALKPHOS 77 09/11/2019 0817   BILITOT 0.8 09/11/2019 0817   GFRNONAA >60 09/17/2019 0321   GFRAA >60 09/17/2019 0321   COAGS Lab Results  Component Value Date   INR 1.0 09/11/2019   INR 1.08 09/03/2013   Lipid Panel    Component Value Date/Time   CHOL 116  09/12/2019 0507   TRIG 169 (H) 09/12/2019 0507   HDL 39 (L) 09/12/2019 0507   CHOLHDL 3.0 09/12/2019 0507   VLDL 34 09/12/2019 0507   LDLCALC 43 09/12/2019 0507   HgbA1C  Lab Results  Component Value Date   HGBA1C 6.7 (H) 09/12/2019    SIGNIFICANT DIAGNOSTIC STUDIES Ct Head Code Stroke Wo Contrast 09/11/2019 IMPRESSION:  1. Hyperdense distal left M1 segment suggesting acute thrombosis. No hemorrhage or visible acute infarct. There has been a remote moderate left MCA branch infarct at the lateral frontal lobe.  2. Advanced chronic small vessel ischemia in the cerebral white matter. There is also been remote right occipital infarct.   Ct Code Stroke Cta Head W/wo Contrast Ct Code Stroke Cta Neck W/wo Contrast Ct Code Stroke Cta Cerebral Perfusion W/wo Contrast 09/11/2019 IMPRESSION:  1. 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 There has been a remote moderate left lateral frontal and upper insular infarct; CT perfusion maps show 38 cc of completed acute infarct and 56 cc of superimposed penumbra.  2. Limited atherosclerosis for age. No underlying flow limiting stenosis or embolic source identified.   [place]  Ir Percutaneous Art Thrombectomy/infusion Intracranial Inc Diag Angio 09/12/2019 IMPRESSION:  Status post endovascular complete revascularization of occluded left middle cerebral artery M1 segment with 1 pass with the Embotrap 5 mm x 33 mm retrieval device, and 1 pass with the Solitaire 4 mm x 40 mm X retrieval device, and adjunct Penumbra aspiration achieving a TICI 3 revascularization.  PLAN: Follow-up in clinic 4 weeks post discharge.   Mr Brain Wo Contrast 09/12/2019 IMPRESSION:  1. Acute cortical infarct involving the majority of the left MCA distribution, sparing the upper perirolandic cortex and the superficial left temporal lobe. There is patchy petechial hemorrhage.  2. Remote left MCA and right  PCA branch infarcts. Extensive chronic small vessel ischemia.  Transthoracic Echocardiogram  09/12/2019 1. The patient refused the complete study. Globally EF appears normal ~65%. RV functional appears globally normal. Unable to provide much more information than that on this limited study. 2. Left ventricular ejection fraction, by visual estimation, is 60 to 65%. The left ventricle has normal function. There is mildly increased left ventricular hypertrophy. 3. Left ventricular diastolic parameters are consistent with Grade I diastolic dysfunction (impaired relaxation). 4. Global right ventricle has normal systolic function.The right ventricular size is normal. No increase in right ventricular wall thickness. 5. Left atrial size was not assessed. 6. Right atrial size was not assessed. 7. Presence of pericardial fat pad. 8. Mild mitral annular calcification. 9. The mitral valve was not assessed. not assessed mitral valve regurgitation. 10. The tricuspid valve is not assessed. Tricuspid valve regurgitation not assessed. 11. The aortic valve was not assessed. Aortic valve regurgitation is mild. 12. The pulmonic valve was not assessed. Pulmonic valve regurgitation not assessed. 13. Aortic root could not be assessed. 14. The interatrial septum was not assessed.  Vas [place] Lower Extremity Venous (dvt) 09/14/2019 Summary:  Right: There is no evidence of deep vein thrombosis in the lower extremity.  Left: There is no evidence of deep vein thrombosis in the lower extremity. Final    ECG - SR rate 70 BPM. (See cardiology reading for complete details)    HISTORY OF PRESENT ILLNESS (from H&P by Dr Lorraine Lax 09/11/2019) April Le is a 78 y.o. female with past medical history significant for congestive heart failure(chronic diastolic heart failure), prior stroke, hypertension, hyperlipidemia and prior stroke with mild left leg weakness and mild aphasia" emergency department as a code stroke  for left gaze deviation, right-sided weakness and aphasia. Was normal 11:30 PM last night when she went to bed.  Husband tried to wake her up this morning patient was noted to be nonverbal, facial droop and on the right side and he called EMS.   On arrival NIH stroke scale was performed and was 22.  Stat CT head was obtained which showed no acute hemorrhage/infarct.  CT head did demonstrate old infarcts most notably left MCA stroke and R PCA stroke.  CT angiogram and CT perfusion was delayed due to difficulty getting IV line, however CT angiogram did show left M1 occlusion and CT perfusion showed a 38 cc core with 56 cc penumbra. Baseline modified Rankin score was 3, patient able to shower on her own and use the bathroom, watches TV and speaks in sentences however has difficulty reading and writing and walks with a cane. Date last known well: 10.28 or 20 Time last known well: 11:30 PM tPA Given: No, outside TPA window NIHSS: 22 Baseline MRS [place] Ms. Reesheda Roling is a 78 y.o. female with history of significant for congestive heart failure(chronic diastolic heart failure),prior stroke, hypertension, hyperlipidemia and prior stroke with mild left leg weakness and mild aphasia presents to the emergency department as a code stroke for left gaze deviation, right-sided weakness and aphasiasecondary to a left MCA stroke. Out of the tPA window. Sent to IR for L M1 occlusion.   Stroke:  left MCA acute infarct due to left M1 occlusions/p IR with TICI 3 revascularization w/ resultant petechial hemorrhage, embolic pattern, source unclear.   Code Stroke CT Head - hyperdense L M1. No acute infarct. Old L MCA infarct L lateral frontal lobe. Old R occipital infarct.  CTA H&N - left M1 occlusion into M2 branches.  CT Perfusion - 38 cc core with 56 cc penumbra.  Cerebral angio occlusion L MCA M1 w/ TICI3 revascularization  Post IR CT no ICH   MRI L MCA cortical infarct, patchy petechial  hemorrhage. Old L MCA and R PCA infarcts   2D Echo - EF 65%%. No source of embolus   LE venous Doppler no DVT  10/2017 30-day cardiac event monitor negative for A. Fib  Loop recorder placed 09/16/2019 to further rule out A. fib  LDL 43   HgbA1c 5.7   VTE prophylaxis -  Lovenox 40 mg sq daily     ASA 81mg  + Plavix  prior to admission, now on aspirin 325 and Plavix. Continue at d/c.  Therapy recommendations:  CIR  Disposition:  CIR  Hx stroke/TIA ? 10/2017 30-day Cardiac event monitor no A. fib ? 2016 seen by [name] in the office - resultant gait d/o and dysarthria due to extensive small vessel disease, no acute infarct on MRI. On Aggrenox ? 08/2009 - pt reported stroke, details not available in EPIC ? 02/2007 - pt reported stroke, details not available in EPIC  Hypertension  Home BP meds: Apresoline, lasix, lopressor  Current BP meds: Lopressor and Lasix  BP goal to < 180  BP stable     Long-term BP goal normotensive  Hyperlipidemia  Home Lipid lowering medication: Lipitor 40  LDL 43, goal < 70  Current lipid lowering medication: Lipitor 20 mg daily - will not do a higher intensity statin give low LDL and age    Continue statin at discharge  Dysphagia Malnutrition  Secondary to stroke  Failed bedside swallow - NPO  Speech on board  Did not pass MBSS  cortrak clogged - replaced 11/2    On TF @ 55 and free water 200 Q6      Sundowning   Consecutive for 3 nights  Received haldol during admission  On seroquel 25mg  Qhs - responding well   Pulmonary fibrosis   SOB   RR 26 and O2 sat 97%  Copious oral secretions.   SOB improved w/ suctioning.   Aspiration not suspected.   CXR yest w/ vascular congestion.    Repeat CXR today no acute abnormality  d/c IVF, give additional lasix  EKG sinus brady  troponin normal.  Other Stroke Risk Factors  Advanced age  Chronic diastolic congestive Heart Failure  Other Active  Problems  Leukocytosis 10.6->7.8->7.2->7.5->7.5    Acute blood loss anemia 12.9-11.8-11.1->10.2->9.9->10.8->10.8   Mild thrombocytopenia - 138->180->197->171  Asymptomatic run VT x 5      DISCHARGE EXAM Vitals:   09/17/19 0505 09/17/19 0832 09/17/19 1135 09/17/19 1551  BP: 132/77 (!) 145/122 (!) 160/59 (!) 160/65  Pulse: 61 76 (!) 51 (!) 58  Resp: 20 20 20 20   Temp: 98.4 F (36.9 C) 100.3 F (37.9 C) 99.4 F (37.4 C) 99.1 F (37.3 C)  TempSrc: Oral Oral Oral Oral  SpO2: 95% 90% 95% 92%  Weight:      Height:       General - Well nourished, well developed, in no apparent distress.  Ophthalmologic - fundi not visualized due to noncooperation.  Cardiovascular - Regular rhythm and rate.  Neuro -awake, alert, following central commands but not peripheral commands but able to mimic or pantomime. word salad.  No gaze preference, attending to both sides, EOMI, PERRL.  Blinking to visual threat bilaterally.  Significant right facial droop.  Tongue protrusion not corporative.  Moving all extremities symmetrically and against gravity.  DTR 1+, no Babinski. Sensation, coordination and gait not tested.  Discharge Diet   NPO, on tube feedings   DISCHARGE PLAN  Disposition:  CIR  aspirin 325 mg daily and clopidogrel 75 mg daily for secondary stroke prevention. Continue at d/c   Ongoing risk factor control by Primary Care Physician at time of discharge from [place], Lynley S, MD in 2 weeks after discharge from [place] in [facility] in 4 weeks after d/c from CIR, office to schedule an appointment.  Follow-up [facility] for implantable loop recorder wound check in 10-14 days, office to schedule an appointment.  Loop recorder to be monitored by [facility] for atrial fibrillation as source of stroke. If found, they will notify patient.  40 minutes were spent preparing discharge.  Rosalin Hawking, MD PhD Stroke  Neurology 09/17/2019 6:28 PM

## 2019-09-15 NOTE — H&P (View-Only) (Signed)
STROKE TEAM PROGRESS NOTE   INTERVAL HISTORY Her daughter is at bedside. Pt still has largely global aphasia, but moving all extremities. PT/OT recommend CIR. Pt cortrak is clogged, not able to open. Will stop TF and put on IVF. Needs cortrak change tomorrow or passing swallow eval tomorrow. Will do loop recorder on discharge. Had sundowning overnight and received haldol x 3.   OBJECTIVE Vitals:   09/15/19 0013 09/15/19 0416 09/15/19 0500 09/15/19 0738  BP: (!) 151/56 (!) 157/63  (!) 159/67  Pulse: (!) 57 (!) 57  62  Resp: 20 20  20   Temp: 98.3 F (36.8 C) 98.4 F (36.9 C)  97.6 F (36.4 C)  TempSrc: Oral Oral  Oral  SpO2: 98% 100%  93%  Weight:   76.3 kg   Height:        CBC:  Recent Labs  Lab 09/11/19 0817 09/12/19 0507 09/14/19 0312 09/15/19 0426  WBC 10.7* 10.6* 7.8 7.2  NEUTROABS 8.8* 8.8*  --   --   HGB 11.8* 11.1* 10.2* 9.9*  HCT 35.8* 33.1* 30.0* 29.5*  MCV 93.0 92.5 91.7 93.1  PLT 220 140* 131* [id number]    Basic Metabolic Panel:  Recent Labs  Lab 09/14/19 0312 09/15/19 0426  NA 144 141  K 3.5 3.8  CL 112* 108  CO2 23 25  GLUCOSE 172* 147*  BUN 10 15  CREATININE 0.81 0.91  CALCIUM 8.4* 8.5*    Lipid Panel:     Component Value Date/Time   CHOL 116 09/12/2019 0507   TRIG 169 (H) 09/12/2019 0507   HDL 39 (L) 09/12/2019 0507   CHOLHDL 3.0 09/12/2019 0507   VLDL 34 09/12/2019 0507   LDLCALC 43 09/12/2019 0507   HgbA1c:  Lab Results  Component Value Date   HGBA1C 6.7 (H) 09/12/2019   Urine Drug Screen: No results found for: LABOPIA, COCAINSCRNUR, LABBENZ, AMPHETMU, THCU, LABBARB  Alcohol Level No results found for: ETH  IMAGING  Ct Code Stroke Cta Head W/wo Contrast Ct Code Stroke Cta Neck W/wo Contrast Ct Code Stroke Cta Cerebral Perfusion W/wo Contrast 09/11/2019 IMPRESSION:  1. 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 There has been a remote moderate left lateral frontal and upper  insular infarct; CT perfusion maps show 38 cc of completed acute infarct and 56 cc of superimposed penumbra.  2. Limited atherosclerosis for age. No underlying flow limiting stenosis or embolic source identified.   Mr Brain Wo Contrast 09/12/2019 IMPRESSION:  1. Acute cortical infarct involving the majority of the left MCA distribution, sparing the upper perirolandic cortex and the superficial left temporal lobe. There is patchy petechial hemorrhage.  2. 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 Extensive chronic small vessel ischemia.   [place]  Ir Percutaneous Art Thrombectomy/infusion Intracranial Inc Diag Angio 09/12/2019 IMPRESSION:  Status post endovascular complete revascularization of occluded left middle cerebral artery M1 segment with 1 pass with the Embotrap 5 mm x 33 mm retrieval device, and 1 pass with the Solitaire 4 mm x 40 mm X retrieval device, and adjunct Penumbra aspiration achieving a TICI 3 revascularization.  PLAN: Follow-up in clinic 4 weeks post discharge.    Ct Head Code Stroke Wo Contrast 09/11/2019 IMPRESSION:  1. Hyperdense distal left M1 segment suggesting acute thrombosis. No hemorrhage or visible acute infarct. There has been a remote moderate left MCA branch infarct at the lateral frontal lobe.  2. Advanced chronic small vessel ischemia in  the cerebral white matter. There is also been remote right occipital infarct.    Vas [place] Lower Extremity Venous (dvt) 09/14/2019 Summary:  Right: There is no evidence of deep vein thrombosis in the lower extremity.  Left: There is no evidence of deep vein thrombosis in the lower extremity. Final     Cerebral Angio S/P Lt common carotid arteriogram followed by complete revascularization of occluded Lt LMCA M 1 seg with x 1 pass with the embotrap 66mm x 21mm retriver device and x 1 pass with the solitaire 3mmx 40 mmX retriever device achieving a TICI 3 revascularization.  Transthoracic Echocardiogram   09/12/2019  1. The patient refused the complete study. Globally EF appears normal ~65%. RV functional appears globally normal. Unable to provide much more information than that on this limited study.  2. Left ventricular ejection fraction, by visual estimation, is 60 to 65%. The left ventricle has normal function. There is mildly increased left ventricular hypertrophy.  3. Left ventricular diastolic parameters are consistent with Grade I diastolic dysfunction (impaired relaxation).  4. Global right ventricle has normal systolic function.The right ventricular size is normal. No increase in right ventricular wall thickness.  5. Left atrial size was not assessed.  6. Right atrial size was not assessed.  7. Presence of pericardial fat pad.  8. Mild mitral annular calcification.  9. The mitral valve was not assessed. not assessed mitral valve regurgitation. 10. The tricuspid valve is not assessed. Tricuspid valve regurgitation not assessed. 11. The aortic valve was not assessed. Aortic valve regurgitation is mild. 12. The pulmonic valve was not assessed. Pulmonic valve regurgitation not assessed. 13. Aortic root could not be assessed. 14. The interatrial septum was not assessed.  ECG - SR rate 70 BPM. (See cardiology reading for complete details)  PHYSICAL EXAM    Temp:  [97.6 F (36.4 C)-99.6 F (37.6 C)] 97.6 F (36.4 C) (11/01 0738) Pulse Rate:  [57-68] 62 (11/01 0738) Resp:  [18-20] 20 (11/01 0738) BP: (151-175)/(56-78) 159/67 (11/01 0738) SpO2:  [93 %-100 %] 93 % (11/01 0738) Weight:  [76.3 kg] 76.3 kg (11/01 0500)  General - Well nourished, well developed, in no apparent distress.  Ophthalmologic - fundi not visualized due to noncooperation.  Cardiovascular - Regular rhythm and rate.  Neuro -awake, alert, largely global aphasia except able to close eyes and open mouth as requested.  Not follow other simple commands and word salad.  No gaze preference, attending to both sides,  EOMI, PERRL.  Blinking to visual threat bilaterally.  Significant right facial droop.  Tongue protrusion not corporative.  Moving all extremities symmetrically and against gravity.  DTR 1+, no Babinski. Sensation, coordination and gait not tested.    ASSESSMENT/PLAN [name] is a 78 y.o. female with history of significant for congestive heart failure(chronic diastolic heart failure), prior stroke, hypertension, hyperlipidemia and prior stroke with mild left leg weakness and mild aphasia" emergency department as a code stroke for left gaze deviation, right-sided weakness and aphasia secondary to left MCA stroke. Out of the tPA window. Sent to IR for L M1 occlusion.   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   Code Stroke CT Head - hyperdense L M1. No acute infarct. Old L MCA infarct L lateral frontal lobe. Old R occipital infarct.  CTA H&N - left M1 occlusion into M2 branches.  CT Perfusion - 38 cc core with  56 cc penumbra.  Cerebral angio occlusion L MCA M1 w/ TICI3 revascularization  MRI L MCA cortical infarct, patchy petechial hemorrhage. Old L MCA and R PCA infarcts   2D Echo - EF 65%%. No source of embolus   LE venous Doppler no DVT  10/2017 30-day cardiac event monitor negative for A. Fib  Will recommend loop recorder to further rule out A. fib  LDL 43   HgbA1c 5.7   VTE prophylaxis -  Lovenox 40 mg sq daily     ASA 81mg  + Plavix  prior to admission, now on aspirin 325 and Plavix DAPT.  Therapy recommendations:  CIR  Disposition:  Pending  Hx stroke/TIA  10/2017 30-day Cardiac event monitor no A. fib  2016 seen by [name] in the office - resultant gait d/o and dysarthria due to extensive small vessel disease, no acute infarct on MRI. On Aggrenox  08/2009 - pt reported stroke, details not available [facility]  02/2007 - pt reported stroke,  details not available in EPIC  Hypertension  Home BP meds: Apresoline, lasix, lopressor  Current BP meds: Lopressor and Lasix . BP goal to < 180 . BP stable    . Long-term BP goal normotensive  Hyperlipidemia  Home Lipid lowering medication: Lipitor 40  LDL 43, goal < 70  Current lipid lowering medication: Lipitor 20 mg daily   Continue statin at discharge  Dysphagia . Secondary to stroke . Failed bedside swallow - NPO . Speech on board . Place cortrak -> no cortrak clogged . Hold off TF and will put on IVF . Consider replace cortrak or pass swallow in am   Sundowning   Consecutive for 2 nights  Received haldol x 3 last night  Will start seroquel 25mg  Qhs.   Other Stroke Risk Factors  Advanced age  Congestive Heart Failure  Other Active Problems  Leukocytosis 10.6->7.8->7.2  Acute blood loss anemia 12.9-11.8-11.1->10.2->9.9  Pulmonary fibrosis    Mild thrombocytopenia - 138->180   Hospital day # 4  I had long discussion with daughter at bedside, updated pt current condition, treatment plan and potential prognosis, and answered all the questions. She expressed understanding and appreciation.   Rosalin Hawking, MD PhD Stroke Neurology 09/15/2019 3:42 PM   To contact Stroke Continuity provider, please refer to [email or web address] After hours, contact General Neurology

## 2019-09-15 NOTE — Progress Notes (Signed)
Patient Cortrak tubing clogged. Nurse attempted to flush with warm water, nurse also tried Viokace but was unsuccessful. MD paged and made aware. Nurse given permission to hold evening medications by MD. Nurse will continue to monitor and notify night shift nurse of changes. Gwendolyn Grant, RN

## 2019-09-15 NOTE — Progress Notes (Signed)
Pt continue to be irritated and keeps on pulling on lines and trying to get out of bed. MD on-call notified and new order received as pt NPO and no other route for her Seroquel. Delia Heady RN

## 2019-09-15 NOTE — Progress Notes (Addendum)
STROKE TEAM PROGRESS NOTE   INTERVAL HISTORY Her daughter is at bedside. Pt still has largely global aphasia, but moving all extremities. PT/OT recommend CIR. Pt cortrak is clogged, not able to open. Will stop TF and put on IVF. Needs cortrak change tomorrow or passing swallow eval tomorrow. Will do loop recorder on discharge. Had sundowning overnight and received haldol x 3.   OBJECTIVE Vitals:   09/15/19 0013 09/15/19 0416 09/15/19 0500 09/15/19 0738  BP: (!) 151/56 (!) 157/63  (!) 159/67  Pulse: (!) 57 (!) 57  62  Resp: 20 20  20   Temp: 98.3 F (36.8 C) 98.4 F (36.9 C)  97.6 F (36.4 C)  TempSrc: Oral Oral  Oral  SpO2: 98% 100%  93%  Weight:   76.3 kg   Height:        CBC:  Recent Labs  Lab 09/11/19 0817 09/12/19 0507 09/14/19 0312 09/15/19 0426  WBC 10.7* 10.6* 7.8 7.2  NEUTROABS 8.8* 8.8*  --   --   HGB 11.8* 11.1* 10.2* 9.9*  HCT 35.8* 33.1* 30.0* 29.5*  MCV 93.0 92.5 91.7 93.1  PLT 220 140* 131* [id number]    Basic Metabolic Panel:  Recent Labs  Lab 09/14/19 0312 09/15/19 0426  NA 144 141  K 3.5 3.8  CL 112* 108  CO2 23 25  GLUCOSE 172* 147*  BUN 10 15  CREATININE 0.81 0.91  CALCIUM 8.4* 8.5*    Lipid Panel:     Component Value Date/Time   CHOL 116 09/12/2019 0507   TRIG 169 (H) 09/12/2019 0507   HDL 39 (L) 09/12/2019 0507   CHOLHDL 3.0 09/12/2019 0507   VLDL 34 09/12/2019 0507   LDLCALC 43 09/12/2019 0507   HgbA1c:  Lab Results  Component Value Date   HGBA1C 6.7 (H) 09/12/2019   Urine Drug Screen: No results found for: LABOPIA, COCAINSCRNUR, LABBENZ, AMPHETMU, THCU, LABBARB  Alcohol Level No results found for: ETH  IMAGING  Ct Code Stroke Cta Head W/wo Contrast Ct Code Stroke Cta Neck W/wo Contrast Ct Code Stroke Cta Cerebral Perfusion W/wo Contrast 09/11/2019 IMPRESSION:  1. 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 There has been a remote moderate left lateral frontal and upper  insular infarct; CT perfusion maps show 38 cc of completed acute infarct and 56 cc of superimposed penumbra.  2. Limited atherosclerosis for age. No underlying flow limiting stenosis or embolic source identified.   Mr Brain Wo Contrast 09/12/2019 IMPRESSION:  1. Acute cortical infarct involving the majority of the left MCA distribution, sparing the upper perirolandic cortex and the superficial left temporal lobe. There is patchy petechial hemorrhage.  2. 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 Extensive chronic small vessel ischemia.   [place]  Ir Percutaneous Art Thrombectomy/infusion Intracranial Inc Diag Angio 09/12/2019 IMPRESSION:  Status post endovascular complete revascularization of occluded left middle cerebral artery M1 segment with 1 pass with the Embotrap 5 mm x 33 mm retrieval device, and 1 pass with the Solitaire 4 mm x 40 mm X retrieval device, and adjunct Penumbra aspiration achieving a TICI 3 revascularization.  PLAN: Follow-up in clinic 4 weeks post discharge.    Ct Head Code Stroke Wo Contrast 09/11/2019 IMPRESSION:  1. Hyperdense distal left M1 segment suggesting acute thrombosis. No hemorrhage or visible acute infarct. There has been a remote moderate left MCA branch infarct at the lateral frontal lobe.  2. Advanced chronic small vessel ischemia in  the cerebral white matter. There is also been remote right occipital infarct.    Vas [place] Lower Extremity Venous (dvt) 09/14/2019 Summary:  Right: There is no evidence of deep vein thrombosis in the lower extremity.  Left: There is no evidence of deep vein thrombosis in the lower extremity. Final     Cerebral Angio S/P Lt common carotid arteriogram followed by complete revascularization of occluded Lt LMCA M 1 seg with x 1 pass with the embotrap 25mm x 88mm retriver device and x 1 pass with the solitaire 54mmx 40 mmX retriever device achieving a TICI 3 revascularization.  Transthoracic Echocardiogram   09/12/2019  1. The patient refused the complete study. Globally EF appears normal ~65%. RV functional appears globally normal. Unable to provide much more information than that on this limited study.  2. Left ventricular ejection fraction, by visual estimation, is 60 to 65%. The left ventricle has normal function. There is mildly increased left ventricular hypertrophy.  3. Left ventricular diastolic parameters are consistent with Grade I diastolic dysfunction (impaired relaxation).  4. Global right ventricle has normal systolic function.The right ventricular size is normal. No increase in right ventricular wall thickness.  5. Left atrial size was not assessed.  6. Right atrial size was not assessed.  7. Presence of pericardial fat pad.  8. Mild mitral annular calcification.  9. The mitral valve was not assessed. not assessed mitral valve regurgitation. 10. The tricuspid valve is not assessed. Tricuspid valve regurgitation not assessed. 11. The aortic valve was not assessed. Aortic valve regurgitation is mild. 12. The pulmonic valve was not assessed. Pulmonic valve regurgitation not assessed. 13. Aortic root could not be assessed. 14. The interatrial septum was not assessed.  ECG - SR rate 70 BPM. (See cardiology reading for complete details)  PHYSICAL EXAM    Temp:  [97.6 F (36.4 C)-99.6 F (37.6 C)] 97.6 F (36.4 C) (11/01 0738) Pulse Rate:  [57-68] 62 (11/01 0738) Resp:  [18-20] 20 (11/01 0738) BP: (151-175)/(56-78) 159/67 (11/01 0738) SpO2:  [93 %-100 %] 93 % (11/01 0738) Weight:  [76.3 kg] 76.3 kg (11/01 0500)  General - Well nourished, well developed, in no apparent distress.  Ophthalmologic - fundi not visualized due to noncooperation.  Cardiovascular - Regular rhythm and rate.  Neuro -awake, alert, largely global aphasia except able to close eyes and open mouth as requested.  Not follow other simple commands and word salad.  No gaze preference, attending to both sides,  EOMI, PERRL.  Blinking to visual threat bilaterally.  Significant right facial droop.  Tongue protrusion not corporative.  Moving all extremities symmetrically and against gravity.  DTR 1+, no Babinski. Sensation, coordination and gait not tested.    ASSESSMENT/PLAN Ms. Lakeisa Towns is a 78 y.o. female with history of significant for congestive heart failure(chronic diastolic heart failure), prior stroke, hypertension, hyperlipidemia and prior stroke with mild left leg weakness and mild aphasia" emergency department as a code stroke for left gaze deviation, right-sided weakness and aphasia secondary to left MCA stroke. Out of the tPA window. Sent to IR for L M1 occlusion.   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   Code Stroke CT Head - hyperdense L M1. No acute infarct. Old L MCA infarct L lateral frontal lobe. Old R occipital infarct.  CTA H&N - left M1 occlusion into M2 branches.  CT Perfusion - 38 cc core with  56 cc penumbra.  Cerebral angio occlusion L MCA M1 w/ TICI3 revascularization  MRI L MCA cortical infarct, patchy petechial hemorrhage. Old L MCA and R PCA infarcts   2D Echo - EF 65%%. No source of embolus   LE venous Doppler no DVT  10/2017 30-day cardiac event monitor negative for A. Fib  Will recommend loop recorder to further rule out A. fib  LDL 43   HgbA1c 5.7   VTE prophylaxis -  Lovenox 40 mg sq daily     ASA 81mg  + Plavix  prior to admission, now on aspirin 325 and Plavix DAPT.  Therapy recommendations:  CIR  Disposition:  Pending  Hx stroke/TIA  10/2017 30-day Cardiac event monitor no A. fib  2016 seen by [name] in the office - resultant gait d/o and dysarthria due to extensive small vessel disease, no acute infarct on MRI. On Aggrenox  08/2009 - pt reported stroke, details [facility]  02/2007 - pt reported stroke,  details not available in EPIC  Hypertension  Home BP meds: Apresoline, lasix, lopressor  Current BP meds: Lopressor and Lasix . BP goal to < 180 . BP stable    . Long-term BP goal normotensive  Hyperlipidemia  Home Lipid lowering medication: Lipitor 40  LDL 43, goal < 70  Current lipid lowering medication: Lipitor 20 mg daily   Continue statin at discharge  Dysphagia . Secondary to stroke . Failed bedside swallow - NPO . Speech on board . Place cortrak -> no cortrak clogged . Hold off TF and will put on IVF . Consider replace cortrak or pass swallow in am   Sundowning   Consecutive for 2 nights  Received haldol x 3 last night  Will start seroquel 25mg  Qhs.   Other Stroke Risk Factors  Advanced age  Congestive Heart Failure  Other Active Problems  Leukocytosis 10.6->7.8->7.2  Acute blood loss anemia 12.9-11.8-11.1->10.2->9.9  Pulmonary fibrosis    Mild thrombocytopenia - 138->180   Hospital day # 4  I had long discussion with daughter at bedside, updated pt current condition, treatment plan and potential prognosis, and answered all the questions. She expressed understanding and appreciation.   Rosalin Hawking, MD PhD Stroke Neurology 09/15/2019 3:42 PM   To contact Stroke Continuity provider, please refer to [email or web address] After hours, contact General Neurology

## 2019-09-15 NOTE — Progress Notes (Signed)
RN attempted to flush pt corktrak but unsuccessful. Pt noted to be pulling on corktrak; pt irritated with it. Corktrak removed. Francis Gaines Miller [place] RN.

## 2019-09-16 ENCOUNTER — Encounter (HOSPITAL_COMMUNITY)
Admission: EM | Disposition: A | Discharge status: Rehabilitation Facility | Payer: Self-pay | Source: Home / Self Care | Attending: Neurology

## 2019-09-16 ENCOUNTER — Other Ambulatory Visit (HOSPITAL_COMMUNITY): Payer: Medicare Other

## 2019-09-16 ENCOUNTER — Inpatient Hospital Stay (HOSPITAL_COMMUNITY): Payer: Medicare Other

## 2019-09-16 DIAGNOSIS — Z8673 Personal history of transient ischemic attack (TIA), and cerebral infarction without residual deficits: Secondary | ICD-10-CM

## 2019-09-16 DIAGNOSIS — I639 Cerebral infarction, unspecified: Secondary | ICD-10-CM

## 2019-09-16 DIAGNOSIS — I5032 Chronic diastolic (congestive) heart failure: Secondary | ICD-10-CM

## 2019-09-16 DIAGNOSIS — Z4659 Encounter for fitting and adjustment of other gastrointestinal appliance and device: Secondary | ICD-10-CM

## 2019-09-16 DIAGNOSIS — D696 Thrombocytopenia, unspecified: Secondary | ICD-10-CM | POA: Diagnosis present

## 2019-09-16 DIAGNOSIS — I69391 Dysphagia following cerebral infarction: Secondary | ICD-10-CM

## 2019-09-16 DIAGNOSIS — D62 Acute posthemorrhagic anemia: Secondary | ICD-10-CM | POA: Diagnosis not present

## 2019-09-16 DIAGNOSIS — E7800 Pure hypercholesterolemia, unspecified: Secondary | ICD-10-CM

## 2019-09-16 DIAGNOSIS — E46 Unspecified protein-calorie malnutrition: Secondary | ICD-10-CM | POA: Diagnosis not present

## 2019-09-16 HISTORY — PX: LOOP RECORDER INSERTION: EP1214

## 2019-09-16 LAB — BASIC METABOLIC PANEL
Anion gap: 9 (ref 5–15)
BUN: 13 mg/dL (ref 8–23)
CO2: 27 mmol/L (ref 22–32)
Calcium: 8.7 mg/dL — ABNORMAL LOW (ref 8.9–10.3)
Chloride: 105 mmol/L (ref 98–111)
Creatinine, Ser: 0.96 mg/dL (ref 0.44–1.00)
GFR calc Af Amer: >60 mL/min (ref >60)
GFR calc non Af Amer: 56 mL/min — ABNORMAL LOW (ref >60)
Glucose, Bld: 113 mg/dL — ABNORMAL HIGH (ref 70–99)
Potassium: 3.7 mmol/L (ref 3.5–5.1)
Sodium: 141 mmol/L (ref 135–145)

## 2019-09-16 LAB — GLUCOSE, CAPILLARY
Glucose-Capillary: 108 mg/dL — ABNORMAL HIGH (ref 70–99)
Glucose-Capillary: 120 mg/dL — ABNORMAL HIGH (ref 70–99)
Glucose-Capillary: 155 mg/dL — ABNORMAL HIGH (ref 70–99)
Glucose-Capillary: 109 mg/dL — ABNORMAL HIGH (ref 70–99)
Glucose-Capillary: 149 mg/dL — ABNORMAL HIGH (ref 70–99)
Glucose-Capillary: 150 mg/dL — ABNORMAL HIGH (ref 70–99)

## 2019-09-16 LAB — CBC
HCT: 31.6 % — ABNORMAL LOW (ref 36.0–46.0)
Hemoglobin: 10.8 g/dL — ABNORMAL LOW (ref 12.0–15.0)
MCH: 30.9 pg (ref 26.0–34.0)
MCHC: 34.2 g/dL (ref 30.0–36.0)
MCV: 90.5 fL (ref 80.0–100.0)
Platelets: 197 10*3/uL (ref 150–400)
RBC: 3.49 MIL/uL — ABNORMAL LOW (ref 3.87–5.11)
RDW: 12.5 % (ref 11.5–15.5)
WBC: 7.5 10*3/uL (ref 4.0–10.5)
nRBC: 0 % (ref 0.0–0.2)

## 2019-09-16 SURGERY — ECHOCARDIOGRAM, TRANSESOPHAGEAL
Anesthesia: Monitor Anesthesia Care

## 2019-09-16 SURGERY — LOOP RECORDER INSERTION

## 2019-09-16 MED ORDER — LIDOCAINE-EPINEPHRINE 1 %-1:100000 IJ SOLN
INTRAMUSCULAR | Status: DC | PRN
  Administered 2019-09-16: 30 mL

## 2019-09-16 MED ORDER — IOHEXOL 300 MG/ML  SOLN
25.0000 mL | Freq: Once | INTRAMUSCULAR | Status: AC | PRN
  Administered 2019-09-16: 15 mL

## 2019-09-16 MED ORDER — LIDOCAINE-EPINEPHRINE 1 %-1:100000 IJ SOLN
INTRAMUSCULAR | Status: AC
  Filled 2019-09-16: qty 1

## 2019-09-16 MED ORDER — ONDANSETRON HCL 4 MG/2ML IJ SOLN: 4.0000 mg | Freq: Four times a day (QID) | INTRAMUSCULAR | Status: DC | PRN

## 2019-09-16 MED ORDER — LIDOCAINE VISCOUS HCL 2 % MT SOLN
6.0000 mL | Freq: Once | OROMUCOSAL | Status: AC
  Administered 2019-09-16: 6 mL via OROMUCOSAL
  Filled 2019-09-16: qty 15

## 2019-09-16 MED ORDER — FUROSEMIDE 40 MG PO TABS
40.0000 mg | ORAL_TABLET | Freq: Two times a day (BID) | ORAL | Status: DC
  Administered 2019-09-16: 40 mg via ORAL
  Filled 2019-09-16 (×2): qty 1

## 2019-09-16 MED ORDER — ACETAMINOPHEN 325 MG PO TABS: 325.0000 mg | ORAL_TABLET | ORAL | Status: DC | PRN

## 2019-09-16 SURGICAL SUPPLY — 2 items
MONITOR REVEAL LINQ II (Prosthesis & Implant Heart) ×2 IMPLANT
PACK LOOP INSERTION (CUSTOM PROCEDURE TRAY) ×3 IMPLANT

## 2019-09-16 NOTE — Progress Notes (Signed)
Physical Therapy Treatment Patient Details Name: April Le MRN: [id number] DOB: [birth year] Today's Date: 09/16/2019    History of Present Illness April Le is a 78 y.o. female with past medical history significant for congestive heart failure(chronic diastolic heart failure), prior stroke, hypertension, hyperlipidemia and prior stroke with residual mild left leg weakness and mild aphasia. She presented to the emergency department as a code stroke for left gaze deviation, right-sided weakness and aphasia on 09/11/19 when she was found non-verbal with right side weakness and facial droop. Stat CT head was obtained which showed no acute hemorrhage/infarct.  CT head did demonstrate old infarcts most notably left MCA stroke and R PCA stroke.  CT angiogram and CT perfusion showed left M1 occlusion. Intubated briefly for arteriogram and TICI revascularization.      PT Comments    Patient received in bed asleep, easily woken and seemed interested and motivated to participate in therapy today. Able to complete bed mobility with min guard and extended time/encouragement, then MinAx2 to boost to full standing position with HHA, and ModAx2 for safety with gait approximately 82f. Much more fatigued today and per husband had been sleeping most of the day, abruptly turned around in hallway and rushed (with cues for safety and pacing) back to the bed- unable to convince her to perform further ambulation this afternoon. She was left in bed with all needs met, bed alarm active, and spouse present. Continue to strongly recommend CIR moving forward.    Follow Up Recommendations  CIR;Supervision/Assistance - 24 hour     Equipment Recommendations  Other (comment)(defer)    Recommendations for Other Services       Precautions / Restrictions Precautions Precautions: Fall Restrictions Weight Bearing Restrictions: No    Mobility  Bed Mobility Overal bed mobility: Needs Assistance Bed Mobility: Supine to  Sit;Sit to Supine     Supine to sit: Min guard Sit to supine: Min guard   General bed mobility comments: Min guard for safety, cues and encouragement to bring trunk up and scoot to EOB  Transfers Overall transfer level: Needs assistance Equipment used: None Transfers: Sit to/from Stand Sit to Stand: Min assist;+2 physical assistance         General transfer comment: MinAx2 for boost to full standing position, mild posterior bias but able to correct with MinA  Ambulation/Gait Ambulation/Gait assistance: Mod assist;+2 physical assistance Gait Distance (Feet): 40 Feet Assistive device: 2 person hand held assist Gait Pattern/deviations: Step-through pattern;Decreased step length - left;Decreased step length - right;Decreased stride length;Shuffle;Trunk flexed;Drifts right/left;Wide base of support Gait velocity: slow   General Gait Details: wide BOS and ModAx2 for safety and balance; patient frequently letting go of R UE and grabbing for wall, cues to improve step length and balance provided. Fatigued once out in hallway, non-verbally requested return to bed   Stairs             Wheelchair Mobility    Modified Rankin (Stroke Patients Only) Modified Rankin (Stroke Patients Only) Pre-Morbid Rankin Score: Moderate disability Modified Rankin: Moderately severe disability     Balance Overall balance assessment: Needs assistance Sitting-balance support: No upper extremity supported;Feet supported Sitting balance-Leahy Scale: Fair     Standing balance support: Bilateral upper extremity supported;During functional activity Standing balance-Leahy Scale: Poor Standing balance comment: reliant on external support                            Cognition Arousal/Alertness: Awake/alert Behavior During Therapy:  Flat affect Overall Cognitive Status: Impaired/Different from baseline Area of Impairment: Attention;Following commands;Awareness;Memory;Problem solving                    Current Attention Level: Sustained Memory: Decreased recall of precautions;Decreased short-term memory Following Commands: Follows one step commands inconsistently;Follows one step commands with increased time   Awareness: Intellectual Problem Solving: Slow processing;Difficulty sequencing;Requires verbal cues;Requires tactile cues General Comments: ongoing apraxia but improving with gross gait and transfer tasks, some R inattention, did well with gestures. Seemed motivated to get up with PT      Exercises      General Comments General comments (skin integrity, edema, etc.): 2LPM, husband managed O2 tank      Pertinent Vitals/Pain Pain Assessment: Faces Pain Score: 0-No pain Faces Pain Scale: No hurt Pain Intervention(s): Limited activity within patient's tolerance;Monitored during session    Home Living                      Prior Function            PT Goals (current goals can now be found in the care plan section) Acute Rehab PT Goals Patient Stated Goal: pt unable to participate due to aphasia PT Goal Formulation: Patient unable to participate in goal setting Time For Goal Achievement: 09/26/19 Potential to Achieve Goals: Fair Progress towards PT goals: Progressing toward goals    Frequency    Min 4X/week      PT Plan Current plan remains appropriate    Co-evaluation              AM-PAC PT "6 Clicks" Mobility   Outcome Measure  Help needed turning from your back to your side while in a flat bed without using bedrails?: A Little Help needed moving from lying on your back to sitting on the side of a flat bed without using bedrails?: A Little Help needed moving to and from a bed to a chair (including a wheelchair)?: A Lot Help needed standing up from a chair using your arms (e.g., wheelchair or bedside chair)?: A Little Help needed to walk in hospital room?: A Lot Help needed climbing 3-5 steps with a railing? : A Lot 6  Click Score: 15    End of Session Equipment Utilized During Treatment: Gait belt;Oxygen Activity Tolerance: Patient tolerated treatment well Patient left: in bed;with call bell/phone within reach;with bed alarm set;with family/visitor present   PT Visit Diagnosis: Unsteadiness on feet (R26.81);Other symptoms and signs involving the nervous system (R29.898);Difficulty in walking, not elsewhere classified (R26.2)     Time: 2297-9892 PT Time Calculation (min) (ACUTE ONLY): 19 min  Charges:  $Gait Training: 8-22 mins                     Windell Norfolk, DPT, CBIS  Supplemental Physical Therapist [place]    Pager [phone] Acute Rehab Office [phone]

## 2019-09-16 NOTE — Progress Notes (Signed)
Inpatient Rehabilitation Admissions Coordinator  I met with pt's spouse at bedside. Pt having LOOP placement. I will begin insurance approval once I have updated PT note today for a possible inpt rehab admit Tuesday.   , RN, MSN Rehab Admissions Coordinator [phone] 09/16/2019 1:53 PM  

## 2019-09-16 NOTE — Progress Notes (Addendum)
SLP Cancellation Note  Patient Details Name: April Le MRN: [id number] DOB: 09/09/1941   Cancelled treatment:       Reason Eval/Treat Not Completed: Patient NPO for procedure, therefore unable to reassess swallow function and po readiness at this time. Pt is having TEE and Loop placement at 1400 today. RN reports Cortrak had to be removed, and MD requests re-assessment for po readiness prior to FT replacement. SLP will check pt status this afternoon, however, pt may not be appropriate or safe for po trials after these 2 procedures. Will follow.   Celia B. Quentin Ore, [facility], [place] Speech Language Pathologist Office: [phone] Pager: [phone]  Shonna Chock 09/16/2019, 10:20 AM

## 2019-09-16 NOTE — Progress Notes (Signed)
Cortrak Tube Team Note:  Small bore feeding tube placed by diagnostic radiology. RD placed bridle to secure tube.   Mariana Single RD, LDN Clinical Nutrition Pager # [phone]

## 2019-09-16 NOTE — Progress Notes (Signed)
STROKE TEAM PROGRESS NOTE   INTERVAL HISTORY Pt husband is at bedside. Speech therapist also at bedside. Pt swallow improved from last eval, but still need cortrak placement which was done in the pm.  Loop recorder also inserted this pm to evaluate for afib. PT/OT recommend CIR. Pt still has global aphasia, not much change from yesterday.   OBJECTIVE Vitals:   09/16/19 0001 09/16/19 0411 09/16/19 0500 09/16/19 0835  BP: (!) 155/65 (!) 182/78  (!) 170/72  Pulse: (!) 57 67  73  Resp: 20 20  20   Temp: 98.4 F (36.9 C) 98.3 F (36.8 C)  98.5 F (36.9 C)  TempSrc: Oral Oral  Oral  SpO2: 96% 94%  93%  Weight:   72.2 kg   Height:        CBC:  Recent Labs  Lab 09/11/19 0817 09/12/19 0507  09/15/19 0426 09/16/19 0335  WBC 10.7* 10.6*   < > 7.2 7.5  NEUTROABS 8.8* 8.8*  --   --   --   HGB 11.8* 11.1*   < > 9.9* 10.8*  HCT 35.8* 33.1*   < > 29.5* 31.6*  MCV 93.0 92.5   < > 93.1 90.5  PLT 220 140*   < > 180 197   < > = values in this interval not displayed.    Basic Metabolic Panel:  Recent Labs  Lab 09/15/19 0426 09/16/19 0335  NA 141 141  K 3.8 3.7  CL 108 105  CO2 25 27  GLUCOSE 147* 113*  BUN 15 13  CREATININE 0.91 0.96  CALCIUM 8.5* 8.7*    Lipid Panel:     Component Value Date/Time   CHOL 116 09/12/2019 0507   TRIG 169 (H) 09/12/2019 0507   HDL 39 (L) 09/12/2019 0507   CHOLHDL 3.0 09/12/2019 0507   VLDL 34 09/12/2019 0507   LDLCALC 43 09/12/2019 0507   HgbA1c:  Lab Results  Component Value Date   HGBA1C 6.7 (H) 09/12/2019   Urine Drug Screen: No results found for: LABOPIA, COCAINSCRNUR, LABBENZ, AMPHETMU, THCU, LABBARB  Alcohol Level No results found for: ETH  IMAGING  Ct Code Stroke Cta Head W/wo Contrast Ct Code Stroke Cta Neck W/wo Contrast Ct Code Stroke Cta Cerebral Perfusion W/wo Contrast 09/11/2019 IMPRESSION:  1. 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 There has been a remote  moderate left lateral frontal and upper insular infarct; CT perfusion maps show 38 cc of completed acute infarct and 56 cc of superimposed penumbra.  2. Limited atherosclerosis for age. No underlying flow limiting stenosis or embolic source identified.   Mr Brain Wo Contrast 09/12/2019 IMPRESSION:  1. Acute cortical infarct involving the majority of the left MCA distribution, sparing the upper perirolandic cortex and the superficial left temporal lobe. There is patchy petechial hemorrhage.  2. 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 Extensive chronic small vessel ischemia.   [place]  Ir Percutaneous Art Thrombectomy/infusion Intracranial Inc Diag Angio 09/12/2019 IMPRESSION:  Status post endovascular complete revascularization of occluded left middle cerebral artery M1 segment with 1 pass with the Embotrap 5 mm x 33 mm retrieval device, and 1 pass with the Solitaire 4 mm x 40 mm X retrieval device, and adjunct Penumbra aspiration achieving a TICI 3 revascularization.  PLAN: Follow-up in clinic 4 weeks post discharge.   Ct Head Code Stroke Wo Contrast 09/11/2019 IMPRESSION:  1. Hyperdense distal left M1 segment suggesting acute thrombosis. No  hemorrhage or visible acute infarct. There has been a remote moderate left MCA branch infarct at the lateral frontal lobe.  2. Advanced chronic small vessel ischemia in the cerebral white matter. There is also been remote right occipital infarct.   Vas [place] Lower Extremity Venous (dvt) 09/14/2019 Summary:  Right: There is no evidence of deep vein thrombosis in the lower extremity.  Left: There is no evidence of deep vein thrombosis in the lower extremity. Final    Cerebral Angio S/P Lt common carotid arteriogram followed by complete revascularization of occluded Lt LMCA M 1 seg with x 1 pass with the embotrap 70mm x 63mm retriver device and x 1 pass with the solitaire 66mmx 40 mmX retriever device achieving a TICI 3  revascularization.  Transthoracic Echocardiogram  09/12/2019  1. The patient refused the complete study. Globally EF appears normal ~65%. RV functional appears globally normal. Unable to provide much more information than that on this limited study.  2. Left ventricular ejection fraction, by visual estimation, is 60 to 65%. The left ventricle has normal function. There is mildly increased left ventricular hypertrophy.  3. Left ventricular diastolic parameters are consistent with Grade I diastolic dysfunction (impaired relaxation).  4. Global right ventricle has normal systolic function.The right ventricular size is normal. No increase in right ventricular wall thickness.  5. Left atrial size was not assessed.  6. Right atrial size was not assessed.  7. Presence of pericardial fat pad.  8. Mild mitral annular calcification.  9. The mitral valve was not assessed. not assessed mitral valve regurgitation. 10. The tricuspid valve is not assessed. Tricuspid valve regurgitation not assessed. 11. The aortic valve was not assessed. Aortic valve regurgitation is mild. 12. The pulmonic valve was not assessed. Pulmonic valve regurgitation not assessed. 13. Aortic root could not be assessed. 14. The interatrial septum was not assessed.  ECG - SR rate 70 BPM. (See cardiology reading for complete details)  PHYSICAL EXAM  Temp:  [98.2 F (36.8 C)-98.5 F (36.9 C)] 98.5 F (36.9 C) (11/02 0835) Pulse Rate:  [53-73] 73 (11/02 0835) Resp:  [18-20] 20 (11/02 0835) BP: (155-182)/(61-84) 170/72 (11/02 0835) SpO2:  [93 %-98 %] 93 % (11/02 0835) Weight:  [72.2 kg] 72.2 kg (11/02 0500)  General - Well nourished, well developed, in no apparent distress.  Ophthalmologic - fundi not visualized due to noncooperation.  Cardiovascular - Regular rhythm and rate.  Neuro -awake, alert, largely global aphasia except able to close eyes and open mouth as requested.  Not follow other simple commands and word salad.   No gaze preference, attending to both sides, EOMI, PERRL.  Blinking to visual threat bilaterally.  Significant right facial droop.  Tongue protrusion not corporative.  Moving all extremities symmetrically and against gravity.  DTR 1+, no Babinski. Sensation, coordination and gait not tested.   ASSESSMENT/PLAN April Le is a 78 y.o. female with history of significant for congestive heart failure(chronic diastolic heart failure), prior stroke, hypertension, hyperlipidemia and prior stroke with mild left leg weakness and mild aphasia" emergency department as a code stroke for left gaze deviation, right-sided weakness and aphasia secondary to left MCA stroke. Out of the tPA window. Sent to IR for L M1 occlusion.   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   Code Stroke CT Head - hyperdense L M1. No acute infarct. Old L MCA infarct L lateral frontal  lobe. Old R occipital infarct.  CTA H&N - left M1 occlusion into M2 branches.  CT Perfusion - 38 cc core with 56 cc penumbra.  Cerebral angio occlusion L MCA M1 w/ TICI3 revascularization  MRI L MCA cortical infarct, patchy petechial hemorrhage. Old L MCA and R PCA infarcts   2D Echo - EF 65%%. No source of embolus   LE venous Doppler no DVT  10/2017 30-day cardiac event monitor negative for A. Fib  Loop recorder placed to further rule out A. fib  LDL 43   HgbA1c 5.7   VTE prophylaxis -  Lovenox 40 mg sq daily     ASA 81mg  + Plavix  prior to admission, now on aspirin 325 and Plavix. Continue on discharge.    Therapy recommendations:  CIR  Disposition:  Pending  Medically ready for d/c to CIR once loop placed - rehab anticipates insurance auth will be available 11/3  Hx stroke/TIA  10/2017 30-day Cardiac event monitor no A. fib  2016 seen by [name] in the office - resultant gait d/o and dysarthria due to  extensive small vessel disease, no acute infarct on MRI. On Aggrenox  08/2009 - pt reported stroke, details not available in [facility]  02/2007 - pt reported stroke, details not available in EPIC  Hypertension  Home BP meds: Apresoline, lasix, lopressor  Current BP meds: Lopressor and Lasix . BP goal to < 180 . BP stable    . Long-term BP goal normotensive  Hyperlipidemia  Home Lipid lowering medication: Lipitor 40  LDL 43, goal < 70  Current lipid lowering medication: Lipitor 20 mg daily   Continue statin at discharge  Dysphagia . Secondary to stroke . Failed bedside swallow - NPO . Speech on board . Place cortrak -> no cortrak clogged -> replaced 11/2 . Re-start TF and po meds   Sundowning   Consecutive for 3 nights  Received haldol for the last 2 nights  Will start seroquel 25mg  Qhs.   Other Stroke Risk Factors  Advanced age  Congestive Heart Failure  Other Active Problems  Leukocytosis 10.6->7.8->7.2->7.5   Acute blood loss anemia 12.9-11.8-11.1->10.2->9.9->10.8   Pulmonary fibrosis    Mild thrombocytopenia - 138->180->197   Asymptomatic run VT x 5 - continue monitoring  Hospital day # 5  Rosalin Hawking, MD PhD Stroke Neurology 09/16/2019 12:40 PM   To contact Stroke Continuity provider, please refer to [email or web address] After hours, contact General Neurology

## 2019-09-16 NOTE — Consult Note (Addendum)
ELECTROPHYSIOLOGY CONSULT NOTE  Patient ID: April Le MRN: [id number], DOB/AGE: 07/20/1941   Admit date: 09/11/2019 Date of Consult: 09/16/2019  Primary Physician: Ronita Hipps, MD Primary Cardiologist: No primary care provider on file.  Primary Electrophysiologist: New to Dr. Lovena Le Reason for Consultation: Cryptogenic stroke; recommendations regarding Implantable Loop Recorder  History of Present Illness EP has been asked to evaluate April Le for placement of an implantable loop recorder to monitor for atrial fibrillation by Dr Erlinda Hong.  The patient was admitted on 09/11/2019 with left gaze deviation, R sided weakness, and aphasia.  They first developed symptoms while upon waking up 10/28.  Imaging demonstrated left MCA stroke outside of the tPA window. She was sent to IR for L M1 occlusion and underwent endovascular revascularization.  They have undergone workup for stroke including echocardiogram and CTA Head and Neck which showed left M1 occlusion into M2 branches as above The patient has been monitored on telemetry which has demonstrated sinus rhythm with no arrhythmias.  Inpatient stroke work-up will not require a TEE per Neurology.   Pt previously completed a 30 day monitor in 2018 for cryptogenic stroke. She previously had "atrial fibrillation" listed in her history on an admission to [facility], but no documented arrhythmia is available.   Echocardiogram this admission demonstrated LVEF 60-65% with no source of embolus. Lab work is reviewed.  Today, pt is unable to give me any history. She nods yes when asked if she is in the hospital, and if she had a stroke. Otherwise, she has significant expressive aphasia, and on-going encephalopathy. She has soft restraints on her hands, and pulled out her Cortrak yesterday.   Plans are to attend CIR  at discharge.  Past Medical History:  Diagnosis Date   Abdominal distention    past two months   Abnormality of gait  10/28/2015   Aphasia due to acute stroke 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 ([place]) 10/28/2015   Hypercholesteremia    Hypertension    Hypertensive heart disease with heart failure ([place]) 01/10/2017   Hypothyroidism    Hypoxia 09/04/2013   Leukocytosis 09/04/2013   Mass of ovary 10/26/2011   OSA (obstructive sleep apnea) 11/19/2013   Pneumonia    Shortness of breath    WITH EXERTION   Sinus drainage    PT STARTED ANTIBIOTIC 11/17/11 --IS HOARSE TODAY, SOME WHEEZING AND COUGH WITH YELLOW DRAINAGE   SIRS (systemic inflammatory response syndrome) (HCC) 09/04/2013   SOB (shortness of breath) 09/04/2013   Stroke ([place]) 02/2007, 08/2009   2 total -RESIDUAL WEAKNESS LEFT LEG--AND APHASIA   Stroke ([place]) 09/01/2017   Stroke ([place]) 09/08/2017   Tumor of ovary    Urinary incontinence    Urinary incontinence    Weakness of left leg  Mildly, post stroke     Surgical History:  Past Surgical History:  Procedure Laterality Date   ABDOMINAL HYSTERECTOMY     CHOLECYSTECTOMY     IR CT HEAD LTD  09/11/2019   IR PERCUTANEOUS ART THROMBECTOMY/INFUSION INTRACRANIAL INC DIAG ANGIO  09/11/2019   LAPAROTOMY  11/22/2011   Procedure: EXPLORATORY LAPAROTOMY;  Surgeon: Imagene Gurney A. Alycia Rossetti, MD;  Location: [facility];  Service:  Gynecology;  Laterality: N/A;   MENISCUS REPAIR     RADIOLOGY WITH ANESTHESIA N/A 09/11/2019   Procedure: IR WITH ANESTHESIA;  Surgeon: Radiologist, Medication, MD;  Location: [place];  Service: Radiology;  Laterality: N/A;   SALPINGOOPHORECTOMY  11/22/2011   Procedure: SALPINGO OOPHERECTOMY;  Surgeon: Imagene Gurney A. Alycia Rossetti, MD;  Location: [facility];  Service: Gynecology;  Laterality: Bilateral;   TOE SURGERY     TONSILLECTOMY     TUBAL LIGATION       Medications Prior to Admission  Medication Sig Dispense Refill Last Dose   acetaminophen (TYLENOL) 500 MG tablet Take 500 mg by mouth every 6 (six) hours as needed for mild pain.   unk   aspirin 81 MG tablet Take 81 mg by mouth daily with breakfast.    09/10/2019 at am   atorvastatin (LIPITOR) 40 MG tablet Take 40 mg by mouth at bedtime.    09/10/2019 at Unknown time   clopidogrel (PLAVIX) 75 MG tablet Take 75 mg by mouth daily.    09/10/2019 at 01100   donepezil (ARICEPT) 5 MG tablet Take 5 mg by mouth daily.   09/10/2019 at Unknown time   febuxostat (ULORIC) 40 MG tablet Take 40 mg by mouth daily.    09/10/2019 at Unknown time   furosemide (LASIX) 40 MG tablet Take 1 tablet (40 mg total) by mouth 2 (two) times daily. 180 tablet 1 09/10/2019 at Unknown time   hydrALAZINE (APRESOLINE) 25 MG tablet Take 0.5 tablet (12.5 mg) twice daily if your systolic blood pressure (top number) is consistently greater than 140 (Patient taking differently: Take 25 mg by mouth 2 (two) times daily. Take 0.5 tablet (12.5 mg) twice daily if your systolic blood pressure (top number) is consistently greater than 140) 60 tablet 3 09/10/2019 at Unknown time   levothyroxine (SYNTHROID, LEVOTHROID) 50 MCG tablet Take 50 mcg by mouth daily at 6 (six) AM.    09/10/2019 at Unknown time   metoprolol (LOPRESSOR) 50 MG tablet Take 50 mg by mouth 2 (two) times daily.    09/10/2019 at 2000   Multiple Vitamins-Minerals (MULTI-VITAMIN GUMMIES PO) Take 1 tablet by mouth daily.    09/10/2019 at Unknown time   pantoprazole (PROTONIX) 40 MG tablet Take 40 mg by mouth daily.    09/10/2019 at Unknown time   potassium chloride SA (K-DUR,KLOR-CON) 20 MEQ tablet Take 20 mEq by mouth daily.    09/10/2019 at Unknown time    Inpatient Medications:   aspirin  325 mg Per Tube Daily   atorvastatin  20 mg Per Tube q1800   chlorhexidine  15 mL Mouth Rinse BID   Chlorhexidine Gluconate Cloth  6 each Topical Daily   clopidogrel  75 mg Per Tube Daily   donepezil  5 mg Per Tube Daily   enoxaparin (LOVENOX) injection  40 mg Subcutaneous Q24H   free water  200 mL Per Tube Q6H   furosemide  40 mg Intravenous BID   levothyroxine  50 mcg Per Tube Q0600   mouth rinse  15 mL Mouth Rinse q12n4p   metoprolol tartrate  50 mg Per Tube BID   potassium chloride SA  20 mEq Oral Daily   QUEtiapine  25 mg Oral QHS    Allergies:  Allergies  Allergen Reactions   Amoxicillin-Pot Clavulanate Diarrhea    GI INTOLERANCE ONLY WITH DIARRHEA    Social History   Socioeconomic History   Marital status: Married    Spouse name: Not on file   Number of children: 2   Years of education: 13   Highest education level: Not on file  Occupational History   Occupation: retired  Scientist, product/process development strain: Not on file   Food insecurity    Worry: Not on file    Inability: Not on file   Transportation needs    Medical: Not on file    Non-medical: Not on file  Tobacco Use   Smoking status: Former Smoker    Types: Cigarettes    Quit date: 11/15/1959    Years since quitting: 59.8   Smokeless tobacco: Never Used  Substance and Sexual Activity   Alcohol use: Yes    Alcohol/week: 0.0 standard drinks    Comment: very rarely   Drug use: No   Sexual activity: Never  Lifestyle   Physical activity    Days per week: Not on file    Minutes per session: Not on file   Stress: Not on file  Relationships   Social connections    Talks on phone: Not on file     Gets together: Not on file    Attends religious service: Not on file    Active member of club or organization: Not on file    Attends meetings of clubs or organizations: Not on file    Relationship status: Not on file   Intimate partner violence    Fear of current or ex partner: Not on file    Emotionally abused: Not on file    Physically abused: Not on file    Forced sexual activity: Not on file  Other Topics Concern   Not on file  Social History Narrative   Patient does not drink caffeine.   Patient is right handed.      Family History  Problem Relation Age of Onset   Kidney failure Mother    Heart attack Father    Heart failure Father    Stomach cancer Other    Stomach cancer Maternal Grandmother    Stroke Paternal Grandmother       Review of Systems: All other systems reviewed and are otherwise negative except as noted above.  Physical Exam: Vitals:   09/15/19 2027 09/16/19 0001 09/16/19 0411 09/16/19 0500  BP: (!) 170/84 (!) 155/65 (!) 182/78   Pulse: 71 (!) 57 67   Resp: 20 20 20    Temp: 98.2 F (36.8 C) 98.4 F (36.9 C) 98.3 F (36.8 C)   TempSrc: Oral Oral Oral   SpO2: 97% 96% 94%   Weight:    72.2 kg  Height:        GEN- The patient is chronically ill appearing. Alert to person and appears to be alert to place. Confounded by expressive aphasia.  Head- normocephalic, atraumatic Eyes-  Sclera clear, conjunctiva pink Ears- hearing intact Oropharynx- clear Neck- supple Lungs- Clear to ausculation bilaterally,  [place] in place.  Heart- Regular rate and rhythm, no murmurs, rubs or gallops  GI- soft, NT, ND, + BS Extremities- no clubbing, cyanosis, or edema. Soft restraints in place.  MS- no significant deformity or  atrophy Skin- no rash or lesion Psych- Flat affect.    Labs:   Lab Results  Component Value Date   WBC 7.5 09/16/2019   HGB 10.8 (L) 09/16/2019   HCT 31.6 (L) 09/16/2019   MCV 90.5 09/16/2019   PLT 197 09/16/2019    Recent  Labs  Lab 09/11/19 0817  09/16/19 0335  NA 139   < > 141  K 3.9   < > 3.7  CL 105   < > 105  CO2 23   < > 27  BUN 30*   < > 13  CREATININE 1.25*   < > 0.96  CALCIUM 8.8*   < > 8.7*  PROT 5.7*  --   --   BILITOT 0.8  --   --   ALKPHOS 77  --   --   ALT 16  --   --   AST 22  --   --   GLUCOSE 158*   < > 113*   < > = values in this interval not displayed.     Radiology/Studies: Ct Code Stroke Cta Head W/wo Contrast  Result Date: 09/11/2019 CLINICAL DATA:  Stroke symptoms EXAM: CT ANGIOGRAPHY HEAD AND NECK CT PERFUSION BRAIN TECHNIQUE: Multidetector CT imaging of the head and neck was performed using the standard protocol during bolus administration of intravenous contrast. Multiplanar CT image reconstructions and MIPs were obtained to evaluate the vascular anatomy. Carotid stenosis measurements (when applicable) are obtained utilizing NASCET criteria, using the distal internal carotid diameter as the denominator. Multiphase CT imaging of the brain was performed following IV bolus contrast injection. Subsequent parametric perfusion maps were calculated using RAPID software. CONTRAST:  162mL OMNIPAQUE IOHEXOL 350 MG/ML SOLN COMPARISON:  Head CT from earlier today. FINDINGS: Delayed study relative to the noncontrast CT due to difficulty with IV access per report. CTA NECK FINDINGS Aortic arch: Atherosclerotic calcification.  Three vessel branching. Right carotid system: Limited atheromatous changes. No stenosis or ulceration. Left carotid system: Limited atheromatous changes for age. No stenosis or ulceration. Vertebral arteries: Proximal subclavian atherosclerosis is mild. Widely patent vertebral arteries with smooth luminal contour. Skeleton: No acute finding.  Cervical spine degeneration. Other neck: No significant incidental finding Upper chest: Negative Review of the MIP images confirms the above findings CTA HEAD FINDINGS Anterior circulation: Central branching filling defect at the left  distal M1 and proximal M2 segments with underfilling of downstream vessels. No right-sided embolism or branch occlusion is seen. Hypoplastic left A1 segment. Atherosclerotic plaque on both carotid siphons mild to moderate narrowing on the right. Posterior circulation: Vertebrobasilar arteries are smooth and widely patent. No branch occlusion or beading. Negative for aneurysm Venous sinuses: Unremarkable in the arterial phase Anatomic variants: As above Review of the MIP images confirms the above findings CT Brain Perfusion Findings: ASPECTS: Not scored given the extent of chronic changes. No acute infarct is detected. CBF (<30%) Volume: 31mL-some of this mapping may be overestimated as there is likely some bleeding into the chronic infarct the lateral frontal lobe. Perfusion (Tmax>6.0s) volume: 23mL Mismatch Volume: 56 with mismatched ratio of 2.41mL Infarction Location:Left posterior frontal These results were communicated to Dr. Lorraine Lax at [place] 10/28/2020by text page via the [facility] messaging system. Embolectomy procedure has already been ordered. IMPRESSION: 1. 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 There has been a remote moderate left lateral frontal and upper insular infarct; CT perfusion maps show 38 cc of completed acute infarct and 56  cc of superimposed penumbra. 2. Limited atherosclerosis for age. No underlying flow limiting stenosis or embolic source identified. Electronically Signed   By: Monte Fantasia M.D.   On: 09/11/2019 07:27   Ct Code Stroke Cta Neck W/wo Contrast  Result Date: 09/11/2019 CLINICAL DATA:  Stroke symptoms EXAM: CT ANGIOGRAPHY HEAD AND NECK CT PERFUSION BRAIN TECHNIQUE: Multidetector CT imaging of the head and neck was performed using the standard protocol during bolus administration of intravenous contrast. Multiplanar CT image reconstructions and MIPs were obtained to evaluate the vascular anatomy. Carotid stenosis  measurements (when applicable) are obtained utilizing NASCET criteria, using the distal internal carotid diameter as the denominator. Multiphase CT imaging of the brain was performed following IV bolus contrast injection. Subsequent parametric perfusion maps were calculated using RAPID software. CONTRAST:  115mL OMNIPAQUE IOHEXOL 350 MG/ML SOLN COMPARISON:  Head CT from earlier today. FINDINGS: Delayed study relative to the noncontrast CT due to difficulty with IV access per report. CTA NECK FINDINGS Aortic arch: Atherosclerotic calcification.  Three vessel branching. Right carotid system: Limited atheromatous changes. No stenosis or ulceration. Left carotid system: Limited atheromatous changes for age. No stenosis or ulceration. Vertebral arteries: Proximal subclavian atherosclerosis is mild. Widely patent vertebral arteries with smooth luminal contour. Skeleton: No acute finding.  Cervical spine degeneration. Other neck: No significant incidental finding Upper chest: Negative Review of the MIP images confirms the above findings CTA HEAD FINDINGS Anterior circulation: Central branching filling defect at the left distal M1 and proximal M2 segments with underfilling of downstream vessels. No right-sided embolism or branch occlusion is seen. Hypoplastic left A1 segment. Atherosclerotic plaque on both carotid siphons mild to moderate narrowing on the right. Posterior circulation: Vertebrobasilar arteries are smooth and widely patent. No branch occlusion or beading. Negative for aneurysm Venous sinuses: Unremarkable in the arterial phase Anatomic variants: As above Review of the MIP images confirms the above findings CT Brain Perfusion Findings: ASPECTS: Not scored given the extent of chronic changes. No acute infarct is detected. CBF (<30%) Volume: 4mL-some of this mapping may be overestimated as there is likely some bleeding into the chronic infarct the lateral frontal lobe. Perfusion (Tmax>6.0s) volume: 14mL  Mismatch Volume: 56 with mismatched ratio of 2.33mL Infarction Location:Left posterior frontal These results were communicated to Dr. Lorraine Lax at [place] 10/28/2020by text page via the [facility] messaging system. Embolectomy procedure has already been ordered. IMPRESSION: 1. 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 There has been a remote moderate left lateral frontal and upper insular infarct; CT perfusion maps show 38 cc of completed acute infarct and 56 cc of superimposed penumbra. 2. Limited atherosclerosis for age. No underlying flow limiting stenosis or embolic source identified. Electronically Signed   By: Monte Fantasia M.D.   On: 09/11/2019 07:27   Mr Brain Wo Contrast  Result Date: 09/12/2019 CLINICAL DATA:  Mild aphasia. EXAM: MRI HEAD WITHOUT CONTRAST TECHNIQUE: Multiplanar, multiecho pulse sequences of the brain and surrounding structures were obtained without intravenous contrast. COMPARISON:  Code stroke CT from yesterday FINDINGS: Brain: Restricted diffusion throughout the large majority of the left MCA distribution cortex, with sparing at the margins including along the upper motor strip and in the superficial left temporal lobe. Tiny acute cortical infarct seen in the parasagittal right frontal lobe, ACA territory. Petechial hemorrhage is present along the left cerebral infarct. Moderate remote left MCA branch infarct affecting the lateral frontal lobe and upper insula. Remote right occipital infarct. Small remote right cerebellar  infarct. Ischemic gliosis is confluent in the cerebral white matter. Age congruent volume loss. Vascular: Normal flow voids Skull and upper cervical spine: Negative for marrow lesion Sinuses/Orbits: Right cataract resection. Mild mucosal thickening in the paranasal sinuses. Partial opacification of the right mastoid air cells. IMPRESSION: 1. Acute cortical infarct involving the majority of the left MCA  distribution, sparing the upper perirolandic cortex and the superficial left temporal lobe. There is patchy petechial hemorrhage. 2. 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 Extensive chronic small vessel ischemia. Electronically Signed   By: Monte Fantasia M.D.   On: 09/12/2019 07:06   [place]  Result Date: 09/12/2019 INDICATION: New onset of left gaze deviation, aphasia, and right-sided weakness. Occluded left middle cerebral artery proximally on CT angiogram of the head and neck. EXAM: 1. EMERGENT LARGE VESSEL OCCLUSION THROMBOLYSIS (anterior CIRCULATION) COMPARISON:  CT angiogram of the head and neck of September 11, 2019. MEDICATIONS: Vancomycin 1 g IV antibiotic was administered within 1 hour of the procedure. ANESTHESIA/SEDATION: General anesthesia. CONTRAST:  Isovue 300 approximately 60 mL. FLUOROSCOPY TIME:  Fluoroscopy Time: 36 minutes 0 seconds (1498 mGy). COMPLICATIONS: None immediate. TECHNIQUE: Following a full explanation of the procedure along with the potential associated complications, an informed witnessed consent was obtained from the spouse and patient's daughter. The risks of intracranial hemorrhage of 10%, worsening neurological deficit, ventilator dependency, death and inability to revascularize were all reviewed in detail with the patient's spouse and daughter. The patient was then put under general anesthesia by the Department of Anesthesiology at [facility]. The right groin was prepped and draped in the usual sterile fashion. Thereafter using modified Seldinger technique, transfemoral access into the right common femoral artery was obtained without difficulty. Over a 0.035 inch guidewire a 5 French Pinnacle sheath was inserted. Through this, and also over a 0.035 inch guidewire a 5 [place] JB 1 catheter was advanced to the aortic arch region and selectively positioned in the left common carotid artery. FINDINGS: The left common carotid arteriogram demonstrates the  left external carotid artery and its major branches to be widely patent. The left internal carotid artery at the bulb to the cranial skull base demonstrates wide patency. The petrous, cavernous and the supraclinoid segments are widely patent. A small infundibulum is seen at the origin of the left posterior communicating artery. The left anterior cerebral artery opacifies into the capillary and venous phases. The left middle cerebral artery just distal to the anterior temporal branch demonstrates complete occlusion. PROCEDURE: The diagnostic JB 1 catheter in the left common carotid artery was then exchanged over a 0.035 300 cm Rosen exchange guidewire for an 8 [place] Pinnacle sheath which was then connected to continuous heparinized saline infusion. Over the [facility] exchange guidewire, a 95 cm 087 Q[place]el balloon guide catheter which had been prepped with 50% contrast and 50% heparinized saline infusion was advanced and positioned just proximal to the left common carotid bifurcation. The guidewire was removed. Good aspiration obtained from the hub of the balloon guide catheter. A gentle control arteriogram performed through this demonstrated no change in the intracranial or the extracranial circulation. Over a 0.014 inch Softip Synchro micro guidewire, a combination of an 021 Trevo ProVue microcatheter inside of a 6 French 132 cm Catalyst guide catheter was advanced without difficulty to the supraclinoid left ICA. With the micro guidewire leading with a J-tip configuration to avoid dissections or inducing spasm, the combination was navigated to the proximal left middle cerebral artery. Using a  torque device the micro guidewire was then advanced to the distal M2 M3 region of the inferior division followed by the microcatheter. The guidewire was removed. Good aspiration was obtained from the hub of the microcatheter. Gentle control arteriogram performed through the microcatheter demonstrated safe position of tip of the  microcatheter. This was then connected to continuous heparinized saline infusion. A 5 mm x 33 mm Embotrap retrieval device was then advanced to the distal end of the microcatheter. With the O ring on the delivery microcatheter loosened, and slight forward traction with the right hand on the delivery micro guidewire, the distal and then the proximal portion of the device was then deployed. At this time the 6 [place] Catalyst guide catheter was advanced in order to engage the proximal portion of the clot. With proximal flow arrest in the right internal carotid artery with balloon catheter having been advanced into the proximal 1/3, and constant aspiration being applied with a 60 mL syringe at the hub of the balloon guide catheter and aspiration applied with a Penumbra aspiration device at the hub of the 6 [place] Catalyst guide catheter for approximately 2-1/2 minutes, the combination of the retrieval device, the microcatheter and the 6 [place] Catalyst guide catheter was retrieved and removed. Aspiration was continued whilst the balloon was deflated in the left internal carotid artery. A few specks of clot were seen entangled in the cells of the retrieval device. A control arteriogram performed through the balloon guide catheter in the proximal left internal carotid artery demonstrated slight proximal migration of the clot compared to previously. A second pass was then made again using the above combination. After having ascertained safe position of tip of the microcatheter in the M2 M3 region of the inferior division, and having verified the safe tip of the microcatheter, a 4 mm x 40 mm Solitaire X retrieval device was advanced to the distal end of the microcatheter. The O ring on the delivery microcatheter was then loosened. With slight forward gentle traction with the right hand on the delivery micro guidewire, with the left hand the delivery microcatheter was retrieved unsheathing the retrieval device. The 6 [place]  Catalyst guide catheter was now advanced further into the clot of the left middle cerebral artery. Proximal flow arrest was then initiated by inflating the balloon guide catheter in the mid 1/3 of the left internal carotid artery. Constant aspiration with a Penumbra aspiration device was then initiated at the hub of the Catalyst guide catheter for approximately 2 minutes. Following this, the combination of the retrieval device, the microcatheter and the 6 [place] Catalyst guide catheter were then retrieved and removed as constant aspiration was applied at the hub of the balloon guide catheter with deflation of the balloon in the left internal carotid artery. Free back bleed of blood was seen at the hub of the balloon guide catheter. A control arteriogram performed through the balloon guide catheter in the left internal carotid artery demonstrated complete revascularization of the left middle cerebral artery achieving a TICI 3 revascularization. The left anterior cerebral artery remained widely patent as well. Wide patency of the left internal carotid artery extra cranially was maintained with mild spasm at the distal 1/3 of the left internal carotid artery. The balloon guide catheter was retrieved and removed. The 8 French Pinnacle sheath in the right groin was then removed with the successful application of an 8 [place] Angio-Seal closure device. The right groin appeared soft without evidence of a hematoma or bleeding. Distal pulses in  the dorsalis pedis, and the posterior tibial regions remained palpable unchanged. A flat panel CT of the brain revealed no evidence of an intracranial hemorrhage, or mass effect or midline shift. The patient's general anesthesia was then reversed and the patient extubated. Upon recovery, the patient was able to move her left arm and leg, and bend her right knee spontaneously and to command. She was then transferred to the PACU and then neuro ICU for post thrombectomy management.  IMPRESSION: Status post endovascular complete revascularization of occluded left middle cerebral artery M1 segment with 1 pass with the Embotrap 5 mm x 33 mm retrieval device, and 1 pass with the Solitaire 4 mm x 40 mm X retrieval device, and adjunct Penumbra aspiration achieving a TICI 3 revascularization. PLAN: Follow-up in clinic 4 weeks post discharge. Electronically Signed   By: Luanne Bras M.D.   On: 09/11/2019 11:53   Ct Code Stroke Cta Cerebral Perfusion W/wo Contrast  Result Date: 09/11/2019 CLINICAL DATA:  Stroke symptoms EXAM: CT ANGIOGRAPHY HEAD AND NECK CT PERFUSION BRAIN TECHNIQUE: Multidetector CT imaging of the head and neck was performed using the standard protocol during bolus administration of intravenous contrast. Multiplanar CT image reconstructions and MIPs were obtained to evaluate the vascular anatomy. Carotid stenosis measurements (when applicable) are obtained utilizing NASCET criteria, using the distal internal carotid diameter as the denominator. Multiphase CT imaging of the brain was performed following IV bolus contrast injection. Subsequent parametric perfusion maps were calculated using RAPID software. CONTRAST:  157mL OMNIPAQUE IOHEXOL 350 MG/ML SOLN COMPARISON:  Head CT from earlier today. FINDINGS: Delayed study relative to the noncontrast CT due to difficulty with IV access per report. CTA NECK FINDINGS Aortic arch: Atherosclerotic calcification.  Three vessel branching. Right carotid system: Limited atheromatous changes. No stenosis or ulceration. Left carotid system: Limited atheromatous changes for age. No stenosis or ulceration. Vertebral arteries: Proximal subclavian atherosclerosis is mild. Widely patent vertebral arteries with smooth luminal contour. Skeleton: No acute finding.  Cervical spine degeneration. Other neck: No significant incidental finding Upper chest: Negative Review of the MIP images confirms the above findings CTA HEAD FINDINGS Anterior  circulation: Central branching filling defect at the left distal M1 and proximal M2 segments with underfilling of downstream vessels. No right-sided embolism or branch occlusion is seen. Hypoplastic left A1 segment. Atherosclerotic plaque on both carotid siphons mild to moderate narrowing on the right. Posterior circulation: Vertebrobasilar arteries are smooth and widely patent. No branch occlusion or beading. Negative for aneurysm Venous sinuses: Unremarkable in the arterial phase Anatomic variants: As above Review of the MIP images confirms the above findings CT Brain Perfusion Findings: ASPECTS: Not scored given the extent of chronic changes. No acute infarct is detected. CBF (<30%) Volume: 61mL-some of this mapping may be overestimated as there is likely some bleeding into the chronic infarct the lateral frontal lobe. Perfusion (Tmax>6.0s) volume: 95mL Mismatch Volume: 56 with mismatched ratio of 2.71mL Infarction Location:Left posterior frontal These results were communicated to Dr. Lorraine Lax at [place] 10/28/2020by text page via the [facility] messaging system. Embolectomy procedure has already been ordered. IMPRESSION: 1. 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 There has been a remote moderate left lateral frontal and upper insular infarct; CT perfusion maps show 38 cc of completed acute infarct and 56 cc of superimposed penumbra. 2. Limited atherosclerosis for age. No underlying flow limiting stenosis or embolic source identified. Electronically Signed   By: Monte Fantasia M.D.   On: 09/11/2019  07:27   Ir Percutaneous Art Thrombectomy/infusion Intracranial Inc Diag Angio  Result Date: 09/12/2019 INDICATION: New onset of left gaze deviation, aphasia, and right-sided weakness. Occluded left middle cerebral artery proximally on CT angiogram of the head and neck. EXAM: 1. EMERGENT LARGE VESSEL OCCLUSION THROMBOLYSIS (anterior CIRCULATION) COMPARISON:   CT angiogram of the head and neck of September 11, 2019. MEDICATIONS: Vancomycin 1 g IV antibiotic was administered within 1 hour of the procedure. ANESTHESIA/SEDATION: General anesthesia. CONTRAST:  Isovue 300 approximately 60 mL. FLUOROSCOPY TIME:  Fluoroscopy Time: 36 minutes 0 seconds (1498 mGy). COMPLICATIONS: None immediate. TECHNIQUE: Following a full explanation of the procedure along with the potential associated complications, an informed witnessed consent was obtained from the spouse and patient's daughter. The risks of intracranial hemorrhage of 10%, worsening neurological deficit, ventilator dependency, death and inability to revascularize were all reviewed in detail with the patient's spouse and daughter. The patient was then put under general anesthesia by the Department of Anesthesiology at [facility]. The right groin was prepped and draped in the usual sterile fashion. Thereafter using modified Seldinger technique, transfemoral access into the right common femoral artery was obtained without difficulty. Over a 0.035 inch guidewire a 5 French Pinnacle sheath was inserted. Through this, and also over a 0.035 inch guidewire a 5 [place] JB 1 catheter was advanced to the aortic arch region and selectively positioned in the left common carotid artery. FINDINGS: The left common carotid arteriogram demonstrates the left external carotid artery and its major branches to be widely patent. The left internal carotid artery at the bulb to the cranial skull base demonstrates wide patency. The petrous, cavernous and the supraclinoid segments are widely patent. A small infundibulum is seen at the origin of the left posterior communicating artery. The left anterior cerebral artery opacifies into the capillary and venous phases. The left middle cerebral artery just distal to the anterior temporal branch demonstrates complete occlusion. PROCEDURE: The diagnostic JB 1 catheter in the left common carotid artery  was then exchanged over a 0.035 300 cm Rosen exchange guidewire for an 8 [place] Pinnacle sheath which was then connected to continuous heparinized saline infusion. Over the [facility] exchange guidewire, a 95 cm 087 [place] balloon guide catheter which had been prepped with 50% contrast and 50% heparinized saline infusion was advanced and positioned just proximal to the left common carotid bifurcation. The guidewire was removed. Good aspiration obtained from the hub of the balloon guide catheter. A gentle control arteriogram performed through this demonstrated no change in the intracranial or the extracranial circulation. Over a 0.014 inch Softip Synchro micro guidewire, a combination of an 021 Trevo ProVue microcatheter inside of a 6 French 132 cm Catalyst guide catheter was advanced without difficulty to the supraclinoid left ICA. With the micro guidewire leading with a J-tip configuration to avoid dissections or inducing spasm, the combination was navigated to the proximal left middle cerebral artery. Using a torque device the micro guidewire was then advanced to the distal M2 M3 region of the inferior division followed by the microcatheter. The guidewire was removed. Good aspiration was obtained from the hub of the microcatheter. Gentle control arteriogram performed through the microcatheter demonstrated safe position of tip of the microcatheter. This was then connected to continuous heparinized saline infusion. A 5 mm x 33 mm Embotrap retrieval device was then advanced to the distal end of the microcatheter. With the O ring on the delivery microcatheter loosened, and slight forward traction with the right hand on the  delivery micro guidewire, the distal and then the proximal portion of the device was then deployed. At this time the 6 [place] Catalyst guide catheter was advanced in order to engage the proximal portion of the clot. With proximal flow arrest in the right internal carotid artery with balloon catheter  having been advanced into the proximal 1/3, and constant aspiration being applied with a 60 mL syringe at the hub of the balloon guide catheter and aspiration applied with a Penumbra aspiration device at the hub of the 6 [place] Catalyst guide catheter for approximately 2-1/2 minutes, the combination of the retrieval device, the microcatheter and the 6 [place] Catalyst guide catheter was retrieved and removed. Aspiration was continued whilst the balloon was deflated in the left internal carotid artery. A few specks of clot were seen entangled in the cells of the retrieval device. A control arteriogram performed through the balloon guide catheter in the proximal left internal carotid artery demonstrated slight proximal migration of the clot compared to previously. A second pass was then made again using the above combination. After having ascertained safe position of tip of the microcatheter in the M2 M3 region of the inferior division, and having verified the safe tip of the microcatheter, a 4 mm x 40 mm Solitaire X retrieval device was advanced to the distal end of the microcatheter. The O ring on the delivery microcatheter was then loosened. With slight forward gentle traction with the right hand on the delivery micro guidewire, with the left hand the delivery microcatheter was retrieved unsheathing the retrieval device. The 6 [place] Catalyst guide catheter was now advanced further into the clot of the left middle cerebral artery. Proximal flow arrest was then initiated by inflating the balloon guide catheter in the mid 1/3 of the left internal carotid artery. Constant aspiration with a Penumbra aspiration device was then initiated at the hub of the Catalyst guide catheter for approximately 2 minutes. Following this, the combination of the retrieval device, the microcatheter and the 6 [place] Catalyst guide catheter were then retrieved and removed as constant aspiration was applied at the hub of the balloon guide  catheter with deflation of the balloon in the left internal carotid artery. Free back bleed of blood was seen at the hub of the balloon guide catheter. A control arteriogram performed through the balloon guide catheter in the left internal carotid artery demonstrated complete revascularization of the left middle cerebral artery achieving a TICI 3 revascularization. The left anterior cerebral artery remained widely patent as well. Wide patency of the left internal carotid artery extra cranially was maintained with mild spasm at the distal 1/3 of the left internal carotid artery. The balloon guide catheter was retrieved and removed. The 8 French Pinnacle sheath in the right groin was then removed with the successful application of an 8 [place] Angio-Seal closure device. The right groin appeared soft without evidence of a hematoma or bleeding. Distal pulses in the dorsalis pedis, and the posterior tibial regions remained palpable unchanged. A flat panel CT of the brain revealed no evidence of an intracranial hemorrhage, or mass effect or midline shift. The patient's general anesthesia was then reversed and the patient extubated. Upon recovery, the patient was able to move her left arm and leg, and bend her right knee spontaneously and to command. She was then transferred to the PACU and then neuro ICU for post thrombectomy management. IMPRESSION: Status post endovascular complete revascularization of occluded left middle cerebral artery M1 segment with 1 pass with the  Embotrap 5 mm x 33 mm retrieval device, and 1 pass with the Solitaire 4 mm x 40 mm X retrieval device, and adjunct Penumbra aspiration achieving a TICI 3 revascularization. PLAN: Follow-up in clinic 4 weeks post discharge. Electronically Signed   By: Luanne Bras M.D.   On: 09/11/2019 11:53   Ct Head Code Stroke Wo Contrast  Result Date: 09/11/2019 CLINICAL DATA:  Code stroke.  Generalized muscle weakness EXAM: CT HEAD WITHOUT CONTRAST  TECHNIQUE: Contiguous axial images were obtained from the base of the skull through the vertex without intravenous contrast. COMPARISON:  Brain MRI 09/08/2017 FINDINGS: Brain: No evidence of acute infarction, hemorrhage, hydrocephalus, extra-axial collection or mass lesion/mass effect. Moderate remote left MCA branch infarct affecting the lateral frontal lobe. Moderate remote right occipital infarct. Extensive chronic small vessel ischemic gliosis in the cerebral white matter. Small remote right cerebellar infarct Vascular: Hyperdense left distal M1 segment extending into the M2 branches. Skull: Normal. Negative for fracture or focal lesion. Sinuses/Orbits: No acute finding. Other: These results were communicated to Dr. Lorraine Lax at 6:34 amon 10/28/2020by text page via the [facility] messaging system. ASPECTS [facility] Stroke Program Early CT Score) Not scored given the extent of chronic change. IMPRESSION: 1. Hyperdense distal left M1 segment suggesting acute thrombosis. No hemorrhage or visible acute infarct. There has been a remote moderate left MCA branch infarct at the lateral frontal lobe. 2. Advanced chronic small vessel ischemia in the cerebral white matter. There is also been remote right occipital infarct. Electronically Signed   By: Monte Fantasia M.D.   On: 09/11/2019 06:36   Vas [place] Lower Extremity Venous (dvt)  Result Date: 09/14/2019  Lower Venous Study Indications: Stroke.  Limitations: Confusion. Comparison Study: No prior study on file for comparison Performing Technologist: Sharion Dove RVS  Examination Guidelines: A complete evaluation includes B-mode imaging, spectral Doppler, color Doppler, and power Doppler as needed of all accessible portions of each vessel. Bilateral testing is considered an integral part of a complete examination. Limited examinations for reoccurring indications may be performed as noted.  +---------+---------------+---------+-----------+----------+--------------+  RIGHT      Compressibility Phasicity Spontaneity Properties Thrombus Aging  +---------+---------------+---------+-----------+----------+--------------+  CFV       Full            Yes       Yes                                    +---------+---------------+---------+-----------+----------+--------------+  SFJ       Full                                                             +---------+---------------+---------+-----------+----------+--------------+  FV Prox   Full                                                             +---------+---------------+---------+-----------+----------+--------------+  FV Mid    Full                                                             +---------+---------------+---------+-----------+----------+--------------+  FV Distal Full                                                             +---------+---------------+---------+-----------+----------+--------------+  PFV       Full                                                             +---------+---------------+---------+-----------+----------+--------------+  POP       Full            Yes       Yes                                    +---------+---------------+---------+-----------+----------+--------------+  PTV       Full                                                             +---------+---------------+---------+-----------+----------+--------------+  PERO      Full                                                             +---------+---------------+---------+-----------+----------+--------------+   +---------+---------------+---------+-----------+----------+-------------------+  LEFT      Compressibility Phasicity Spontaneity Properties Thrombus Aging       +---------+---------------+---------+-----------+----------+-------------------+  CFV       Full            Yes       Yes                                         +---------+---------------+---------+-----------+----------+-------------------+  SFJ       Full                                                                   +---------+---------------+---------+-----------+----------+-------------------+  FV Prox   Full                                                                  +---------+---------------+---------+-----------+----------+-------------------+  FV Mid    Full                                                                  +---------+---------------+---------+-----------+----------+-------------------+  FV Distal Full                                                                  +---------+---------------+---------+-----------+----------+-------------------+  PFV       Full                                                                  +---------+---------------+---------+-----------+----------+-------------------+  POP                       Yes       Yes                    patent by color and                                                              Doppler              +---------+---------------+---------+-----------+----------+-------------------+  PTV       Full                                                                  +---------+---------------+---------+-----------+----------+-------------------+  PERO      Full                                                                  +---------+---------------+---------+-----------+----------+-------------------+     Summary: Right: There is no evidence of deep vein thrombosis in the lower extremity. Left: There is no evidence of deep vein thrombosis in the lower extremity.  *See table(s) above for measurements and observations. Electronically signed by Deitra Mayo MD on 09/14/2019 at 5:56:00 PM.    Final     12-lead ECG 09/14/2019 NSR at 74 bpm (personally reviewed) All prior EKG's in EPIC reviewed with no documented atrial fibrillation. EKG 09/04/2013 showed supraventricular tachycardia in the setting of early PNA/Bronchitis. Reviewed with Dr. Lovena Le.   Telemetry NSR with PACs 70-90s, 1 5  beat run of NSVT (personally reviewed)  Assessment and Plan:  1. Cryptogenic stroke The patient presents with cryptogenic stroke.  The patient does not have a TEE planned for this AM.  At this time, I do not feel the patient is able to consent for this procedure and will speak with her family. I will discuss course with MD.   Spoke with her daughter at length. This is patients fifth  stroke. Prior to this admission, she was able to do most of her ADLs independently including bathe using a shower chair, and prepare small meals using the microwave. She had no difficulty communicating or decision making prior to this admission.  Will discuss disposition with MD.  ADDENDUM:    Risks, benefits, and alteratives to implantable loop recorder were discussed with the patient and daughter (via phone) today.   At this time, the patient agrees to loop and the family is very clear in their decision to proceed with implantable loop recorder.   Wound care was reviewed with the patient and family (keep incision clean and dry for 3 days).  Wound check scheduled and entered in AVS. Please call with questions.   Shirley Friar, PA-C 09/16/2019 9:12 AM  EP Attending  Patient seen and examined. Agree with the findings as noted above. The patient has had a cryptogenic stroke. I have reviewed the indications/risks/benefits/goals/expectations of ILR insertion and she wishes to proceed.  Mikle Bosworth.D.

## 2019-09-16 NOTE — Progress Notes (Signed)
Occupational Therapy Treatment Patient Details Name: April Le MRN: [id number] DOB: May 30, 1941 Today's Date: 09/16/2019    History of present illness April Le is a 78 y.o. female with past medical history significant for congestive heart failure(chronic diastolic heart failure), prior stroke, hypertension, hyperlipidemia and prior stroke with residual mild left leg weakness and mild aphasia. She presented to the emergency department as a code stroke for left gaze deviation, right-sided weakness and aphasia on 09/11/19 when she was found non-verbal with right side weakness and facial droop. Stat CT head was obtained which showed no acute hemorrhage/infarct.  CT head did demonstrate old infarcts most notably left MCA stroke and R PCA stroke.  CT angiogram and CT perfusion showed left M1 occlusion. Intubated briefly for arteriogram and TICI revascularization.     OT comments  Pt received in bed with her husband present. Pt currently requires modA for grooming while standing at sink level demonstrating apraxia with use of toothbrush to brush her hair. Pt demonstrates right side inattention, bumping into objects on right side during functional mobility. Pt continues to demonstrate cognitive and physical limitations impacting safety and independence with ADL and functional mobility. Pt will continue to benefit from skilled OT services to maximize safety and independence with ADL/IADL and functional mobility. Will continue to follow acutely and progress as tolerated. Continue to recommend d/c to CIR.   Follow Up Recommendations  CIR    Equipment Recommendations  3 in 1 bedside commode    Recommendations for Other Services      Precautions / Restrictions Precautions Precautions: Fall Restrictions Weight Bearing Restrictions: No       Mobility Bed Mobility Overal bed mobility: Needs Assistance Bed Mobility: Supine to Sit;Sit to Supine     Supine to sit: Min assist Sit to supine: Min  assist   General bed mobility comments: minA for trunk and BLE management   Transfers Overall transfer level: Needs assistance Equipment used: Rolling walker (2 wheeled);1 person hand held assist Transfers: Sit to/from Stand Sit to Stand: Mod assist;Min assist         General transfer comment: modA initiatlly for powerup progressing to minA for sit<>stand    Balance Overall balance assessment: Needs assistance Sitting-balance support: No upper extremity supported;Feet supported Sitting balance-Leahy Scale: Fair Sitting balance - Comments: no LOB sitting EOB, no support from therapist needed   Standing balance support: Single extremity supported;During functional activity Standing balance-Leahy Scale: Poor Standing balance comment: reliant on single UE support while standing at sink level during grooming ADL                           ADL either performed or assessed with clinical judgement   ADL Overall ADL's : Needs assistance/impaired Eating/Feeding: NPO   Grooming: Oral care;Wash/dry face;Moderate assistance;Standing;Brushing hair Grooming Details (indicate cue type and reason): completed while standing at sink level;gestural cues to use comb and toothbrush;apraxia noted with use of toothbrush to comb hair;demonstrates short rom with combing hair, requires hand over hand assistance for gross movements                 Toilet Transfer: Moderate assistance;RW;Regular Toilet;Ambulation Toilet Transfer Details (indicate cue type and reason): simulated to chair at sink and return to bed         Functional mobility during ADLs: Moderate assistance;Rolling walker General ADL Comments: appeared to demonstrate right side inattention, bumping into items on right side     Vision  Additional Comments: further assessment required;pt appeared to have right side inattention, bumping into things on right side;pt fumbling to grasp toothbrush, undershooting depth  perception, did not gaze down at item when reaching   Perception     Praxis      Cognition Arousal/Alertness: Awake/alert Behavior During Therapy: Flat affect Overall Cognitive Status: Impaired/Different from baseline Area of Impairment: Attention;Following commands;Awareness;Memory;Problem solving                   Current Attention Level: Sustained Memory: Decreased recall of precautions Following Commands: Follows one step commands inconsistently;Follows one step commands with increased time   Awareness: Intellectual Problem Solving: Slow processing;Difficulty sequencing;Requires verbal cues;Requires tactile cues General Comments: demonstrates apraxia wtih attempting to use toothbrush to comb hair;required hand over hand assistance to brush teeth (toothbrush was dry as pt is NPO);appears to demonstrate global communication deficits, follows 1 step commands and requires gestural cues for initiation and task completion;appears to have some right side inattention         Exercises     Shoulder Instructions       General Comments pt on 2lnc throughout session SpO2 93% following ADL completion in standing;husband present throughout session    Pertinent Vitals/ Pain       Pain Assessment: No/denies pain Pain Intervention(s): Monitored during session  Home Living                                          Prior Functioning/Environment              Frequency  Min 2X/week        Progress Toward Goals  OT Goals(current goals can now be found in the care plan section)  Progress towards OT goals: Progressing toward goals  Acute Rehab OT Goals Patient Stated Goal: pt unable to participate due to aphasia OT Goal Formulation: With patient/family Time For Goal Achievement: 09/27/19 Potential to Achieve Goals: Good ADL Goals Pt Will Perform Grooming: with set-up;sitting Pt Will Perform Upper Body Bathing: with set-up;sitting Pt Will Transfer to  Toilet: with min assist;ambulating;regular height toilet Additional ADL Goal #1: pt will follow 2 step command  Plan Discharge plan remains appropriate    Co-evaluation                 AM-PAC OT "6 Clicks" Daily Activity     Outcome Measure   Help from another person eating meals?: Total Help from another person taking care of personal grooming?: A Lot Help from another person toileting, which includes using toliet, bedpan, or urinal?: A Lot Help from another person bathing (including washing, rinsing, drying)?: A Lot Help from another person to put on and taking off regular upper body clothing?: A Lot Help from another person to put on and taking off regular lower body clothing?: A Lot 6 Click Score: 11    End of Session Equipment Utilized During Treatment: Gait belt;Rolling walker;Oxygen(2lnc)  OT Visit Diagnosis: Unsteadiness on feet (R26.81);Muscle weakness (generalized) (M62.81)   Activity Tolerance Patient tolerated treatment well   Patient Left in bed;with call bell/phone within reach;with bed alarm set;with family/visitor present   Nurse Communication Mobility status        Time: 1018-1050 OT Time Calculation (min): 32 min  Charges: OT General Charges $OT Visit: 1 Visit OT Treatments $Self Care/Home Management : 23-37 mins  Dorinda Hill OTR/L Acute Rehabilitation Services  Office: [place] 09/16/2019, 11:18 AM

## 2019-09-16 NOTE — Progress Notes (Signed)
Tele reported a 5 beat run of VT. Dr. Erlinda Hong notified. [place]

## 2019-09-16 NOTE — Care Management Important Message (Signed)
Important Message  Patient Details  Name: April Le MRN: [id number] Date of Birth: August 20, 1941   Medicare Important Message Given:  Yes     Brelyn Woehl Montine Circle 09/16/2019, 4:04 PM

## 2019-09-16 NOTE — Progress Notes (Signed)
  Speech Language Pathology Treatment: Dysphagia;Cognitive-Linquistic  Patient Details Name: April Le MRN: [id number] DOB: Mar 24, 1941 Today's Date: 09/16/2019 Time: 1235-1300 SLP Time Calculation (min) (ACUTE ONLY): 25 min  Assessment / Plan / Recommendation Clinical Impression  Pt seen at bedside to reassess readiness for po intake. Pt's husband and RN were both present. Oral care was completed with suction. Following oral care, pt accepted trials of ice chips, puree, nectar thick liquid, and thin liquid. Pt exhibited discoordinated audible swallow with all textures. Cough response noted only following thin liquids. 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 At this time, NPO status is recommended for primary nutrition, hydration, and med administration. SLP may provide trials of po during dysphagia treatments, and will determine timing/need of instrumental assessment. Husband was educated regarding current recommendations. Continued education will be needed on an ongoing basis. Pt's husband appears very overwhelmed with everything right now. RN and MD were informed of results and recommendations. SLP will continue to follow acutely. Need for continued ST intervention at DC is anticipated.    HPI HPI: April Le is a 78 y.o. female with past medical history significant for congestive heart failure(chronic diastolic heart failure), prior stroke, hypertension, hyperlipidemia and prior stroke with residual mild left leg weakness and mild aphasia. She presented to the emergency department as a code stroke for left gaze deviation, right-sided weakness and aphasia on 09/11/19 when she was found non-verbal with right side weakness and facial droop. Stat CT head was obtained which showed no acute hemorrhage/infarct.  CT head did demonstrate old infarcts most notably left MCA stroke and R PCA stroke.  CT angiogram and CT  perfusion showed left M1 occlusion. Intubated briefly for arteriogram and TICI revascularization.        SLP Plan  Continue with current plan of care       Recommendations  Diet recommendations: NPO Medication Administration: Via alternative means                Oral Care Recommendations: Oral care QID;Staff/trained caregiver to provide oral care Follow up Recommendations: Inpatient Rehab SLP Visit Diagnosis: Dysphagia, unspecified (R13.10);Aphasia (R47.01);Apraxia (R48.2) Plan: Continue with current plan of care       [place], [facility], [place] Speech Language Pathologist Office: [phone]  Shonna Chock 09/16/2019, 1:23 PM

## 2019-09-16 NOTE — Interval H&P Note (Signed)
History and Physical Interval Note:  09/16/2019 12:08 PM  April Le  has presented today for surgery, with the diagnosis of stroke.  The various methods of treatment have been discussed with the patient and family. After consideration of risks, benefits and other options for treatment, the patient has consented to  Procedure(s): TRANSESOPHAGEAL ECHOCARDIOGRAM (TEE) (N/A) as a surgical intervention.  The patient's history has been reviewed, patient examined, no change in status, stable for surgery.  I have reviewed the patient's chart and labs.  Questions were answered to the patient's satisfaction.     Dorris Carnes

## 2019-09-16 NOTE — Discharge Instructions (Signed)

## 2019-09-16 NOTE — Progress Notes (Signed)
Call received from [place], [place] with EP. He advised per neurology patient does not need TEE. Removed from endo schedule for today, Dr Harrington Challenger, notified, primary RN notified.

## 2019-09-17 ENCOUNTER — Inpatient Hospital Stay (HOSPITAL_COMMUNITY)
Admission: RE | Admit: 2019-09-17 | Discharge: 2019-10-09 | DRG: 057 | Disposition: A | Discharge status: Home Health | Payer: Medicare Other | Source: Intra-hospital | Attending: Physical Medicine & Rehabilitation | Admitting: Physical Medicine & Rehabilitation

## 2019-09-17 ENCOUNTER — Other Ambulatory Visit: Payer: Self-pay

## 2019-09-17 ENCOUNTER — Inpatient Hospital Stay (HOSPITAL_COMMUNITY): Payer: Medicare Other

## 2019-09-17 ENCOUNTER — Encounter (HOSPITAL_COMMUNITY): Payer: Self-pay | Admitting: Internal Medicine

## 2019-09-17 DIAGNOSIS — I2510 Atherosclerotic heart disease of native coronary artery without angina pectoris: Secondary | ICD-10-CM | POA: Diagnosis present

## 2019-09-17 DIAGNOSIS — E785 Hyperlipidemia, unspecified: Secondary | ICD-10-CM | POA: Diagnosis present

## 2019-09-17 DIAGNOSIS — D62 Acute posthemorrhagic anemia: Secondary | ICD-10-CM | POA: Diagnosis present

## 2019-09-17 DIAGNOSIS — Z90710 Acquired absence of both cervix and uterus: Secondary | ICD-10-CM

## 2019-09-17 DIAGNOSIS — E46 Unspecified protein-calorie malnutrition: Secondary | ICD-10-CM | POA: Diagnosis present

## 2019-09-17 DIAGNOSIS — G9340 Encephalopathy, unspecified: Secondary | ICD-10-CM | POA: Diagnosis present

## 2019-09-17 DIAGNOSIS — Z841 Family history of disorders of kidney and ureter: Secondary | ICD-10-CM

## 2019-09-17 DIAGNOSIS — R1310 Dysphagia, unspecified: Secondary | ICD-10-CM | POA: Diagnosis present

## 2019-09-17 DIAGNOSIS — R001 Bradycardia, unspecified: Secondary | ICD-10-CM | POA: Diagnosis not present

## 2019-09-17 DIAGNOSIS — I69391 Dysphagia following cerebral infarction: Secondary | ICD-10-CM

## 2019-09-17 DIAGNOSIS — I69390 Apraxia following cerebral infarction: Secondary | ICD-10-CM | POA: Diagnosis not present

## 2019-09-17 DIAGNOSIS — Z6829 Body mass index (BMI) 29.0-29.9, adult: Secondary | ICD-10-CM

## 2019-09-17 DIAGNOSIS — J8410 Pulmonary fibrosis, unspecified: Secondary | ICD-10-CM

## 2019-09-17 DIAGNOSIS — I130 Hypertensive heart and chronic kidney disease with heart failure and stage 1 through stage 4 chronic kidney disease, or unspecified chronic kidney disease: Secondary | ICD-10-CM | POA: Diagnosis present

## 2019-09-17 DIAGNOSIS — G4733 Obstructive sleep apnea (adult) (pediatric): Secondary | ICD-10-CM | POA: Diagnosis present

## 2019-09-17 DIAGNOSIS — I69392 Facial weakness following cerebral infarction: Secondary | ICD-10-CM | POA: Diagnosis not present

## 2019-09-17 DIAGNOSIS — Z79899 Other long term (current) drug therapy: Secondary | ICD-10-CM

## 2019-09-17 DIAGNOSIS — I63512 Cerebral infarction due to unspecified occlusion or stenosis of left middle cerebral artery: Secondary | ICD-10-CM | POA: Diagnosis present

## 2019-09-17 DIAGNOSIS — T801XXA Vascular complications following infusion, transfusion and therapeutic injection, initial encounter: Secondary | ICD-10-CM | POA: Diagnosis not present

## 2019-09-17 DIAGNOSIS — I69351 Hemiplegia and hemiparesis following cerebral infarction affecting right dominant side: Secondary | ICD-10-CM | POA: Diagnosis present

## 2019-09-17 DIAGNOSIS — R2689 Other abnormalities of gait and mobility: Secondary | ICD-10-CM | POA: Diagnosis not present

## 2019-09-17 DIAGNOSIS — Z823 Family history of stroke: Secondary | ICD-10-CM

## 2019-09-17 DIAGNOSIS — N179 Acute kidney failure, unspecified: Secondary | ICD-10-CM

## 2019-09-17 DIAGNOSIS — I69354 Hemiplegia and hemiparesis following cerebral infarction affecting left non-dominant side: Secondary | ICD-10-CM

## 2019-09-17 DIAGNOSIS — M1711 Unilateral primary osteoarthritis, right knee: Secondary | ICD-10-CM | POA: Diagnosis present

## 2019-09-17 DIAGNOSIS — F05 Delirium due to known physiological condition: Secondary | ICD-10-CM | POA: Diagnosis present

## 2019-09-17 DIAGNOSIS — E7800 Pure hypercholesterolemia, unspecified: Secondary | ICD-10-CM | POA: Diagnosis present

## 2019-09-17 DIAGNOSIS — R569 Unspecified convulsions: Secondary | ICD-10-CM | POA: Diagnosis not present

## 2019-09-17 DIAGNOSIS — I5031 Acute diastolic (congestive) heart failure: Secondary | ICD-10-CM | POA: Diagnosis not present

## 2019-09-17 DIAGNOSIS — I10 Essential (primary) hypertension: Secondary | ICD-10-CM | POA: Diagnosis not present

## 2019-09-17 DIAGNOSIS — E876 Hypokalemia: Secondary | ICD-10-CM | POA: Diagnosis not present

## 2019-09-17 DIAGNOSIS — R1312 Dysphagia, oropharyngeal phase: Secondary | ICD-10-CM | POA: Diagnosis not present

## 2019-09-17 DIAGNOSIS — Z8249 Family history of ischemic heart disease and other diseases of the circulatory system: Secondary | ICD-10-CM

## 2019-09-17 DIAGNOSIS — N183 Chronic kidney disease, stage 3 unspecified: Secondary | ICD-10-CM | POA: Diagnosis present

## 2019-09-17 DIAGNOSIS — Z90722 Acquired absence of ovaries, bilateral: Secondary | ICD-10-CM

## 2019-09-17 DIAGNOSIS — I69320 Aphasia following cerebral infarction: Secondary | ICD-10-CM | POA: Diagnosis not present

## 2019-09-17 DIAGNOSIS — K219 Gastro-esophageal reflux disease without esophagitis: Secondary | ICD-10-CM | POA: Diagnosis present

## 2019-09-17 DIAGNOSIS — I5032 Chronic diastolic (congestive) heart failure: Secondary | ICD-10-CM

## 2019-09-17 DIAGNOSIS — Z79890 Hormone replacement therapy (postmenopausal): Secondary | ICD-10-CM

## 2019-09-17 DIAGNOSIS — Z7982 Long term (current) use of aspirin: Secondary | ICD-10-CM

## 2019-09-17 DIAGNOSIS — Z9049 Acquired absence of other specified parts of digestive tract: Secondary | ICD-10-CM

## 2019-09-17 DIAGNOSIS — Z9851 Tubal ligation status: Secondary | ICD-10-CM

## 2019-09-17 DIAGNOSIS — Z87891 Personal history of nicotine dependence: Secondary | ICD-10-CM

## 2019-09-17 DIAGNOSIS — Z7902 Long term (current) use of antithrombotics/antiplatelets: Secondary | ICD-10-CM

## 2019-09-17 DIAGNOSIS — I509 Heart failure, unspecified: Secondary | ICD-10-CM | POA: Diagnosis not present

## 2019-09-17 DIAGNOSIS — E039 Hypothyroidism, unspecified: Secondary | ICD-10-CM | POA: Diagnosis present

## 2019-09-17 DIAGNOSIS — Z880 Allergy status to penicillin: Secondary | ICD-10-CM

## 2019-09-17 DIAGNOSIS — T501X5A Adverse effect of loop [high-ceiling] diuretics, initial encounter: Secondary | ICD-10-CM | POA: Diagnosis not present

## 2019-09-17 DIAGNOSIS — Z800 Family history of malignant neoplasm of digestive organs: Secondary | ICD-10-CM

## 2019-09-17 DIAGNOSIS — G253 Myoclonus: Secondary | ICD-10-CM | POA: Diagnosis present

## 2019-09-17 DIAGNOSIS — I69398 Other sequelae of cerebral infarction: Secondary | ICD-10-CM | POA: Diagnosis not present

## 2019-09-17 DIAGNOSIS — R0602 Shortness of breath: Secondary | ICD-10-CM

## 2019-09-17 DIAGNOSIS — Z8673 Personal history of transient ischemic attack (TIA), and cerebral infarction without residual deficits: Secondary | ICD-10-CM | POA: Diagnosis not present

## 2019-09-17 LAB — GLUCOSE, CAPILLARY
Glucose-Capillary: 163 mg/dL — ABNORMAL HIGH (ref 70–99)
Glucose-Capillary: 153 mg/dL — ABNORMAL HIGH (ref 70–99)
Glucose-Capillary: 153 mg/dL — ABNORMAL HIGH (ref 70–99)
Glucose-Capillary: 158 mg/dL — ABNORMAL HIGH (ref 70–99)

## 2019-09-17 LAB — BASIC METABOLIC PANEL
Anion gap: 8 (ref 5–15)
BUN: 15 mg/dL (ref 8–23)
CO2: 27 mmol/L (ref 22–32)
Calcium: 8.5 mg/dL — ABNORMAL LOW (ref 8.9–10.3)
Chloride: 106 mmol/L (ref 98–111)
Creatinine, Ser: 0.86 mg/dL (ref 0.44–1.00)
GFR calc Af Amer: >60 mL/min (ref >60)
GFR calc non Af Amer: >60 mL/min (ref >60)
Glucose, Bld: 163 mg/dL — ABNORMAL HIGH (ref 70–99)
Potassium: 3.7 mmol/L (ref 3.5–5.1)
Sodium: 141 mmol/L (ref 135–145)

## 2019-09-17 LAB — CBC
HCT: 32.4 % — ABNORMAL LOW (ref 36.0–46.0)
Hemoglobin: 10.8 g/dL — ABNORMAL LOW (ref 12.0–15.0)
MCH: 30.9 pg (ref 26.0–34.0)
MCHC: 33.3 g/dL (ref 30.0–36.0)
MCV: 92.6 fL (ref 80.0–100.0)
Platelets: 171 10*3/uL (ref 150–400)
RBC: 3.5 MIL/uL — ABNORMAL LOW (ref 3.87–5.11)
RDW: 12.8 % (ref 11.5–15.5)
WBC: 7.5 10*3/uL (ref 4.0–10.5)
nRBC: 0 % (ref 0.0–0.2)

## 2019-09-17 LAB — TROPONIN I (HIGH SENSITIVITY): Troponin I (High Sensitivity): 5 ng/L (ref <18)

## 2019-09-17 MED ORDER — ACETAMINOPHEN 325 MG PO TABS: 650.0000 mg | ORAL_TABLET | ORAL | Status: DC | PRN

## 2019-09-17 MED ORDER — FUROSEMIDE 10 MG/ML IJ SOLN
40.0000 mg | Freq: Once | INTRAMUSCULAR | Status: AC
  Administered 2019-09-17: 40 mg via INTRAVENOUS
  Filled 2019-09-17: qty 4

## 2019-09-17 MED ORDER — METOPROLOL TARTRATE 50 MG PO TABS: 50.0000 mg | ORAL_TABLET | Freq: Two times a day (BID) | ORAL | Status: DC

## 2019-09-17 MED ORDER — LEVOTHYROXINE SODIUM 50 MCG PO TABS
50.0000 ug | ORAL_TABLET | Freq: Every day | ORAL | Status: DC
  Administered 2019-09-18 – 2019-09-25 (×8): 50 ug
  Filled 2019-09-17 (×8): qty 1

## 2019-09-17 MED ORDER — METOPROLOL TARTRATE 50 MG PO TABS
50.0000 mg | ORAL_TABLET | Freq: Two times a day (BID) | ORAL | Status: DC
  Administered 2019-09-17 – 2019-10-02 (×29): 50 mg
  Filled 2019-09-17 (×31): qty 1

## 2019-09-17 MED ORDER — DONEPEZIL HCL 5 MG PO TABS: 5.0000 mg | ORAL_TABLET | Freq: Every day | ORAL | Status: DC

## 2019-09-17 MED ORDER — POTASSIUM CHLORIDE 20 MEQ/15ML (10%) PO SOLN: 20.0000 meq | Freq: Every day | ORAL | 0 refills | Status: DC

## 2019-09-17 MED ORDER — FUROSEMIDE 40 MG PO TABS
40.0000 mg | ORAL_TABLET | Freq: Two times a day (BID) | ORAL | Status: DC
  Administered 2019-09-18 – 2019-09-25 (×15): 40 mg
  Filled 2019-09-17 (×16): qty 1

## 2019-09-17 MED ORDER — JEVITY 1.2 CAL PO LIQD: 1000.0000 mL | ORAL | 0 refills | Status: DC

## 2019-09-17 MED ORDER — POTASSIUM CHLORIDE 20 MEQ/15ML (10%) PO SOLN
20.0000 meq | Freq: Every day | ORAL | Status: DC
  Administered 2019-09-17: 20 meq
  Filled 2019-09-17: qty 15

## 2019-09-17 MED ORDER — JEVITY 1.2 CAL PO LIQD
1000.0000 mL | ORAL | Status: DC
  Administered 2019-09-17: 1000 mL
  Filled 2019-09-17 (×2): qty 1000

## 2019-09-17 MED ORDER — FUROSEMIDE 40 MG PO TABS: 40.0000 mg | ORAL_TABLET | Freq: Two times a day (BID) | ORAL | Status: DC

## 2019-09-17 MED ORDER — CHLORHEXIDINE GLUCONATE 0.12 % MT SOLN: 15.0000 mL | Freq: Two times a day (BID) | OROMUCOSAL | 0 refills | Status: DC

## 2019-09-17 MED ORDER — FREE WATER
200.0000 mL | Freq: Four times a day (QID) | Status: DC
  Administered 2019-09-18 – 2019-09-25 (×29): 200 mL

## 2019-09-17 MED ORDER — ACETAMINOPHEN 325 MG PO TABS
650.0000 mg | ORAL_TABLET | ORAL | Status: DC | PRN
  Administered 2019-09-26 – 2019-10-08 (×10): 650 mg via ORAL
  Filled 2019-09-17 (×10): qty 2

## 2019-09-17 MED ORDER — ASPIRIN 325 MG PO TABS: 325.0000 mg | ORAL_TABLET | Freq: Every day | ORAL | Status: DC

## 2019-09-17 MED ORDER — SENNOSIDES-DOCUSATE SODIUM 8.6-50 MG PO TABS: 1.0000 | ORAL_TABLET | Freq: Every evening | ORAL | Status: DC | PRN

## 2019-09-17 MED ORDER — ACETAMINOPHEN 650 MG RE SUPP: 650.0000 mg | RECTAL | Status: DC | PRN

## 2019-09-17 MED ORDER — DONEPEZIL HCL 10 MG PO TABS
5.0000 mg | ORAL_TABLET | Freq: Every day | ORAL | Status: DC
  Administered 2019-09-18 – 2019-10-02 (×14): 5 mg
  Filled 2019-09-17 (×14): qty 1

## 2019-09-17 MED ORDER — LEVOTHYROXINE SODIUM 50 MCG PO TABS: 50.0000 ug | ORAL_TABLET | Freq: Every day | ORAL | Status: DC

## 2019-09-17 MED ORDER — POTASSIUM CHLORIDE 20 MEQ/15ML (10%) PO SOLN
20.0000 meq | Freq: Every day | ORAL | Status: DC
  Administered 2019-09-18 – 2019-09-25 (×8): 20 meq
  Filled 2019-09-17 (×8): qty 15

## 2019-09-17 MED ORDER — CLOPIDOGREL BISULFATE 75 MG PO TABS: 75.0000 mg | ORAL_TABLET | Freq: Every day | ORAL | Status: DC

## 2019-09-17 MED ORDER — ENOXAPARIN SODIUM 40 MG/0.4ML ~~LOC~~ SOLN
40.0000 mg | Freq: Every day | SUBCUTANEOUS | Status: DC
  Administered 2019-09-18 – 2019-10-04 (×17): 40 mg via SUBCUTANEOUS
  Filled 2019-09-17 (×17): qty 0.4

## 2019-09-17 MED ORDER — CLOPIDOGREL BISULFATE 75 MG PO TABS
75.0000 mg | ORAL_TABLET | Freq: Every day | ORAL | Status: DC
  Administered 2019-09-18 – 2019-10-02 (×14): 75 mg
  Filled 2019-09-17 (×14): qty 1

## 2019-09-17 MED ORDER — ENOXAPARIN SODIUM 40 MG/0.4ML ~~LOC~~ SOLN: 40.0000 mg | SUBCUTANEOUS | Status: DC

## 2019-09-17 MED ORDER — SORBITOL 70 % SOLN
30.0000 mL | Freq: Every day | Status: DC | PRN
  Administered 2019-09-17 – 2019-10-04 (×2): 30 mL
  Filled 2019-09-17 (×2): qty 30

## 2019-09-17 MED ORDER — QUETIAPINE FUMARATE 25 MG PO TABS
25.0000 mg | ORAL_TABLET | Freq: Every day | ORAL | Status: DC
  Administered 2019-09-17 – 2019-09-26 (×9): 25 mg
  Filled 2019-09-17 (×11): qty 1

## 2019-09-17 MED ORDER — SENNOSIDES-DOCUSATE SODIUM 8.6-50 MG PO TABS
1.0000 | ORAL_TABLET | Freq: Every evening | ORAL | Status: DC | PRN
  Administered 2019-09-28 – 2019-10-04 (×2): 1
  Filled 2019-09-17 (×2): qty 1

## 2019-09-17 MED ORDER — FUROSEMIDE 40 MG PO TABS
40.0000 mg | ORAL_TABLET | Freq: Two times a day (BID) | ORAL | Status: DC
  Administered 2019-09-17: 40 mg
  Filled 2019-09-17: qty 1

## 2019-09-17 MED ORDER — ATORVASTATIN CALCIUM 20 MG PO TABS: 20.0000 mg | ORAL_TABLET | Freq: Every day | ORAL | Status: DC

## 2019-09-17 MED ORDER — QUETIAPINE FUMARATE 25 MG PO TABS: 25.0000 mg | ORAL_TABLET | Freq: Every day | ORAL | Status: DC

## 2019-09-17 MED ORDER — ATORVASTATIN CALCIUM 10 MG PO TABS
20.0000 mg | ORAL_TABLET | Freq: Every day | ORAL | Status: DC
  Administered 2019-09-18 – 2019-10-02 (×14): 20 mg
  Filled 2019-09-17 (×14): qty 2

## 2019-09-17 MED ORDER — ACETAMINOPHEN 160 MG/5ML PO SOLN
650.0000 mg | ORAL | Status: DC | PRN
  Administered 2019-09-17 – 2019-10-08 (×3): 650 mg
  Filled 2019-09-17 (×3): qty 20.3

## 2019-09-17 MED ORDER — ASPIRIN 325 MG PO TABS
325.0000 mg | ORAL_TABLET | Freq: Every day | ORAL | Status: DC
  Administered 2019-09-18 – 2019-10-02 (×14): 325 mg
  Filled 2019-09-17 (×14): qty 1

## 2019-09-17 MED ORDER — FREE WATER: 200.0000 mL | Freq: Four times a day (QID) | Status: DC

## 2019-09-17 NOTE — H&P (Signed)
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  Per chart review patient lives with spouse.  Two-level home with bed and bath on main level.  Independent with assistive device.  Patient prefers to take sponge baths.  Presented 09/11/2019 nonverbal with right side weakness.  SARS Covid negative, creatinine 1.25, hemoglobin 11.8, WBC 10,700.  Cranial CT scan showed no hemorrhage or visible acute infarction.  Patient did not receive TPA.  CT angiogram of head and neck showed left distal M1 embolism extending into proximal M2 branches with poorly enhancing downstream vessels.  Remote moderate left lateral frontal and upper insular infarction.  CT perfusion showed a 38 cc acute infarct and 56 cc of superimposed penumbra.  Underwent cerebral angiogram with complete revascularization of occluded Lt LMCA M1 segment.  MRI follow-up showed acute cortical infarct involving majority of left MCA distribution.  There was patchy petechial hemorrhage.  Echocardiogram with ejection fraction of 65%.  Lower extremity Dopplers negative for DVT.  Neurology follow-up currently on aspirin 325 mg daily as well as Plavix.  Loop recorder has been placed.  Patient remains on Lovenox for DVT prophylaxis.  Patient is currently n.p.o. with nasogastric tube feeds.  Patient has had episodes of sundowning maintained on Aricept as well as Seroquel.  Patient with episode of shortness of breath respiratory 26 oxygen saturation 97% 09/17/2019 there was some copious secretions.chest x-ray completed showing no active disease patient did receive an extra dose of Lasix and troponin was negative.  Therapy evaluations completed and patient was admitted for a  comprehensive rehab program  Pt has mittens that were placed today, per NT because she pulled out her NGT last night requiring a Coretrak, and kept attempting to pull out IVs. Her husband was at bedside and said she's been listening to him "some". Shook her head no when asked about pain, but 90% of time shook her head no to any question.   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 ([place]) 10/28/2015   Hypercholesteremia    Hypertension    Hypertensive heart disease with heart failure ([place]) 01/10/2017   Hypothyroidism    Hypoxia 09/04/2013   Leukocytosis 09/04/2013   Mass of ovary 10/26/2011   OSA (obstructive sleep apnea) 11/19/2013   Pneumonia    Shortness of  breath    WITH EXERTION   Sinus drainage    PT STARTED ANTIBIOTIC 11/17/11 --IS HOARSE TODAY, SOME WHEEZING AND COUGH WITH YELLOW DRAINAGE   SIRS (systemic inflammatory response syndrome) (HCC) 09/04/2013   SOB (shortness of breath) 09/04/2013   Stroke ([place]) 02/2007, 08/2009   2 total -RESIDUAL WEAKNESS LEFT LEG--AND APHASIA   Stroke ([place])  09/01/2017   Stroke ([place]) 09/08/2017   Tumor of ovary    Urinary incontinence    Urinary incontinence    Weakness of left leg    Mildly, post stroke   Past Surgical History:  Procedure Laterality Date   ABDOMINAL HYSTERECTOMY     CHOLECYSTECTOMY     IR CT HEAD LTD  09/11/2019   IR PERCUTANEOUS ART THROMBECTOMY/INFUSION INTRACRANIAL INC DIAG ANGIO  09/11/2019   LAPAROTOMY  11/22/2011   Procedure: EXPLORATORY LAPAROTOMY;  Surgeon: Imagene Gurney A. Alycia Rossetti, MD;  Location: [facility];  Service: Gynecology;  Laterality: N/A;   MENISCUS REPAIR     RADIOLOGY WITH ANESTHESIA N/A 09/11/2019   Procedure: IR WITH ANESTHESIA;  Surgeon: Radiologist, Medication, MD;  Location: [facility];  Service: Radiology;  Laterality: N/A;   SALPINGOOPHORECTOMY  11/22/2011   Procedure: SALPINGO OOPHERECTOMY;  Surgeon: Imagene Gurney A. Alycia Rossetti, MD;  Location: [facility];  Service: Gynecology;  Laterality: Bilateral;   TOE SURGERY     TONSILLECTOMY     TUBAL LIGATION     Family History  Problem Relation Age of Onset   Kidney failure Mother    Heart attack Father    Heart failure Father    Stomach cancer Other    Stomach cancer Maternal Grandmother    Stroke Paternal Grandmother    Social History:  reports that she quit smoking about 59 years ago. Her smoking use included cigarettes. She has never used smokeless tobacco. She reports current alcohol use. She reports that she does not use drugs. Allergies:  Allergies  Allergen Reactions   Amoxicillin-Pot Clavulanate Diarrhea    GI INTOLERANCE ONLY WITH DIARRHEA   Medications Prior to Admission  Medication Sig Dispense Refill   acetaminophen (TYLENOL) 500 MG tablet Take 500 mg by mouth every 6 (six) hours as needed for mild pain.     aspirin 81 MG tablet Take 81 mg by mouth daily with breakfast.      atorvastatin (LIPITOR) 40 MG tablet Take 40 mg by mouth at bedtime.      clopidogrel (PLAVIX) 75 MG tablet Take 75 mg by mouth daily.      donepezil (ARICEPT) 5  MG tablet Take 5 mg by mouth daily.     febuxostat (ULORIC) 40 MG tablet Take 40 mg by mouth daily.      furosemide (LASIX) 40 MG tablet Take 1 tablet (40 mg total) by mouth 2 (two) times daily. 180 tablet 1   hydrALAZINE (APRESOLINE) 25 MG tablet Take 0.5 tablet (12.5 mg) twice daily if your systolic blood pressure (top number) is consistently greater than 140 (Patient taking differently: Take 25 mg by mouth 2 (two) times daily. Take 0.5 tablet (12.5 mg) twice daily if your systolic blood pressure (top number) is consistently greater than 140) 60 tablet 3   levothyroxine (SYNTHROID, LEVOTHROID) 50 MCG tablet Take 50 mcg by mouth daily at 6 (six) AM.      metoprolol (LOPRESSOR) 50 MG tablet Take 50 mg by mouth 2 (two) times daily.      Multiple Vitamins-Minerals (MULTI-VITAMIN GUMMIES PO) Take 1 tablet by mouth daily.     pantoprazole (  PROTONIX) 40 MG tablet Take 40 mg by mouth daily.      potassium chloride SA (K-DUR,KLOR-CON) 20 MEQ tablet Take 20 mEq by mouth daily.       Drug Regimen Review Drug regimen was reviewed and remains appropriate with no significant issues identified  Home: Home Living Family/patient expects to be discharged to:: Private residence Living Arrangements: Spouse/significant other Available Help at Discharge: Family, Available 24 hours/day, Other (Comment) Type of Home: House Home Access: Level entry Home Layout: Two level, Able to live on main level with bedroom/bathroom Alternate Level Stairs-Number of Steps: flight Alternate Level Stairs-Rails: Right, Left Bathroom Shower/Tub: Walk-in Radio producer: Handicapped height Bathroom Accessibility: Yes Home Equipment: Environmental consultant - 2 wheels, Cane - single point, Civil engineer, contracting, Wheelchair - manual Additional Comments: daughter present at eval   Functional History: Prior Function Level of Independence: Independent with assistive device(s) Comments: pt takes more sponge baths than showers, but has been  able to shower alone, go to bathroom without assist, brush teeth and donn clothes independently  Functional Status:  Mobility: Bed Mobility Overal bed mobility: Needs Assistance Bed Mobility: Supine to Sit, Sit to Supine Supine to sit: Min guard Sit to supine: Min guard General bed mobility comments: Min guard for safety, cues and encouragement to bring trunk up and scoot to EOB Transfers Overall transfer level: Needs assistance Equipment used: None Transfers: Sit to/from Stand Sit to Stand: Min assist, +2 physical assistance General transfer comment: MinAx2 for boost to full standing position, mild posterior bias but able to correct with MinA Ambulation/Gait Ambulation/Gait assistance: Mod assist, +2 physical assistance Gait Distance (Feet): 40 Feet Assistive device: 2 person hand held assist Gait Pattern/deviations: Step-through pattern, Decreased step length - left, Decreased step length - right, Decreased stride length, Shuffle, Trunk flexed, Drifts right/left, Wide base of support General Gait Details: wide BOS and ModAx2 for safety and balance; patient frequently letting go of R UE and grabbing for wall, cues to improve step length and balance provided. Fatigued once out in hallway, non-verbally requested return to bed Gait velocity: slow Gait velocity interpretation: <1.31 ft/sec, indicative of household ambulator    ADL: ADL Overall ADL's : Needs assistance/impaired Eating/Feeding: NPO Eating/Feeding Details (indicate cue type and reason): pt currently with feeding tube- daughter with questions about deating again  Grooming: Oral care, Wash/dry face, Moderate assistance, Standing, Brushing hair Grooming Details (indicate cue type and reason): completed while standing at sink level;gestural cues to use comb and toothbrush;apraxia noted with use of toothbrush to comb hair;demonstrates short rom with combing hair, requires hand over hand assistance for gross movements Upper Body  Bathing: Moderate assistance, Sitting Lower Body Bathing: Total assistance Toilet Transfer: Moderate assistance, RW, Regular Toilet, Ambulation Toilet Transfer Details (indicate cue type and reason): simulated to chair at sink and return to bed Toileting- Clothing Manipulation and Hygiene: Total assistance Functional mobility during ADLs: Moderate assistance, Rolling walker General ADL Comments: appeared to demonstrate right side inattention, bumping into items on right side  Cognition: Cognition Overall Cognitive Status: Impaired/Different from baseline Arousal/Alertness: Awake/alert Orientation Level: Oriented to person Attention: Sustained Sustained Attention: Appears intact Memory: Impaired Memory Impairment: Decreased recall of new information(Place) Awareness: Impaired Awareness Impairment: Intellectual impairment Problem Solving: (UTA) Executive Function: Initiating Initiating: Impaired Initiating Impairment: Verbal basic, Functional basic Safety/Judgment: Impaired Cognition Arousal/Alertness: Awake/alert Behavior During Therapy: Flat affect Overall Cognitive Status: Impaired/Different from baseline Area of Impairment: Attention, Following commands, Awareness, Memory, Problem solving Current Attention Level: Sustained Memory: Decreased recall of precautions, Decreased  short-term memory Following Commands: Follows one step commands inconsistently, Follows one step commands with increased time Awareness: Intellectual Problem Solving: Slow processing, Difficulty sequencing, Requires verbal cues, Requires tactile cues General Comments: ongoing apraxia but improving with gross gait and transfer tasks, some R inattention, did well with gestures. Seemed motivated to get up with PT  Physical Exam: Blood pressure 132/77, pulse 61, temperature 98.4 F (36.9 C), temperature source Oral, resp. rate 20, height 5' (1.524 m), weight 72.2 kg, SpO2 95 %. Physical Exam  Nursing note and  vitals reviewed. Constitutional:  Elderly female sitting in chair at bedside; husband and NT at side, on TFs continuous, not bolus, wearing B/L mittens, NAD; appears a little frail; global vs expressive aphasia  HENT:  Head: Normocephalic and atraumatic.  Nasogastric tube in place./Coretrak on R nare  Eyes:  Wasn't able to comply with most of cranial nerve exam- appeared to follow/track me around the room, didn't see nystagmus; R facial droop- refused/unable to stick tongue out.   Neck: Normal range of motion. Neck supple. No tracheal deviation present.  Cardiovascular:  RRR  Respiratory:  Good air movement B/L - sounds a little congested when coughed audibly, but clear when listened- No W/R/R  GI:  Soft, NT, ND, (+)BS- LBM this AM per husband  Musculoskeletal:     Comments: Was able to grip my hands B/L through mittens and wiggled toes B/L when asked, but unable to either understand/comply when asked to lift either leg or arm.  Good ROM passively.   Neurological:  Patient is alert but has expressive vs global  aphasia which limited overall exam.  There was a right facial droop. Cannot test sensation due to aphasia and pt unable to comply with coordination exam.   Skin: Skin is warm and dry.  Significant large bruising on upper arms- IVs. Vs blood draws  Psychiatric:  Global vs receptive aphasia    Results for orders placed or performed during the hospital encounter of 09/11/19 (from the past 48 hour(s))  Glucose, capillary     Status: Abnormal   Collection Time: 09/15/19  8:15 AM  Result Value Ref Range   Glucose-Capillary 128 (H) 70 - 99 mg/dL   Comment 1 Notify RN    Comment 2 Document in Chart   Glucose, capillary     Status: Abnormal   Collection Time: 09/15/19 12:34 PM  Result Value Ref Range   Glucose-Capillary 115 (H) 70 - 99 mg/dL   Comment 1 Notify RN    Comment 2 Document in Chart   Glucose, capillary     Status: Abnormal   Collection Time: 09/15/19  4:51 PM    Result Value Ref Range   Glucose-Capillary 117 (H) 70 - 99 mg/dL   Comment 1 Notify RN    Comment 2 Document in Chart   Glucose, capillary     Status: Abnormal   Collection Time: 09/15/19  9:01 PM  Result Value Ref Range   Glucose-Capillary 124 (H) 70 - 99 mg/dL  Glucose, capillary     Status: Abnormal   Collection Time: 09/15/19 11:39 PM  Result Value Ref Range   Glucose-Capillary 109 (H) 70 - 99 mg/dL  Basic metabolic panel     Status: Abnormal   Collection Time: 09/16/19  3:35 AM  Result Value Ref Range   Sodium 141 135 - 145 mmol/L   Potassium 3.7 3.5 - 5.1 mmol/L   Chloride 105 98 - 111 mmol/L   CO2 27 22 - 32 mmol/L  Glucose, Bld 113 (H) 70 - 99 mg/dL   BUN 13 8 - 23 mg/dL   Creatinine, Ser 0.96 0.44 - 1.00 mg/dL   Calcium 8.7 (L) 8.9 - 10.3 mg/dL   GFR calc non Af Amer 56 (L) >60 mL/min   GFR calc Af Amer >60 >60 mL/min   Anion gap 9 5 - 15    Comment: Performed at [facility] [address], [place], [place] [address]  CBC     Status: Abnormal   Collection Time: 09/16/19  3:35 AM  Result Value Ref Range   WBC 7.5 4.0 - 10.5 K/uL   RBC 3.49 (L) 3.87 - 5.11 MIL/uL   Hemoglobin 10.8 (L) 12.0 - 15.0 g/dL   HCT 31.6 (L) 36.0 - 46.0 %   MCV 90.5 80.0 - 100.0 fL   MCH 30.9 26.0 - 34.0 pg   MCHC 34.2 30.0 - 36.0 g/dL   RDW 12.5 11.5 - 15.5 %   Platelets 197 150 - 400 K/uL   nRBC 0.0 0.0 - 0.2 %    Comment: Performed at [facility], [place] [address] [place] [address]  Glucose, capillary     Status: Abnormal   Collection Time: 09/16/19  4:11 AM  Result Value Ref Range   Glucose-Capillary 108 (H) 70 - 99 mg/dL  Glucose, capillary     Status: Abnormal   Collection Time: 09/16/19  8:12 AM  Result Value Ref Range   Glucose-Capillary 120 (H) 70 - 99 mg/dL  Glucose, capillary     Status: Abnormal   Collection Time: 09/16/19  4:20 PM  Result Value Ref Range   Glucose-Capillary 109 (H) 70 - 99 mg/dL  Glucose, capillary     Status: Abnormal    Collection Time: 09/16/19  8:00 PM  Result Value Ref Range   Glucose-Capillary 149 (H) 70 - 99 mg/dL  Glucose, capillary     Status: Abnormal   Collection Time: 09/16/19 11:05 PM  Result Value Ref Range   Glucose-Capillary 150 (H) 70 - 99 mg/dL  Glucose, capillary     Status: Abnormal   Collection Time: 09/17/19  3:11 AM  Result Value Ref Range   Glucose-Capillary 163 (H) 70 - 99 mg/dL  Basic metabolic panel     Status: Abnormal   Collection Time: 09/17/19  3:21 AM  Result Value Ref Range   Sodium 141 135 - 145 mmol/L   Potassium 3.7 3.5 - 5.1 mmol/L   Chloride 106 98 - 111 mmol/L   CO2 27 22 - 32 mmol/L   Glucose, Bld 163 (H) 70 - 99 mg/dL   BUN 15 8 - 23 mg/dL   Creatinine, Ser 0.86 0.44 - 1.00 mg/dL   Calcium 8.5 (L) 8.9 - 10.3 mg/dL   GFR calc non Af Amer >60 >60 mL/min   GFR calc Af Amer >60 >60 mL/min   Anion gap 8 5 - 15    Comment: Performed at [facility], [place] [address] [place], [place] [address]  CBC     Status: Abnormal   Collection Time: 09/17/19  3:21 AM  Result Value Ref Range   WBC 7.5 4.0 - 10.5 K/uL   RBC 3.50 (L) 3.87 - 5.11 MIL/uL   Hemoglobin 10.8 (L) 12.0 - 15.0 g/dL   HCT 32.4 (L) 36.0 - 46.0 %   MCV 92.6 80.0 - 100.0 fL   MCH 30.9 26.0 - 34.0 pg   MCHC 33.3 30.0 - 36.0  g/dL   RDW 12.8 11.5 - 15.5 %   Platelets 171 150 - 400 K/uL   nRBC 0.0 0.0 - 0.2 %    Comment: Performed at [facility], [place] [address] [place], [place] [address]   Dg Abd 1 View  Result Date: 09/16/2019 CLINICAL DATA:  Ten French enteric feeding tube placed via fluoroscopy using 15 mL of Omnipaque 300. EXAM: ABDOMEN - 1 VIEW COMPARISON:  KUB 10/28/2016 FINDINGS: Dobbhoff feeding tube courses through the stomach and duodenum with tip over the proximal jejunum left of midline in the upper abdomen. Contrast is seen filling the proximal jejunum. Bowel gas pattern otherwise unremarkable. Mild degenerate change of the spine. Stable compression fracture over the upper lumbar  spine. IMPRESSION: Nonobstructive bowel gas pattern. Dobbhoff feeding tube with tip over the proximal jejunum in the left mid to upper abdomen. Electronically Signed   By: Marin Olp M.D.   On: 09/16/2019 16:16   Dg Chest Port 1 View  Result Date: 09/16/2019 CLINICAL DATA:  Stroke, pulmonary fibrosis, history hypertension, CHF, chronic kidney disease EXAM: PORTABLE CHEST 1 VIEW COMPARISON:  Portable exam 1754 hours compared to 10/29/2016 FINDINGS: Feeding tube extends into stomach. Ovoid device projects over heart question loop recorder. Enlargement of cardiac silhouette with slight vascular congestion. Mediastinal contours normal. Lungs clear. No pleural effusion or pneumothorax. IMPRESSION: Enlargement of cardiac silhouette with vascular congestion. No acute infiltrates. Electronically Signed   By: Lavonia Dana M.D.   On: 09/16/2019 18:21       Medical Problem List and Plan: 1.  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  Status post loop recorder placement 2.  Antithrombotics: -DVT/anticoagulation: Lovenox.  -antiplatelet therapy: Aspirin 325 mg daily, Plavix 75 mg daily 3. Pain Management: Tylenol as needed 4. Mood: Aricept 5 mg daily, Seroquel 25 mg nightly  -antipsychotic agents: seroquel QHS for sundowning 5. Neuropsych: This patient is not capable of making decisions on her own behalf. 6. Skin/Wound Care: Routine skin checks 7. Fluids/Electrolytes/Nutrition: Routine in and outs with follow-up chemistries 8.  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  Monitor for any signs of fluid overload.  Lasix 40 mg twice daily Monitor daily weights 10.  Dysphagia.  Nasogastric tube feeds; is NPO- if Coretrak needs to come out due to d/c, will need to place PEG.   Follow-up speech therapy 10.  CKD stage III.  Follow-up chemistries 11.  Hypertension.  Lopressor 50 mg twice daily.  Monitor with increased mobility 12.  Hypothyroidism.  Synthroid 13.  Hyperlipidemia.  Lipitor 14. Aphasia- global vs receptive aphasia with confusion- will have pt evaluated by SLP- might benefit from Amantadine? 15. Low grade temp- pt had low grade temp of 100.3 today- will monitor for signs of infection.   Lavon Paganini Angiulli, PA-C 09/17/2019

## 2019-09-17 NOTE — Progress Notes (Signed)
Physical Therapy Treatment Patient Details Name: April Le MRN: [id number] DOB: [birth year] Today's Date: 09/17/2019    History of Present Illness April Le is a 78 y.o. female with past medical history significant for congestive heart failure(chronic diastolic heart failure), prior stroke, hypertension, hyperlipidemia and prior stroke with residual mild left leg weakness and mild aphasia. She presented to the emergency department as a code stroke for left gaze deviation, right-sided weakness and aphasia on 09/11/19 when she was found non-verbal with right side weakness and facial droop. Stat CT head was obtained which showed no acute hemorrhage/infarct.  CT head did demonstrate old infarcts most notably left MCA stroke and R PCA stroke.  CT angiogram and CT perfusion showed left M1 occlusion. Intubated briefly for arteriogram and TICI revascularization.      PT Comments    Patient seen for mobility progression. Pt requires min/mod A +2 for OOB mobility. Pt on 3L O2 via [place] and SpO2 93% while mobilizing. Pt continues to present with R side weakness and aphasia. Continue to recommend CIR for further skilled PT services to maximize independence and safety with mobility.     Follow Up Recommendations  CIR;Supervision/Assistance - 24 hour     Equipment Recommendations  Other (comment)(defer)    Recommendations for Other Services       Precautions / Restrictions Precautions Precautions: Fall Restrictions Weight Bearing Restrictions: No    Mobility  Bed Mobility Overal bed mobility: Needs Assistance Bed Mobility: Supine to Sit     Supine to sit: Min guard     General bed mobility comments: Min guard for safety; use of rail  Transfers Overall transfer level: Needs assistance Equipment used: Rolling walker (2 wheeled) Transfers: Sit to/from Stand Sit to Stand: Min assist;+2 physical assistance         General transfer comment: pt stood X 2 from  EOB  Ambulation/Gait Ambulation/Gait assistance: Mod assist;+2 safety/equipment;Min assist Gait Distance (Feet): 50 Feet Assistive device: Rolling walker (2 wheeled) Gait Pattern/deviations: Step-through pattern;Decreased step length - left;Decreased step length - right;Decreased stride length;Shuffle;Trunk flexed Gait velocity: slow   General Gait Details: multimodal cues for increased bilat step lengths, upright posture, and safe use of AD; pt with tendency to keep RW too far away and taking very short steps expecially with R LE; assistance required to manage AD   Stairs             Wheelchair Mobility    Modified Rankin (Stroke Patients Only) Modified Rankin (Stroke Patients Only) Pre-Morbid Rankin Score: Moderate disability Modified Rankin: Moderately severe disability     Balance Overall balance assessment: Needs assistance Sitting-balance support: No upper extremity supported;Feet supported Sitting balance-Leahy Scale: Fair     Standing balance support: Bilateral upper extremity supported;During functional activity Standing balance-Leahy Scale: Poor Standing balance comment: reliant on external support                            Cognition Arousal/Alertness: Awake/alert Behavior During Therapy: Flat affect Overall Cognitive Status: Difficult to assess                                 General Comments: pt with aphasia and apraxia; pt following single step commands inconsistently and needs tactile and visual cues for some tasks      Exercises      General Comments General comments (skin integrity, edema, etc.): pt on  3L O2 via [place] and SpO2 93% while mobilizing      Pertinent Vitals/Pain Pain Assessment: Faces Faces Pain Scale: No hurt    Home Living                      Prior Function            PT Goals (current goals can now be found in the care plan section) Progress towards PT goals: Progressing toward goals     Frequency    Min 4X/week      PT Plan Current plan remains appropriate    Co-evaluation              AM-PAC PT "6 Clicks" Mobility   Outcome Measure  Help needed turning from your back to your side while in a flat bed without using bedrails?: A Little Help needed moving from lying on your back to sitting on the side of a flat bed without using bedrails?: A Little Help needed moving to and from a bed to a chair (including a wheelchair)?: A Little Help needed standing up from a chair using your arms (e.g., wheelchair or bedside chair)?: A Little Help needed to walk in hospital room?: A Lot Help needed climbing 3-5 steps with a railing? : A Lot 6 Click Score: 16    End of Session Equipment Utilized During Treatment: Gait belt;Oxygen Activity Tolerance: Patient tolerated treatment well Patient left: with call bell/phone within reach;with family/visitor present;in chair;with chair alarm set;with restraints reapplied(bilat mitts) Nurse Communication: Mobility status PT Visit Diagnosis: Unsteadiness on feet (R26.81);Other symptoms and signs involving the nervous system (R29.898);Difficulty in walking, not elsewhere classified (R26.2)     Time: [id number] PT Time Calculation (min) (ACUTE ONLY): 31 min  Charges:  $Gait Training: 23-37 mins                     Earney Navy, PTA Acute Rehabilitation Services Pager: [phone] Office: [phone]     Darliss Cheney 09/17/2019, 11:45 AM

## 2019-09-17 NOTE — Progress Notes (Signed)
Inpatient Rehabilitation Admissions Coordinator  I have received approval from insurance for Cir admit today. I spoke with pt's spouse as well as daughter, [name] by phone. They are in agreement to admit. I clarified again that we allow one visitor per rehab stay. I have notified Burnetta Sabin, [facility] and will make the arrangements to admit today.   Danne Baxter, RN, MSN Rehab Admissions Coordinator [phone] 09/17/2019 5:03 PM

## 2019-09-17 NOTE — Progress Notes (Signed)
Patient SOB. O2 sat 97%. RR 26. Patient has labored breathing. Dr. Erlinda Hong notified and come to bedside. [place]

## 2019-09-17 NOTE — Progress Notes (Signed)
STROKE TEAM PROGRESS NOTE   INTERVAL HISTORY Pt lying in bed, not in acute distress, no SOB. Oral secretion suctioned. Still has global aphasia only following commands of "closing eyes". Moving all extremities. She slept well last night. Troponin neg. CXR neg. Pending CIR.  OBJECTIVE Vitals:   09/17/19 0040 09/17/19 0505 09/17/19 0832 09/17/19 1135  BP: (!) 158/69 132/77 (!) 145/122 (!) 160/59  Pulse: 61 61 76 (!) 51  Resp: 20 20 20 20   Temp: 97.8 F (36.6 C) 98.4 F (36.9 C) 100.3 F (37.9 C) 99.4 F (37.4 C)  TempSrc: Oral Oral Oral Oral  SpO2: 96% 95% 90% 95%  Weight:      Height:        CBC:  Recent Labs  Lab 09/11/19 0817 09/12/19 0507  09/16/19 0335 09/17/19 0321  WBC 10.7* 10.6*   < > 7.5 7.5  NEUTROABS 8.8* 8.8*  --   --   --   HGB 11.8* 11.1*   < > 10.8* 10.8*  HCT 35.8* 33.1*   < > 31.6* 32.4*  MCV 93.0 92.5   < > 90.5 92.6  PLT 220 140*   < > 197 171   < > = values in this interval not displayed.    Basic Metabolic Panel:  Recent Labs  Lab 09/16/19 0335 09/17/19 0321  NA 141 141  K 3.7 3.7  CL 105 106  CO2 27 27  GLUCOSE 113* 163*  BUN 13 15  CREATININE 0.96 0.86  CALCIUM 8.7* 8.5*    Lipid Panel:     Component Value Date/Time   CHOL 116 09/12/2019 0507   TRIG 169 (H) 09/12/2019 0507   HDL 39 (L) 09/12/2019 0507   CHOLHDL 3.0 09/12/2019 0507   VLDL 34 09/12/2019 0507   LDLCALC 43 09/12/2019 0507   HgbA1c:  Lab Results  Component Value Date   HGBA1C 6.7 (H) 09/12/2019   Urine Drug Screen: No results found for: LABOPIA, COCAINSCRNUR, LABBENZ, AMPHETMU, THCU, LABBARB  Alcohol Level No results found for: [place] Code Stroke Wo Contrast 09/11/2019 IMPRESSION:  1. Hyperdense distal left M1 segment suggesting acute thrombosis. No hemorrhage or visible acute infarct. There has been a remote moderate left MCA branch infarct at the lateral frontal lobe.  2. Advanced chronic small vessel ischemia in the cerebral white matter.  There is also been remote right occipital infarct.   Ct Code Stroke Cta Head W/wo Contrast Ct Code Stroke CtaNeckW/wo Contrast Ct Code Stroke Cta Cerebral Perfusion W/wo Contrast 09/11/2019 IMPRESSION:  1. Left distal M1 embolism extending into proximal M2 branches with poorly enhancing downstream vessels.There has been a remote moderate left lateral frontal and upper insular infarct; CT perfusion maps show38 cc of completed acute infarct and 56 cc of superimposed penumbra.  2. Limited atherosclerosis for age. No underlying flow limiting stenosis or embolic source identified.   [place] Ir Percutaneous Art Thrombectomy/infusion Intracranial Inc Diag Angio 09/12/2019 IMPRESSION:  Status post endovascular complete revascularization of occluded left middle cerebral artery M1 segment with 1 pass with the Embotrap 5 mm x 33 mm retrieval device, and 1 pass with the Solitaire 4 mm x 40 mm X retrieval device, and adjunct Penumbra aspiration achieving aTICI 3 revascularization.  PLAN: Follow-up in clinic 4 weeks post discharge.   Mr Brain Wo Contrast 09/12/2019 IMPRESSION:  1. Acute cortical infarct involving the majority of the left MCA distribution,sparing the upper perirolandic cortex and the superficial left temporal lobe. There  is patchy petechial hemorrhage.  2.Remote left MCA and right PCA branch infarcts. Extensive chronic small vessel ischemia.  Transthoracic Echocardiogram  09/12/2019 1. The patient refused the complete study. Globally EF appears normal ~65%.RV functional appears globally normal. Unable to provide much more information than that on this limited study. 2. Left ventricular ejection fraction, by visual estimation, is 60 to 65%.The left ventricle has normal function. There is mildly increased left ventricular hypertrophy. 3. Left ventricular diastolic parameters are consistent with Grade I diastolic dysfunction (impaired relaxation). 4. Global right  ventricle has normal systolic function.The right ventricular size is normal. No increase in right ventricular wall thickness. 5. Left atrial size was not assessed. 6. Right atrial size was not assessed. 7. Presence of pericardial fat pad. 8. Mild mitral annular calcification. 9. The mitral valve was not assessed. not assessed mitral valve regurgitation. 10. The tricuspid valve is not assessed. Tricuspid valve regurgitation not assessed. 11. The aortic valve was not assessed. Aortic valve regurgitation is mild. 12. The pulmonic valve was not assessed. Pulmonic valve regurgitation not assessed. 13. Aortic root could not be assessed. 14. The interatrial septum was not assessed.  Vas [place] Lower Extremity Venous (dvt) 09/14/2019 Summary:  Right: There is no evidence of deep vein thrombosis in the lower extremity.  Left: There is no evidence of deep vein thrombosis in the lower extremity. Final  ECG - SR rate 70 BPM. (See cardiology reading for complete details)  PHYSICAL EXAM    Temp:  [97.7 F (36.5 C)-100.3 F (37.9 C)] 99.4 F (37.4 C) (11/03 1135) Pulse Rate:  [51-76] 51 (11/03 1135) Resp:  [18-20] 20 (11/03 1135) BP: (132-185)/(59-131) 160/59 (11/03 1135) SpO2:  [90 %-97 %] 95 % (11/03 1135)  General - Well nourished, well developed, in no apparent distress.  Ophthalmologic - fundi not visualized due to noncooperation.  Cardiovascular - Regular rhythm and rate.  Neuro -awake, alert, largely global aphasia except able to close eyes and open mouth as requested.  Not follow other simple commands and word salad.  No gaze preference, attending to both sides, EOMI, PERRL.  Blinking to visual threat bilaterally.  Significant right facial droop.  Tongue protrusion not corporative.  Moving all extremities symmetrically and against gravity.  DTR 1+, no Babinski. Sensation, coordination and gait not tested.   ASSESSMENT/PLAN April Le is a 78 y.o. female with history of  significant for congestive heart failure(chronic diastolic heart failure), prior stroke, hypertension, hyperlipidemia and prior stroke with mild left leg weakness and mild aphasia" emergency department as a code stroke for left gaze deviation, right-sided weakness and aphasia secondary to left MCA stroke. Out of the tPA window. Sent to IR for L M1 occlusion.   Stroke: left MCA acute infarct due to left M1 occlusions/p IR with TICI 3 revascularization w/ resultant petechial hemorrhage, embolic pattern, source unclear.   Code Stroke CT Head - hyperdense L M1.No acute infarct. Old L MCA infarct L lateral frontal lobe.Old R occipital infarct.  CTA H&N -left M1 occlusion into M2 branches.  CT Perfusion - 38 cc core with 56 cc penumbra.  Cerebral angio occlusion L MCA M1 w/ TICI3 revascularization  Post IR CT no ICH   MRI L MCA cortical infarct, patchy petechial hemorrhage. Old L MCA and R PCA infarcts  2D Echo - EF 65%%. No source of embolus   Le venous Doppler no DVT  10/2017 30-day cardiac event monitor negative for A. Fib  Loop recorder placed 09/16/2019 to further rule out  A. fib  LDL 43   HgbA1c5.7  VTE prophylaxis - Lovenox 40 mg sq daily   ASA 81mg  + Plavix prior to admission, now on aspirin 325 and Plavix. Continue at d/c.  Therapy recommendations: CIR  Disposition: pending   Hx stroke/TIA ? 10/2017 30-day Cardiac event monitor no A. fib ? 2016 seen by [name] in the office - resultant gait d/o and dysarthria due to extensive small vessel disease, no acute infarct on MRI. On Aggrenox ? 08/2009 - pt reported stroke, details not available in EPIC ? 02/2007 - pt reported stroke, details not available in EPIC  Hypertension  Home BP meds: Apresoline, lasix, lopressor  Current BP meds: Lopressor and Lasix  BP goal to < 180  BP stable   Long-term BP goal normotensive  Hyperlipidemia  Home Lipid lowering medication: Lipitor 40  LDL 43, goal <  70  Current lipid lowering medication: Lipitor 20 mg daily - will not do a higher intensity statin give low LDL and age    Continue statin at discharge  Dysphagia Malnutrition  Secondary to stroke  Failed bedside swallow - NPO  Speech on board  Did not pass MBSS  cortrak clogged - replaced 11/2    On TF @ 55 and free water 200 Q6     Sundowning   Consecutive for3nights  Received haldolfor the last 2 nights  On seroquel 25mg  Qhs.   Pt slept well last night  SOB, resolved Pulmonary fibrosis   RR 26 and O2 sat 97%  Copious oral secretions, SOB improved w/ suctioning.   Aspiration not suspected.   CXR 11/2 w/ vascular congestion - Repeat CXR today no acute abnormality  d/c IVF, give additional lasix  EKG sinus brady  troponin normal   Other Stroke Risk Factors  Advanced age  Chronic diastolic congestive Heart Failure  Other Active Problems  Leukocytosis 10.6->7.8->7.2->7.5->7.5   Acute blood loss anemia 12.9-11.8-11.1->10.2->9.9->10.8->10.8   Mild thrombocytopenia - 138->180->197->171  Asymptomatic run VT x 5 on 11/2  Hospital day # 6  Rosalin Hawking, MD PhD Stroke Neurology 09/17/2019 3:30 PM   To contact Stroke Continuity provider, please refer to [email or web address] After hours, contact General Neurology

## 2019-09-17 NOTE — Progress Notes (Signed)
Pt admitted to 4W06. Pt alert and oriented but aphasic. No complaints of pain. Bed alarm in place.

## 2019-09-17 NOTE — Plan of Care (Signed)
RN reported pt had SOB, RR 26 and O2 97%, however, with copious oral secretions. Suctioned mouth and pt symptoms much improved. No aspiration from tube feeding suspected. Pt now awake alert, following central commands but not peripheral commands but able to mimic or pantomime. No SOB but yesterday evening CXR showed vascular congestion. Given her hx of pulmonary fibrosis, will repeat CXR, d/c IVF, give extra lasix 40mg , and check EKG and troponin. Pt currently stable, if above work up unremarkable, pt can still be d/c to CIR today.   Rosalin Hawking, MD PhD Stroke Neurology 09/17/2019 9:45 AM

## 2019-09-17 NOTE — Progress Notes (Signed)
Patient transferring to CIR. Report has been called. All belongings sent with patient. Family has been notified. [place]

## 2019-09-17 NOTE — Progress Notes (Signed)
[name], MD  Physician  Physical Medicine and Rehabilitation  PMR Pre-admission  Signed  Date of Service:  09/17/2019 3:04 PM      Related encounter: ED to Hosp-Admission (Current) from 09/11/2019 in [facility]      Signed         Show:Clear all [x] Manual[x] Template[x] Copied  Added by: [x] Cleave Ternes, Vertis Kelch, RN[x] [name], MD  [] Hover for details PMR Admission Coordinator Pre-Admission Assessment  Patient: April Le is an 78 y.o., female MRN: [id number] DOB: 1941/07/19 Height: 5' (152.4 cm) Weight: 72.2 kg  Insurance Information HMO:     PPO: yes     PCP:      IPA:      80/20:      OTHER: medicare advantage plan PRIMARY: UHC Medicare      Policy#: [id number]      Subscriber: pt CM Name: asia      Phone#: [phone]     Fax#: [phone] Pre-Cert#: [id number]   Approved for 7 days until 11/9   Employer:  Benefits:  Phone #: [phone]     Name: 11/2 Eff. Date: 11/14/2018     Deduct: none      Out of Pocket Max: $4000      Life Max: none CIR: $160 co pay per day days 1 until 10      SNF: no co pay days 1 until 20; $50 co pay per day days 21 until 100 Outpatient: $20 co pay per visit     Co-Pay: visits per medical neccesity Home Health: 100%      Co-Pay: visits limited by medical neccesity DME: 80%     Co-Pay: 20% Providers: in network  SECONDARY: none       Medicaid Application Date:       Case Manager:  Disability Application Date:       Case Worker:   The "Data Collection Information Summary" for patients in Inpatient Rehabilitation Facilities with attached "Privacy Act [place] Records" was provided and verbally reviewed with: Patient and Family  Emergency Contact Information         Contact Information    Name Relation Home Work Mobile   April Le Spouse [place]   April Le Daughter   [phone]      Current Medical History  Patient Admitting Diagnosis: CVA   History of Present Illness: 78 year old right-handed female with history of diastolic congestive heart failurewith pulmonary fibrosis, prior CVA with mild left-sided weakness and mild aphasia maintained on aspirin 81 mg daily and Plavix, hypertension, hyperlipidemia, CKD stage III.Presented 09/11/2019 nonverbal with right side weakness. SARS Covid negative, creatinine 1.25, hemoglobin 11.8, WBC 10,700. Cranial CT scan showed no hemorrhage or visible acute infarction. Patient did not receive TPA. CT angiogram of head and neck showed left distal M1 embolism extending into proximal M2 branches with poorly enhancing downstream vessels. Remote moderate left lateral frontal and upper insular infarction. CT perfusion showed a 38 cc acute infarct and 56 cc of superimposed penumbra. Underwent cerebral angiogram with complete revascularization of occluded Lt LMCAM1 segment. MRI follow-up showed acute cortical infarct involving majority of left MCA distribution. There was patchy petechial hemorrhage. Echocardiogram with ejection fraction of 65%. Lower extremity Dopplers negative for DVT. Neurology follow-up currently on aspirin 325 mg daily as well as Plavix. Loop recorder has been placed. Patient remains on Lovenox for DVT prophylaxis.Patient is currently n.p.o. with nasogastric tube feeds.Patient has had episodes of sundowning maintained on Aricept as well as Seroquel. Patient with  episode of shortness of breath respiratory 26 oxygen saturation 97% 09/17/2019 there was some copious secretions.chest x-ray completed showing no active disease patient did receive an extra dose of Lasix and troponin was negative.   Complete NIHSS TOTAL: 8  Patient's medical record from [facility] has been reviewed by the rehabilitation admission coordinator and physician.  Past Medical History      Past Medical History:  Diagnosis Date  . Abdominal distention    past two months  . Abnormality of  gait 10/28/2015  . Aphasia due to acute stroke ([place])   . 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 Arthritis    right knee  . Benign essential HTN 11/19/2013  . CAP (community acquired pneumonia) 09/07/2013  . CHF (congestive heart failure) ([place])   . Chronic diastolic heart failure ([place]) 11/19/2013  . Chronic kidney disease    STAGE III KIDNEY DISEASE-PER PT--PT SEES DR. Risa Grill UROLOGIST  . Complication of anesthesia    PT REMEMBERS BREATHING PROBLEMS WAKING UP FROM KNEE SURGERY AT [facility] 2011 OR 2012  . Coronary artery calcification seen on CT scan 01/10/2017  . Fever of unknown origin 09/04/2013  . GERD (gastroesophageal reflux disease)   . Headache   . Headache(784.0)   . 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 Hypercholesteremia   . Hypertension   . Hypertensive heart disease with heart failure ([place]) 01/10/2017  . Hypothyroidism   . Hypoxia 09/04/2013  . Leukocytosis 09/04/2013  . Mass of ovary 10/26/2011  . OSA (obstructive sleep apnea) 11/19/2013  . Pneumonia   . Shortness of breath    WITH EXERTION  . Sinus drainage    PT STARTED ANTIBIOTIC 11/17/11 --IS HOARSE TODAY, SOME WHEEZING AND COUGH WITH YELLOW DRAINAGE  . SIRS (systemic inflammatory response syndrome) ([place]) 09/04/2013  . SOB (shortness of breath) 09/04/2013  . Stroke ([place]) 02/2007, 08/2009   2 total -RESIDUAL WEAKNESS LEFT LEG--AND APHASIA  . Stroke ([place]) 09/01/2017  . Stroke ([place]) 09/08/2017  . Tumor of ovary   . Urinary incontinence   . Urinary incontinence   . Weakness of left leg    Mildly, post stroke    Family History   family history includes Heart attack in her father; Heart failure in her father; Kidney failure in her mother; Stomach cancer in her maternal grandmother and another family member; Stroke in her paternal grandmother.  Prior Rehab/Hospitalizations Has the patient had prior rehab or hospitalizations prior to  admission? Yes  Has the patient had major surgery during 100 days prior to admission? Yes             Current Medications  Current Facility-Administered Medications:  .  acetaminophen (TYLENOL) tablet 650 mg, 650 mg, Oral, Q4H PRN **OR** acetaminophen (TYLENOL) 160 MG/5ML solution 650 mg, 650 mg, Per Tube, Q4H PRN, 650 mg at 09/17/19 0909 **OR** acetaminophen (TYLENOL) suppository 650 mg, 650 mg, Rectal, Q4H PRN, Evans Lance, MD .  aspirin tablet 325 mg, 325 mg, Per Tube, Daily, Evans Lance, MD, 325 mg at 09/17/19 0909 .  atorvastatin (LIPITOR) tablet 20 mg, 20 mg, Per Tube, q1800, Evans Lance, MD, 20 mg at 09/16/19 1724 .  chlorhexidine (PERIDEX) 0.12 % solution 15 mL, 15 mL, Mouth Rinse, BID, Evans Lance, MD, 15 mL at 09/17/19 0909 .  clopidogrel (PLAVIX) tablet 75 mg, 75 mg, Per Tube, Daily, Evans Lance, MD, 75 mg at 09/17/19 0909 .  donepezil (ARICEPT) tablet 5 mg, 5 mg, Per Tube, Daily, Evans Lance, MD, 5 mg at 09/17/19 0909 .  enoxaparin (LOVENOX) injection 40 mg, 40 mg, Subcutaneous, Q24H, Evans Lance, MD, 40 mg at 09/17/19 0955 .  feeding supplement (JEVITY 1.2 CAL) liquid 1,000 mL, 1,000 mL, Per Tube, Continuous, Evans Lance, MD, Last Rate: 55 mL/hr at 09/15/19 0538, 1,000 mL at 09/15/19 0538 .  free water 200 mL, 200 mL, Per Tube, Q6H, Evans Lance, MD, 200 mL at 09/17/19 1141 .  furosemide (LASIX) tablet 40 mg, 40 mg, Per Tube, BID, Rosalin Hawking, MD .  levothyroxine (SYNTHROID) tablet 50 mcg, 50 mcg, Per Tube, Q0600, Evans Lance, MD, 50 mcg at 09/17/19 [phone] .  MEDLINE mouth rinse, 15 mL, Mouth Rinse, q12n4p, Evans Lance, MD, 15 mL at 09/17/19 1141 .  metoprolol tartrate (LOPRESSOR) tablet 50 mg, 50 mg, Per Tube, BID, Evans Lance, MD, 50 mg at 09/17/19 0909 .  ondansetron (ZOFRAN) injection 4 mg, 4 mg, Intravenous, Q6H PRN, Evans Lance, MD .  potassium chloride 20 MEQ/15ML (10%) solution 20 mEq, 20 mEq, Per Tube, Daily, Rosalin Hawking,  MD, 20 mEq at 09/17/19 0955 .  QUEtiapine (SEROQUEL) tablet 25 mg, 25 mg, Per Tube, QHS, Rosalin Hawking, MD .  senna-docusate (Senokot-S) tablet 1 tablet, 1 tablet, Per Tube, QHS PRN, Rosalin Hawking, MD  Patients Current Diet:     Diet Order                  Diet NPO time specified  Diet effective now               Precautions / Restrictions Precautions Precautions: Fall Restrictions Weight Bearing Restrictions: No   Has the patient had 2 or more falls or a fall with injury in the past year? No  Prior Activity Level  Prior Functional Level Self Care: Did the patient need help bathing, dressing, using the toilet or eating? Independent  Indoor Mobility: Did the patient need assistance with walking from room to room (with or without device)? Needed some help  Stairs: Did the patient need assistance with internal or external stairs (with or without device)? Needed some help  Functional Cognition: Did the patient need help planning regular tasks such as shopping or remembering to take medications? Needed some help  Home Assistive Devices / Equipment Home Equipment: Environmental consultant - 2 wheels, [place] - single point, Civil engineer, contracting, Wheelchair - manual  Prior Device Use: Indicate devices/aids used by the patient prior to current illness, exacerbation or injury? Walker  Current Functional Level Cognition  Arousal/Alertness: Awake/alert Overall Cognitive Status: Difficult to assess Difficult to assess due to: Impaired communication Current Attention Level: Sustained Orientation Level: Oriented to person Following Commands: Follows one step commands inconsistently, Follows one step commands with increased time General Comments: pt with aphasia and apraxia; pt following single step commands inconsistently and needs tactile and visual cues for some tasks Attention: Sustained Sustained Attention: Appears intact Memory: Impaired Memory Impairment: Decreased recall of new  information(Place) Awareness: Impaired Awareness Impairment: Intellectual impairment Problem Solving: (UTA) Executive Function: Initiating Initiating: Impaired Initiating Impairment: Verbal basic, Functional basic Safety/Judgment: Impaired    Extremity Assessment (includes Sensation/Coordination)  Upper Extremity Assessment: Generalized weakness, RUE deficits/detail, LUE deficits/detail RUE Deficits / Details: requires hand over hand assistance for gross movement of RUE to comb hair;limited shoulder flexion about 100degrees;grossly 3/5;grip WFL able to maintain grasp on comb and toothbrush RUE Coordination: decreased gross motor LUE  Deficits / Details: grossly 3-/5;requires intemittent assist to place hand on RW;husband reports pt with limited use of LUE functionally from prior stroke  Lower Extremity Assessment: Defer to PT evaluation    ADLs  Overall ADL's : Needs assistance/impaired Eating/Feeding: NPO Eating/Feeding Details (indicate cue type and reason): pt currently with feeding tube- daughter with questions about deating again  Grooming: Oral care, Wash/dry face, Moderate assistance, Standing, Brushing hair Grooming Details (indicate cue type and reason): completed while standing at sink level;gestural cues to use comb and toothbrush;apraxia noted with use of toothbrush to comb hair;demonstrates short rom with combing hair, requires hand over hand assistance for gross movements Upper Body Bathing: Moderate assistance, Sitting Lower Body Bathing: Total assistance Toilet Transfer: Moderate assistance, RW, Regular Toilet, Ambulation Toilet Transfer Details (indicate cue type and reason): simulated to chair at sink and return to bed Toileting- Clothing Manipulation and Hygiene: Total assistance Functional mobility during ADLs: Moderate assistance, Rolling walker General ADL Comments: appeared to demonstrate right side inattention, bumping into items on right side    Mobility   Overal bed mobility: Needs Assistance Bed Mobility: Supine to Sit Supine to sit: Min guard Sit to supine: Min guard General bed mobility comments: Min guard for safety; use of rail    Transfers  Overall transfer level: Needs assistance Equipment used: Rolling walker (2 wheeled) Transfers: Sit to/from Stand Sit to Stand: Min assist, +2 physical assistance General transfer comment: pt stood X 2 from EOB    Ambulation / Gait / Stairs / Wheelchair Mobility  Ambulation/Gait Ambulation/Gait assistance: Mod assist, +2 safety/equipment, Min assist Gait Distance (Feet): 50 Feet Assistive device: Rolling walker (2 wheeled) Gait Pattern/deviations: Step-through pattern, Decreased step length - left, Decreased step length - right, Decreased stride length, Shuffle, Trunk flexed General Gait Details: multimodal cues for increased bilat step lengths, upright posture, and safe use of AD; pt with tendency to keep RW too far away and taking very short steps expecially with R LE; assistance required to manage AD Gait velocity: slow Gait velocity interpretation: <1.31 ft/sec, indicative of household ambulator    Posture / Balance Dynamic Sitting Balance Sitting balance - Comments: no LOB sitting EOB, no support from therapist needed Balance Overall balance assessment: Needs assistance Sitting-balance support: No upper extremity supported, Feet supported Sitting balance-Leahy Scale: Fair Sitting balance - Comments: no LOB sitting EOB, no support from therapist needed Standing balance support: Bilateral upper extremity supported, During functional activity Standing balance-Leahy Scale: Poor Standing balance comment: reliant on external support    Special needs/care consideration BiPAP/CPAP n/a CPM  N/a Continuous Drip IV  N/a Dialysis n/a Life Vest  N/a Oxygen  N/a Special Bed  N/a Trach Size  N/a Wound Vac n/a Skin ecchymosis to bilateral arms and legs Bowel mgmt: incontinent LBM 10/31  Bladder mgmt: external catheter Diabetic mgmt: Hgb A1c 6.7 Behavioral consideration n/a Chemo/radiation n/a 10 FR right nare feeding tube placed 11/2                                                              Designated visitor is Recruitment consultant, spouse Previous Home Environment  Living Arrangements: Spouse/significant other  Lives With: Spouse Available Help at Discharge: Family, Available 24 hours/day, Other (Comment) Type of Home: House Home Layout: Two level, Able to live  on main level with bedroom/bathroom Alternate Level Stairs-Rails: Right, Left Alternate Level Stairs-Number of Steps: flight Home Access: Level entry Bathroom Shower/Tub: Multimedia programmer: Handicapped height Bathroom Accessibility: Yes Home Care Services: No Additional Comments: daughter present at eval  Discharge Living Setting Plans for Discharge Living Setting: Patient's home, Lives with (comment)(spouse) Type of Home at Discharge: House Discharge Home Layout: Two level, Able to live on main level with bedroom/bathroom Alternate Level Stairs-Rails: Right, Left Alternate Level Stairs-Number of Steps: flight Discharge Home Access: Level entry Discharge Bathroom Shower/Tub: Walk-in shower Discharge Bathroom Toilet: Handicapped height Discharge Bathroom Accessibility: Yes How Accessible: Accessible via walker Does the patient have any problems obtaining your medications?: No  Social/Family/Support Systems Patient Roles: Spouse, Parent Contact Information: spouse Fulton Reek and daughter, [name] Anticipated Caregiver: spouse and daughter Anticipated Caregiver's Contact Information: see above Caregiver Availability: 24/7 Discharge Plan Discussed with Primary Caregiver: Yes Is Caregiver In Agreement with Plan?: Yes Does Caregiver/Family have Issues with Lodging/Transportation while Pt is in Rehab?: No  Goals/Additional Needs Patient/Family Goal for Rehab: supervision PT, min OT, min SLP Expected  length of stay: [facility] 10 to 12 days Dietary Needs: Cortrak; NPO with Jevity at 55 cc/hr Pt/Family Agrees to Admission and willing to participate: Yes Program Orientation Provided & Reviewed with Pt/Caregiver Including Roles  & Responsibilities: Yes  Decrease burden of Care through IP rehab admission: n/a  Possible need for SNF placement upon discharge: not anticipated  Patient Condition: I have reviewed medical [facility] , spoken with CM, and patient, spouse and daughter. I met with patient at the bedside for inpatient rehabilitation assessment.  Patient will benefit from ongoing PT, OT and SLP, can actively participate in 3 hours of therapy a day 5 days of the week, and can make measurable gains during the admission.  Patient will also benefit from the coordinated team approach during an Inpatient Acute Rehabilitation admission.  The patient will receive intensive therapy as well as Rehabilitation physician, nursing, social worker, and care management interventions.  Due to bladder management, bowel management, safety, skin/wound care, disease management, medication administration, pain management and patient education the patient requires 24 hour a day rehabilitation nursing.  The patient is currently mod assist with mobility and basic ADLs.  Discharge setting and therapy post discharge at home with home health is anticipated.  Patient has agreed to participate in the Acute Inpatient Rehabilitation Program and will admit today.  Preadmission Screen Completed By:  Cleatrice Burke, 09/17/2019 5:04 PM ______________________________________________________________________   Discussed status with Dr. Dagoberto Ligas on  09/17/2019 at 1704 and received approval for admission today.  Admission Coordinator:  Cleatrice Burke, RN, time 1443 Date  09/16/3029   Assessment/Plan: Diagnosis: 1. Does the need for close, 24 hr/day Medical supervision in concert with the patient's  rehab needs make it unreasonable for this patient to be served in a less intensive setting? Yes 2. Co-Morbidities requiring supervision/potential complications: Aphasia, dysphagia with NPO/Coretrak, CKD Stage III, HTN, HLD, diastolic CHF with EF of 15-40% on top of remote L MCA and R PCA infarcts well as new CVA 3. Due to bladder management, bowel management, safety, skin/wound care, disease management, medication administration and patient education, does the patient require 24 hr/day rehab nursing? Yes 4. Does the patient require coordinated care of a physician, rehab nurse, PT, OT, and SLP to address physical and functional deficits in the context of the above medical diagnosis(es)? Yes Addressing deficits in the following areas: balance, endurance, locomotion, strength, transferring,  bowel/bladder control, bathing, dressing, feeding, grooming, toileting, cognition, speech, language, swallowing and psychosocial support 5. Can the patient actively participate in an intensive therapy program of at least 3 hrs of therapy 5 days a week? Yes 6. The potential for patient to make measurable gains while on inpatient rehab is fair 7. Anticipated functional outcomes upon discharge from inpatient rehab: min assist PT, min assist OT, min assist SLP 8. Estimated rehab length of stay to reach the above functional goals is: 10-12 days 9. Anticipated discharge destination: Home 10. Overall Rehab/Functional Prognosis: fair   MD Signature:         Revision History

## 2019-09-17 NOTE — PMR Pre-admission (Signed)
PMR Admission Coordinator Pre-Admission Assessment  Patient: April Le is an 78 y.o., female MRN: [id number] DOB: Dec 07, 1940 Height: 5' (152.4 cm) Weight: 72.2 kg  Insurance Information HMO:     PPO: yes     PCP:      IPA:      80/20:      OTHER: medicare advantage plan PRIMARY: UHC Medicare      Policy#: [id number]      Subscriber: pt CM Name: asia      Phone#: [phone]     Fax#: [phone] Pre-Cert#: [id number]   Approved for 7 days until 11/9   Employer:  Benefits:  Phone #: [phone]     Name: 11/2 Eff. Date: 11/14/2018     Deduct: none      Out of Pocket Max: $4000      Life Max: none CIR: $160 co pay per day days 1 until 10      SNF: no co pay days 1 until 20; $50 co pay per day days 21 until 100 Outpatient: $20 co pay per visit     Co-Pay: visits per medical neccesity Home Health: 100%      Co-Pay: visits limited by medical neccesity DME: 80%     Co-Pay: 20% Providers: in network  SECONDARY: none       Medicaid Application Date:       Case Manager:  Disability Application Date:       Case Worker:   The "Data Collection Information Summary" for patients in Inpatient Rehabilitation Facilities with attached "Privacy Act [place] Records" was provided and verbally reviewed with: Patient and Family  Emergency Contact Information Contact Information    Name Relation Home Work Mobile   [place] Spouse [place]   [name] Daughter   [phone]      Current Medical History  Patient Admitting Diagnosis: CVA  History of Present Illness: 78 year old right-handed female with history of diastolic congestive heart failure with pulmonary fibrosis, prior CVA with mild left-sided weakness and mild aphasia maintained on aspirin 81 mg daily and Plavix, hypertension, hyperlipidemia, CKD stage III. Presented 09/11/2019 nonverbal with right side weakness.  SARS Covid negative, creatinine 1.25, hemoglobin 11.8, WBC 10,700.  Cranial CT scan  showed no hemorrhage or visible acute infarction.  Patient did not receive TPA.  CT angiogram of head and neck showed left distal M1 embolism extending into proximal M2 branches with poorly enhancing downstream vessels.  Remote moderate left lateral frontal and upper insular infarction.  CT perfusion showed a 38 cc acute infarct and 56 cc of superimposed penumbra.  Underwent cerebral angiogram with complete revascularization of occluded Lt LMCA M1 segment.  MRI follow-up showed acute cortical infarct involving majority of left MCA distribution.  There was patchy petechial hemorrhage.  Echocardiogram with ejection fraction of 65%.  Lower extremity Dopplers negative for DVT.  Neurology follow-up currently on aspirin 325 mg daily as well as Plavix.  Loop recorder has been placed.  Patient remains on Lovenox for DVT prophylaxis.  Patient is currently n.p.o. with nasogastric tube feeds.  Patient has had episodes of sundowning maintained on Aricept as well as Seroquel.  Patient with episode of shortness of breath respiratory 26 oxygen saturation 97% 09/17/2019 there was some copious secretions.chest x-ray completed showing no active disease patient did receive an extra dose of Lasix and troponin was negative.    Complete NIHSS TOTAL: 8  Patient's medical record from [facility] has been reviewed by the rehabilitation admission coordinator and  physician.  Past Medical History  Past Medical History:  Diagnosis Date  . Abdominal distention    past two months  . Abnormality of gait 10/28/2015  . Aphasia due to acute stroke ([place])   . 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 Arthritis    right knee  . Benign essential HTN 11/19/2013  . CAP (community acquired pneumonia) 09/07/2013  . CHF (congestive heart failure) ([place])   . Chronic diastolic heart failure ([place]) 11/19/2013  . Chronic kidney disease    STAGE III KIDNEY DISEASE-PER PT--PT SEES DR. Risa Grill UROLOGIST  . Complication of anesthesia    PT REMEMBERS BREATHING  PROBLEMS WAKING UP FROM KNEE SURGERY AT [facility] 2011 OR 2012  . Coronary artery calcification seen on CT scan 01/10/2017  . Fever of unknown origin 09/04/2013  . GERD (gastroesophageal reflux disease)   . Headache   . Headache(784.0)   . 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 Hypercholesteremia   . Hypertension   . Hypertensive heart disease with heart failure ([place]) 01/10/2017  . Hypothyroidism   . Hypoxia 09/04/2013  . Leukocytosis 09/04/2013  . Mass of ovary 10/26/2011  . OSA (obstructive sleep apnea) 11/19/2013  . Pneumonia   . Shortness of breath    WITH EXERTION  . Sinus drainage    PT STARTED ANTIBIOTIC 11/17/11 --IS HOARSE TODAY, SOME WHEEZING AND COUGH WITH YELLOW DRAINAGE  . SIRS (systemic inflammatory response syndrome) ([place]) 09/04/2013  . SOB (shortness of breath) 09/04/2013  . Stroke ([place]) 02/2007, 08/2009   2 total -RESIDUAL WEAKNESS LEFT LEG--AND APHASIA  . Stroke ([place]) 09/01/2017  . Stroke ([place]) 09/08/2017  . Tumor of ovary   . Urinary incontinence   . Urinary incontinence   . Weakness of left leg    Mildly, post stroke    Family History   family history includes Heart attack in her father; Heart failure in her father; Kidney failure in her mother; Stomach cancer in her maternal grandmother and another family member; Stroke in her paternal grandmother.  Prior Rehab/Hospitalizations Has the patient had prior rehab or hospitalizations prior to admission? Yes  Has the patient had major surgery during 100 days prior to admission? Yes   Current Medications  Current Facility-Administered Medications:  .  acetaminophen (TYLENOL) tablet 650 mg, 650 mg, Oral, Q4H PRN **OR** acetaminophen (TYLENOL) 160 MG/5ML solution 650 mg, 650 mg, Per Tube, Q4H PRN, 650 mg at 09/17/19 0909 **OR** acetaminophen (TYLENOL) suppository 650 mg, 650 mg, Rectal, Q4H PRN, Evans Lance, MD .  aspirin tablet 325 mg, 325 mg,  Per Tube, Daily, Evans Lance, MD, 325 mg at 09/17/19 0909 .  atorvastatin (LIPITOR) tablet 20 mg, 20 mg, Per Tube, q1800, Evans Lance, MD, 20 mg at 09/16/19 1724 .  chlorhexidine (PERIDEX) 0.12 % solution 15 mL, 15 mL, Mouth Rinse, BID, Evans Lance, MD, 15 mL at 09/17/19 0909 .  clopidogrel (PLAVIX) tablet 75 mg, 75 mg, Per Tube, Daily, Evans Lance, MD, 75 mg at 09/17/19 0909 .  donepezil (ARICEPT) tablet 5 mg, 5 mg, Per Tube, Daily, Evans Lance, MD, 5 mg at 09/17/19 0909 .  enoxaparin (LOVENOX) injection 40 mg, 40 mg, Subcutaneous, Q24H, Evans Lance, MD, 40 mg at 09/17/19 0955 .  feeding supplement (JEVITY 1.2 CAL) liquid 1,000 mL, 1,000 mL, Per Tube, Continuous, Evans Lance, MD, Last Rate: 55 mL/hr at 09/15/19 0538, 1,000 mL at 09/15/19 0538 .  free water 200 mL, 200 mL, Per Tube, Q6H, Evans Lance, MD, 200 mL at 09/17/19 1141 .  furosemide (LASIX) tablet 40 mg, 40 mg, Per Tube, BID, Rosalin Hawking, MD .  levothyroxine (SYNTHROID) tablet 50 mcg, 50 mcg, Per Tube, Q0600, Evans Lance, MD, 50 mcg at 09/17/19 [phone] .  MEDLINE mouth rinse, 15 mL, Mouth Rinse, q12n4p, Evans Lance, MD, 15 mL at 09/17/19 1141 .  metoprolol tartrate (LOPRESSOR) tablet 50 mg, 50 mg, Per Tube, BID, Evans Lance, MD, 50 mg at 09/17/19 0909 .  ondansetron (ZOFRAN) injection 4 mg, 4 mg, Intravenous, Q6H PRN, Evans Lance, MD .  potassium chloride 20 MEQ/15ML (10%) solution 20 mEq, 20 mEq, Per Tube, Daily, Rosalin Hawking, MD, 20 mEq at 09/17/19 0955 .  QUEtiapine (SEROQUEL) tablet 25 mg, 25 mg, Per Tube, QHS, Rosalin Hawking, MD .  senna-docusate (Senokot-S) tablet 1 tablet, 1 tablet, Per Tube, QHS PRN, Rosalin Hawking, MD  Patients Current Diet:  Diet Order            Diet NPO time specified  Diet effective now              Precautions / Restrictions Precautions Precautions: Fall Restrictions Weight Bearing Restrictions: No   Has the patient had 2 or more falls or a fall with injury  in the past year? No  Prior Activity Level    Prior Functional Level Self Care: Did the patient need help bathing, dressing, using the toilet or eating? Independent  Indoor Mobility: Did the patient need assistance with walking from room to room (with or without device)? Needed some help  Stairs: Did the patient need assistance with internal or external stairs (with or without device)? Needed some help  Functional Cognition: Did the patient need help planning regular tasks such as shopping or remembering to take medications? Needed some help  Home Assistive Devices / Equipment Home Equipment: Environmental consultant - 2 wheels, [place] - single point, Civil engineer, contracting, Wheelchair - manual  Prior Device Use: Indicate devices/aids used by the patient prior to current illness, exacerbation or injury? Walker  Current Functional Level Cognition  Arousal/Alertness: Awake/alert Overall Cognitive Status: Difficult to assess Difficult to assess due to: Impaired communication Current Attention Level: Sustained Orientation Level: Oriented to person Following Commands: Follows one step commands inconsistently, Follows one step commands with increased time General Comments: pt with aphasia and apraxia; pt following single step commands inconsistently and needs tactile and visual cues for some tasks Attention: Sustained Sustained Attention: Appears intact Memory: Impaired Memory Impairment: Decreased recall of new information(Place) Awareness: Impaired Awareness Impairment: Intellectual impairment Problem Solving: (UTA) Executive Function: Initiating Initiating: Impaired Initiating Impairment: Verbal basic, Functional basic Safety/Judgment: Impaired    Extremity Assessment (includes Sensation/Coordination)  Upper Extremity Assessment: Generalized weakness, RUE deficits/detail, LUE deficits/detail RUE Deficits / Details: requires hand over hand assistance for gross movement of RUE to comb hair;limited shoulder  flexion about 100degrees;grossly 3/5;grip WFL able to maintain grasp on comb and toothbrush RUE Coordination: decreased gross motor LUE Deficits / Details: grossly 3-/5;requires intemittent assist to place hand on RW;husband reports pt with limited use of LUE functionally from prior stroke  Lower Extremity Assessment: Defer to PT evaluation    ADLs  Overall ADL's : Needs assistance/impaired Eating/Feeding: NPO Eating/Feeding Details (indicate cue type and reason): pt currently with feeding tube- daughter with questions about deating again  Grooming: Oral care, Wash/dry face, Moderate assistance, Standing, Brushing hair Grooming Details (indicate cue type and reason): completed  while standing at sink level;gestural cues to use comb and toothbrush;apraxia noted with use of toothbrush to comb hair;demonstrates short rom with combing hair, requires hand over hand assistance for gross movements Upper Body Bathing: Moderate assistance, Sitting Lower Body Bathing: Total assistance Toilet Transfer: Moderate assistance, RW, Regular Toilet, Ambulation Toilet Transfer Details (indicate cue type and reason): simulated to chair at sink and return to bed Toileting- Clothing Manipulation and Hygiene: Total assistance Functional mobility during ADLs: Moderate assistance, Rolling walker General ADL Comments: appeared to demonstrate right side inattention, bumping into items on right side    Mobility  Overal bed mobility: Needs Assistance Bed Mobility: Supine to Sit Supine to sit: Min guard Sit to supine: Min guard General bed mobility comments: Min guard for safety; use of rail    Transfers  Overall transfer level: Needs assistance Equipment used: Rolling walker (2 wheeled) Transfers: Sit to/from Stand Sit to Stand: Min assist, +2 physical assistance General transfer comment: pt stood X 2 from EOB    Ambulation / Gait / Stairs / Wheelchair Mobility  Ambulation/Gait Ambulation/Gait assistance: Mod  assist, +2 safety/equipment, Min assist Gait Distance (Feet): 50 Feet Assistive device: Rolling walker (2 wheeled) Gait Pattern/deviations: Step-through pattern, Decreased step length - left, Decreased step length - right, Decreased stride length, Shuffle, Trunk flexed General Gait Details: multimodal cues for increased bilat step lengths, upright posture, and safe use of AD; pt with tendency to keep RW too far away and taking very short steps expecially with R LE; assistance required to manage AD Gait velocity: slow Gait velocity interpretation: <1.31 ft/sec, indicative of household ambulator    Posture / Balance Dynamic Sitting Balance Sitting balance - Comments: no LOB sitting EOB, no support from therapist needed Balance Overall balance assessment: Needs assistance Sitting-balance support: No upper extremity supported, Feet supported Sitting balance-Leahy Scale: Fair Sitting balance - Comments: no LOB sitting EOB, no support from therapist needed Standing balance support: Bilateral upper extremity supported, During functional activity Standing balance-Leahy Scale: Poor Standing balance comment: reliant on external support    Special needs/care consideration BiPAP/CPAP n/a CPM  N/a Continuous Drip IV  N/a Dialysis n/a Life Vest  N/a Oxygen  N/a Special Bed  N/a Trach Size  N/a Wound Vac n/a Skin ecchymosis to bilateral arms and legs Bowel mgmt: incontinent LBM 10/31 Bladder mgmt: external catheter Diabetic mgmt: Hgb A1c 6.7 Behavioral consideration n/a Chemo/radiation n/a 10 FR right nare feeding tube placed 11/2       Designated visitor is Recruitment consultant, spouse Previous Environmental health practitioner  Living Arrangements: Spouse/significant other  Lives With: Spouse Available Help at Discharge: Family, Available 24 hours/day, Other (Comment) Type of Home: House Home Layout: Two level, Able to live on main level with bedroom/bathroom Alternate Level Stairs-Rails: Right, Left Alternate Level  Stairs-Number of Steps: flight Home Access: Level entry Bathroom Shower/Tub: Multimedia programmer: Handicapped height Bathroom Accessibility: Yes Home Care Services: No Additional Comments: daughter present at eval  Discharge Living Setting Plans for Discharge Living Setting: Patient's home, Lives with (comment)(spouse) Type of Home at Discharge: House Discharge Home Layout: Two level, Able to live on main level with bedroom/bathroom Alternate Level Stairs-Rails: Right, Left Alternate Level Stairs-Number of Steps: flight Discharge Home Access: Level entry Discharge Bathroom Shower/Tub: Walk-in shower Discharge Bathroom Toilet: Handicapped height Discharge Bathroom Accessibility: Yes How Accessible: Accessible via walker Does the patient have any problems obtaining your medications?: No  Social/Family/Support Systems Patient Roles: Spouse, Parent Contact Information: spouse F[place]on Reek and daughter, [name] Anticipated Caregiver:  spouse and daughter Anticipated Caregiver's Contact Information: see above Caregiver Availability: 24/7 Discharge Plan Discussed with Primary Caregiver: Yes Is Caregiver In Agreement with Plan?: Yes Does Caregiver/Family have Issues with Lodging/Transportation while Pt is in Rehab?: No  Goals/Additional Needs Patient/Family Goal for Rehab: supervision PT, min OT, min SLP Expected length of stay: ELOS 10 to 12 days Dietary Needs: Cortrak; NPO with Jevity at 55 cc/hr Pt/Family Agrees to Admission and willing to participate: Yes Program Orientation Provided & Reviewed with Pt/Caregiver Including Roles  & Responsibilities: Yes  Decrease burden of Care through IP rehab admission: n/a  Possible need for SNF placement upon discharge: not anticipated  Patient Condition: I have reviewed medical records from [facility] , spoken with CM, and patient, spouse and daughter. I met with patient at the bedside for inpatient rehabilitation assessment.   Patient will benefit from ongoing PT, OT and SLP, can actively participate in 3 hours of therapy a day 5 days of the week, and can make measurable gains during the admission.  Patient will also benefit from the coordinated team approach during an Inpatient Acute Rehabilitation admission.  The patient will receive intensive therapy as well as Rehabilitation physician, nursing, social worker, and care management interventions.  Due to bladder management, bowel management, safety, skin/wound care, disease management, medication administration, pain management and patient education the patient requires 24 hour a day rehabilitation nursing.  The patient is currently mod assist with mobility and basic ADLs.  Discharge setting and therapy post discharge at home with home health is anticipated.  Patient has agreed to participate in the Acute Inpatient Rehabilitation Program and will admit today.  Preadmission Screen Completed By:  Cleatrice Burke, 09/17/2019 5:04 PM ______________________________________________________________________   Discussed status with Dr. Dagoberto Ligas on  09/17/2019 at 1704 and received approval for admission today.  Admission Coordinator:  Cleatrice Burke, RN, time 7903 Date  09/16/3029   Assessment/Plan: Diagnosis: 1. Does the need for close, 24 hr/day Medical supervision in concert with the patient's rehab needs make it unreasonable for this patient to be served in a less intensive setting? Yes 2. Co-Morbidities requiring supervision/potential complications: Aphasia, dysphagia with NPO/Coretrak, CKD Stage III, HTN, HLD, diastolic CHF with EF of 83-33% on top of remote L MCA and R PCA infarcts well as new CVA 3. Due to bladder management, bowel management, safety, skin/wound care, disease management, medication administration and patient education, does the patient require 24 hr/day rehab nursing? Yes 4. Does the patient require coordinated care of a physician, rehab nurse, PT,  OT, and SLP to address physical and functional deficits in the context of the above medical diagnosis(es)? Yes Addressing deficits in the following areas: balance, endurance, locomotion, strength, transferring, bowel/bladder control, bathing, dressing, feeding, grooming, toileting, cognition, speech, language, swallowing and psychosocial support 5. Can the patient actively participate in an intensive therapy program of at least 3 hrs of therapy 5 days a week? Yes 6. The potential for patient to make measurable gains while on inpatient rehab is fair 7. Anticipated functional outcomes upon discharge from inpatient rehab: min assist PT, min assist OT, min assist SLP 8. Estimated rehab length of stay to reach the above functional goals is: 10-12 days 9. Anticipated discharge destination: Home 10. Overall Rehab/Functional Prognosis: fair   MD Signature:

## 2019-09-18 ENCOUNTER — Inpatient Hospital Stay (HOSPITAL_COMMUNITY): Payer: Medicare Other | Admitting: Occupational Therapy

## 2019-09-18 ENCOUNTER — Inpatient Hospital Stay (HOSPITAL_COMMUNITY): Payer: Medicare Other

## 2019-09-18 ENCOUNTER — Inpatient Hospital Stay (HOSPITAL_COMMUNITY): Payer: Medicare Other | Admitting: Physical Therapy

## 2019-09-18 DIAGNOSIS — I63512 Cerebral infarction due to unspecified occlusion or stenosis of left middle cerebral artery: Secondary | ICD-10-CM

## 2019-09-18 DIAGNOSIS — I5031 Acute diastolic (congestive) heart failure: Secondary | ICD-10-CM

## 2019-09-18 DIAGNOSIS — R1312 Dysphagia, oropharyngeal phase: Secondary | ICD-10-CM

## 2019-09-18 DIAGNOSIS — R4701 Aphasia: Secondary | ICD-10-CM

## 2019-09-18 DIAGNOSIS — Z8673 Personal history of transient ischemic attack (TIA), and cerebral infarction without residual deficits: Secondary | ICD-10-CM

## 2019-09-18 LAB — CBC WITH DIFFERENTIAL/PLATELET
Abs Immature Granulocytes: 0.05 10*3/uL (ref 0.00–0.07)
Basophils Absolute: 0.1 10*3/uL (ref 0.0–0.1)
Basophils Relative: 1 %
Eosinophils Absolute: 0.4 10*3/uL (ref 0.0–0.5)
Eosinophils Relative: 6 %
HCT: 33.6 % — ABNORMAL LOW (ref 36.0–46.0)
Hemoglobin: 11.2 g/dL — ABNORMAL LOW (ref 12.0–15.0)
Immature Granulocytes: 1 %
Lymphocytes Relative: 11 %
Lymphs Abs: 0.8 10*3/uL (ref 0.7–4.0)
MCH: 30.8 pg (ref 26.0–34.0)
MCHC: 33.3 g/dL (ref 30.0–36.0)
MCV: 92.3 fL (ref 80.0–100.0)
Monocytes Absolute: 1 10*3/uL (ref 0.1–1.0)
Monocytes Relative: 13 %
Neutro Abs: 5.2 10*3/uL (ref 1.7–7.7)
Neutrophils Relative %: 68 %
Platelets: 231 10*3/uL (ref 150–400)
RBC: 3.64 MIL/uL — ABNORMAL LOW (ref 3.87–5.11)
RDW: 12.8 % (ref 11.5–15.5)
WBC: 7.5 10*3/uL (ref 4.0–10.5)
nRBC: 0 % (ref 0.0–0.2)

## 2019-09-18 LAB — COMPREHENSIVE METABOLIC PANEL
ALT: 14 U/L (ref 0–44)
AST: 26 U/L (ref 15–41)
Albumin: 2.9 g/dL — ABNORMAL LOW (ref 3.5–5.0)
Alkaline Phosphatase: 73 U/L (ref 38–126)
Anion gap: 14 (ref 5–15)
BUN: 22 mg/dL (ref 8–23)
CO2: 24 mmol/L (ref 22–32)
Calcium: 8.8 mg/dL — ABNORMAL LOW (ref 8.9–10.3)
Chloride: 104 mmol/L (ref 98–111)
Creatinine, Ser: 0.93 mg/dL (ref 0.44–1.00)
GFR calc Af Amer: >60 mL/min (ref >60)
GFR calc non Af Amer: 58 mL/min — ABNORMAL LOW (ref >60)
Glucose, Bld: 151 mg/dL — ABNORMAL HIGH (ref 70–99)
Potassium: 4 mmol/L (ref 3.5–5.1)
Sodium: 142 mmol/L (ref 135–145)
Total Bilirubin: 0.7 mg/dL (ref 0.3–1.2)
Total Protein: 6 g/dL — ABNORMAL LOW (ref 6.5–8.1)

## 2019-09-18 MED ORDER — LORAZEPAM 2 MG/ML IJ SOLN
INTRAMUSCULAR | Status: AC
  Administered 2019-09-18: 2 mg via INTRAVENOUS
  Filled 2019-09-18: qty 1

## 2019-09-18 MED ORDER — LORAZEPAM 2 MG/ML IJ SOLN
2.0000 mg | INTRAMUSCULAR | Status: AC
  Administered 2019-09-18: 15:00:00 2 mg via INTRAVENOUS

## 2019-09-18 MED ORDER — LORAZEPAM 2 MG/ML IJ SOLN
INTRAMUSCULAR | Status: AC
  Administered 2019-09-18: 2 mg via INTRAVENOUS
  Filled 2019-09-18: qty 2

## 2019-09-18 MED ORDER — SODIUM CHLORIDE 0.9 % IV SOLN
2000.0000 mg | Freq: Once | INTRAVENOUS | Status: AC
  Administered 2019-09-18: 2000 mg via INTRAVENOUS
  Filled 2019-09-18: qty 20

## 2019-09-18 MED ORDER — JEVITY 1.2 CAL PO LIQD
1000.0000 mL | ORAL | Status: DC
  Administered 2019-09-19 – 2019-09-24 (×7): 1000 mL
  Filled 2019-09-18 (×13): qty 1000

## 2019-09-18 MED ORDER — LEVETIRACETAM IN NACL 500 MG/100ML IV SOLN
500.0000 mg | Freq: Two times a day (BID) | INTRAVENOUS | Status: DC
  Administered 2019-09-18 – 2019-09-22 (×9): 500 mg via INTRAVENOUS
  Filled 2019-09-18 (×10): qty 100

## 2019-09-18 MED ORDER — LORAZEPAM 2 MG/ML IJ SOLN
2.0000 mg | Freq: Once | INTRAMUSCULAR | Status: DC
  Administered 2019-09-18: 15:00:00 2 mg via INTRAVENOUS

## 2019-09-18 NOTE — Progress Notes (Signed)
RN alerted to duress call from gym and found patient unresponsive with shallow breathing pattern when transferred to bed. PT reported that patient was partipating in therapy when patient began to experience seizure like activity. Rapid Response was called to bedside along with PA. Pt had O2 saturations of 99 and HR of 67. BP 172/82. Code Blue was called for questions of airway protection. Pt became responsive after several minutes and opened eyes and was able to follow simple commands. Code Blue was cancelled. Neurology at bedside ordered Ativan 2 mg as patient still exhibiting seizure like activity. IV nurse at bedside to place midline and Stat CT ordered. First dose of ativan was given at 1514. A second dose of 2mg  was given after the first one was unsuccessful at 1528. Pt's vitals remain stable with patient on non- rebreather mask. IV keppra was ordered and pharmacy was notified. After midline was placed, Rapid Response RN transported patient to CT. RN will await results of EEG and CT. PA aware and remained at bedside. Husband is at bedside and waited outside room. Husband was updated on current situation and plans for tests and is resting in resting area until patient returns.

## 2019-09-18 NOTE — Patient Care Conference (Signed)
Inpatient RehabilitationTeam Conference and Plan of Care Update Date: 09/18/2019   Time: 10:15 AM   Patient Name: April Le      Medical Record Number: [id number]  Date of Birth: 1941/06/30 Sex: Female         Room/Bed: 4W06C/4W06C-01 Payor Info: Payor: Theme park manager MEDICARE / Plan: [facility] MEDICARE / Product Type: *No Product type* /    Admit Date/Time:  09/17/2019  6:04 PM  Primary Diagnosis:  <principal problem not specified>  Patient Active Problem List   Diagnosis Date Noted  . 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 Hx of completed stroke 09/16/2019  . Dysphagia following cerebrovascular accident (CVA) 09/16/2019  . Malnutrition ([place]) 09/16/2019  . Acute blood loss anemia 09/16/2019  . Thrombocytopenia ([place]) 09/16/2019  . Acute ischemic left MCA stroke (HCC) s/p IR, embolic source unknown [id number]  . 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 Bilateral lower extremity edema 07/18/2019  . Weakness of left leg   . Urinary incontinence   . Tumor of ovary   . Stroke ([place])   . Sinus drainage   . Shortness of breath   . Pneumonia   . Hypothyroidism   . Hypertension   . Hypercholesteremia   . Headache   . GERD (gastroesophageal reflux disease)   . Complication of anesthesia   . CKD (chronic kidney disease), stage III   . Arthritis   . 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 Abdominal distention   . Coronary artery calcification seen on CT scan 01/10/2017  . Hypertensive heart disease with heart failure ([place]) 01/10/2017  . Abnormality of gait 10/28/2015  . 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 Chronic diastolic heart failure ([place]) 11/19/2013  . Benign essential HTN 11/19/2013  . OSA (obstructive sleep apnea) 11/19/2013  . CAP (community acquired pneumonia) 09/07/2013  . Leukocytosis 09/04/2013  . Fever of unknown origin 09/04/2013  . Hypoxia 09/04/2013  . SOB (shortness of breath)  09/04/2013  . SIRS (systemic inflammatory response syndrome) ([place]) 09/04/2013  . Mass of ovary 10/26/2011    Expected Discharge Date: Expected Discharge Date: (TBD)  Team Members Present: Physician leading conference: Dr. Alysia Penna Social Worker Present: Ovidio Kin, LCSW Nurse Present: Genene Churn, RN Case Manager: Karene Fry, RN PT Present: Barrie Folk, PT;Rosita Dechalus, PTA OT Present: Darleen Crocker, OT SLP Present: Charolett Bumpers, SLP PPS Coordinator present : Gunnar Fusi, SLP     Current Status/Progress Goal Weekly Team Focus  Bowel/Bladder   pt incontinent of B&B, LBM 11/4 albeit small, previously charted LBM is 11/1  time toielting, regain continence  assess toielting q shift and prn   Swallow/Nutrition/ Hydration   Eval Pending  Eval Pending      ADL's   eval pending  eval pending      Mobility   Eval Pending  Eval pending      Communication   Eval Pending  Eval Pending      Safety/Cognition/ Behavioral Observations  Eval Pending  Eval Pending      Pain   pt nodded she was in pain, has prns  pain less than 5  assess pain q shift and prn   Skin   generalized bruising on BLE/BUE, pt has dressing on chest with old drainage from loop recorder placement  prevent further skin breakdown  assess skin q shift and prn      *See Care Plan and progress notes for long  and short-term goals.     Barriers to Discharge  Current Status/Progress Possible Resolutions Date Resolved   Nursing                  PT                    OT                  SLP                SW                Discharge Planning/Teaching Needs:  Home with husband who was assisting prior to admission due to past history of CVA's. New eval today      Team Discussion: L MCA infarct with exp aphasia, mild R side weakness, swallow/speech affected, permissive HTN cont for several days, problems with sundowning, mittens on secondary to cortrak.  Inc B/B, pulled at tubes, mittens  on, gave tylenol.  OT min/mod transfers, amb short distance, needs timed toileting, given soft call bell, apraxic, followed commands, bathing hand over hand, O2 2L off during therapy, O2 sats 90-93%, back on O2 1/2 L.  PT and SLP evals pending.  Patient is morbidly obese.   Revisions to Treatment Plan: N/A     Medical Summary Current Status: incont of urine, NPO, Feeding tube, min mod A transfers Weekly Focus/Goal: swallow assessment  Barriers to Discharge: Incontinence;Nutrition means   Possible Resolutions to Barriers: timed toileting needed, initial evals in progress   Continued Need for Acute Rehabilitation Level of Care: The patient requires daily medical management by a physician with specialized training in physical medicine and rehabilitation for the following reasons: Direction of a multidisciplinary physical rehabilitation program to maximize functional independence : Yes Medical management of patient stability for increased activity during participation in an intensive rehabilitation regime.: Yes Analysis of laboratory values and/or radiology reports with any subsequent need for medication adjustment and/or medical intervention. : Yes   I attest that I was present, lead the team conference, and concur with the assessment and plan of the team.   Retta Diones 09/18/2019, 2:49 PM  Team conference was held via web/ teleconference due to [place] - 19

## 2019-09-18 NOTE — Code Documentation (Signed)
   Code blue called and was subsequently changed to code stroke upon arrival.  Dr. Erlinda Hong (Stroke team) at the bedside. Signed out and stroke team took over.   Lyndee Hensen, DO PGY-1, [facility] 09/18/2019 3:11 PM

## 2019-09-18 NOTE — Progress Notes (Signed)
Social Work Assessment and Plan   Patient Details  Name: April Le MRN: [id number] Date of Birth: 03-Jan-1941  Today's Date: 09/18/2019  Problem List:  Patient Active Problem List   Diagnosis Date Noted  . 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 Hx of completed stroke 09/16/2019  . Dysphagia following cerebrovascular accident (CVA) 09/16/2019  . Malnutrition ([place]) 09/16/2019  . Acute blood loss anemia 09/16/2019  . Thrombocytopenia ([place]) 09/16/2019  . Acute ischemic left MCA stroke (HCC) s/p IR, embolic source unknown [id number]  . 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 Bilateral lower extremity edema 07/18/2019  . Weakness of left leg   . Urinary incontinence   . Tumor of ovary   . Stroke ([place])   . Sinus drainage   . Shortness of breath   . Pneumonia   . Hypothyroidism   . Hypertension   . Hypercholesteremia   . Headache   . GERD (gastroesophageal reflux disease)   . Complication of anesthesia   . CKD (chronic kidney disease), stage III   . Arthritis   . 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 Abdominal distention   . Coronary artery calcification seen on CT scan 01/10/2017  . Hypertensive heart disease with heart failure ([place]) 01/10/2017  . Abnormality of gait 10/28/2015  . 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 Chronic diastolic heart failure ([place]) 11/19/2013  . Benign essential HTN 11/19/2013  . OSA (obstructive sleep apnea) 11/19/2013  . CAP (community acquired pneumonia) 09/07/2013  . Leukocytosis 09/04/2013  . Fever of unknown origin 09/04/2013  . Hypoxia 09/04/2013  . SOB (shortness of breath) 09/04/2013  . SIRS (systemic inflammatory response syndrome) ([place]) 09/04/2013  . Mass of ovary 10/26/2011   Past Medical History:  Past Medical History:  Diagnosis Date  . Abdominal distention    past two months  . Abnormality of gait 10/28/2015  . 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 Arthritis    right knee  . Benign essential HTN 11/19/2013  . CAP (community acquired pneumonia) 09/07/2013  . CHF (congestive heart failure) ([place])   . Chronic diastolic heart failure ([place]) 11/19/2013  . Chronic kidney disease    STAGE III KIDNEY DISEASE-PER PT--PT SEES DR. Risa Grill UROLOGIST  . Complication of anesthesia    PT REMEMBERS BREATHING PROBLEMS WAKING UP FROM KNEE SURGERY AT [facility] 2011 OR 2012  . Coronary artery calcification seen on CT scan 01/10/2017  . Fever of unknown origin 09/04/2013  . GERD (gastroesophageal reflux disease)   . Headache   . Headache(784.0)   . 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 Hypercholesteremia   . Hypertension   . Hypertensive heart disease with heart failure ([place]) 01/10/2017  . Hypothyroidism   . Hypoxia 09/04/2013  . Leukocytosis 09/04/2013  . Mass of ovary 10/26/2011  . OSA (obstructive sleep apnea) 11/19/2013  . Pneumonia   . Shortness of breath    WITH EXERTION  . Sinus drainage    PT STARTED ANTIBIOTIC 11/17/11 --IS HOARSE TODAY, SOME WHEEZING AND COUGH WITH YELLOW DRAINAGE  . SIRS (systemic inflammatory response syndrome) ([place]) 09/04/2013  . SOB (shortness of breath) 09/04/2013  . Stroke ([place]) 02/2007, 08/2009   2 total -RESIDUAL WEAKNESS LEFT LEG--AND APHASIA  . Stroke ([place]) 09/01/2017  . Stroke ([place]) 09/08/2017  . Tumor of ovary   .  Urinary incontinence   . Urinary incontinence   . Weakness of left leg    Mildly, post stroke   Past Surgical History:  Past Surgical History:  Procedure Laterality Date  . ABDOMINAL HYSTERECTOMY    . CHOLECYSTECTOMY    . IR CT HEAD LTD  09/11/2019  . IR PERCUTANEOUS ART THROMBECTOMY/INFUSION INTRACRANIAL INC DIAG ANGIO  09/11/2019  . LAPAROTOMY  11/22/2011   Procedure: EXPLORATORY LAPAROTOMY;  Surgeon: Imagene Gurney A. Alycia Rossetti, MD;  Location: [facility];  Service: Gynecology;  Laterality: N/A;  . LOOP RECORDER  INSERTION N/A 09/16/2019   Procedure: LOOP RECORDER INSERTION;  Surgeon: Evans Lance, MD;  Location: [facility];  Service: Cardiovascular;  Laterality: N/A;  . MENISCUS REPAIR    . RADIOLOGY WITH ANESTHESIA N/A 09/11/2019   Procedure: IR WITH ANESTHESIA;  Surgeon: Radiologist, Medication, MD;  Location: [facility];  Service: Radiology;  Laterality: N/A;  . SALPINGOOPHORECTOMY  11/22/2011   Procedure: SALPINGO OOPHERECTOMY;  Surgeon: Imagene Gurney A. Alycia Rossetti, MD;  Location: [facility];  Service: Gynecology;  Laterality: Bilateral;  . TOE SURGERY    . TONSILLECTOMY    . TUBAL LIGATION     Social History:  reports that she quit smoking about 59 years ago. Her smoking use included cigarettes. She has never used smokeless tobacco. She reports current alcohol use. She reports that she does not use drugs.  Family / Support Systems Marital Status: Married Patient Roles: Spouse, Parent Spouse/Significant Other: Fulton Reek [id number]  [id number] Children: Berneda Rose [phone] Other Supports: Son in [place] Anticipated Caregiver: Husband and daughter Ability/Limitations of Caregiver: Daughter works husband has no limitations Caregiver Availability: 24/7 Family Dynamics: Close knit family who will pull together when pt needs assist. Son is coming back 11/9 to see if all right and help Dad. Husband voiced they have good supports via church members and friends.  Social History Preferred language: English Religion:  Cultural Background: No issues Education: HS Read: Yes Write: Yes Employment Status: Retired Public relations account executive Issues: No issues Guardian/Conservator: none-according to MD pt is not fully capable of making her own decisions at this time will look toward her husband to make any decisions while here   Abuse/Neglect Abuse/Neglect Assessment Can Be Completed: Yes Physical Abuse: Denies Verbal Abuse: Denies Sexual Abuse: Denies Exploitation of patient/patient's  resources: Denies Self-Neglect: Denies  Emotional Status Pt's affect, behavior and adjustment status: Husband voiced she was doing well prior to this. She could shower herself and ambulated with a walker at times she would ambulate without any device. He would do most of the home management since she could notdo this. Recent Psychosocial Issues: Other health issues-past history of CVA"s-left residual deficits Psychiatric History: no history has been started on seraqual for sundowning. Pt is not able to communicate due to speech deficits. Husband voiced she seems to listen and understand what is being said to her and does respond at times. Will continue to assess while here Substance Abuse History: No issues  Patient / Family Perceptions, Expectations & Goals Pt/Family understanding of illness & functional limitations: Pt seems ot understand reason she is here and husband can explain her deficits and knows her treatment plan going forward. He does talk with the MD and feels he has a good understanding of where she is now. He takes notes also. Premorbid pt/family roles/activities: Wife, Mother, retiree, church member, etc Anticipated changes in roles/activities/participation: resume Pt/family expectations/goals: Pt nods and shakes her head. Hsband states: " I hope she can get back to where  she was before this and to get that tube out of her nose."  [employer]: Other (Comment) Premorbid Home Care/DME Agencies: Other (Comment)(has DME and has had HH in the past) Transportation available at discharge: Husband Resource referrals recommended: Support group (specify)  Discharge Planning Living Arrangements: Spouse/significant other Support Systems: Spouse/significant other, Children, Friends/neighbors, Church/faith community Type of Residence: Private residence Insurance Resources: Multimedia programmer (specify)(UHC-Medicare) Financial Resources: Social Security Financial  Screen Referred: No Living Expenses: Own Money Management: Spouse Does the patient have any problems obtaining your medications?: No Home Management: husband was doing the home management due to pt's residual issues from past CVA Patient/Family Preliminary Plans: Return home with hsband who is able to provide assist to her and was prior to admission. He is very supportive and will assist her at DC. He is in good health and their local daughter will assist also when able. Will await team eval and work on discharge needs. Social Work Anticipated Follow Up Needs: HH/OP, Support Group  Clinical Impression Pleasant female who seems to know what is being said and nods her head. Husband is very involved and supportive and will assist at discharge. He was prior to admission due to past history of CVA's. Will await team evaluations and work on discharge needs.  Elease Hashimoto 09/18/2019, 12:57 PM

## 2019-09-18 NOTE — Progress Notes (Signed)
Patient has history of OSA and wears CPAP at home according to husband with a pressure of 4 and 2 L of oxygen. Patient was placed on a CPAP with a full face mask and 3 LPM oxygen per Spo2. CPAP pressure of 4. Patient tolerating at this time. Vitals stable. Patient is getting EEG at this time.

## 2019-09-18 NOTE — Plan of Care (Signed)
Called RN for update. Seems that pt still sleeping heavy after 4mg  ativan, on CPAP, saturation the pulse look good.. CT done showed expected evolution of left MCA infarct, no hemorrhage no mass-effect.  Aspect score 10/10.  Stat EEG done showed moderate to severe diffuse encephalopathy, no seizure or epileptiform discharges.  Continue Keppra.  Will follow in a.m.  Rosalin Hawking, MD PhD Stroke Neurology 09/18/2019 6:22 PM

## 2019-09-18 NOTE — Progress Notes (Signed)
Initial Nutrition Assessment  DOCUMENTATION CODES:   Not applicable  INTERVENTION:  Recommend obtaining new weight to fully assess weight trends.   Increase Jevity 1.2 formula via post pyloric NGT by 10 ml every 4 hours to new goal rate of 70 ml/hr x 20 hours (may hold TF for up to 4 hours for therapy).  Continue 200 ml free water flushes every 6 hours. (MD to adjust as appropriate)  Tube feeding to provide 1680 kcal, 78 grams of protein, and 1934 ml free water.   NUTRITION DIAGNOSIS:   Inadequate oral intake related to inability to eat as evidenced by NPO status.  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 Presented 09/11/2019 nonverbal with right side weakness. CT angiogram of head and neck showed left distal M1 embolism extending into proximal M2 branches with poorly enhancing downstream vessels. Remote moderate left lateral frontal and upper insular infarction. CT perfusion showed a 38 cc acute infarct and 56 cc of superimposed penumbra. Underwent cerebral angiogram with complete revascularization of occluded Lt LMCA M1 segment. MRI follow-up showed acute cortical infarct involving majority of left MCA distribution. Patient is currently n.p.o. with nasogastric tube feeds.  Pt NPO and has been tolerating her tube feeds with no difficulties. Pt nonverbal, aphasic. RD to modify tube feeding orders to adjust for 20 hours to allow tube feeds to be on hold for 4 hours for therapies. RD to continue to monitor for tolerance. Noted question weight accuracy as pt  weight 72.2 kg on 11/2. Labs and medications reviewed.   NUTRITION - FOCUSED PHYSICAL EXAM:    Most Recent Value  Orbital Region  No depletion  Upper Arm Region  No depletion  Thoracic and Lumbar Region  No depletion  Buccal Region  No depletion  Temple Region  No depletion  Clavicle Bone Region  No depletion  Clavicle and Acromion Bone Region  No depletion  Scapular Bone Region  No depletion  Dorsal Hand  No depletion  Patellar Region  No depletion  Anterior Thigh Region  No depletion  Posterior Calf Region  No depletion  Edema (RD Assessment)  Mild  Hair  Reviewed  Eyes  Reviewed  Mouth  Reviewed  Skin  Reviewed  Nails  Reviewed       Diet Order:   Diet Order            Diet NPO time specified  Diet effective now              EDUCATION NEEDS:   Not appropriate for education at this time  Skin:  Skin Assessment: Reviewed RN Assessment  Last BM:  11/4  Height:   Ht Readings from Last 1 Encounters:  09/17/19 5' (1.524 m)    Weight:   Wt Readings from Last 1 Encounters:  09/18/19 (!) 160 kg  09/16/19 72.2 kg (159 lbs)  Ideal Body Weight:  45.45 kg  BMI:  Body mass index is 68.89 kg/m.  Estimated Nutritional Needs:   Kcal:  1600-1800  Protein:  75-90 grams  Fluid:  1.6 - 1.8 L/day    [name]  Cecilie Lowers, MS, RD, LDN Pager # [phone] After hours/ weekend pager # [phone]

## 2019-09-18 NOTE — Evaluation (Signed)
Physical Therapy Assessment and Plan  Patient Details  Name: April Le MRN: [id number] Date of Birth: 07/14/1941  PT Diagnosis: Abnormal posture, Abnormality of gait, Hemiplegia dominant and Muscle weakness Rehab Potential: Fair ELOS: 3-3.5 weeks   Today's Date: 09/18/2019 PT Individual Time: 1430-1500 PT Individual Time Calculation (min): 30 min    Problem List:  Patient Active Problem List   Diagnosis Date Noted  . 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 Hx of completed stroke 09/16/2019  . Dysphagia following cerebrovascular accident (CVA) 09/16/2019  . Malnutrition ([place]) 09/16/2019  . Acute blood loss anemia 09/16/2019  . Thrombocytopenia ([place]) 09/16/2019  . Acute ischemic left MCA stroke ([facility]) s/p IR, embolic source unknown 19/75/8832  . 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 Bilateral lower extremity edema 07/18/2019  . Weakness of left leg   . Urinary incontinence   . Tumor of ovary   . Stroke ([place])   . Sinus drainage   . Shortness of breath   . Pneumonia   . Hypothyroidism   . Hypertension   . Hypercholesteremia   . Headache   . GERD (gastroesophageal reflux disease)   . Complication of anesthesia   . CKD (chronic kidney disease), stage III   . Arthritis   . 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 Abdominal distention   . Coronary artery calcification seen on CT scan 01/10/2017  . Hypertensive heart disease with heart failure ([place]) 01/10/2017  . Abnormality of gait 10/28/2015  . 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 Chronic diastolic heart failure ([place]) 11/19/2013  . Benign essential HTN 11/19/2013  . OSA (obstructive sleep apnea) 11/19/2013  . CAP (community acquired pneumonia) 09/07/2013  . Leukocytosis 09/04/2013  . Fever of unknown origin 09/04/2013  . Hypoxia 09/04/2013  . SOB (shortness of breath) 09/04/2013  . SIRS (systemic inflammatory response syndrome) ([place])  09/04/2013  . Mass of ovary 10/26/2011    Past Medical History:  Past Medical History:  Diagnosis Date  . Abdominal distention    past two months  . Abnormality of gait 10/28/2015  . 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 Arthritis    right knee  . Benign essential HTN 11/19/2013  . CAP (community acquired pneumonia) 09/07/2013  . CHF (congestive heart failure) ([place])   . Chronic diastolic heart failure ([place]) 11/19/2013  . Chronic kidney disease    STAGE III KIDNEY DISEASE-PER PT--PT SEES DR. Risa Grill UROLOGIST  . Complication of anesthesia    PT REMEMBERS BREATHING PROBLEMS WAKING UP FROM KNEE SURGERY AT [facility] 2011 OR 2012  . Coronary artery calcification seen on CT scan 01/10/2017  . Fever of unknown origin 09/04/2013  . GERD (gastroesophageal reflux disease)   . Headache   . Headache(784.0)   . 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 Hypercholesteremia   . Hypertension   . Hypertensive heart disease with heart failure ([place]) 01/10/2017  . Hypothyroidism   . Hypoxia 09/04/2013  . Leukocytosis 09/04/2013  . Mass of ovary 10/26/2011  . OSA (obstructive sleep apnea) 11/19/2013  . Pneumonia   . Shortness of breath    WITH EXERTION  . Sinus drainage    PT STARTED ANTIBIOTIC 11/17/11 --IS HOARSE TODAY, SOME WHEEZING AND COUGH WITH YELLOW DRAINAGE  . SIRS (systemic inflammatory response syndrome) ([place]) 09/04/2013  . SOB (shortness  of breath) 09/04/2013  . Stroke ([place]) 02/2007, 08/2009   2 total -RESIDUAL WEAKNESS LEFT LEG--AND APHASIA  . Stroke ([place]) 09/01/2017  . Stroke ([place]) 09/08/2017  . Tumor of ovary   . Urinary incontinence   . Urinary incontinence   . Weakness of left leg    Mildly, post stroke   Past Surgical History:  Past Surgical History:  Procedure Laterality Date  . ABDOMINAL HYSTERECTOMY    . CHOLECYSTECTOMY    . IR CT HEAD LTD  09/11/2019  . IR PERCUTANEOUS ART  THROMBECTOMY/INFUSION INTRACRANIAL INC DIAG ANGIO  09/11/2019  . LAPAROTOMY  11/22/2011   Procedure: EXPLORATORY LAPAROTOMY;  Surgeon: Imagene Gurney A. Alycia Rossetti, MD;  Location: [facility];  Service: Gynecology;  Laterality: N/A;  . LOOP RECORDER INSERTION N/A 09/16/2019   Procedure: LOOP RECORDER INSERTION;  Surgeon: Evans Lance, MD;  Location: [facility];  Service: Cardiovascular;  Laterality: N/A;  . MENISCUS REPAIR    . RADIOLOGY WITH ANESTHESIA N/A 09/11/2019   Procedure: IR WITH ANESTHESIA;  Surgeon: Radiologist, Medication, MD;  Location: [facility];  Service: Radiology;  Laterality: N/A;  . SALPINGOOPHORECTOMY  11/22/2011   Procedure: SALPINGO OOPHERECTOMY;  Surgeon: Imagene Gurney A. Alycia Rossetti, MD;  Location: [facility];  Service: Gynecology;  Laterality: Bilateral;  . TOE SURGERY    . TONSILLECTOMY    . 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 Per chart review patient lives with spouse. Two-level home with bed and bath on main level. Independent with assistive device. Patient prefers to take sponge baths. Presented 09/11/2019 nonverbal with right side weakness. SARS Covid negative, creatinine 1.25, hemoglobin 11.8, WBC 10,700. Cranial CT scan showed no hemorrhage or visible acute infarction. Patient did not receive TPA. CT angiogram of head and neck showed left distal M1 embolism extending into proximal M2 branches with poorly enhancing downstream vessels. Remote moderate left lateral frontal and upper insular infarction. CT perfusion showed a 38 cc acute infarct and 56 cc of superimposed penumbra. Underwent cerebral angiogram with complete revascularization of occluded Lt LMCA M1 segment. MRI follow-up showed acute cortical infarct  involving majority of left MCA distribution. There was patchy petechial hemorrhage. Echocardiogram with ejection fraction of 65%. Lower extremity Dopplers negative for DVT. Neurology follow-up currently on aspirin 325 mg daily as well as Plavix. Loop recorder has been placed. Patient remains on Lovenox for DVT prophylaxis. Patient is currently n.p.o. with nasogastric tube feeds. Patient has had episodes of sundowning maintained on Aricept as well as Seroquel. Patient with episode of shortness of breath respiratory 26 oxygen saturation 97% 09/17/2019 there was some copious secretions.chest x-ray completed showing no active disease patient did receive an extra dose of Lasix and troponin was negative  Patient transferred to CIR on 09/17/2019 .   Patient currently requires total with mobility secondary to decreased cardiorespiratoy endurance and decreased oxygen support, abnormal tone, unbalanced muscle activation, motor apraxia, ataxia, decreased coordination and decreased motor planning, decreased initiation, decreased awareness, decreased problem solving, decreased safety awareness and decreased memory and decreased sitting balance, decreased standing balance, decreased postural control, hemiplegia and decreased balance strategies.  Prior to hospitalization, patient was modified independent  with mobility and lived with Spouse in a House home.  Home access is  Level entry.  Patient  will benefit from skilled PT intervention to maximize safe functional mobility, minimize fall risk and decrease caregiver burden for planned discharge home with 24 hour assist.  Anticipate patient will benefit from follow up [facility] at discharge.  PT - End of Session Activity Tolerance: Tolerates < 10 min activity with changes in vital signs Endurance Deficit: Yes Endurance Deficit Description: muliple rest breaks secondary to fatigue PT Assessment Rehab Potential (ACUTE/IP ONLY): Fair PT Barriers to Discharge: Insurance for SNF  coverage;Medical stability PT Patient demonstrates impairments in the following area(s): Balance;Behavior;Edema;Endurance;Motor;Nutrition;Pain;Perception;Safety;Sensory;Skin Integrity PT Transfers Functional Problem(s): Bed Mobility;Car;Other (comment);Furniture;Floor;Bed to Chair PT Locomotion Functional Problem(s): Ambulation;Wheelchair Mobility;Stairs PT Plan PT Intensity: Minimum of 1-2 x/day ,45 to 90 minutes PT Frequency: 5 out of 7 days PT Duration Estimated Length of Stay: 3-3.5 weeks PT Treatment/Interventions: Ambulation/gait training;Cognitive remediation/compensation;Balance/vestibular training;Disease management/prevention;Community reintegration;DME/adaptive equipment instruction;Discharge planning;Patient/family education;Functional mobility training;Functional electrical stimulation;Pain management;Neuromuscular re-education;Psychosocial support;Splinting/orthotics;Skin care/wound management;Therapeutic Exercise;Therapeutic Activities;UE/LE Coordination activities;Stair training;UE/LE Strength taining/ROM;Visual/perceptual remediation/compensation;Wheelchair propulsion/positioning PT Transfers Anticipated Outcome(s): Min assist with LRAD PT Locomotion Anticipated Outcome(s): ambulatory for short distances with min assist and LRAD PT Recommendation Follow Up Recommendations: Skilled nursing facility;Home health PT Patient destination: Home Equipment Recommended: To be determined  Skilled Therapeutic Intervention  Pt received supine in bed and agreeable to PT. Supine>sit transfer with  Mod assist and mod cues for sequencing.    Sit<>stand from EOB with min assist. Pt then performed stand pivot transfer to the [facility] with RW and min assist for safety.   Pt transported top rehab gym in [facility]. SpO2 assessed on room air 96%, HR 74. Following Vital assessed, Pt suddenly noted to have to have Hyperflexed RUE and hyper extended RLE with head and eyes up and to the R with seizure like activity  in all 4 limbs. Pt's breaths became shallow and inconsistent with mild cyanosis around the mouth. NT, rehab tech RN and PT transported pt to room in [facility] and total assist to Bed. Pt remained unresponsive. HR assessed 63 as RN assessed Vitals via Dinamap. Rapid response then present and this PT gave report on pt status and left pt in bed with multiple RNs and Rapid response present.     PT Evaluation Precautions/Restrictions Precautions Precautions: Fall Precaution Comments: cortrak Restrictions Weight Bearing Restrictions: No General   Vital SignsTherapy Vitals Temp: 98.2 F (36.8 C) Temp Source: Oral Pulse Rate: 67 Resp: 20 BP: (!) 172/82 Patient Position (if appropriate): Lying Oxygen Therapy SpO2: 99 % O2 Device: NRB Pain Pain Assessment Pain Scale: Faces Faces Pain Scale: No hurt Home Living/Prior Functioning Home Living Living Arrangements: Spouse/significant other Prior Function Level of Independence: Requires assistive device for independence Vision/Perception  Vision - Assessment Additional Comments: R inattention and  undershooting depth perception. Unable to formally assess on evaluation. Will assess further in tx sessions  Cognition Arousal/Alertness: Awake/alert Memory: (aphasia) Immediate Memory Recall: (aphasia) Sensation Sensation Light Touch: Appears Intact Hot/Cold: Appears Intact Coordination Gross Motor Movements are Fluid and Coordinated: No Fine Motor Movements are Fluid and Coordinated: No Coordination and Movement Description: apraxic and uncoordinated movements Motor  Motor Motor: Motor apraxia Motor - Skilled Clinical Observations: R hemiparesis  Mobility Bed Mobility Bed Mobility: Rolling Right;Rolling Left;Supine to Sit;Sit to Supine Rolling Right: Moderate Assistance - Patient 50-74% Rolling Left: Moderate Assistance - Patient 50-74% Supine to Sit: Moderate Assistance - Patient 50-74% Sit to Supine: Moderate Assistance - Patient  50-74% Transfers Transfers: Stand Pivot Transfers Stand Pivot Transfers: Moderate Assistance - Patient 50 - 74% Transfer (Assistive device): Rolling  walker Locomotion    unable to assess  Trunk/Postural Assessment  Cervical Assessment Cervical Assessment: Exceptions to WFL(forward head) Thoracic Assessment Thoracic Assessment: Exceptions to WFL(kyphotic) Lumbar Assessment Lumbar Assessment: Exceptions to WFL(posterior pelvic tilt)  Balance Balance Balance Assessed: Yes Dynamic Sitting Balance Dynamic Sitting - Balance Support: During functional activity Dynamic Sitting - Level of Assistance: 5: Stand by assistance Dynamic Sitting - Balance Activities: Reaching for objects Static Standing Balance Static Standing - Balance Support: During functional activity Static Standing - Level of Assistance: 4: Min assist Dynamic Standing Balance Dynamic Standing - Balance Support: During functional activity Dynamic Standing - Level of Assistance: 4: Min assist;3: Mod assist Extremity Assessment  RUE Assessment RUE Assessment: Exceptions to [facility] Passive Range of Motion (PROM) Comments: WFLs Active Range of Motion (AROM) Comments: WFLs General Strength Comments: 3-/5 LUE Assessment LUE Assessment: Exceptions to [facility] Passive Range of Motion (PROM) Comments: WFLs Active Range of Motion (AROM) Comments: WFLs General Strength Comments: 3-/5 RLE Assessment RLE Assessment: Exceptions to [facility] General Strength Comments: initially grossly 4-/5 proximal to distal through functional movement. and then pt unresponsive with no active movement in all extremities . LLE Assessment LLE Assessment: Exceptions to [facility] General Strength Comments: initially grossly 4-/5 proximal to distal through functional movement. and then pt unresponsive with no active movement in all extremities    Refer to Care Plan for Long Term Goals  Recommendations for other services: None   Discharge Criteria: Patient will be  discharged from PT if patient refuses treatment 3 consecutive times without medical reason, if treatment goals not met, if there is a change in medical status, if patient makes no progress towards goals or if patient is discharged from hospital.  The above assessment, treatment plan, treatment alternatives and goals were discussed and mutually agreed upon: by patient  Lorie Phenix 09/18/2019, 4:31 PM

## 2019-09-18 NOTE — Progress Notes (Signed)
EEG completed, results pending. 

## 2019-09-18 NOTE — H&P (Signed)
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 Per chart review patient lives with spouse. Two-level home with bed and bath on main level. Independent with assistive device. Patient prefers to take sponge baths. Presented 09/11/2019 nonverbal with right side weakness. SARS Covid negative, creatinine 1.25, hemoglobin 11.8, WBC 10,700. Cranial CT scan showed no hemorrhage or visible acute infarction. Patient did not receive TPA. CT angiogram of head and neck showed left distal M1 embolism extending into proximal M2 branches with poorly enhancing downstream vessels. Remote moderate left lateral frontal and upper insular infarction. CT perfusion showed a 38 cc acute infarct and 56 cc of superimposed penumbra. Underwent cerebral angiogram with complete revascularization of occluded Lt LMCA M1 segment. MRI follow-up showed acute cortical infarct involving majority of left MCA distribution. There was patchy petechial hemorrhage. Echocardiogram with ejection fraction of 65%. Lower extremity Dopplers negative for DVT. Neurology follow-up currently on aspirin 325 mg daily as well as Plavix. Loop recorder has been placed. Patient remains on Lovenox for DVT prophylaxis. Patient is currently n.p.o. with nasogastric tube feeds. Patient has had episodes of sundowning maintained on Aricept as well as Seroquel. Patient with episode of shortness of breath respiratory 26 oxygen saturation 97% 09/17/2019 there was some copious secretions.chest x-ray completed showing no active disease patient did receive an extra dose of Lasix and troponin was negative. Therapy evaluations completed and patient was admitted for a comprehensive rehab program  Pt has  mittens that were placed today, per NT because she pulled out her NGT last night requiring a Coretrak, and kept attempting to pull out IVs.  Her husband was at bedside and said she's been listening to him "some". Shook her head no when asked about pain, but 90% of time shook her head no to any question.  Review of Systems  Unable to perform ROS: Acuity of condition       Past Medical History:  Diagnosis Date  . Abdominal distention    past two months  . Abnormality of gait 10/28/2015  . Aphasia due to acute stroke ([place])   . 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 Arthritis    right knee  . Benign essential HTN 11/19/2013  . CAP (community acquired pneumonia) 09/07/2013  . CHF (congestive heart failure) ([place])   . Chronic diastolic heart failure ([place]) 11/19/2013  . Chronic kidney disease    STAGE III KIDNEY DISEASE-PER PT--PT SEES DR. Risa Grill UROLOGIST  . Complication of anesthesia    PT REMEMBERS BREATHING PROBLEMS WAKING UP FROM KNEE SURGERY AT [facility] 2011 OR 2012  . Coronary artery calcification seen on CT scan 01/10/2017  . Fever of unknown origin 09/04/2013  . GERD (gastroesophageal reflux disease)   . Headache   . Headache(784.0)   . 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) 10/28/2015  . Hypercholesteremia   . Hypertension   . Hypertensive heart disease with heart failure ([place]) 01/10/2017  . Hypothyroidism   . Hypoxia 09/04/2013  . Leukocytosis 09/04/2013  . Mass of ovary 10/26/2011  . OSA (obstructive sleep apnea) 11/19/2013  . Pneumonia   . Shortness of breath    WITH EXERTION  . Sinus drainage    PT STARTED ANTIBIOTIC 11/17/11 --IS HOARSE TODAY, SOME WHEEZING AND COUGH  WITH YELLOW DRAINAGE  . SIRS (systemic inflammatory response syndrome) ([place]) 09/04/2013  . SOB (shortness of breath) 09/04/2013  . Stroke ([place]) 02/2007, 08/2009   2 total -RESIDUAL WEAKNESS LEFT LEG--AND APHASIA  . Stroke ([place]) 09/01/2017  . Stroke ([place]) 09/08/2017  .  Tumor of ovary   . Urinary incontinence   . Urinary incontinence   . Weakness of left leg    Mildly, post stroke        Past Surgical History:  Procedure Laterality Date  . ABDOMINAL HYSTERECTOMY    . CHOLECYSTECTOMY    . IR CT HEAD LTD  09/11/2019  . IR PERCUTANEOUS ART THROMBECTOMY/INFUSION INTRACRANIAL INC DIAG ANGIO  09/11/2019  . LAPAROTOMY  11/22/2011   Procedure: EXPLORATORY LAPAROTOMY; Surgeon: Imagene Gurney A. Alycia Rossetti, MD; Location: [facility]; Service: Gynecology; Laterality: N/A;  . MENISCUS REPAIR    . RADIOLOGY WITH ANESTHESIA N/A 09/11/2019   Procedure: IR WITH ANESTHESIA; Surgeon: Radiologist, Medication, MD; Location: [facility]; Service: Radiology; Laterality: N/A;  . SALPINGOOPHORECTOMY  11/22/2011   Procedure: SALPINGO OOPHERECTOMY; Surgeon: Imagene Gurney A. Alycia Rossetti, MD; Location: [facility]; Service: Gynecology; Laterality: Bilateral;  . TOE SURGERY    . TONSILLECTOMY    . TUBAL LIGATION          Family History  Problem Relation Age of Onset  . Kidney failure Mother   . Heart attack Father   . Heart failure Father   . Stomach cancer Other   . Stomach cancer Maternal Grandmother   . Stroke Paternal Grandmother    Social History: reports that she quit smoking about 59 years ago. Her smoking use included cigarettes. She has never used smokeless tobacco. She reports current alcohol use. She reports that she does not use drugs.  Allergies:       Allergies  Allergen Reactions  . Amoxicillin-Pot Clavulanate Diarrhea    GI INTOLERANCE ONLY WITH DIARRHEA         Medications Prior to Admission  Medication Sig Dispense Refill  . acetaminophen (TYLENOL) 500 MG tablet Take 500 mg by mouth every 6 (six) hours as needed for mild pain.    Marland Kitchen aspirin 81 MG tablet Take 81 mg by mouth daily with breakfast.     . atorvastatin (LIPITOR) 40 MG tablet Take 40 mg by mouth at bedtime.     . clopidogrel (PLAVIX) 75 MG tablet Take 75 mg by mouth daily.     Marland Kitchen donepezil (ARICEPT) 5 MG tablet Take 5 mg by mouth  daily.    . febuxostat (ULORIC) 40 MG tablet Take 40 mg by mouth daily.     . furosemide (LASIX) 40 MG tablet Take 1 tablet (40 mg total) by mouth 2 (two) times daily. 180 tablet 1  . hydrALAZINE (APRESOLINE) 25 MG tablet Take 0.5 tablet (12.5 mg) twice daily if your systolic blood pressure (top number) is consistently greater than 140 (Patient taking differently: Take 25 mg by mouth 2 (two) times daily. Take 0.5 tablet (12.5 mg) twice daily if your systolic blood pressure (top number) is consistently greater than 140) 60 tablet 3  . levothyroxine (SYNTHROID, LEVOTHROID) 50 MCG tablet Take 50 mcg by mouth daily at 6 (six) AM.     . metoprolol (LOPRESSOR) 50 MG tablet Take 50 mg by mouth 2 (two) times daily.     . Multiple Vitamins-Minerals (MULTI-VITAMIN GUMMIES PO) Take 1 tablet by mouth daily.    . pantoprazole (PROTONIX) 40 MG tablet Take 40 mg by mouth daily.     Marland Kitchen  potassium chloride SA (K-DUR,KLOR-CON) 20 MEQ tablet Take 20 mEq by mouth daily.      Drug Regimen Review  Drug regimen was reviewed and remains appropriate with no significant issues identified  Home:  Home Living  Family/patient expects to be discharged to:: Private residence  Living Arrangements: Spouse/significant other  Available Help at Discharge: Family, Available 24 hours/day, Other (Comment)  Type of Home: House  Home Access: Level entry  Home Layout: Two level, Able to live on main level with bedroom/bathroom  Alternate Level Stairs-Number of Steps: flight  Alternate Level Stairs-Rails: Right, Left  Bathroom Shower/Tub: Walk-in Designer, television/film set: Handicapped height  Bathroom Accessibility: Yes  Home Equipment: Environmental consultant - 2 wheels, Cane - single point, Civil engineer, contracting, Wheelchair - manual  Additional Comments: daughter present at eval  Functional History:  Prior Function  Level of Independence: Independent with assistive device(s)  Comments: pt takes more sponge baths than showers, but has been able to shower  alone, go to bathroom without assist, brush teeth and donn clothes independently  Functional Status:  Mobility:  Bed Mobility  Overal bed mobility: Needs Assistance  Bed Mobility: Supine to Sit, Sit to Supine  Supine to sit: Min guard  Sit to supine: Min guard  General bed mobility comments: Min guard for safety, cues and encouragement to bring trunk up and scoot to EOB  Transfers  Overall transfer level: Needs assistance  Equipment used: None  Transfers: Sit to/from Stand  Sit to Stand: Min assist, +2 physical assistance  General transfer comment: MinAx2 for boost to full standing position, mild posterior bias but able to correct with MinA  Ambulation/Gait  Ambulation/Gait assistance: Mod assist, +2 physical assistance  Gait Distance (Feet): 40 Feet  Assistive device: 2 person hand held assist  Gait Pattern/deviations: Step-through pattern, Decreased step length - left, Decreased step length - right, Decreased stride length, Shuffle, Trunk flexed, Drifts right/left, Wide base of support  General Gait Details: wide BOS and ModAx2 for safety and balance; patient frequently letting go of R UE and grabbing for wall, cues to improve step length and balance provided. Fatigued once out in hallway, non-verbally requested return to bed  Gait velocity: slow  Gait velocity interpretation: <1.31 ft/sec, indicative of household ambulator   ADL:  ADL  Overall ADL's : Needs assistance/impaired  Eating/Feeding: NPO  Eating/Feeding Details (indicate cue type and reason): pt currently with feeding tube- daughter with questions about deating again  Grooming: Oral care, Wash/dry face, Moderate assistance, Standing, Brushing hair  Grooming Details (indicate cue type and reason): completed while standing at sink level;gestural cues to use comb and toothbrush;apraxia noted with use of toothbrush to comb hair;demonstrates short rom with combing hair, requires hand over hand assistance for gross movements   Upper Body Bathing: Moderate assistance, Sitting  Lower Body Bathing: Total assistance  Toilet Transfer: Moderate assistance, RW, Regular Toilet, Ambulation  Toilet Transfer Details (indicate cue type and reason): simulated to chair at sink and return to bed  Toileting- Clothing Manipulation and Hygiene: Total assistance  Functional mobility during ADLs: Moderate assistance, Rolling walker  General ADL Comments: appeared to demonstrate right side inattention, bumping into items on right side  Cognition:  Cognition  Overall Cognitive Status: Impaired/Different from baseline  Arousal/Alertness: Awake/alert  Orientation Level: Oriented to person  Attention: Sustained  Sustained Attention: Appears intact  Memory: Impaired  Memory Impairment: Decreased recall of new information(Place)  Awareness: Impaired  Awareness Impairment: Intellectual impairment  Problem Solving: (UTA)  Executive Function: Initiating  Initiating: Impaired  Initiating Impairment: Verbal basic, Functional basic  Safety/Judgment: Impaired  Cognition  Arousal/Alertness: Awake/alert  Behavior During Therapy: Flat affect  Overall Cognitive Status: Impaired/Different from baseline  Area of Impairment: Attention, Following commands, Awareness, Memory, Problem solving  Current Attention Level: Sustained  Memory: Decreased recall of precautions, Decreased short-term memory  Following Commands: Follows one step commands inconsistently, Follows one step commands with increased time  Awareness: Intellectual  Problem Solving: Slow processing, Difficulty sequencing, Requires verbal cues, Requires tactile cues  General Comments: ongoing apraxia but improving with gross gait and transfer tasks, some R inattention, did well with gestures. Seemed motivated to get up with PT  Physical Exam:  Blood pressure 132/77, pulse 61, temperature 98.4 F (36.9 C), temperature source Oral, resp. rate 20, height 5' (1.524 m), weight 72.2 kg,  SpO2 95 %.  Physical Exam  Nursing note and vitals reviewed.  Constitutional:  Elderly female sitting in chair at bedside; husband and NT at side, on TFs continuous, not bolus, wearing B/L mittens, NAD; appears a little frail; global vs expressive aphasia  HENT:  Head: Normocephalic and atraumatic.  Nasogastric tube in place./Coretrak on R nare  Eyes:  Wasn't able to comply with most of cranial nerve exam- appeared to follow/track me around the room, didn't see nystagmus; R facial droop- refused/unable to stick tongue out.  Neck: Normal range of motion. Neck supple. No tracheal deviation present.  Cardiovascular:  RRR  Respiratory:  Good air movement B/L - sounds a little congested when coughed audibly, but clear when listened- No W/R/R  GI:  Soft, NT, ND, (+)BS- LBM this AM per husband  Musculoskeletal:  Comments: Was able to grip my hands B/L through mittens and wiggled toes B/L when asked, but unable to either understand/comply when asked to lift either leg or arm.  Good ROM passively.  Neurological:  Patient is alert but has expressive vs global aphasia which limited overall exam. There was a right facial droop. Cannot test sensation due to aphasia and pt unable to comply with coordination exam.  Skin: Skin is warm and dry.  Significant large bruising on upper arms- IVs. Vs blood draws  Psychiatric:  Global vs receptive aphasia   Lab Results Last 48 Hours        Results for orders placed or performed during the hospital encounter of 09/11/19 (from the past 48 hour(s))  Glucose, capillary Status: Abnormal   Collection Time: 09/15/19 8:15 AM  Result Value Ref Range   Glucose-Capillary 128 (H) 70 - 99 mg/dL   Comment 1 Notify RN    Comment 2 Document in Chart   Glucose, capillary Status: Abnormal   Collection Time: 09/15/19 12:34 PM  Result Value Ref Range   Glucose-Capillary 115 (H) 70 - 99 mg/dL   Comment 1 Notify RN    Comment 2 Document in Chart   Glucose, capillary  Status: Abnormal   Collection Time: 09/15/19 4:51 PM  Result Value Ref Range   Glucose-Capillary 117 (H) 70 - 99 mg/dL   Comment 1 Notify RN    Comment 2 Document in Chart   Glucose, capillary Status: Abnormal   Collection Time: 09/15/19 9:01 PM  Result Value Ref Range   Glucose-Capillary 124 (H) 70 - 99 mg/dL  Glucose, capillary Status: Abnormal   Collection Time: 09/15/19 11:39 PM  Result Value Ref Range   Glucose-Capillary 109 (H) 70 - 99 mg/dL  Basic metabolic panel Status: Abnormal   Collection Time: 09/16/19 3:35 AM  Result Value  Ref Range   Sodium 141 135 - 145 mmol/L   Potassium 3.7 3.5 - 5.1 mmol/L   Chloride 105 98 - 111 mmol/L   CO2 27 22 - 32 mmol/L   Glucose, Bld 113 (H) 70 - 99 mg/dL   BUN 13 8 - 23 mg/dL   Creatinine, Ser 0.96 0.44 - 1.00 mg/dL   Calcium 8.7 (L) 8.9 - 10.3 mg/dL   GFR calc non Af Amer 56 (L) >60 mL/min   GFR calc Af Amer >60 >60 mL/min   Anion gap 9 5 - 15    Comment: Performed at [facility] [address], [place], [place] [address]  CBC Status: Abnormal   Collection Time: 09/16/19 3:35 AM  Result Value Ref Range   WBC 7.5 4.0 - 10.5 K/uL   RBC 3.49 (L) 3.87 - 5.11 MIL/uL   Hemoglobin 10.8 (L) 12.0 - 15.0 g/dL   HCT 31.6 (L) 36.0 - 46.0 %   MCV 90.5 80.0 - 100.0 fL   MCH 30.9 26.0 - 34.0 pg   MCHC 34.2 30.0 - 36.0 g/dL   RDW 12.5 11.5 - 15.5 %   Platelets 197 150 - 400 K/uL   nRBC 0.0 0.0 - 0.2 %    Comment: Performed at [facility], [place] [address] [place], [place] [address]  Glucose, capillary Status: Abnormal   Collection Time: 09/16/19 4:11 AM  Result Value Ref Range   Glucose-Capillary 108 (H) 70 - 99 mg/dL  Glucose, capillary Status: Abnormal   Collection Time: 09/16/19 8:12 AM  Result Value Ref Range   Glucose-Capillary 120 (H) 70 - 99 mg/dL  Glucose, capillary Status: Abnormal   Collection Time: 09/16/19 4:20 PM  Result Value Ref Range   Glucose-Capillary 109 (H) 70 - 99 mg/dL  Glucose, capillary Status:  Abnormal   Collection Time: 09/16/19 8:00 PM  Result Value Ref Range   Glucose-Capillary 149 (H) 70 - 99 mg/dL  Glucose, capillary Status: Abnormal   Collection Time: 09/16/19 11:05 PM  Result Value Ref Range   Glucose-Capillary 150 (H) 70 - 99 mg/dL  Glucose, capillary Status: Abnormal   Collection Time: 09/17/19 3:11 AM  Result Value Ref Range   Glucose-Capillary 163 (H) 70 - 99 mg/dL  Basic metabolic panel Status: Abnormal   Collection Time: 09/17/19 3:21 AM  Result Value Ref Range   Sodium 141 135 - 145 mmol/L   Potassium 3.7 3.5 - 5.1 mmol/L   Chloride 106 98 - 111 mmol/L   CO2 27 22 - 32 mmol/L   Glucose, Bld 163 (H) 70 - 99 mg/dL   BUN 15 8 - 23 mg/dL   Creatinine, Ser 0.86 0.44 - 1.00 mg/dL   Calcium 8.5 (L) 8.9 - 10.3 mg/dL   GFR calc non Af Amer >60 >60 mL/min   GFR calc Af Amer >60 >60 mL/min   Anion gap 8 5 - 15    Comment: Performed at [facility], [place] [address], [place], [place] [address]  CBC Status: Abnormal   Collection Time: 09/17/19 3:21 AM  Result Value Ref Range   WBC 7.5 4.0 - 10.5 K/uL   RBC 3.50 (L) 3.87 - 5.11 MIL/uL   Hemoglobin 10.8 (L) 12.0 - 15.0 g/dL   HCT 32.4 (L) 36.0 - 46.0 %   MCV 92.6 80.0 - 100.0 fL   MCH 30.9 26.0 - 34.0 pg   MCHC 33.3 30.0 - 36.0 g/dL   RDW 12.8 11.5 - 15.5 %  Platelets 171 150 - 400 K/uL   nRBC 0.0 0.0 - 0.2 %    Comment: Performed at [facility], [place] [address] [place], [place] [address]   Imaging Results (Last 48 hours)    Medical Problem List and Plan:  1. 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 Status post loop recorder placement  2. Antithrombotics:  -DVT/anticoagulation: Lovenox.  -antiplatelet therapy: Aspirin 325 mg daily, Plavix 75 mg daily  3. Pain Management:  Tylenol as needed  4. Mood: Aricept 5 mg daily, Seroquel 25 mg nightly  -antipsychotic agents: seroquel QHS for sundowning  5. Neuropsych: This patient is not capable of making decisions on her own behalf.  6. Skin/Wound Care: Routine skin checks  7. Fluids/Electrolytes/Nutrition: Routine in and outs with follow-up chemistries  8. 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 Monitor for any signs of fluid overload. Lasix 40 mg twice daily  Monitor daily weights  10. Dysphagia. Nasogastric tube feeds; is NPO- if Coretrak needs to come out due to d/c, will need to place PEG. Follow-up speech therapy  10. CKD stage III. Follow-up chemistries  11. Hypertension. Lopressor 50 mg twice daily. Monitor with increased mobility  12. Hypothyroidism. Synthroid  13. Hyperlipidemia. Lipitor  14. Aphasia- global vs receptive aphasia with confusion- will have pt evaluated by SLP- might benefit from Amantadine?  15. Low grade temp- pt had low grade temp of 100.3 today- will monitor for signs of infection.  Lavon Paganini Angiulli, PA-C  09/17/2019

## 2019-09-18 NOTE — Consult Note (Signed)
Neurology Consultation Note  Consult Requested by: Lauraine Rinne, [place]  Reason for Consult: seizure  Consult Date: 09/18/19  The history was obtained from the PT, CIR PA and rapid response nurse.  During history and examination, all items were able to obtain unless otherwise noted.  History of Present Illness:  April Le is a 78 y.o. Caucasian female with PMH of chronic diastolic heart failure,prior left MCA stroke, hypertension, hyperlipidemia was admitted to [facility] on 09/11/19 with left MCA infarct and left M1 occlusion s/p thrombectomy with TICI3 reperfusion. Pt symptoms improved but with residue global aphasia and dysphagia s/p cortrak. Loop recorder placed. Pt was admitted to CIR yesterday for further rehab. Today, she was working with PT, suddenly she had seizure activity with right UE clenching into air, right LE tonic extension, LUE and LLE shaking, eyes upper right gaze with moaning sound in mouth and cyanosis. Pt was put back in bed. Code stoke activated. Seizure activity lasted 3 min and pt gradually eyes open, but still not responding with intermittent myoclonus. Received 2mg  ativan x 2 and load with keppra 2g. Pt sent to CT stat to rule out bleeding. Plan to have stat EEG after CT.   LSN: 2:45pm tPA Given: No: recent stroke, seizure presentation  Past Medical History:  Diagnosis Date  . Abdominal distention    past two months  . Abnormality of gait 10/28/2015  . Aphasia due to acute stroke ([place])   . 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 Arthritis    right knee  . Benign essential HTN 11/19/2013  . CAP (community acquired pneumonia) 09/07/2013  . CHF (congestive heart failure) ([place])   . Chronic diastolic heart failure ([place]) 11/19/2013  . Chronic kidney disease    STAGE III KIDNEY DISEASE-PER PT--PT SEES DR. Risa Grill UROLOGIST  . Complication of anesthesia    PT REMEMBERS BREATHING PROBLEMS WAKING UP FROM KNEE SURGERY AT [facility] 2011 OR 2012  . Coronary artery  calcification seen on CT scan 01/10/2017  . Fever of unknown origin 09/04/2013  . GERD (gastroesophageal reflux disease)   . Headache   . Headache(784.0)   . 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 Hypercholesteremia   . Hypertension   . Hypertensive heart disease with heart failure ([place]) 01/10/2017  . Hypothyroidism   . Hypoxia 09/04/2013  . Leukocytosis 09/04/2013  . Mass of ovary 10/26/2011  . OSA (obstructive sleep apnea) 11/19/2013  . Pneumonia   . Shortness of breath    WITH EXERTION  . Sinus drainage    PT STARTED ANTIBIOTIC 11/17/11 --IS HOARSE TODAY, SOME WHEEZING AND COUGH WITH YELLOW DRAINAGE  . SIRS (systemic inflammatory response syndrome) ([place]) 09/04/2013  . SOB (shortness of breath) 09/04/2013  . Stroke ([place]) 02/2007, 08/2009   2 total -RESIDUAL WEAKNESS LEFT LEG--AND APHASIA  . Stroke ([place]) 09/01/2017  . Stroke ([place]) 09/08/2017  . Tumor of ovary   . Urinary incontinence   . Urinary incontinence   . Weakness of left leg    Mildly, post stroke    Past Surgical History:  Procedure Laterality Date  . ABDOMINAL HYSTERECTOMY    . CHOLECYSTECTOMY    . IR CT HEAD LTD  09/11/2019  . IR PERCUTANEOUS ART THROMBECTOMY/INFUSION INTRACRANIAL INC DIAG ANGIO  09/11/2019  . LAPAROTOMY  11/22/2011   Procedure: EXPLORATORY LAPAROTOMY;  Surgeon: Imagene Gurney A. Alycia Rossetti, MD;  Location: [facility];  Service: Gynecology;  Laterality: N/A;  . LOOP RECORDER INSERTION N/A  09/16/2019   Procedure: LOOP RECORDER INSERTION;  Surgeon: Evans Lance, MD;  Location: [place] CV LAB;  Service: Cardiovascular;  Laterality: N/A;  . MENISCUS REPAIR    . RADIOLOGY WITH ANESTHESIA N/A 09/11/2019   Procedure: IR WITH ANESTHESIA;  Surgeon: Radiologist, Medication, MD;  Location: [place];  Service: Radiology;  Laterality: N/A;  . SALPINGOOPHORECTOMY  11/22/2011   Procedure: SALPINGO OOPHERECTOMY;  Surgeon: Imagene Gurney A. Alycia Rossetti, MD;  Location: [facility];  Service: Gynecology;   Laterality: Bilateral;  . TOE SURGERY    . TONSILLECTOMY    . TUBAL LIGATION      Family History  Problem Relation Age of Onset  . Kidney failure Mother   . Heart attack Father   . Heart failure Father   . Stomach cancer Other   . Stomach cancer Maternal Grandmother   . Stroke Paternal Grandmother     Social History:  reports that she quit smoking about 59 years ago. Her smoking use included cigarettes. She has never used smokeless tobacco. She reports current alcohol use. She reports that she does not use drugs.  Allergies:  Allergies  Allergen Reactions  . Amoxicillin-Pot Clavulanate Diarrhea    GI INTOLERANCE ONLY WITH DIARRHEA    No current facility-administered medications on file prior to encounter.    Current Outpatient Medications on File Prior to Encounter  Medication Sig Dispense Refill  . acetaminophen (TYLENOL) 325 MG tablet Take 2 tablets (650 mg total) by mouth every 4 (four) hours as needed for mild pain (or temp > 37.5 C (99.5 F)).    Marland Kitchen aspirin 325 MG tablet Place 1 tablet (325 mg total) into feeding tube daily.    Marland Kitchen atorvastatin (LIPITOR) 20 MG tablet Place 1 tablet (20 mg total) into feeding tube daily at 6 PM.    . chlorhexidine (PERIDEX) 0.12 % solution 15 mLs by Mouth Rinse route 2 (two) times daily. 120 mL 0  . clopidogrel (PLAVIX) 75 MG tablet Place 1 tablet (75 mg total) into feeding tube daily.    Marland Kitchen donepezil (ARICEPT) 5 MG tablet Place 1 tablet (5 mg total) into feeding tube daily.    Marland Kitchen enoxaparin (LOVENOX) 40 MG/0.4ML injection Inject 0.4 mLs (40 mg total) into the skin daily. 0 mL   . furosemide (LASIX) 40 MG tablet Place 1 tablet (40 mg total) into feeding tube 2 (two) times daily. 30 tablet   . levothyroxine (SYNTHROID) 50 MCG tablet Place 1 tablet (50 mcg total) into feeding tube daily at 6 (six) AM.    . metoprolol tartrate (LOPRESSOR) 50 MG tablet Place 1 tablet (50 mg total) into feeding tube 2 (two) times daily.    . Nutritional Supplements  (FEEDING SUPPLEMENT, JEVITY 1.2 CAL,) LIQD Place 1,000 mLs into feeding tube continuous.  0  . potassium chloride 20 MEQ/15ML (10%) SOLN Place 15 mLs (20 mEq total) into feeding tube daily. 473 mL 0  . QUEtiapine (SEROQUEL) 25 MG tablet Place 1 tablet (25 mg total) into feeding tube at bedtime.    . Water For Irrigation, Sterile (FREE WATER) SOLN Place 200 mLs into feeding tube every 6 (six) hours.      Review of Systems: A full ROS was attempted today and was able to be performed.  Systems assessed include - Constitutional, Eyes, HENT, Respiratory, Cardiovascular, Gastrointestinal, Genitourinary, Integument/breast, Hematologic/lymphatic, Musculoskeletal, Neurological, Behavioral/Psych, Endocrine, Allergic/Immunologic - with pertinent responses as per HPI.  Physical Examination: Temp:  [97.9 F (36.6 C)-99.1 F (37.3 C)] 98.2 F (36.8 C) (  11/04 1415) Pulse Rate:  [58-80] 64 (11/04 1415) Resp:  [16-20] 20 (11/04 1415) BP: (160-178)/(56-90) 178/70 (11/04 1415) SpO2:  [92 %-100 %] 94 % (11/04 1415) Weight:  [159.9 kg-160 kg] 160 kg (11/04 0416)  General - well nourished, well developed, eyes open but not response.    Ophthalmologic - fundi not visualized due to noncooperation.    Cardiovascular - regular rhythm and rate  Neuro - eyes open but not response to voice, not following commands. Pupils 2.78mm bilaterally, sluggish to light. Eyes mid position bilaterally, not blinking to visual threat bilaterally. Not tracking. Right facial droop. Minimal spontaneous movement in all extremities but not against gravity. Intermittent myoclonus bilaterally but left > right. Sensation, coordination and gait not tested.   Data Reviewed: Ct Code Stroke Cta Head W/wo Contrast  Result Date: 09/11/2019 CLINICAL DATA:  Stroke symptoms EXAM: CT ANGIOGRAPHY HEAD AND NECK CT PERFUSION BRAIN TECHNIQUE: Multidetector CT imaging of the head and neck was performed using the standard protocol during bolus  administration of intravenous contrast. Multiplanar CT image reconstructions and MIPs were obtained to evaluate the vascular anatomy. Carotid stenosis measurements (when applicable) are obtained utilizing NASCET criteria, using the distal internal carotid diameter as the denominator. Multiphase CT imaging of the brain was performed following IV bolus contrast injection. Subsequent parametric perfusion maps were calculated using RAPID software. CONTRAST:  168mL OMNIPAQUE IOHEXOL 350 MG/ML SOLN COMPARISON:  Head CT from earlier today. FINDINGS: Delayed study relative to the noncontrast CT due to difficulty with IV access per report. CTA NECK FINDINGS Aortic arch: Atherosclerotic calcification.  Three vessel branching. Right carotid system: Limited atheromatous changes. No stenosis or ulceration. Left carotid system: Limited atheromatous changes for age. No stenosis or ulceration. Vertebral arteries: Proximal subclavian atherosclerosis is mild. Widely patent vertebral arteries with smooth luminal contour. Skeleton: No acute finding.  Cervical spine degeneration. Other neck: No significant incidental finding Upper chest: Negative Review of the MIP images confirms the above findings CTA HEAD FINDINGS Anterior circulation: Central branching filling defect at the left distal M1 and proximal M2 segments with underfilling of downstream vessels. No right-sided embolism or branch occlusion is seen. Hypoplastic left A1 segment. Atherosclerotic plaque on both carotid siphons mild to moderate narrowing on the right. Posterior circulation: Vertebrobasilar arteries are smooth and widely patent. No branch occlusion or beading. Negative for aneurysm Venous sinuses: Unremarkable in the arterial phase Anatomic variants: As above Review of the MIP images confirms the above findings CT Brain Perfusion Findings: ASPECTS: Not scored given the extent of chronic changes. No acute infarct is detected. CBF (<30%) Volume: 68mL-some of this  mapping may be overestimated as there is likely some bleeding into the chronic infarct the lateral frontal lobe. Perfusion (Tmax>6.0s) volume: 54mL Mismatch Volume: 56 with mismatched ratio of 2.99mL Infarction Location:Left posterior frontal These results were communicated to Dr. Lorraine Lax at [place] 10/28/2020by text page via the [facility] messaging system. Embolectomy procedure has already been ordered. IMPRESSION: 1. 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 There has been a remote moderate left lateral frontal and upper insular infarct; CT perfusion maps show 38 cc of completed acute infarct and 56 cc of superimposed penumbra. 2. Limited atherosclerosis for age. No underlying flow limiting stenosis or embolic source identified. Electronically Signed   By: Monte Fantasia M.D.   On: 09/11/2019 07:27   Dg Abd 1 View  Result Date: 09/16/2019 CLINICAL DATA:  Ten French enteric feeding tube placed via fluoroscopy using  15 mL of Omnipaque 300. EXAM: ABDOMEN - 1 VIEW COMPARISON:  KUB 10/28/2016 FINDINGS: Dobbhoff feeding tube courses through the stomach and duodenum with tip over the proximal jejunum left of midline in the upper abdomen. Contrast is seen filling the proximal jejunum. Bowel gas pattern otherwise unremarkable. Mild degenerate change of the spine. Stable compression fracture over the upper lumbar spine. IMPRESSION: Nonobstructive bowel gas pattern. Dobbhoff feeding tube with tip over the proximal jejunum in the left mid to upper abdomen. Electronically Signed   By: Marin Olp M.D.   On: 09/16/2019 16:16   Ct Code Stroke Cta Neck W/wo Contrast  Result Date: 09/11/2019 CLINICAL DATA:  Stroke symptoms EXAM: CT ANGIOGRAPHY HEAD AND NECK CT PERFUSION BRAIN TECHNIQUE: Multidetector CT imaging of the head and neck was performed using the standard protocol during bolus administration of intravenous contrast. Multiplanar CT image reconstructions and  MIPs were obtained to evaluate the vascular anatomy. Carotid stenosis measurements (when applicable) are obtained utilizing NASCET criteria, using the distal internal carotid diameter as the denominator. Multiphase CT imaging of the brain was performed following IV bolus contrast injection. Subsequent parametric perfusion maps were calculated using RAPID software. CONTRAST:  174mL OMNIPAQUE IOHEXOL 350 MG/ML SOLN COMPARISON:  Head CT from earlier today. FINDINGS: Delayed study relative to the noncontrast CT due to difficulty with IV access per report. CTA NECK FINDINGS Aortic arch: Atherosclerotic calcification.  Three vessel branching. Right carotid system: Limited atheromatous changes. No stenosis or ulceration. Left carotid system: Limited atheromatous changes for age. No stenosis or ulceration. Vertebral arteries: Proximal subclavian atherosclerosis is mild. Widely patent vertebral arteries with smooth luminal contour. Skeleton: No acute finding.  Cervical spine degeneration. Other neck: No significant incidental finding Upper chest: Negative Review of the MIP images confirms the above findings CTA HEAD FINDINGS Anterior circulation: Central branching filling defect at the left distal M1 and proximal M2 segments with underfilling of downstream vessels. No right-sided embolism or branch occlusion is seen. Hypoplastic left A1 segment. Atherosclerotic plaque on both carotid siphons mild to moderate narrowing on the right. Posterior circulation: Vertebrobasilar arteries are smooth and widely patent. No branch occlusion or beading. Negative for aneurysm Venous sinuses: Unremarkable in the arterial phase Anatomic variants: As above Review of the MIP images confirms the above findings CT Brain Perfusion Findings: ASPECTS: Not scored given the extent of chronic changes. No acute infarct is detected. CBF (<30%) Volume: 61mL-some of this mapping may be overestimated as there is likely some bleeding into the chronic  infarct the lateral frontal lobe. Perfusion (Tmax>6.0s) volume: 97mL Mismatch Volume: 56 with mismatched ratio of 2.7mL Infarction Location:Left posterior frontal These results were communicated to Dr. Lorraine Lax at [place] 10/28/2020by text page via the [facility] messaging system. Embolectomy procedure has already been ordered. IMPRESSION: 1. 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 There has been a remote moderate left lateral frontal and upper insular infarct; CT perfusion maps show 38 cc of completed acute infarct and 56 cc of superimposed penumbra. 2. Limited atherosclerosis for age. No underlying flow limiting stenosis or embolic source identified. Electronically Signed   By: Monte Fantasia M.D.   On: 09/11/2019 07:27   Mr Brain Wo Contrast  Result Date: 09/12/2019 CLINICAL DATA:  Mild aphasia. EXAM: MRI HEAD WITHOUT CONTRAST TECHNIQUE: Multiplanar, multiecho pulse sequences of the brain and surrounding structures were obtained without intravenous contrast. COMPARISON:  Code stroke CT from yesterday FINDINGS: Brain: Restricted diffusion throughout the large majority of the  left MCA distribution cortex, with sparing at the margins including along the upper motor strip and in the superficial left temporal lobe. Tiny acute cortical infarct seen in the parasagittal right frontal lobe, ACA territory. Petechial hemorrhage is present along the left cerebral infarct. Moderate remote left MCA branch infarct affecting the lateral frontal lobe and upper insula. Remote right occipital infarct. Small remote right cerebellar infarct. Ischemic gliosis is confluent in the cerebral white matter. Age congruent volume loss. Vascular: Normal flow voids Skull and upper cervical spine: Negative for marrow lesion Sinuses/Orbits: Right cataract resection. Mild mucosal thickening in the paranasal sinuses. Partial opacification of the right mastoid air cells. IMPRESSION: 1.  Acute cortical infarct involving the majority of the left MCA distribution, sparing the upper perirolandic cortex and the superficial left temporal lobe. There is patchy petechial hemorrhage. 2. 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 Extensive chronic small vessel ischemia. Electronically Signed   By: Monte Fantasia M.D.   On: 09/12/2019 07:06   [facility]  Result Date: 09/12/2019 INDICATION: New onset of left gaze deviation, aphasia, and right-sided weakness. Occluded left middle cerebral artery proximally on CT angiogram of the head and neck. EXAM: 1. EMERGENT LARGE VESSEL OCCLUSION THROMBOLYSIS (anterior CIRCULATION) COMPARISON:  CT angiogram of the head and neck of September 11, 2019. MEDICATIONS: Vancomycin 1 g IV antibiotic was administered within 1 hour of the procedure. ANESTHESIA/SEDATION: General anesthesia. CONTRAST:  Isovue 300 approximately 60 mL. FLUOROSCOPY TIME:  Fluoroscopy Time: 36 minutes 0 seconds (1498 mGy). COMPLICATIONS: None immediate. TECHNIQUE: Following a full explanation of the procedure along with the potential associated complications, an informed witnessed consent was obtained from the spouse and patient's daughter. The risks of intracranial hemorrhage of 10%, worsening neurological deficit, ventilator dependency, death and inability to revascularize were all reviewed in detail with the patient's spouse and daughter. The patient was then put under general anesthesia by the Department of Anesthesiology at [facility]. The right groin was prepped and draped in the usual sterile fashion. Thereafter using modified Seldinger technique, transfemoral access into the right common femoral artery was obtained without difficulty. Over a 0.035 inch guidewire a 5 French Pinnacle sheath was inserted. Through this, and also over a 0.035 inch guidewire a 5 [place] JB 1 catheter was advanced to the aortic arch region and selectively positioned in the left common carotid artery.  FINDINGS: The left common carotid arteriogram demonstrates the left external carotid artery and its major branches to be widely patent. The left internal carotid artery at the bulb to the cranial skull base demonstrates wide patency. The petrous, cavernous and the supraclinoid segments are widely patent. A small infundibulum is seen at the origin of the left posterior communicating artery. The left anterior cerebral artery opacifies into the capillary and venous phases. The left middle cerebral artery just distal to the anterior temporal branch demonstrates complete occlusion. PROCEDURE: The diagnostic JB 1 catheter in the left common carotid artery was then exchanged over a 0.035 300 cm Rosen exchange guidewire for an 8 [place] Pinnacle sheath which was then connected to continuous heparinized saline infusion. Over the [facility] exchange guidewire, a 95 cm 087 Qapel balloon guide catheter which had been prepped with 50% contrast and 50% heparinized saline infusion was advanced and positioned just proximal to the left common carotid bifurcation. The guidewire was removed. Good aspiration obtained from the hub of the balloon guide catheter. A gentle control arteriogram performed through this demonstrated no change in the intracranial or  the extracranial circulation. Over a 0.014 inch Softip Synchro micro guidewire, a combination of an 021 Trevo ProVue microcatheter inside of a 6 French 132 cm Catalyst guide catheter was advanced without difficulty to the supraclinoid left ICA. With the micro guidewire leading with a J-tip configuration to avoid dissections or inducing spasm, the combination was navigated to the proximal left middle cerebral artery. Using a torque device the micro guidewire was then advanced to the distal M2 M3 region of the inferior division followed by the microcatheter. The guidewire was removed. Good aspiration was obtained from the hub of the microcatheter. Gentle control arteriogram performed through  the microcatheter demonstrated safe position of tip of the microcatheter. This was then connected to continuous heparinized saline infusion. A 5 mm x 33 mm Embotrap retrieval device was then advanced to the distal end of the microcatheter. With the O ring on the delivery microcatheter loosened, and slight forward traction with the right hand on the delivery micro guidewire, the distal and then the proximal portion of the device was then deployed. At this time the 6 [place] Catalyst guide catheter was advanced in order to engage the proximal portion of the clot. With proximal flow arrest in the right internal carotid artery with balloon catheter having been advanced into the proximal 1/3, and constant aspiration being applied with a 60 mL syringe at the hub of the balloon guide catheter and aspiration applied with a Penumbra aspiration device at the hub of the 6 [place] Catalyst guide catheter for approximately 2-1/2 minutes, the combination of the retrieval device, the microcatheter and the 6 [place] Catalyst guide catheter was retrieved and removed. Aspiration was continued whilst the balloon was deflated in the left internal carotid artery. A few specks of clot were seen entangled in the cells of the retrieval device. A control arteriogram performed through the balloon guide catheter in the proximal left internal carotid artery demonstrated slight proximal migration of the clot compared to previously. A second pass was then made again using the above combination. After having ascertained safe position of tip of the microcatheter in the M2 M3 region of the inferior division, and having verified the safe tip of the microcatheter, a 4 mm x 40 mm Solitaire X retrieval device was advanced to the distal end of the microcatheter. The O ring on the delivery microcatheter was then loosened. With slight forward gentle traction with the right hand on the delivery micro guidewire, with the left hand the delivery microcatheter  was retrieved unsheathing the retrieval device. The 6 [place] Catalyst guide catheter was now advanced further into the clot of the left middle cerebral artery. Proximal flow arrest was then initiated by inflating the balloon guide catheter in the mid 1/3 of the left internal carotid artery. Constant aspiration with a Penumbra aspiration device was then initiated at the hub of the Catalyst guide catheter for approximately 2 minutes. Following this, the combination of the retrieval device, the microcatheter and the 6 [place] Catalyst guide catheter were then retrieved and removed as constant aspiration was applied at the hub of the balloon guide catheter with deflation of the balloon in the left internal carotid artery. Free back bleed of blood was seen at the hub of the balloon guide catheter. A control arteriogram performed through the balloon guide catheter in the left internal carotid artery demonstrated complete revascularization of the left middle cerebral artery achieving a TICI 3 revascularization. The left anterior cerebral artery remained widely patent as well. Wide patency of the left  internal carotid artery extra cranially was maintained with mild spasm at the distal 1/3 of the left internal carotid artery. The balloon guide catheter was retrieved and removed. The 8 French Pinnacle sheath in the right groin was then removed with the successful application of an 8 [place] Angio-Seal closure device. The right groin appeared soft without evidence of a hematoma or bleeding. Distal pulses in the dorsalis pedis, and the posterior tibial regions remained palpable unchanged. A flat panel CT of the brain revealed no evidence of an intracranial hemorrhage, or mass effect or midline shift. The patient's general anesthesia was then reversed and the patient extubated. Upon recovery, the patient was able to move her left arm and leg, and bend her right knee spontaneously and to command. She was then transferred to the  PACU and then neuro ICU for post thrombectomy management. IMPRESSION: Status post endovascular complete revascularization of occluded left middle cerebral artery M1 segment with 1 pass with the Embotrap 5 mm x 33 mm retrieval device, and 1 pass with the Solitaire 4 mm x 40 mm X retrieval device, and adjunct Penumbra aspiration achieving a TICI 3 revascularization. PLAN: Follow-up in clinic 4 weeks post discharge. Electronically Signed   By: Luanne Bras M.D.   On: 09/11/2019 11:53   Ct Code Stroke Cta Cerebral Perfusion W/wo Contrast  Result Date: 09/11/2019 CLINICAL DATA:  Stroke symptoms EXAM: CT ANGIOGRAPHY HEAD AND NECK CT PERFUSION BRAIN TECHNIQUE: Multidetector CT imaging of the head and neck was performed using the standard protocol during bolus administration of intravenous contrast. Multiplanar CT image reconstructions and MIPs were obtained to evaluate the vascular anatomy. Carotid stenosis measurements (when applicable) are obtained utilizing NASCET criteria, using the distal internal carotid diameter as the denominator. Multiphase CT imaging of the brain was performed following IV bolus contrast injection. Subsequent parametric perfusion maps were calculated using RAPID software. CONTRAST:  122mL OMNIPAQUE IOHEXOL 350 MG/ML SOLN COMPARISON:  Head CT from earlier today. FINDINGS: Delayed study relative to the noncontrast CT due to difficulty with IV access per report. CTA NECK FINDINGS Aortic arch: Atherosclerotic calcification.  Three vessel branching. Right carotid system: Limited atheromatous changes. No stenosis or ulceration. Left carotid system: Limited atheromatous changes for age. No stenosis or ulceration. Vertebral arteries: Proximal subclavian atherosclerosis is mild. Widely patent vertebral arteries with smooth luminal contour. Skeleton: No acute finding.  Cervical spine degeneration. Other neck: No significant incidental finding Upper chest: Negative Review of the MIP images  confirms the above findings CTA HEAD FINDINGS Anterior circulation: Central branching filling defect at the left distal M1 and proximal M2 segments with underfilling of downstream vessels. No right-sided embolism or branch occlusion is seen. Hypoplastic left A1 segment. Atherosclerotic plaque on both carotid siphons mild to moderate narrowing on the right. Posterior circulation: Vertebrobasilar arteries are smooth and widely patent. No branch occlusion or beading. Negative for aneurysm Venous sinuses: Unremarkable in the arterial phase Anatomic variants: As above Review of the MIP images confirms the above findings CT Brain Perfusion Findings: ASPECTS: Not scored given the extent of chronic changes. No acute infarct is detected. CBF (<30%) Volume: 61mL-some of this mapping may be overestimated as there is likely some bleeding into the chronic infarct the lateral frontal lobe. Perfusion (Tmax>6.0s) volume: 34mL Mismatch Volume: 56 with mismatched ratio of 2.11mL Infarction Location:Left posterior frontal These results were communicated to Dr. Lorraine Lax at [facility] 10/28/2020by text page via the [facility] messaging system. Embolectomy procedure has already been ordered. IMPRESSION: 1. Left distal M1 embolism  extending into proximal M2 branches with poorly enhancing downstream vessels. There has been a remote moderate left lateral frontal and upper insular infarct; CT perfusion maps show 38 cc of completed acute infarct and 56 cc of superimposed penumbra. 2. Limited atherosclerosis for age. No underlying flow limiting stenosis or embolic source identified. Electronically Signed   By: Monte Fantasia M.D.   On: 09/11/2019 07:27   Dg Chest Port 1 View  Result Date: 09/17/2019 CLINICAL DATA:  78 year old female with shortness of breath. Feeding tube. EXAM: PORTABLE CHEST 1 VIEW COMPARISON:  09/16/2019 portable chest and earlier. FINDINGS: Portable AP semi upright view at 1108 hours. Stable somewhat low lung volumes. Allowing  for portable technique the lungs are clear. Mediastinal contours remain within normal limits. Left medial chest cardiac event recorder again noted. Enteric feeding tube courses into the abdomen, and a portion of this appears to be visible across midline near the cholecystectomy clips. Negative visible bowel gas pattern. IMPRESSION: 1.  No acute cardiopulmonary abnormality. 2. Enteric feeding tube courses to the abdomen and probably is post pyloric, incompletely visible. Electronically Signed   By: Genevie Ann M.D.   On: 09/17/2019 11:21   Dg Chest Port 1 View  Result Date: 09/16/2019 CLINICAL DATA:  Stroke, pulmonary fibrosis, history hypertension, CHF, chronic kidney disease EXAM: PORTABLE CHEST 1 VIEW COMPARISON:  Portable exam 1754 hours compared to 10/29/2016 FINDINGS: Feeding tube extends into stomach. Ovoid device projects over heart question loop recorder. Enlargement of cardiac silhouette with slight vascular congestion. Mediastinal contours normal. Lungs clear. No pleural effusion or pneumothorax. IMPRESSION: Enlargement of cardiac silhouette with vascular congestion. No acute infiltrates. Electronically Signed   By: Lavonia Dana M.D.   On: 09/16/2019 18:21   Ir Percutaneous Art Thrombectomy/infusion Intracranial Inc Diag Angio  Result Date: 09/12/2019 INDICATION: New onset of left gaze deviation, aphasia, and right-sided weakness. Occluded left middle cerebral artery proximally on CT angiogram of the head and neck. EXAM: 1. EMERGENT LARGE VESSEL OCCLUSION THROMBOLYSIS (anterior CIRCULATION) COMPARISON:  CT angiogram of the head and neck of September 11, 2019. MEDICATIONS: Vancomycin 1 g IV antibiotic was administered within 1 hour of the procedure. ANESTHESIA/SEDATION: General anesthesia. CONTRAST:  Isovue 300 approximately 60 mL. FLUOROSCOPY TIME:  Fluoroscopy Time: 36 minutes 0 seconds (1498 mGy). COMPLICATIONS: None immediate. TECHNIQUE: Following a full explanation of the procedure along with the  potential associated complications, an informed witnessed consent was obtained from the spouse and patient's daughter. The risks of intracranial hemorrhage of 10%, worsening neurological deficit, ventilator dependency, death and inability to revascularize were all reviewed in detail with the patient's spouse and daughter. The patient was then put under general anesthesia by the Department of Anesthesiology at [facility]. The right groin was prepped and draped in the usual sterile fashion. Thereafter using modified Seldinger technique, transfemoral access into the right common femoral artery was obtained without difficulty. Over a 0.035 inch guidewire a 5 French Pinnacle sheath was inserted. Through this, and also over a 0.035 inch guidewire a 5 [place] JB 1 catheter was advanced to the aortic arch region and selectively positioned in the left common carotid artery. FINDINGS: The left common carotid arteriogram demonstrates the left external carotid artery and its major branches to be widely patent. The left internal carotid artery at the bulb to the cranial skull base demonstrates wide patency. The petrous, cavernous and the supraclinoid segments are widely patent. A small infundibulum is seen at the origin of the left posterior communicating artery. The left  anterior cerebral artery opacifies into the capillary and venous phases. The left middle cerebral artery just distal to the anterior temporal branch demonstrates complete occlusion. PROCEDURE: The diagnostic JB 1 catheter in the left common carotid artery was then exchanged over a 0.035 300 cm Rosen exchange guidewire for an 8 [place] Pinnacle sheath which was then connected to continuous heparinized saline infusion. Over the [facility] exchange guidewire, a 95 cm 087 [place] balloon guide catheter which had been prepped with 50% contrast and 50% heparinized saline infusion was advanced and positioned just proximal to the left common carotid bifurcation. The  guidewire was removed. Good aspiration obtained from the hub of the balloon guide catheter. A gentle control arteriogram performed through this demonstrated no change in the intracranial or the extracranial circulation. Over a 0.014 inch Softip Synchro micro guidewire, a combination of an 021 Trevo ProVue microcatheter inside of a 6 French 132 cm Catalyst guide catheter was advanced without difficulty to the supraclinoid left ICA. With the micro guidewire leading with a J-tip configuration to avoid dissections or inducing spasm, the combination was navigated to the proximal left middle cerebral artery. Using a torque device the micro guidewire was then advanced to the distal M2 M3 region of the inferior division followed by the microcatheter. The guidewire was removed. Good aspiration was obtained from the hub of the microcatheter. Gentle control arteriogram performed through the microcatheter demonstrated safe position of tip of the microcatheter. This was then connected to continuous heparinized saline infusion. A 5 mm x 33 mm Embotrap retrieval device was then advanced to the distal end of the microcatheter. With the O ring on the delivery microcatheter loosened, and slight forward traction with the right hand on the delivery micro guidewire, the distal and then the proximal portion of the device was then deployed. At this time the 6 [place] Catalyst guide catheter was advanced in order to engage the proximal portion of the clot. With proximal flow arrest in the right internal carotid artery with balloon catheter having been advanced into the proximal 1/3, and constant aspiration being applied with a 60 mL syringe at the hub of the balloon guide catheter and aspiration applied with a Penumbra aspiration device at the hub of the 6 [place] Catalyst guide catheter for approximately 2-1/2 minutes, the combination of the retrieval device, the microcatheter and the 6 [place] Catalyst guide catheter was retrieved and  removed. Aspiration was continued whilst the balloon was deflated in the left internal carotid artery. A few specks of clot were seen entangled in the cells of the retrieval device. A control arteriogram performed through the balloon guide catheter in the proximal left internal carotid artery demonstrated slight proximal migration of the clot compared to previously. A second pass was then made again using the above combination. After having ascertained safe position of tip of the microcatheter in the M2 M3 region of the inferior division, and having verified the safe tip of the microcatheter, a 4 mm x 40 mm Solitaire X retrieval device was advanced to the distal end of the microcatheter. The O ring on the delivery microcatheter was then loosened. With slight forward gentle traction with the right hand on the delivery micro guidewire, with the left hand the delivery microcatheter was retrieved unsheathing the retrieval device. The 6 [place] Catalyst guide catheter was now advanced further into the clot of the left middle cerebral artery. Proximal flow arrest was then initiated by inflating the balloon guide catheter in the mid 1/3 of the left internal  carotid artery. Constant aspiration with a Penumbra aspiration device was then initiated at the hub of the Catalyst guide catheter for approximately 2 minutes. Following this, the combination of the retrieval device, the microcatheter and the 6 [place] Catalyst guide catheter were then retrieved and removed as constant aspiration was applied at the hub of the balloon guide catheter with deflation of the balloon in the left internal carotid artery. Free back bleed of blood was seen at the hub of the balloon guide catheter. A control arteriogram performed through the balloon guide catheter in the left internal carotid artery demonstrated complete revascularization of the left middle cerebral artery achieving a TICI 3 revascularization. The left anterior cerebral artery  remained widely patent as well. Wide patency of the left internal carotid artery extra cranially was maintained with mild spasm at the distal 1/3 of the left internal carotid artery. The balloon guide catheter was retrieved and removed. The 8 French Pinnacle sheath in the right groin was then removed with the successful application of an 8 [place] Angio-Seal closure device. The right groin appeared soft without evidence of a hematoma or bleeding. Distal pulses in the dorsalis pedis, and the posterior tibial regions remained palpable unchanged. A flat panel CT of the brain revealed no evidence of an intracranial hemorrhage, or mass effect or midline shift. The patient's general anesthesia was then reversed and the patient extubated. Upon recovery, the patient was able to move her left arm and leg, and bend her right knee spontaneously and to command. She was then transferred to the PACU and then neuro ICU for post thrombectomy management. IMPRESSION: Status post endovascular complete revascularization of occluded left middle cerebral artery M1 segment with 1 pass with the Embotrap 5 mm x 33 mm retrieval device, and 1 pass with the Solitaire 4 mm x 40 mm X retrieval device, and adjunct Penumbra aspiration achieving a TICI 3 revascularization. PLAN: Follow-up in clinic 4 weeks post discharge. Electronically Signed   By: Luanne Bras M.D.   On: 09/11/2019 11:53   Ct Head Code Stroke Wo Contrast  Result Date: 09/11/2019 CLINICAL DATA:  Code stroke.  Generalized muscle weakness EXAM: CT HEAD WITHOUT CONTRAST TECHNIQUE: Contiguous axial images were obtained from the base of the skull through the vertex without intravenous contrast. COMPARISON:  Brain MRI 09/08/2017 FINDINGS: Brain: No evidence of acute infarction, hemorrhage, hydrocephalus, extra-axial collection or mass lesion/mass effect. Moderate remote left MCA branch infarct affecting the lateral frontal lobe. Moderate remote right occipital infarct.  Extensive chronic small vessel ischemic gliosis in the cerebral white matter. Small remote right cerebellar infarct Vascular: Hyperdense left distal M1 segment extending into the M2 branches. Skull: Normal. Negative for fracture or focal lesion. Sinuses/Orbits: No acute finding. Other: These results were communicated to Dr. Lorraine Lax at 6:34 amon 10/28/2020by text page via the [facility] messaging system. ASPECTS [facility] Stroke Program Early CT Score) Not scored given the extent of chronic change. IMPRESSION: 1. Hyperdense distal left M1 segment suggesting acute thrombosis. No hemorrhage or visible acute infarct. There has been a remote moderate left MCA branch infarct at the lateral frontal lobe. 2. Advanced chronic small vessel ischemia in the cerebral white matter. There is also been remote right occipital infarct. Electronically Signed   By: Monte Fantasia M.D.   On: 09/11/2019 06:36   Vas [place] Lower Extremity Venous (dvt)  Result Date: 09/14/2019  Lower Venous Study Indications: Stroke.  Limitations: Confusion. Comparison Study: No prior study on file for comparison Performing Technologist: Sharion Dove RVS  Examination Guidelines: A complete evaluation includes B-mode imaging, spectral Doppler, color Doppler, and power Doppler as needed of all accessible portions of each vessel. Bilateral testing is considered an integral part of a complete examination. Limited examinations for reoccurring indications may be performed as noted.  +---------+---------------+---------+-----------+----------+--------------+ RIGHT    CompressibilityPhasicitySpontaneityPropertiesThrombus Aging +---------+---------------+---------+-----------+----------+--------------+ CFV      Full           Yes      Yes                                 +---------+---------------+---------+-----------+----------+--------------+ SFJ      Full                                                         +---------+---------------+---------+-----------+----------+--------------+ FV Prox  Full                                                        +---------+---------------+---------+-----------+----------+--------------+ FV Mid   Full                                                        +---------+---------------+---------+-----------+----------+--------------+ FV DistalFull                                                        +---------+---------------+---------+-----------+----------+--------------+ PFV      Full                                                        +---------+---------------+---------+-----------+----------+--------------+ POP      Full           Yes      Yes                                 +---------+---------------+---------+-----------+----------+--------------+ PTV      Full                                                        +---------+---------------+---------+-----------+----------+--------------+ PERO     Full                                                        +---------+---------------+---------+-----------+----------+--------------+   +---------+---------------+---------+-----------+----------+-------------------+  LEFT     CompressibilityPhasicitySpontaneityPropertiesThrombus Aging      +---------+---------------+---------+-----------+----------+-------------------+ CFV      Full           Yes      Yes                                      +---------+---------------+---------+-----------+----------+-------------------+ SFJ      Full                                                             +---------+---------------+---------+-----------+----------+-------------------+ FV Prox  Full                                                             +---------+---------------+---------+-----------+----------+-------------------+ FV Mid   Full                                                              +---------+---------------+---------+-----------+----------+-------------------+ FV DistalFull                                                             +---------+---------------+---------+-----------+----------+-------------------+ PFV      Full                                                             +---------+---------------+---------+-----------+----------+-------------------+ POP                     Yes      Yes                  patent by color and                                                       Doppler             +---------+---------------+---------+-----------+----------+-------------------+ PTV      Full                                                             +---------+---------------+---------+-----------+----------+-------------------+ PERO     Full                                                             +---------+---------------+---------+-----------+----------+-------------------+  Summary: Right: There is no evidence of deep vein thrombosis in the lower extremity. Left: There is no evidence of deep vein thrombosis in the lower extremity.  *See table(s) above for measurements and observations. Electronically signed by Deitra Mayo MD on 09/14/2019 at 5:56:00 PM.    Final     Assessment: 78 y.o. female PMH of chronic diastolic heart failure,prior left MCA stroke, hypertension, hyperlipidemia was recently admitted to [facility] on 09/11/19 for left MCA infarct and left M1 occlusion s/p thrombectomy with residue global aphasia and dysphagia. In CIR today had GTC and minimally responsive post clinical seizure. 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 Received 4mg  ativan total. And will load with keppra. Stat CT to rule out bleeding. Will have stat EEG after CT. If CT shows bleeding or EEG shows status epilepticus, will recommend transfer pt back to acute hospital for management.   Plan: - ativan 2mg  x 2 - load with keppra 2g followed by  keppra 500mg  bid - stat CT head - stat EEG - If CT shows bleeding or EEG shows status epilepticus, will recommend transfer pt back to acute hospital for management.  - will follow - case discussed with rehab PA Quillian Quince  Thank you for this consultation and allowing [place] to participate in the care of this patient.  Rosalin Hawking, MD PhD Stroke Neurology 09/18/2019 3:51 PM

## 2019-09-18 NOTE — Evaluation (Signed)
Occupational Therapy Assessment and Plan  Patient Details  Name: Kenlyn Lose MRN: [id number] Date of Birth: 03-13-1941  OT Diagnosis: abnormal posture, apraxia, cognitive deficits, hemiplegia affecting dominant side, muscle weakness (generalized) and coordination disorder Rehab Potential: Rehab Potential (ACUTE ONLY): Fair ELOS: 18-21 days   Today's Date: 09/18/2019 OT Individual Time: 0901-1000 and 1130-1203 OT Individual Time Calculation (min): 59 min   And 33 mins  Problem List:  Patient Active Problem List   Diagnosis Date Noted  . 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 Hx of completed stroke 09/16/2019  . Dysphagia following cerebrovascular accident (CVA) 09/16/2019  . Malnutrition ([place]) 09/16/2019  . Acute blood loss anemia 09/16/2019  . Thrombocytopenia ([place]) 09/16/2019  . Acute ischemic left MCA stroke ([facility]) s/p IR, embolic source unknown 32/35/5732  . 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 Bilateral lower extremity edema 07/18/2019  . Weakness of left leg   . Urinary incontinence   . Tumor of ovary   . Stroke ([place])   . Sinus drainage   . Shortness of breath   . Pneumonia   . Hypothyroidism   . Hypertension   . Hypercholesteremia   . Headache   . GERD (gastroesophageal reflux disease)   . Complication of anesthesia   . CKD (chronic kidney disease), stage III   . Arthritis   . 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 Abdominal distention   . Coronary artery calcification seen on CT scan 01/10/2017  . Hypertensive heart disease with heart failure ([place]) 01/10/2017  . Abnormality of gait 10/28/2015  . 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 Chronic diastolic heart failure ([place]) 11/19/2013  . Benign essential HTN 11/19/2013  . OSA (obstructive sleep apnea) 11/19/2013  . CAP (community acquired pneumonia) 09/07/2013  . Leukocytosis 09/04/2013  . Fever of unknown origin 09/04/2013  .  Hypoxia 09/04/2013  . SOB (shortness of breath) 09/04/2013  . SIRS (systemic inflammatory response syndrome) ([place]) 09/04/2013  . Mass of ovary 10/26/2011    Past Medical History:  Past Medical History:  Diagnosis Date  . Abdominal distention    past two months  . Abnormality of gait 10/28/2015  . 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 Arthritis    right knee  . Benign essential HTN 11/19/2013  . CAP (community acquired pneumonia) 09/07/2013  . CHF (congestive heart failure) ([place])   . Chronic diastolic heart failure ([place]) 11/19/2013  . Chronic kidney disease    STAGE III KIDNEY DISEASE-PER PT--PT SEES DR. Risa Grill UROLOGIST  . Complication of anesthesia    PT REMEMBERS BREATHING PROBLEMS WAKING UP FROM KNEE SURGERY AT [facility] 2011 OR 2012  . Coronary artery calcification seen on CT scan 01/10/2017  . Fever of unknown origin 09/04/2013  . GERD (gastroesophageal reflux disease)   . Headache   . Headache(784.0)   . 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 Hypercholesteremia   . Hypertension   . Hypertensive heart disease with heart failure ([place]) 01/10/2017  . Hypothyroidism   . Hypoxia 09/04/2013  . Leukocytosis 09/04/2013  . Mass of ovary 10/26/2011  . OSA (obstructive sleep apnea) 11/19/2013  . Pneumonia   . Shortness of breath    WITH EXERTION  . Sinus drainage    PT STARTED ANTIBIOTIC 11/17/11 --IS HOARSE TODAY, SOME WHEEZING AND COUGH WITH YELLOW  DRAINAGE  . SIRS (systemic inflammatory response syndrome) ([place]) 09/04/2013  . SOB (shortness of breath) 09/04/2013  . Stroke ([place]) 02/2007, 08/2009   2 total -RESIDUAL WEAKNESS LEFT LEG--AND APHASIA  . Stroke ([place]) 09/01/2017  . Stroke ([place]) 09/08/2017  . Tumor of ovary   . Urinary incontinence   . Urinary incontinence   . Weakness of left leg    Mildly, post stroke   Past Surgical History:  Past Surgical History:  Procedure Laterality  Date  . ABDOMINAL HYSTERECTOMY    . CHOLECYSTECTOMY    . IR CT HEAD LTD  09/11/2019  . IR PERCUTANEOUS ART THROMBECTOMY/INFUSION INTRACRANIAL INC DIAG ANGIO  09/11/2019  . LAPAROTOMY  11/22/2011   Procedure: EXPLORATORY LAPAROTOMY;  Surgeon: Imagene Gurney A. Alycia Rossetti, MD;  Location: [facility];  Service: Gynecology;  Laterality: N/A;  . LOOP RECORDER INSERTION N/A 09/16/2019   Procedure: LOOP RECORDER INSERTION;  Surgeon: Evans Lance, MD;  Location: [facility];  Service: Cardiovascular;  Laterality: N/A;  . MENISCUS REPAIR    . RADIOLOGY WITH ANESTHESIA N/A 09/11/2019   Procedure: IR WITH ANESTHESIA;  Surgeon: Radiologist, Medication, MD;  Location: [facility];  Service: Radiology;  Laterality: N/A;  . SALPINGOOPHORECTOMY  11/22/2011   Procedure: SALPINGO OOPHERECTOMY;  Surgeon: Imagene Gurney A. Alycia Rossetti, MD;  Location: [facility];  Service: Gynecology;  Laterality: Bilateral;  . TOE SURGERY    . TONSILLECTOMY    . TUBAL LIGATION      Assessment & Plan Clinical Impression: Patient is a 78 y.o. 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 Per chart review patient lives with spouse. Two-level home with bed and bath on main level. Independent with assistive device. Patient prefers to take sponge baths. Presented 09/11/2019 nonverbal with right side weakness. SARS Covid negative, creatinine 1.25, hemoglobin 11.8, WBC 10,700. Cranial CT scan showed no hemorrhage or visible acute infarction. Patient did not receive TPA. CT angiogram of head and neck showed left distal M1 embolism extending into proximal M2 branches with poorly enhancing downstream vessels. Remote moderate left lateral frontal and upper insular infarction. CT perfusion showed a 38 cc acute infarct and 56 cc of superimposed penumbra. Underwent cerebral angiogram with  complete revascularization of occluded Lt LMCA M1 segment. MRI follow-up showed acute cortical infarct involving majority of left MCA distribution. There was patchy petechial hemorrhage. Echocardiogram with ejection fraction of 65%. Lower extremity Dopplers negative for DVT. Neurology follow-up currently on aspirin 325 mg daily as well as Plavix. Loop recorder has been placed. Patient remains on Lovenox for DVT prophylaxis. Patient is currently n.p.o. with nasogastric tube feeds. Patient has had episodes of sundowning maintained on Aricept as well as Seroquel. Patient with episode of shortness of breath respiratory 26 oxygen saturation 97% 09/17/2019 there was some copious secretions.chest x-ray completed showing no active disease patient did receive an extra dose of Lasix and troponin was negative. Therapy evaluations completed and patient was admitted for a comprehensive rehab program  Pt has mittens that were placed today, per NT because she pulled out her NGT last night requiring a Coretrak, and kept attempting to pull out IVs.  Her husband was at bedside and said she's been listening to him "some". Shook her head no when asked about pain, but 90% of time shook her head no to any question .  Patient transferred to CIR on  09/17/2019 .    Patient currently requires mod with basic self-care skills secondary to muscle weakness, decreased cardiorespiratoy endurance, motor apraxia, decreased coordination and decreased motor planning, visual deficits - unable to formally assess, decreased awareness, decreased problem solving, decreased safety awareness and delayed processing, safety awareness and decreased sitting balance, decreased standing balance, hemiplegia and decreased balance strategies.  Prior to hospitalization, patient could complete ADLs and IADLs with modified independent .  Patient will benefit from skilled intervention to decrease level of assist with basic self-care skills prior to discharge home  with care partner.  Anticipate patient will require 24 hour supervision and follow up home health.  OT - End of Session Activity Tolerance: Decreased this session Endurance Deficit: Yes Endurance Deficit Description: muliple rest breaks secondary to fatigue OT Assessment Rehab Potential (ACUTE ONLY): Fair OT Barriers to Discharge: (none known at this time) OT Patient demonstrates impairments in the following area(s): Balance;Cognition;Endurance;Pain;Safety;Motor;Vision;Perception OT Basic ADL's Functional Problem(s): Grooming;Bathing;Dressing;Toileting OT Transfers Functional Problem(s): Toilet;Tub/Shower OT Additional Impairment(s): Fuctional Use of Upper Extremity OT Plan OT Intensity: Minimum of 1-2 x/day, 45 to 90 minutes OT Frequency: 5 out of 7 days OT Duration/Estimated Length of Stay: 18-21 days OT Treatment/Interventions: Balance/vestibular training;Self Care/advanced ADL retraining;Therapeutic Exercise;Cognitive remediation/compensation;DME/adaptive equipment instruction;Pain management;Skin care/wound managment;Neuromuscular re-education;Wheelchair propulsion/positioning;UE/LE Strength taining/ROM;Community reintegration;Functional electrical stimulation;Patient/family education;UE/LE Coordination activities;Discharge planning;Functional mobility training;Psychosocial support;Therapeutic Activities OT Self Feeding Anticipated Outcome(s): supervision OT Basic Self-Care Anticipated Outcome(s): supervision OT Toileting Anticipated Outcome(s): supervision OT Bathroom Transfers Anticipated Outcome(s): supervision OT Recommendation Recommendations for Other Services: (none at this time) Follow Up Recommendations: Home health OT;24 hour supervision/assistance Equipment Recommended: None recommended by OT   Skilled Therapeutic Intervention Session 1: Upon entering the room, pt supine in bed with no c/o, signs, or symptoms of pain. Pt performed supine >sit with mod A to EOB. Static  sitting balance with supervision on EOB and mod lifting assistance to stand. Pt with posterior bias in standing and transferred onto [facility]. Pt was able to void and have BM this session. While seated on commode, self care continued but pt needing hand over hand assistance to initiate and sequence washing several parts. Pt standing with min A balance while therapist performed hygiene and LB clothing management. Pt ambulating 10' to recliner chair with min A and mod cuing for technique. Pt needing cues for forward head as she had posterior bias and flexed posture at times without being corrected. Pt seated in recliner chair with chair alarm activated and soft call bell. B hand mitts donned.   Session 2: Upon entering the room, pt seated in recliner chair with husband present in the room. Pt with no c/o pain this session. OT providing total A to don B TED hose. Sit >stand with mod A from recliner chair and pt ambulating with RW back to bed in order to rest with min A. Pt required mod A for sit >supine for B LEs. OT gave pt suction toothbrush but she just put it in mouth and tried to "drink/suck" oral rinse. OT provided total A for oral hygiene this session. OT educated caregiver on pt performance, LOS, goals, and need for 24/7 at discharge. Caregiver verbalized understanding. B hand mitts donned and soft touch call bell within reach. Bed alarm activated.     OT Evaluation Precautions/Restrictions  Precautions Precautions: Fall Precaution Comments: cortrak Restrictions Weight Bearing Restrictions: No Pain Pain Assessment Pain Scale: Faces Faces Pain Scale: No hurt Home Living/Prior Functioning Home Living Family/patient expects to be discharged to:: Private residence  Living Arrangements: Spouse/significant other Available Help at Discharge: Family, Available 24 hours/day, Other (Comment) Type of Home: House Home Access: Level entry Home Layout: Two level, Able to live on main level with  bedroom/bathroom Alternate Level Stairs-Number of Steps: flight Alternate Level Stairs-Rails: Right, Left Bathroom Shower/Tub: Multimedia programmer: Handicapped height Bathroom Accessibility: Yes Additional Comments: information obtained from chart when daughter was present  Lives With: Spouse Prior Function Level of Independence: Requires assistive device for independence Comments: pt takes more sponge baths than showers, but has been able to shower alone, go to bathroom without assist, brush teeth and donn clothes independently ADL   Vision Baseline Vision/History: Wears glasses Wears Glasses: At all times Patient Visual Report: No change from baseline Vision Assessment?: Vision impaired- to be further tested in functional context Additional Comments: R inattention and  undershooting depth perception. Unable to formally assess on evaluation. Will assess further in tx sessions Cognition Arousal/Alertness: Awake/alert Orientation Level: Nonverbal/unable to assess Year: (aphasia) Month: (aphasia) Day of Week: (aphasia) Memory: (aphasia) Immediate Memory Recall: (aphasia) Sensation Sensation Light Touch: Appears Intact Hot/Cold: Appears Intact Coordination Gross Motor Movements are Fluid and Coordinated: No Fine Motor Movements are Fluid and Coordinated: No Coordination and Movement Description: apraxic and uncoordinated movements Motor  Motor Motor: Motor apraxia Motor - Skilled Clinical Observations: R hemiparesis Mobility  Bed Mobility Bed Mobility: Rolling Right;Rolling Left;Supine to Sit;Sit to Supine Rolling Right: Moderate Assistance - Patient 50-74% Rolling Left: Moderate Assistance - Patient 50-74% Supine to Sit: Moderate Assistance - Patient 50-74% Sit to Supine: Moderate Assistance - Patient 50-74%  Trunk/Postural Assessment  Cervical Assessment Cervical Assessment: Exceptions to WFL(forward head) Thoracic Assessment Thoracic Assessment: Exceptions  to WFL(kyphotic) Lumbar Assessment Lumbar Assessment: Exceptions to WFL(posterior pelvic tilt)  Balance Balance Balance Assessed: Yes Dynamic Sitting Balance Dynamic Sitting - Balance Support: During functional activity Dynamic Sitting - Level of Assistance: 5: Stand by assistance Dynamic Sitting - Balance Activities: Reaching for objects Static Standing Balance Static Standing - Balance Support: During functional activity Static Standing - Level of Assistance: 4: Min assist Dynamic Standing Balance Dynamic Standing - Balance Support: During functional activity Dynamic Standing - Level of Assistance: 4: Min assist;3: Mod assist Extremity/Trunk Assessment RUE Assessment RUE Assessment: Exceptions to [facility] Passive Range of Motion (PROM) Comments: WFLs Active Range of Motion (AROM) Comments: WFLs General Strength Comments: 3-/5 LUE Assessment LUE Assessment: Exceptions to [facility] Passive Range of Motion (PROM) Comments: WFLs Active Range of Motion (AROM) Comments: WFLs General Strength Comments: 3-/5     Refer to Care Plan for Long Term Goals  Recommendations for other services: None    Discharge Criteria: Patient will be discharged from OT if patient refuses treatment 3 consecutive times without medical reason, if treatment goals not met, if there is a change in medical status, if patient makes no progress towards goals or if patient is discharged from hospital.  The above assessment, treatment plan, treatment alternatives and goals were discussed and mutually agreed upon: by patient and by family  Gypsy Decant 09/18/2019, 12:58 PM

## 2019-09-18 NOTE — Procedures (Signed)
Patient Name: April Le  MRN: [id number]  Epilepsy Attending: Lora Havens  Referring Physician/Provider: Dr Rosalin Hawking Date: 09/18/2019 Duration: 24.59 mins  Patient history: 78yo F with seizure. EEG to evaluate for status   Level of alertness: obtunded  AEDs during EEG study: keppra, ativan   Technical aspects: This EEG study was done with scalp electrodes positioned according to the 10-20 International system of electrode placement. Electrical activity was acquired at a sampling rate of 500Hz  and reviewed with a high frequency filter of 70Hz  and a low frequency filter of 1Hz . EEG data were recorded continuously and digitally stored.   DESCRIPTION: EEG showed continuous generalized polymorphic 3-5hz  theta-delta slowing.  Hyperventilation and photic stimulation were not performed.  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 No seizures or epileptiform discharges were seen throughout the recording.

## 2019-09-18 NOTE — Progress Notes (Signed)
Pt resting in bed. Pt still sleepy but arousable with heavy stimulation. RN spoke with neurology and reviewed CT and EEG results. Pt stable at this time. RN called family to give them update on patient status. Vitals stable at this time and O2 sats are good. Continuous pulse ox in place. Will continue to monitor closely.

## 2019-09-18 NOTE — Significant Event (Signed)
Rapid Response Event Note  Overview: Unresponsive   Initial Focused Assessment and Interventions:  Received a call from staff about patient being unresponsive, per staff, patient was working with PT, per PT, all of sudden, patient had seizure like activity, RUE clenching/jerking, RLE extension and generalized shaking of LUE/LLE, coupled with right upper gaze and agonal breathing. When I arrived, patient had snoring respirations, her jaw was clenched shut and perhaps was still seizing, 99% on NRB 15L but not protecting her airway, I initiated a Code Blue for respiratory failure. After no more than 3 minutes, patient opened her eyes, and her jaw was no longer clenched, we cancelled the Code Blue and I requested that Neurology see the patient. Pupils were 3 mm and reactive bilaterally. Patient was not following commands, still had some myoclonus, per Neurology, I administered a total of Ativan 4 mg IV, Keppra 2 g IV, and was taken for STAT CT HEAD after IV was established. CT was completed and patient was brought back to 4W. I weaned the oxygen down to 4L [place] and asked RT to place the patient on CPAP for now while she is sleeping (quite sedated from Ativan/Keppra) but oxygen saturations are > 93% and overall is protecting her airway now. EEG tech entered to do EEG and I left to see another patient in an emergency. Patient's VS remained stable the entire event, saturations remained above >93% and SBP > 140s. HR stable as well.  Plan of Care: -- F/U with results with provider -- Monitor VS and respiratory status -- LOW THRESHOLD for transfer to PCU/SDU for close monitoring given that patient is quite sedated.  -- Hold TF overnight, check blood sugars though, PA aware. -- Aspiration Precautions -- Seizure Precautions -- Bedside Pulse - OX monitoring, keep door open so that staff can see oxygen levels.   Event Summary:  Start Time 1450 End Time 1640  Masaichi Kracht R

## 2019-09-18 NOTE — Progress Notes (Signed)
Patient with witnessed seizure while up with physical therapy.  Patient was returned to her room rapid response called.  Oxygen saturations 99%.  Patient was somnolent but was able to be aroused.  Initial CODE BLUE called later aborted.  Code stroke called spoke with Dr.Xu at patient bedside.  Loaded with 2 mg Ativan as well as Keppra.  Stat order for cranial CT scan as well as EEG.  Patient much more arousable.  Spoke at length with husband on plan of care.  Await results of EEG and CT scan.  If patient is post ictal or any changes on CT scan then will plan possible discharge back to acute care.

## 2019-09-18 NOTE — Progress Notes (Addendum)
[facility] PHYSICAL MEDICINE & REHABILITATION PROGRESS NOTE   Subjective/Complaints:  Pt looking at mitts and shaking her head , non verbal, aphasic but does follow simple commands   ROS- denies CP, SOB, N/V/D  Objective:   Dg Abd 1 View  Result Date: 09/16/2019 CLINICAL DATA:  Ten French enteric feeding tube placed via fluoroscopy using 15 mL of Omnipaque 300. EXAM: ABDOMEN - 1 VIEW COMPARISON:  KUB 10/28/2016 FINDINGS: Dobbhoff feeding tube courses through the stomach and duodenum with tip over the proximal jejunum left of midline in the upper abdomen. Contrast is seen filling the proximal jejunum. Bowel gas pattern otherwise unremarkable. Mild degenerate change of the spine. Stable compression fracture over the upper lumbar spine. IMPRESSION: Nonobstructive bowel gas pattern. Dobbhoff feeding tube with tip over the proximal jejunum in the left mid to upper abdomen. Electronically Signed   By: Marin Olp M.D.   On: 09/16/2019 16:16   Dg Chest Port 1 View  Result Date: 09/17/2019 CLINICAL DATA:  78 year old female with shortness of breath. Feeding tube. EXAM: PORTABLE CHEST 1 VIEW COMPARISON:  09/16/2019 portable chest and earlier. FINDINGS: Portable AP semi upright view at 1108 hours. Stable somewhat low lung volumes. Allowing for portable technique the lungs are clear. Mediastinal contours remain within normal limits. Left medial chest cardiac event recorder again noted. Enteric feeding tube courses into the abdomen, and a portion of this appears to be visible across midline near the cholecystectomy clips. Negative visible bowel gas pattern. IMPRESSION: 1.  No acute cardiopulmonary abnormality. 2. Enteric feeding tube courses to the abdomen and probably is post pyloric, incompletely visible. Electronically Signed   By: Genevie Ann M.D.   On: 09/17/2019 11:21   Dg Chest Port 1 View  Result Date: 09/16/2019 CLINICAL DATA:  Stroke, pulmonary fibrosis, history hypertension, CHF, chronic kidney  disease EXAM: PORTABLE CHEST 1 VIEW COMPARISON:  Portable exam 1754 hours compared to 10/29/2016 FINDINGS: Feeding tube extends into stomach. Ovoid device projects over heart question loop recorder. Enlargement of cardiac silhouette with slight vascular congestion. Mediastinal contours normal. Lungs clear. No pleural effusion or pneumothorax. IMPRESSION: Enlargement of cardiac silhouette with vascular congestion. No acute infiltrates. Electronically Signed   By: Lavonia Dana M.D.   On: 09/16/2019 18:21   Recent Labs    09/17/19 0321 09/18/19 0530  WBC 7.5 7.5  HGB 10.8* 11.2*  HCT 32.4* 33.6*  PLT 171 231   Recent Labs    09/16/19 0335 09/17/19 0321  NA 141 141  K 3.7 3.7  CL 105 106  CO2 27 27  GLUCOSE 113* 163*  BUN 13 15  CREATININE 0.96 0.86  CALCIUM 8.7* 8.5*   No intake or output data in the 24 hours ending 09/18/19 0744   Physical Exam: Vital Signs Blood pressure (!) 168/56, pulse 61, temperature 98.7 F (37.1 C), temperature source Oral, resp. rate 16, height 5' (1.524 m), weight (!) 160 kg, SpO2 100 %.   General: No acute distress Mood and affect are appropriate Heart: Regular rate and rhythm no rubs murmurs or extra sounds Lungs: Clear to auscultation, breathing unlabored, no rales or wheezes Abdomen: Positive bowel sounds, soft nontender to palpation, nondistended Extremities: No clubbing, cyanosis, or edema Skin: No evidence of breakdown, no evidence of rash Neurologic: Cranial nerves II through XII intact, motor strength is 5/5 inLeft and 4/5 RIght  deltoid, bicep, tricep, grip, hip flexor, knee extensors, ankle dorsiflexor and plantar flexor Sensory exam normal sensation to light touch and proprioception in bilateral  upper and lower extremities Cerebellar exam normal finger to nose to finger as well as heel to shin in bilateral upper and lower extremities Musculoskeletal: Full range of motion in all 4 extremities. No joint swelling   Assessment/Plan: 1.  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  Physiatrist is providing close team supervision and 24 hour management of active medical problems listed below.  Physiatrist and rehab team continue to assess barriers to discharge/monitor patient progress toward functional and medical goals  Care Tool:  Bathing              Bathing assist       Upper Body Dressing/Undressing Upper body dressing   What is the patient wearing?: Hospital gown only    Upper body assist Assist Level: Maximal Assistance - Patient 25 - 49%    Lower Body Dressing/Undressing Lower body dressing      What is the patient wearing?: Incontinence brief     Lower body assist Assist for lower body dressing: Dependent - Patient 0%     Toileting Toileting    Toileting assist Assist for toileting: Dependent - Patient 0%     Transfers Chair/bed transfer  Transfers assist  Chair/bed transfer activity did not occur: Safety/medical concerns        Locomotion Ambulation   Ambulation assist              Walk 10 feet activity   Assist           Walk 50 feet activity   Assist           Walk 150 feet activity   Assist           Walk 10 feet on uneven surface  activity   Assist           Wheelchair     Assist               Wheelchair 50 feet with 2 turns activity    Assist            Wheelchair 150 feet activity     Assist          Blood pressure (!) 168/56, pulse 61, temperature 98.7 F (37.1 C), temperature source Oral, resp. rate 16, height 5' (1.524 m), weight (!) 160 kg, SpO2 100 %.  Medical Problem List and Plan: 1.  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  Status post loop recorder placement 2.  Antithrombotics: -DVT/anticoagulation: Lovenox.             -antiplatelet therapy: Aspirin 325 mg daily, Plavix 75 mg daily 3. Pain Management: Tylenol as needed 4. Mood: Aricept 5 mg daily, Seroquel 25 mg nightly             -antipsychotic agents: seroquel QHS for sundowning 5. Neuropsych: This patient is not capable of making decisions on her own behalf. 6. Skin/Wound Care: Routine skin checks 7. Fluids/Electrolytes/Nutrition: Routine in and outs with follow-up chemistries 8.  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  Monitor for any signs of fluid overload.  Lasix 40 mg twice daily Monitor daily weights 10.  Dysphagia.  Nasogastric tube feeds; is NPO- if [place] needs to come  out due to d/c, will need to place PEG.  Follow-up speech therapy 10.  CKD stage III.  Follow-up chemistries 11.  Hypertension.  Lopressor 50 mg twice daily.  Monitor with increased mobility Vitals:   09/17/19 2006 09/18/19 0416  BP: (!) 176/90 (!) 168/56  Pulse: 80 61  Resp:  16  Temp:  98.7 F (37.1 C)  SpO2:  [id number]  systolic hypertension permissive for now  12.  Hypothyroidism.  Synthroid 13.  Hyperlipidemia.  Lipitor 14. Aphasia- global vs receptive aphasia with confusion- will have pt evaluated by SLP- might benefit from Amantadine? 15. Low grade temp- pt had low grade temp of 100.3 yesterday -CXR neg for PNA , WBC normal at 7.5 no recurrence thus far , if elevated again may check urine, likely has atelectasis 16.  Morbid obesity BMI 68 LOS: 1 days A FACE TO FACE EVALUATION WAS PERFORMED  Charlett Blake 09/18/2019, 7:44 AM

## 2019-09-18 NOTE — Care Management Note (Signed)
[facility] Individual Statement of Services  Patient Name:  April Le  Date:  09/18/2019  Welcome to the [facility].  Our goal is to provide you with an individualized program based on your diagnosis and situation, designed to meet your specific needs.  With this comprehensive rehabilitation program, you will be expected to participate in at least 3 hours of rehabilitation therapies Monday-Friday, with modified therapy programming on the weekends.  Your rehabilitation program will include the following services:  Physical Therapy (PT), Occupational Therapy (OT), Speech Therapy (ST), 24 hour per day rehabilitation nursing, Case Management (Social Worker), Rehabilitation Medicine, Nutrition Services and Pharmacy Services  Weekly team conferences will be held on Wednesday to discuss your progress.  Your Social Worker will talk with you frequently to get your input and to update you on team discussions.  Team conferences with you and your family in attendance may also be held.  Expected length of stay: 18-21 days  Overall anticipated outcome: supervision-min assist level  Depending on your progress and recovery, your program may change. Your Social Worker will coordinate services and will keep you informed of any changes. Your Social Worker's name and contact numbers are listed  below.  The following services may also be recommended but are not provided by the [facility]:    [place] will be made to provide these services after discharge if needed.  Arrangements include referral to agencies that provide these services.  Your insurance has been verified to be:  [employer]Med[employer]are Your primary doctor is:  Kennith Maes  Pertinent information will be shared with your doctor and your insurance company.  Social Worker:  Ovidio Kin, [place] or [phone]  Information discussed with and copy given to patient by: Elease Hashimoto, 09/18/2019, 12:59 PM

## 2019-09-18 NOTE — Evaluation (Signed)
Speech Language Pathology Assessment and Plan  Patient Details  Name: April Le MRN: [id number] Date of Birth: [birth year]  SLP Diagnosis: Aphasia;Speech and Language deficits;Dysphagia  Rehab Potential: Fair ELOS: 2.5-3 weeks    Today's Date: 09/18/2019 SLP Individual Time: 9179-1505 SLP Individual Time Calculation (min): 64 min   Problem List:  Patient Active Problem List   Diagnosis Date Noted  . 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 Hx of completed stroke 09/16/2019  . Dysphagia following cerebrovascular accident (CVA) 09/16/2019  . Malnutrition ([place]) 09/16/2019  . Acute blood loss anemia 09/16/2019  . Thrombocytopenia ([place]) 09/16/2019  . Acute ischemic left MCA stroke ([facility]) s/p IR, embolic source unknown 69/79/4801  . 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 Bilateral lower extremity edema 07/18/2019  . Weakness of left leg   . Urinary incontinence   . Tumor of ovary   . Stroke ([place])   . Sinus drainage   . Shortness of breath   . Pneumonia   . Hypothyroidism   . Hypertension   . Hypercholesteremia   . Headache   . GERD (gastroesophageal reflux disease)   . Complication of anesthesia   . CKD (chronic kidney disease), stage III   . Arthritis   . 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 Abdominal distention   . Coronary artery calcification seen on CT scan 01/10/2017  . Hypertensive heart disease with heart failure ([place]) 01/10/2017  . Abnormality of gait 10/28/2015  . 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 Chronic diastolic heart failure ([place]) 11/19/2013  . Benign essential HTN 11/19/2013  . OSA (obstructive sleep apnea) 11/19/2013  . CAP (community acquired pneumonia) 09/07/2013  . Leukocytosis 09/04/2013  . Fever of unknown origin 09/04/2013  . Hypoxia 09/04/2013  . SOB (shortness of breath) 09/04/2013  . SIRS (systemic inflammatory response syndrome) ([place]) 09/04/2013  . Mass of  ovary 10/26/2011   Past Medical History:  Past Medical History:  Diagnosis Date  . Abdominal distention    past two months  . Abnormality of gait 10/28/2015  . 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 Arthritis    right knee  . Benign essential HTN 11/19/2013  . CAP (community acquired pneumonia) 09/07/2013  . CHF (congestive heart failure) ([place])   . Chronic diastolic heart failure ([place]) 11/19/2013  . Chronic kidney disease    STAGE III KIDNEY DISEASE-PER PT--PT SEES DR. Risa Grill UROLOGIST  . Complication of anesthesia    PT REMEMBERS BREATHING PROBLEMS WAKING UP FROM KNEE SURGERY AT [facility] 2011 OR 2012  . Coronary artery calcification seen on CT scan 01/10/2017  . Fever of unknown origin 09/04/2013  . GERD (gastroesophageal reflux disease)   . Headache   . Headache(784.0)   . 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 Hypercholesteremia   . Hypertension   . Hypertensive heart disease with heart failure ([place]) 01/10/2017  . Hypothyroidism   . Hypoxia 09/04/2013  . Leukocytosis 09/04/2013  . Mass of ovary 10/26/2011  . OSA (obstructive sleep apnea) 11/19/2013  . Pneumonia   . Shortness of breath    WITH EXERTION  . Sinus drainage    PT STARTED ANTIBIOTIC 11/17/11 --IS HOARSE TODAY, SOME WHEEZING AND COUGH WITH YELLOW DRAINAGE  . SIRS (systemic inflammatory response syndrome) ([place]) 09/04/2013  . SOB (shortness of breath) 09/04/2013  .  Stroke ([place]) 02/2007, 08/2009   2 total -RESIDUAL WEAKNESS LEFT LEG--AND APHASIA  . Stroke ([place]) 09/01/2017  . Stroke ([place]) 09/08/2017  . Tumor of ovary   . Urinary incontinence   . Urinary incontinence   . Weakness of left leg    Mildly, post stroke   Past Surgical History:  Past Surgical History:  Procedure Laterality Date  . ABDOMINAL HYSTERECTOMY    . CHOLECYSTECTOMY    . IR CT HEAD LTD  09/11/2019  . IR PERCUTANEOUS ART THROMBECTOMY/INFUSION  INTRACRANIAL INC DIAG ANGIO  09/11/2019  . LAPAROTOMY  11/22/2011   Procedure: EXPLORATORY LAPAROTOMY;  Surgeon: Imagene Gurney A. Alycia Rossetti, MD;  Location: [facility];  Service: Gynecology;  Laterality: N/A;  . LOOP RECORDER INSERTION N/A 09/16/2019   Procedure: LOOP RECORDER INSERTION;  Surgeon: Evans Lance, MD;  Location: [facility];  Service: Cardiovascular;  Laterality: N/A;  . MENISCUS REPAIR    . RADIOLOGY WITH ANESTHESIA N/A 09/11/2019   Procedure: IR WITH ANESTHESIA;  Surgeon: Radiologist, Medication, MD;  Location: [facility];  Service: Radiology;  Laterality: N/A;  . SALPINGOOPHORECTOMY  11/22/2011   Procedure: SALPINGO OOPHERECTOMY;  Surgeon: Imagene Gurney A. Alycia Rossetti, MD;  Location: [facility];  Service: Gynecology;  Laterality: Bilateral;  . TOE SURGERY    . TONSILLECTOMY    . TUBAL LIGATION      Assessment / Plan / Recommendation Clinical Impression 78 year old right-handed female with history of diastolic congestive heart failurewith pulmonaryfibrosis, prior CVA with mild left-sided weakness and mild aphasia maintained on aspirin 81 mg daily and Plavix, hypertension, hyperlipidemia, CKD stage III.Presented 09/11/2019 nonverbal with right side weakness. SARS Covid negative, creatinine 1.25, hemoglobin 11.8, WBC 10,700. Cranial CT scan showed no hemorrhage or visible acute infarction. Patient did not receive TPA. CT angiogram of head and neck showed left distal M1 embolism extending into proximal M2 branches with poorly enhancing downstream vessels. Remote moderate left lateral frontal and upper insular infarction. CT perfusion showed a 38 cc acute infarct and 56 cc of superimposed penumbra. Underwent cerebral angiogram with complete revascularization of occluded Lt LMCAM1 segment. MRI follow-up showed acute cortical infarct involving majority of left MCA distribution. There was patchy petechial hemorrhage. Echocardiogram with ejection fraction of 65%. Lower extremity Dopplers negative for DVT.  Neurology follow-up currently on aspirin 325 mg daily as well as Plavix. Loop recorder has been placed. Patient remains on Lovenox for DVT prophylaxis.Patient is currently n.p.o. with nasogastric tube feeds.Patient has had episodes of sundowning maintained on Aricept as well as Seroquel. Patient with episode of shortness of breath respiratory 26 oxygen saturation 97% 09/17/2019 there was some copious secretions.chest x-ray completed showing no active disease patient did receive an extra dose of Lasix and troponin was negative.  Pt presents with severe expressive and receptive aphasia. Pt demonstrates intermittent comprehension of basic commands, ability to follow 1 step commands during oral motor exam with demonstration cues but unable to follow 1 body movement or with objects. Pt demonstrated some automatic language waving goodbye, counting 1-5 in unison with 60% accuracy and sing 25% of "Happy Birthday" song in unison. Pt was able to repeat her name, but unable to repeat other words. Pt was able to answer basic yes/no questions in 2/6 trials, name common objects in 0/3 trials, identify common objects in a field of 2 in 0/5 trials, match object to photograph in a field of 2 in 1/3 trials, read  at word level in 0/3 trials, match word to object in a field of 2 in  0/3 trials and unable to copy at letter level given dotted lines. Pt indicated wants/needs with gestures, removing blanket and attempting to get up, but unable to respond to yes/no questions when it appeared pt wanted to use the bathroom. Cognitive ability was difficult to assess due to language impairment. Reduced focused and sustained attention noted as well was possible vision deficit favoriting items on left only with ability to bring attention to right side. Pt presents with moderate oropharyngeal dysphagia during trials if ice chips, thin via tsp, NTL via tsp and dys 1. Pt demonstrated initial atypical mastication pattern consuming ice  chips, however was able to demonstrate appropriate mastication on 3rd trial. Pt's swallow appeared timely with al trials and perseveration of mastication, indicating reduced bolus awareness, following swallow with dys 2 textures only. Pt demonstrated delayed cough on 2 out 5 trials of ice chips and delayed throat clear on 3 out 5 trials of thin liquids via TSP. Pt demonstrated no overt s/s aspiration on NTL via tsp.  SLP recommends continued NPO with trials of thin to assess readiness for instrumental study. Pt would benefit from skilled ST services in order to maximize functional independence and reduce burden of care, likely requiring 24 hour supervision and continue ST services.   Skilled Therapeutic Interventions           Skilled ST services focused on language skills. SLP administered language assessment, educated pt on results and created plan to address deficits. Pt required max A verbal/tatcile cues for focused and sustained attention during assessment and treatment. SLP provided semantic, demonstration and hand over hand cuing during language tasks. All questions were answered to satisfaction. Pt was left in room with husband, call bell within reach and bed alarm set. ST recommends to continue skilled ST services.  SLP Assessment  Patient will need skilled [place] [facility] Services during CIR admission    Recommendations  SLP Diet Recommendations: NPO Medication Administration: Via alternative means Oral Care Recommendations: Oral care QID;Staff/trained caregiver to provide oral care Patient destination: Home Follow up Recommendations: Home Health SLP;24 hour supervision/assistance Equipment Recommended: None recommended by SLP    SLP Frequency 3 to 5 out of 7 days   SLP Duration  SLP Intensity  SLP Treatment/Interventions 2.5-3 weeks  Minumum of 1-2 x/day, 30 to 90 minutes  Cognitive remediation/compensation;Cueing hierarchy;Dysphagia/aspiration precaution  training;Functional tasks;Internal/external aids;Patient/family education;Speech/Language facilitation    Pain Pain Assessment Pain Score: 0-No pain  Prior Functioning Cognitive/Linguistic Baseline: Baseline deficits Baseline deficit details: Mild aphasia  Type of Home: House  Lives With: Spouse Available Help at Discharge: Family;Available 24 hours/day;Other (Comment)  SLP Evaluation Cognition Overall Cognitive Status: Difficult to assess Arousal/Alertness: Awake/alert Orientation Level: Oriented to person Attention: Sustained;Focused Focused Attention: Impaired Sustained Attention: Impaired Memory: (difficult to assess due to language impairment) Immediate Memory Recall: (aphasia) Awareness: Impaired Awareness Impairment: Intellectual impairment Problem Solving: Impaired Executive Function: Initiating Initiating: Impaired Initiating Impairment: Verbal basic;Functional basic Safety/Judgment: Impaired  Comprehension Auditory Comprehension Overall Auditory Comprehension: Impaired Yes/No Questions: Impaired Basic Biographical Questions: 26-50% accurate Basic Immediate Environment Questions: 25-49% accurate Commands: Impaired One Step Basic Commands: 0-24% accurate Interfering Components: Motor planning EffectiveTechniques: Pausing;Repetition Visual Recognition/Discrimination Discrimination: Not tested Reading Comprehension Reading Status: Impaired Word level: Impaired Sentence Level: Not tested Paragraph Level: Not tested Functional Environmental (signs, name badge): Not tested Interfering Components: Attention;Right neglect/inattention Expression Expression Primary Mode of Expression: Verbal Verbal Expression Overall Verbal Expression: Impaired Initiation: Impaired Automatic Speech: Name;Social Response Level of Generative/Spontaneous Verbalization: Phrase Repetition: Impaired Level of  Impairment: Word level Naming: Impairment Confrontation: Impaired Verbal  Errors: Neologisms;Perseveration;Echolalia;Jargon Interfering Components: Attention Written Expression Dominant Hand: Right Written Expression: Exceptions to [facility] Trace Ability: Letter Copy Ability: Letter Oral Motor Oral Motor/Sensory Function Overall Oral Motor/Sensory Function: Mild impairment Facial ROM: Reduced right Facial Symmetry: Abnormal symmetry right Facial Strength: Reduced right Lingual ROM: Reduced left;Reduced right Lingual Symmetry: Within Functional Limits Mandible: Within Functional Limits Motor Speech Overall Motor Speech: Impaired Respiration: Within functional limits Phonation: Normal Resonance: Within functional limits Level of Impairment: Word Intelligibility: Intelligibility reduced Word: 0-24% accurate Motor Planning: Impaired Level of Impairment: Word Motor Speech Errors: Consistent   PMSV Assessment  PMSV Trial Intelligibility: Intelligibility reduced Word: 0-24% accurate  Bedside Swallowing Assessment General Date of Onset: 09/11/19 Diet Prior to this Study: NPO History of Recent Intubation: Yes Length of Intubations (days): 0 days Date extubated: 09/11/19 Behavior/Cognition: Alert;Cooperative;Doesn't follow directions Oral Cavity - Dentition: Adequate natural dentition Self-Feeding Abilities: Total assist Vision: (questions right field cut/inattention) Patient Positioning: Upright in bed Volitional Cough: Cognitively unable to elicit Volitional Swallow: Unable to elicit  Oral Care Assessment Does patient have any of the following "high(er) risk" factors?: Nutritional status - fluids only or NPO for >24 hours Does patient have any of the following "at risk" factors?: Oxygen therapy - cannula, mask, simple oxygen devices Patient is HIGH RISK: Non-ventilated: Order set for Adult Oral Care Protocol initiated - "High Risk Patients - Non-Ventilated" option selected  (see row information) Patient is AT RISK: Order set for Adult Oral Care  Protocol initiated -  "At Risk Patients" option selected (see row information) Ice Chips Ice chips: Impaired Pharyngeal Phase Impairments: Cough - Delayed Thin Liquid Thin Liquid: Impaired Presentation: Spoon Pharyngeal  Phase Impairments: Throat Clearing - Delayed Nectar Thick Presentation: Spoon Honey Thick Honey Thick Liquid: Not tested Puree Puree: Impaired Presentation: Spoon Oral Phase Impairments: Poor awareness of bolus Other Comments: continued mastictaion after swallow Solid   BSE Assessment Risk for Aspiration Impact on safety and function: Severe aspiration risk Other Related Risk Factors: Previous CVA;Cognitive impairment  Short Term Goals: Week 1: SLP Short Term Goal 1 (Week 1): Pt will consume thin trials (Ice chips) with minimal overt s/s aspiration to indicate readiness for instrumental assessment. SLP Short Term Goal 2 (Week 1): Pt will demonstrate sustained attention in 1 minute interval with mod A verbal cues for redirection. SLP Short Term Goal 3 (Week 1): Pt will express wants/needs via multimodal communciation with max A verbal/visual cues. SLP Short Term Goal 4 (Week 1): Pt will follow 1 step commands with max A verbal/visual cues in 3 out 5 opportunties. SLP Short Term Goal 5 (Week 1): Pt will produce automatic language sequences with 30% accuracy.  Refer to Care Plan for Long Term Goals  Recommendations for other services: None   Discharge Criteria: Patient will be discharged from SLP if patient refuses treatment 3 consecutive times without medical reason, if treatment goals not met, if there is a change in medical status, if patient makes no progress towards goals or if patient is discharged from hospital.  The above assessment, treatment plan, treatment alternatives and goals were discussed and mutually agreed upon: by patient  Sanela Evola  [facility] 09/18/2019, 5:07 PM

## 2019-09-18 NOTE — Progress Notes (Signed)
Inpatient Rehabilitation  Patient information reviewed and entered into eRehab system by Delenn Ahn M. Giovonni Poirier, M.A., CCC/SLP, PPS Coordinator.  Information including medical coding, functional ability and quality indicators will be reviewed and updated through discharge.    

## 2019-09-19 ENCOUNTER — Inpatient Hospital Stay (HOSPITAL_COMMUNITY): Payer: Medicare Other

## 2019-09-19 ENCOUNTER — Inpatient Hospital Stay (HOSPITAL_COMMUNITY): Payer: Medicare Other | Admitting: Physical Therapy

## 2019-09-19 ENCOUNTER — Inpatient Hospital Stay (HOSPITAL_COMMUNITY): Payer: Medicare Other | Admitting: Occupational Therapy

## 2019-09-19 DIAGNOSIS — I509 Heart failure, unspecified: Secondary | ICD-10-CM

## 2019-09-19 MED ORDER — ALBUTEROL SULFATE (2.5 MG/3ML) 0.083% IN NEBU: 2.5000 mg | INHALATION_SOLUTION | RESPIRATORY_TRACT | Status: DC | PRN

## 2019-09-19 MED FILL — Medication: Qty: 1 | Status: AC

## 2019-09-19 NOTE — Progress Notes (Signed)
STROKE TEAM PROGRESS NOTE   INTERVAL HISTORY Daughter and husband are at the bedside. OT came in at the end of encounter. Pt is still on CPAP, initially sleeping but arousable. Still not following commands, moving all extremities minimally. Discussed with CIR PA, will try to remove CPAP to facilitate arousal.   Vitals:   09/18/19 1513 09/18/19 1600 09/18/19 1937 09/19/19 0528  BP: (!) 172/82  (!) 148/76 (!) 171/80  Pulse: 67 73 72 60  Resp:  18 20 18   Temp:   97.9 F (36.6 C) 98.3 F (36.8 C)  TempSrc:   Oral Axillary  SpO2: 99% 97% 99% 97%  Weight:    (!) 150.1 kg  Height:        CBC:  Recent Labs  Lab 09/17/19 0321 09/18/19 0530  WBC 7.5 7.5  NEUTROABS  --  5.2  HGB 10.8* 11.2*  HCT 32.4* 33.6*  MCV 92.6 92.3  PLT 171 [id number]    Basic Metabolic Panel:  Recent Labs  Lab 09/17/19 0321 09/18/19 0530  NA 141 142  K 3.7 4.0  CL 106 104  CO2 27 24  GLUCOSE 163* 151*  BUN 15 22  CREATININE 0.86 0.93  CALCIUM 8.5* 8.8*   Lipid Panel:     Component Value Date/Time   CHOL 116 09/12/2019 0507   TRIG 169 (H) 09/12/2019 0507   HDL 39 (L) 09/12/2019 0507   CHOLHDL 3.0 09/12/2019 0507   VLDL 34 09/12/2019 0507   LDLCALC 43 09/12/2019 0507   HgbA1c:  Lab Results  Component Value Date   HGBA1C 6.7 (H) 09/12/2019   Urine Drug Screen: No results found for: LABOPIA, COCAINSCRNUR, LABBENZ, AMPHETMU, THCU, LABBARB  Alcohol Level No results found for: [facility]  IMAGING Dg Chest Port 1 View  Result Date: 09/17/2019 CLINICAL DATA:  78 year old female with shortness of breath. Feeding tube. EXAM: PORTABLE CHEST 1 VIEW COMPARISON:  09/16/2019 portable chest and earlier. FINDINGS: Portable AP semi upright view at 1108 hours. Stable somewhat low lung volumes. Allowing for portable technique the lungs are clear. Mediastinal contours remain within normal limits. Left medial chest cardiac event recorder again noted. Enteric feeding tube courses into the abdomen, and a portion of this  appears to be visible across midline near the cholecystectomy clips. Negative visible bowel gas pattern. IMPRESSION: 1.  No acute cardiopulmonary abnormality. 2. Enteric feeding tube courses to the abdomen and probably is post pyloric, incompletely visible. Electronically Signed   By: Genevie Ann M.D.   On: 09/17/2019 11:21   Ct Head Code Stroke Wo Contrast  Result Date: 09/18/2019 CLINICAL DATA:  Code stroke. Focal neuro deficit, less than 6 hours, stroke suspected. Patient unresponsive beginning 1 hour ago. Witnessed seizure. EXAM: CT HEAD WITHOUT CONTRAST TECHNIQUE: Contiguous axial images were obtained from the base of the skull through the vertex without intravenous contrast. COMPARISON:  CT head without contrast 09/11/2019. MR head without contrast 09/12/2019. FINDINGS: Brain: Evolving left MCA territory infarct is again noted. There is pseudonormalization of the cortex over the left hemisphere. There is no significant expansion of the infarct territory. There is loss of distinction in the left lentiform nucleus and internal capsule. White matter changes are present on the right. Right basal ganglia and insular cortex is normal. Remote right occipital infarct is again seen. Brainstem and cerebellum are normal. Vascular: Atherosclerotic changes are noted within the cavernous internal carotid arteries. There is no hyperdense vessel. Skull: Calvarium is intact. No focal lytic or blastic lesions are present. Sinuses/Orbits:  The paranasal sinuses and mastoid air cells are clear. The globes and orbits are within normal limits. ASPECTS [facility] Stroke Program Early CT Score) - Ganglionic level infarction (caudate, lentiform nuclei, internal capsule, insula, M1-M3 cortex): 7/7 - Supraganglionic infarction (M4-M6 cortex): 3/3 Total score (0-10 with 10 being normal): 10/10 IMPRESSION: 1. Expected evolution of left MCA territory infarct without significant expansion of the infarct territory. 2. No acute hemorrhage. 3.  Stable diffuse white matter disease. 4. Stable remote right occipital lobe infarct. 5. ASPECTS is 10/10 The above was relayed via text pager to Dr. Rosalin Hawking on 09/18/2019 at 16:12 . Electronically Signed   By: San Morelle M.D.   On: 09/18/2019 16:15    PHYSICAL EXAM  Temp:  [97.9 F (36.6 C)-98.7 F (37.1 C)] 98.7 F (37.1 C) (11/05 1532) Pulse Rate:  [60-72] 66 (11/05 1532) Resp:  [18-20] 18 (11/05 1532) BP: (148-171)/(76-80) 160/78 (11/05 1532) SpO2:  [93 %-100 %] 100 % (11/05 1532) Weight:  [150.1 kg] 150.1 kg (11/05 0528)  General - Well nourished, well developed, on CPAP.  Ophthalmologic - fundi not visualized due to noncooperation.  Cardiovascular - Regular rhythm and rate.  Neuro - on CPAP, initially sleeping but arousable with voice with eyes half way open, but still non verbal and not following commands. Pupils 2.77mm bilaterally, reactive to light. Eyes mid position bilaterally, blinking to visual threat bilaterally. Able to track to voice bilaterally. Right facial droop. Mild spontaneous movement in all extremities but not against gravity. Sensation, coordination and gait not tested.  ASSESSMENT/PLAN Ms. Natira Lungren is a 78 y.o. female with hx of chronic diastolic heart failure,prior left MCA stroke, hypertension, hyperlipidemia was recently admitted to [facility] on 09/11/19 for left MCA infarct and left M1 occlusion s/p thrombectomy with residue global aphasia and dysphagia. In CIR 11/4 had GTC and minimally responsive post clinical seizure with resultant intermittent myoclonus.   Seizure  GTC episode 11/4  Treated with ativan 2mg  x 2  Loaded with Keppra  CT neg for hemorrhage, expected evolution. ASPECTS 10/10  EEG moderate to severe diffuse encephalopathy, no seizure or epileptiform discharges  Continue Keppra 500 bid  Seizure precautions  Ativan PRN for seizure episode  Stroke: left MCA acute infarct due to left M1 occlusions/p IR with TICI 3  revascularization w/ resultant petechial hemorrhage, embolic pattern, source unclear.   Resultant - global aphasia and dysphagia  CT neg for hemorrhage, expected evolution. ASPECTS 10/10  Loop recorder placed 11/2 to rule out AF as source of stroke  On aspirin 325 mg daily and clopidogrel 75 mg daily  Currently undergoing therapy in CIR  Hx stroke/TIA  10/2017 30-day Cardiac event monitor no A. fib  2016 seen by [name] in the office - resultant gait d/o and dysarthria due to extensive small vessel disease, no acute infarct on MRI. On Aggrenox  08/2009 - pt reported stroke, details not available in [facility]  02/2007 - pt reported stroke, details not available in Epic  Hypertension  BP goal < 180  On lopressor and lasix  Hyperlipidemia  On Lipitor 20  Last LDL 43, goal < 70  Continue statin  Dysphagia Malnutrition  Secondary to stroke  On TF at 70 and free water 200 q6h  Speech on board  Sundowning   On Aricept and seroquel 25mg  Qhs    Pulmonary fibrosis   CXR - no acute finding  CKD stage III  Cre 0.93   BMP pending in am  Other Stroke Risk Factors  Advanced age  Chronic diastolic Congestive heart failure - on lasix  Hospital day # 2  Rosalin Hawking, MD PhD Stroke Neurology 09/19/2019 6:12 PM  To contact Stroke Continuity provider, please refer to [email or web address] After hours, contact General Neurology

## 2019-09-19 NOTE — Progress Notes (Signed)
Speech Language Pathology Daily Session Note  Patient Details  Name: April Le MRN: [id number] Date of Birth: 1941/03/21  Today's Date: 09/19/2019 SLP Individual Time: 1046-1106 SLP Individual Time Calculation (min): 20 min  Short Term Goals: Week 1: SLP Short Term Goal 1 (Week 1): Pt will consume thin trials (Ice chips) with minimal overt s/s aspiration to indicate readiness for instrumental assessment. SLP Short Term Goal 2 (Week 1): Pt will demonstrate sustained attention in 1 minute interval with mod A verbal cues for redirection. SLP Short Term Goal 3 (Week 1): Pt will express wants/needs via multimodal communciation with max A verbal/visual cues. SLP Short Term Goal 4 (Week 1): Pt will follow 1 step commands with max A verbal/visual cues in 3 out 5 opportunties. SLP Short Term Goal 5 (Week 1): Pt will produce automatic language sequences with 30% accuracy.  Skilled Therapeutic Interventions: Skilled ST services focused on cognitive skills. Pt's participation in yesterday's CT scan indicated expected left CVA extension and EEG indicated moderate-severe diffuse encephalopathy. NT was changing brief in bed upon entering, pt required +2 and total-max A for basic commands and bed mobility. Pt was alert during brief changing (moaning), however eyes remained closed and unable to respond to verbal basic questions. SLP removed CPAP and replaced with O2 nasal canal, in hopes of improved alertness and participation with reduce restrictions, however pt was unable to open eyes or respond verbally given max tactile stimuli including cold compress. SLP reapplied CPAP to pt and notified nurse to assess application. Pt missed 25 minutes. Pt was left in room with husband, call bell within reach and bed alarm set. ST recommends to continue skilled ST services.      Pain Pain Assessment Pain Score: 0-No pain  Therapy/Group: Individual Therapy  Myracle Febres  [facility] 09/19/2019, 11:22 AM

## 2019-09-19 NOTE — Progress Notes (Signed)
Occupational Therapy Session Note  Patient Details  Name: Estalene Kalu MRN: [id number] Date of Birth: 1941/09/08  Today's Date: 09/19/2019 OT Individual Time: [id number] Individual Time Calculation (min): 54 min    Short Term Goals: Week 1:  OT Short Term Goal 1 (Week 1): Pt will perform mod A for toileting. OT Short Term Goal 2 (Week 1): Pt will perform Mod A for bathing tasks at sink. OT Short Term Goal 3 (Week 1): Pt will perform UB dressing with min A overall.  Skilled Therapeutic Interventions/Progress Updates:    Pt in bed with eyes closed to start session.  She would demonstrate generalized response with UE spontaneous movement in the LUE with therapist verbal stimulation and hand on the left shoulder.  She would not however open her eyes in this position.  Therapist provided total assist for supine to sit EOB with HOB elevated at 30 degrees secondary to NG tube.  She did assist with moving her LLE and some with trunk flexion when therapist brought her trunk forward.  She needed total assist for transition and for scooting to the EOB however.  Once sitting, she maintained flexed head and trunk but could maintain sitting balance with supervision.  Max assist to maintain neutral cervical extension as well as total hand over hand assist for washing her face or attempting to comb her hair with the LUE.  She did open her eyes for brief period of time, lasting up to 5 mins with family in front of her and therapist behind on the bed facilitating cervical extension and trunk extension.  She was able to tolerate sitting EOB for at least 20 mins.  Increased posterior pushing noted in trunk when therapist was behind her facilitating trunk extension.  She was able to complete sit to stand on 2 occasions with total assist before returning back to the bed.  Total assist for sit to supine to complete session.  Call button and phone in reach with family and NT present at end of session.     Therapy  Documentation Precautions:  Precautions Precautions: Fall Precaution Comments: cortrak Restrictions Weight Bearing Restrictions: No   Vital Signs: Therapy Vitals Temp: 98.7 F (37.1 C) Pulse Rate: 66 Resp: 18 BP: (!) 160/78 Patient Position (if appropriate): Lying Oxygen Therapy SpO2: 93% O2 Device: Nasal Cannula O2 Flow Rate (L/min): 4 L/min Pulse Oximetry Type: Continuous Pain: Pain Assessment Pain Scale: Faces Faces Pain Scale: Hurts little more Pain Type: Acute pain Pain Location: Leg Pain Orientation: Left Pain Descriptors / Indicators: Grimacing Pain Onset: With Activity Pain Intervention(s): Repositioned ADL: See Care Tool Section for some details of ADL and mobility  Therapy/Group: Individual Therapy  Montavius Subramaniam OTR/L 09/19/2019, 4:28 PM

## 2019-09-19 NOTE — Progress Notes (Signed)
Occupational Therapy Session Note  Patient Details  Name: April Le MRN: [id number] Date of Birth: [birth year]  Today's Date: 09/19/2019 OT Individual Time: 1330-1400 OT Individual Time Calculation (min): 30 min    Short Term Goals: Week 1:  OT Short Term Goal 1 (Week 1): Pt will perform mod A for toileting. OT Short Term Goal 2 (Week 1): Pt will perform Mod A for bathing tasks at sink. OT Short Term Goal 3 (Week 1): Pt will perform UB dressing with min A overall.  Skilled Therapeutic Interventions/Progress Updates:    Pt received supine with MD present encouraging therapy and providing update. Pt's husband and daughter present throughout session. CPAP removed and pt's SpO2 sat dropped to 87%, 4L [place] applied and spO2 rose to 93%. Bed mobility facilitated and pt rolled toward the R side, with HOH provided to grasp bedrail with LUE. Total A provided to transfer pt to EOB. Edu provided to family and pt on impact of positioning on lung expansion and saturation percentage. While sitting EOB, pt minimally responsive, but with max cueing able to follow 1 step direction. SpO2 also rose to 96% EOB. Pt was returned to supine and proper position in bed facilitated with total A. Pt was left supine with HOB raised to 30 degrees. Bed alarm set and all needs met.   Therapy Documentation Precautions:  Precautions Precautions: Fall Precaution Comments: cortrak Restrictions Weight Bearing Restrictions: No   Therapy/Group: Individual Therapy  Curtis Sites 09/19/2019, 2:33 PM

## 2019-09-19 NOTE — Progress Notes (Signed)
Spoke patient's name and tried to wake her, patient did not respond or seem alert.  Also patient is getting tube feedings through nose and this RT is concerned that patient might not be alert enough to pull mask off should she have an emesis episode.  No distress noted at this time, patient sat is 95% on [place] 4L.  Will continue to monitor.

## 2019-09-19 NOTE — Progress Notes (Signed)
[place] PHYSICAL MEDICINE & REHABILITATION PROGRESS NOTE   Subjective/Complaints:  Events noted , no further seizure noted , remains sedated Discussed with PT   ROS- limited by cognition   Objective:   Dg Chest Port 1 View  Result Date: 09/17/2019 CLINICAL DATA:  78 year old female with shortness of breath. Feeding tube. EXAM: PORTABLE CHEST 1 VIEW COMPARISON:  09/16/2019 portable chest and earlier. FINDINGS: Portable AP semi upright view at 1108 hours. Stable somewhat low lung volumes. Allowing for portable technique the lungs are clear. Mediastinal contours remain within normal limits. Left medial chest cardiac event recorder again noted. Enteric feeding tube courses into the abdomen, and a portion of this appears to be visible across midline near the cholecystectomy clips. Negative visible bowel gas pattern. IMPRESSION: 1.  No acute cardiopulmonary abnormality. 2. Enteric feeding tube courses to the abdomen and probably is post pyloric, incompletely visible. Electronically Signed   By: Genevie Ann M.D.   On: 09/17/2019 11:21   Ct Head Code Stroke Wo Contrast  Result Date: 09/18/2019 CLINICAL DATA:  Code stroke. Focal neuro deficit, less than 6 hours, stroke suspected. Patient unresponsive beginning 1 hour ago. Witnessed seizure. EXAM: CT HEAD WITHOUT CONTRAST TECHNIQUE: Contiguous axial images were obtained from the base of the skull through the vertex without intravenous contrast. COMPARISON:  CT head without contrast 09/11/2019. MR head without contrast 09/12/2019. FINDINGS: Brain: Evolving left MCA territory infarct is again noted. There is pseudonormalization of the cortex over the left hemisphere. There is no significant expansion of the infarct territory. There is loss of distinction in the left lentiform nucleus and internal capsule. White matter changes are present on the right. Right basal ganglia and insular cortex is normal. Remote right occipital infarct is again seen. Brainstem and  cerebellum are normal. Vascular: Atherosclerotic changes are noted within the cavernous internal carotid arteries. There is no hyperdense vessel. Skull: Calvarium is intact. No focal lytic or blastic lesions are present. Sinuses/Orbits: The paranasal sinuses and mastoid air cells are clear. The globes and orbits are within normal limits. ASPECTS [facility] Stroke Program Early CT Score) - Ganglionic level infarction (caudate, lentiform nuclei, internal capsule, insula, M1-M3 cortex): 7/7 - Supraganglionic infarction (M4-M6 cortex): 3/3 Total score (0-10 with 10 being normal): 10/10 IMPRESSION: 1. Expected evolution of left MCA territory infarct without significant expansion of the infarct territory. 2. No acute hemorrhage. 3. Stable diffuse white matter disease. 4. Stable remote right occipital lobe infarct. 5. ASPECTS is 10/10 The above was relayed via text pager to Dr. Rosalin Hawking on 09/18/2019 at 16:12 . Electronically Signed   By: San Morelle M.D.   On: 09/18/2019 16:15   Recent Labs    09/17/19 0321 09/18/19 0530  WBC 7.5 7.5  HGB 10.8* 11.2*  HCT 32.4* 33.6*  PLT 171 231   Recent Labs    09/17/19 0321 09/18/19 0530  NA 141 142  K 3.7 4.0  CL 106 104  CO2 27 24  GLUCOSE 163* 151*  BUN 15 22  CREATININE 0.86 0.93  CALCIUM 8.5* 8.8*    Intake/Output Summary (Last 24 hours) at 09/19/2019 [id number] Last data filed at 09/19/2019 0300 Gross per 24 hour  Intake 100 ml  Output -  Net 100 ml     Physical Exam: Vital Signs Blood pressure (!) 171/80, pulse 60, temperature 98.3 F (36.8 C), temperature source Axillary, resp. rate 18, height 5' (1.524 m), weight (!) 150.1 kg, SpO2 97 %.   General: No acute distress Mood and  affect are appropriate Heart: Regular rate and rhythm no rubs murmurs or extra sounds Lungs: Clear to auscultation, breathing unlabored, no rales or wheezes Abdomen: Positive bowel sounds, soft nontender to palpation, nondistended Extremities: No clubbing,  cyanosis, or edema Skin: No evidence of breakdown, no evidence of rash Neurologic: cannot cooperate with exam, withdraws to pinch in all 4 limbs  Cerebellar exam cannot copperate Musculoskeletal: Full range of motion in all 4 extremities. No joint swelling   Assessment/Plan: 1. 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  Physiatrist is providing close team supervision and 24 hour management of active medical problems listed below.  Physiatrist and rehab team continue to assess barriers to discharge/monitor patient progress toward functional and medical goals  Care Tool:  Bathing    Body parts bathed by patient: Abdomen, Right upper leg, Left upper leg   Body parts bathed by helper: Right arm, Left arm, Chest, Front perineal area, Buttocks, Right lower leg, Left lower leg, Face     Bathing assist Assist Level: Maximal Assistance - Patient 24 - 49%     Upper Body Dressing/Undressing Upper body dressing   What is the patient wearing?: Hospital gown only    Upper body assist Assist Level: Moderate Assistance - Patient 50 - 74%    Lower Body Dressing/Undressing Lower body dressing      What is the patient wearing?: Incontinence brief     Lower body assist Assist for lower body dressing: Dependent - Patient 0%     Toileting Toileting    Toileting assist Assist for toileting: Maximal Assistance - Patient 25 - 49%     Transfers Chair/bed transfer  Transfers assist  Chair/bed transfer activity did not occur: Safety/medical concerns  Chair/bed transfer assist level: Moderate Assistance - Patient 50 - 74%     Locomotion Ambulation   Ambulation assist   Ambulation activity did not occur: Safety/medical concerns  Assist level: Moderate Assistance - Patient 50 - 74% Assistive device: Walker-rolling Max distance: 10'   Walk 10 feet  activity   Assist  Walk 10 feet activity did not occur: Safety/medical concerns        Walk 50 feet activity   Assist Walk 50 feet with 2 turns activity did not occur: Safety/medical concerns         Walk 150 feet activity   Assist Walk 150 feet activity did not occur: Safety/medical concerns         Walk 10 feet on uneven surface  activity   Assist Walk 10 feet on uneven surfaces activity did not occur: Safety/medical concerns         Wheelchair     Assist Will patient use wheelchair at discharge?: Yes   Wheelchair activity did not occur: Safety/medical concerns         Wheelchair 50 feet with 2 turns activity    Assist    Wheelchair 50 feet with 2 turns activity did not occur: Safety/medical concerns       Wheelchair 150 feet activity     Assist  Wheelchair 150 feet activity did not occur: Safety/medical concerns       Blood pressure (!) 171/80, pulse 60, temperature 98.3 F (36.8 C), temperature source Axillary, resp. rate 18, height 5' (1.524 m), weight (!) 150.1 kg, SpO2 97 %.  Medical Problem List and Plan: 1.  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  Status post loop recorder placement Bedside therapy today may be another 1-2 days before pt is more alert 2.  Antithrombotics: -DVT/anticoagulation: Lovenox.             -antiplatelet therapy: Aspirin 325 mg daily, Plavix 75 mg daily- CT head no new bleed cont current antiplt agents 3. Pain Management: Tylenol as needed 4. Mood: Aricept 5 mg daily, Seroquel 25 mg nightly             -antipsychotic agents: seroquel QHS for sundowning 5. Neuropsych: This patient is not capable of making decisions on her own behalf. 6. Skin/Wound Care: Routine skin checks 7.  Fluids/Electrolytes/Nutrition: Routine in and outs with follow-up chemistries 8.  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  Monitor for any signs of fluid overload.  Lasix 40 mg twice daily Monitor daily weights 10.  Dysphagia.  Nasogastric tube feeds; is NPO- if Coretrak needs to come out due to d/c, will need to place PEG.  Follow-up speech therapy 10.  CKD stage III.  Follow-up chemistries 11.  Hypertension.  Lopressor 50 mg twice daily.  Monitor with increased mobility Vitals:   09/18/19 1937 09/19/19 0528  BP: (!) 148/76 (!) 171/80  Pulse: 72 60  Resp: 20 18  Temp: 97.9 F (36.6 C) 98.3 F (36.8 C)  SpO2: 123456 0000000  systolic hypertension permissive for now  12.  Hypothyroidism.  Synthroid 13.  Hyperlipidemia.  Lipitor 14. Aphasia- global vs receptive aphasia with confusion- will have pt evaluated by SLP- might benefit from Amantadine? 15. Low grade temp- pt had low grade temp of 100.3 on 11/3-CXR neg for PNA , WBC normal at 7.5 no recurrence thus far , if elevated again may check urine, likely has atelectasis 16.  Morbid obesity BMI 68 17. Post stroke seizure complex partial discussed with PT who witnessed RUE flexion and shaking, R LE ext and shaking, head deviation to the Right  Received Ativan , on Keppra- appreciate neuro assist  LOS: 2 days A FACE TO FACE EVALUATION WAS PERFORMED  Charlett Blake 09/19/2019, 8:07 AM

## 2019-09-19 NOTE — Progress Notes (Signed)
Physical Therapy Session Note  Patient Details  Name: April Le MRN: [id number] Date of Birth: Nov 17, 1940  Today's Date: 09/19/2019 PT Individual Time: 0900-0915 PT Individual Time Calculation (min): 15 min   Short Term Goals: Week 1:  PT Short Term Goal 1 (Week 1): Pt will perform bed mobility with mod assist PT Short Term Goal 2 (Week 1): Pt will transfer to [facility] with mod assist PT Short Term Goal 3 (Week 1): Pt will remain OOB up to 2 hours  Skilled Therapeutic Interventions/Progress Updates:   Pt received supine in bed, asleep. PT treatment focused on improved arousal. Pt unresponsive to gentle stimuli and only minimally reflexively responsive to painful stimuli in nail bed of RLE. PT obtained pt Vital signs BP172/79, HR 54. SpO2 92% on CPap with 2L/min supplemental O2. Pt remained unresponsive to painful stimuli in nail bed of BUE or sternal rub. Mild myoclonic movement noted in the RUE with movement to place BP cuff. RN made aware. Pt left in bed with all 4 rails up and bed alarm set.      Therapy Documentation Precautions:  Precautions Precautions: Fall Precaution Comments: cortrak Restrictions Weight Bearing Restrictions: No    Therapy/Group: Individual Therapy  Lorie Phenix 09/19/2019, 9:29 AM

## 2019-09-20 ENCOUNTER — Inpatient Hospital Stay (HOSPITAL_COMMUNITY): Payer: Medicare Other | Admitting: Occupational Therapy

## 2019-09-20 ENCOUNTER — Inpatient Hospital Stay (HOSPITAL_COMMUNITY): Payer: Medicare Other

## 2019-09-20 ENCOUNTER — Inpatient Hospital Stay (HOSPITAL_COMMUNITY): Payer: Medicare Other | Admitting: Physical Therapy

## 2019-09-20 DIAGNOSIS — J8410 Pulmonary fibrosis, unspecified: Secondary | ICD-10-CM

## 2019-09-20 LAB — CBC
HCT: 33.9 % — ABNORMAL LOW (ref 36.0–46.0)
Hemoglobin: 11.2 g/dL — ABNORMAL LOW (ref 12.0–15.0)
MCH: 31.4 pg (ref 26.0–34.0)
MCHC: 33 g/dL (ref 30.0–36.0)
MCV: 95 fL (ref 80.0–100.0)
Platelets: 199 10*3/uL (ref 150–400)
RBC: 3.57 MIL/uL — ABNORMAL LOW (ref 3.87–5.11)
RDW: 12.9 % (ref 11.5–15.5)
WBC: 7.9 10*3/uL (ref 4.0–10.5)
nRBC: 0 % (ref 0.0–0.2)

## 2019-09-20 LAB — BASIC METABOLIC PANEL
Anion gap: 10 (ref 5–15)
BUN: 23 mg/dL (ref 8–23)
CO2: 26 mmol/L (ref 22–32)
Calcium: 8.6 mg/dL — ABNORMAL LOW (ref 8.9–10.3)
Chloride: 100 mmol/L (ref 98–111)
Creatinine, Ser: 0.91 mg/dL (ref 0.44–1.00)
GFR calc Af Amer: >60 mL/min (ref >60)
GFR calc non Af Amer: >60 mL/min (ref >60)
Glucose, Bld: 156 mg/dL — ABNORMAL HIGH (ref 70–99)
Potassium: 4.4 mmol/L (ref 3.5–5.1)
Sodium: 136 mmol/L (ref 135–145)

## 2019-09-20 NOTE — Progress Notes (Signed)
[facility] PHYSICAL MEDICINE & REHABILITATION PROGRESS NOTE   Subjective/Complaints:  Appreciate neuro note, no further seizures noted, was up yesterday with therapy but poor participation was able to sit up at EOB with supervision  ALert , eyes open , remains globally aphasic  ROS- limited by cognition   Objective:   Ct Head Code Stroke Wo Contrast  Result Date: 09/18/2019 CLINICAL DATA:  Code stroke. Focal neuro deficit, less than 6 hours, stroke suspected. Patient unresponsive beginning 1 hour ago. Witnessed seizure. EXAM: CT HEAD WITHOUT CONTRAST TECHNIQUE: Contiguous axial images were obtained from the base of the skull through the vertex without intravenous contrast. COMPARISON:  CT head without contrast 09/11/2019. MR head without contrast 09/12/2019. FINDINGS: Brain: Evolving left MCA territory infarct is again noted. There is pseudonormalization of the cortex over the left hemisphere. There is no significant expansion of the infarct territory. There is loss of distinction in the left lentiform nucleus and internal capsule. White matter changes are present on the right. Right basal ganglia and insular cortex is normal. Remote right occipital infarct is again seen. Brainstem and cerebellum are normal. Vascular: Atherosclerotic changes are noted within the cavernous internal carotid arteries. There is no hyperdense vessel. Skull: Calvarium is intact. No focal lytic or blastic lesions are present. Sinuses/Orbits: The paranasal sinuses and mastoid air cells are clear. The globes and orbits are within normal limits. ASPECTS [facility] Stroke Program Early CT Score) - Ganglionic level infarction (caudate, lentiform nuclei, internal capsule, insula, M1-M3 cortex): 7/7 - Supraganglionic infarction (M4-M6 cortex): 3/3 Total score (0-10 with 10 being normal): 10/10 IMPRESSION: 1. Expected evolution of left MCA territory infarct without significant expansion of the infarct territory. 2. No acute  hemorrhage. 3. Stable diffuse white matter disease. 4. Stable remote right occipital lobe infarct. 5. ASPECTS is 10/10 The above was relayed via text pager to Dr. Rosalin Hawking on 09/18/2019 at 16:12 . Electronically Signed   By: San Morelle M.D.   On: 09/18/2019 16:15   Recent Labs    09/18/19 0530 09/20/19 0620  WBC 7.5 7.9  HGB 11.2* 11.2*  HCT 33.6* 33.9*  PLT 231 199   Recent Labs    09/18/19 0530 09/20/19 0620  NA 142 136  K 4.0 4.4  CL 104 100  CO2 24 26  GLUCOSE 151* 156*  BUN 22 23  CREATININE 0.93 0.91  CALCIUM 8.8* 8.6*    Intake/Output Summary (Last 24 hours) at 09/20/2019 0734 Last data filed at 09/20/2019 0500 Gross per 24 hour  Intake 650 ml  Output -  Net 650 ml     Physical Exam: Vital Signs Blood pressure (!) 157/69, pulse 64, temperature 97.7 F (36.5 C), temperature source Oral, resp. rate 18, height 5' (1.524 m), weight 69.3 kg, SpO2 96 %.   General: No acute distress Mood and affect are appropriate Heart: Regular rate and rhythm no rubs murmurs or extra sounds Lungs: Clear to auscultation, breathing unlabored, no rales or wheezes Abdomen: Positive bowel sounds, soft nontender to palpation, nondistended Extremities: No clubbing, cyanosis, or edema Skin: No evidence of breakdown, no evidence of rash Neurologic: cannot cooperate with exam, withdraws to pinch in all 4 limbs 4/5 in RUE and RLE (formal testing difficult due to problems following commands)  Cerebellar exam cannot copperate Musculoskeletal: Full range of motion in all 4 extremities. No joint swelling   Assessment/Plan: 1. 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  Physiatrist is providing close team supervision and 24 hour management of active medical problems listed below.  Physiatrist and rehab team continue to assess barriers to discharge/monitor patient  progress toward functional and medical goals  Care Tool:  Bathing    Body parts bathed by patient: Face   Body parts bathed by helper: Right arm, Left arm, Chest, Front perineal area, Buttocks, Right lower leg, Left lower leg, Face     Bathing assist Assist Level: Total Assistance - Patient < 25%     Upper Body Dressing/Undressing Upper body dressing   What is the patient wearing?: Hospital gown only    Upper body assist Assist Level: Moderate Assistance - Patient 50 - 74%    Lower Body Dressing/Undressing Lower body dressing      What is the patient wearing?: Incontinence brief     Lower body assist Assist for lower body dressing: Dependent - Patient 0%     Toileting Toileting    Toileting assist Assist for toileting: 2 Helpers     Transfers Chair/bed transfer  Transfers assist  Chair/bed transfer activity did not occur: Safety/medical concerns  Chair/bed transfer assist level: Moderate Assistance - Patient 50 - 74%     Locomotion Ambulation   Ambulation assist   Ambulation activity did not occur: Safety/medical concerns  Assist level: Moderate Assistance - Patient 50 - 74% Assistive device: Walker-rolling Max distance: 10'   Walk 10 feet activity   Assist  Walk 10 feet activity did not occur: Safety/medical concerns        Walk 50 feet activity   Assist Walk 50 feet with 2 turns activity did not occur: Safety/medical concerns         Walk 150 feet activity   Assist Walk 150 feet activity did not occur: Safety/medical concerns         Walk 10 feet on uneven surface  activity   Assist Walk 10 feet on uneven surfaces activity did not occur: Safety/medical concerns         Wheelchair     Assist Will patient use wheelchair at discharge?: Yes   Wheelchair activity did not occur: Safety/medical concerns         Wheelchair 50 feet with 2 turns activity    Assist    Wheelchair 50 feet with 2 turns activity did  not occur: Safety/medical concerns       Wheelchair 150 feet activity     Assist  Wheelchair 150 feet activity did not occur: Safety/medical concerns       Blood pressure (!) 157/69, pulse 64, temperature 97.7 F (36.5 C), temperature source Oral, resp. rate 18, height 5' (1.524 m), weight 69.3 kg, SpO2 96 %.  Medical Problem List and Plan: 1.  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  Status post loop recorder placement May resume full therapies  2.  Antithrombotics: -DVT/anticoagulation: Lovenox.             -antiplatelet therapy: Aspirin 325 mg daily, Plavix 75 mg daily- CT head no new bleed cont current antiplt agents 3. Pain Management: Tylenol as needed 4. Mood: Aricept 5 mg daily, Seroquel 25 mg nightly             -antipsychotic agents: seroquel QHS for sundowning 5. Neuropsych: This patient is not  capable of making decisions on her own behalf. 6. Skin/Wound Care: Routine skin checks 7. Fluids/Electrolytes/Nutrition: Routine in and outs with follow-up chemistries 8.  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  Monitor for any signs of fluid overload.  Lasix 40 mg twice daily Monitor daily weights 10.  Dysphagia.  Nasogastric tube feeds; is NPO- if Coretrak needs to come out due to d/c, will need to place PEG.  Follow-up speech therapy 10.  CKD stage III.  Follow-up chemistries 11.  Hypertension.  Lopressor 50 mg twice daily.  Monitor with increased mobility Vitals:   09/19/19 1942 09/20/19 0657  BP: (!) 181/77 (!) 157/69  Pulse: 67 64  Resp: 19 18  Temp: 98 F (36.7 C) 97.7 F (36.5 C)  SpO2: 123456 [id number]  systolic hypertension permissive for now  12.  Hypothyroidism.  Synthroid 13.  Hyperlipidemia.  Lipitor 14. Aphasia- global vs  receptive aphasia with confusion- will have pt evaluated by SLP- might benefit from Amantadine? 15. Low grade temp- pt had low grade temp of 100.3 on 11/3-CXR neg for PNA , WBC normal at 7.5 no recurrence thus far , if elevated again may check urine, likely has atelectasis 16.  Morbid obesity BMI 68 17. Post stroke seizure complex partial discussed with PT who witnessed RUE flexion and shaking, R LE ext and shaking, head deviation to the Right  Received Ativan , on Keppra 500mg  BID as per Neuro  LOS: 3 days A FACE TO FACE EVALUATION WAS PERFORMED  Charlett Blake 09/20/2019, 7:34 AM

## 2019-09-20 NOTE — Progress Notes (Signed)
Physical Therapy Session Note  Patient Details  Name: April Le MRN: [id number] Date of Birth: Feb 28, 1941  Today's Date: 09/20/2019 PT Individual Time: 0800-0855 PT Individual Time Calculation (min): 55 min   Short Term Goals: Week 1:  PT Short Term Goal 1 (Week 1): Pt will perform bed mobility with mod assist PT Short Term Goal 2 (Week 1): Pt will transfer to [facility] with mod assist PT Short Term Goal 3 (Week 1): Pt will remain OOB up to 2 hours  Skilled Therapeutic Interventions/Progress Updates:   Pt received supine in bed, asleep. Pt aroused easily and responding intermittently to yes/no questions. PT obtained portable O2 tank and bariatric stedy. Supine>sit with mod assist to initiate movement and then pt able to use BUE and BLE to perform partial movement. Scooting to EOB with mod assist and facilitation to initiate, but then able to perform partial reciprocal scooting to EOB.   Sitting balance EOB with intermittent min assist to prevent L LOB, but able to sustain supervision assist up to 2 minutes at time. Facilitation for BUE placement to improve posture and prevent lateral LOB. Pt's noted to have incontinent brief.   Sit<>stand in stedy x 3 with mod assist to initiate movement to remove and replace brief. Pt able to attain full standing on 3rd attempt with mod assist from PT to pelvis to improve hip extension. Moderate multimodal cues to proper UE placement throughout sit<>stand from stedy.   Sit<>stand x 2 from EOB with mod assist and pt LUE on PT shoulder. Pt noted to have no knee instability once in standing, but noticeable posterior bias. Performed lateral scoot in standing for improved positioning to return to supine. Pt initiated and completed 2 posterior scoots with min assist for safety without cues or assist to initiate and appropriate motor plan.   Sit>supine through ling sitting, as pt initiated this strategy to return to bed, with mod assist form PT  At BLE. Pt left supine  in bed with call bell in reach and all needs met at 30 deg and tube feed running.        Therapy Documentation Precautions:  Precautions Precautions: Fall Precaution Comments: cortrak Restrictions Weight Bearing Restrictions: No    Vital Signs: Therapy Vitals Temp: 97.7 F (36.5 C) Temp Source: Oral Pulse Rate: 64 Resp: 18 BP: (!) 148/89 Patient Position (if appropriate): Orthostatic Vitals Oxygen Therapy SpO2: 92 % O2 Device: Nasal Cannula O2 Flow Rate (L/min): 4 L/min Pain: Faces: no pain   Therapy/Group: Individual Therapy  Lorie Phenix 09/20/2019, 8:59 AM

## 2019-09-20 NOTE — Progress Notes (Signed)
Physical Therapy Note  Patient Details  Name: April Le MRN: [id number] Date of Birth: 1941/05/25 Today's Date: 09/20/2019    Attempted to see pt for makeup time, with pt received in bed & minimally opening eyes with verbal and some tactile stimulation. Will f/u when pt is more alert & per POC.   Waunita Schooner 09/20/2019, 3:04 PM

## 2019-09-20 NOTE — Progress Notes (Signed)
Patient still showing signs of confusion.  Cpap usage is ill advised at this time.  Will continue to monitor.

## 2019-09-20 NOTE — Progress Notes (Signed)
STROKE TEAM PROGRESS NOTE   INTERVAL HISTORY Pt RN at desk outside the door. Pt lethargic but eyes open awake, tracking bilaterally. Still has global aphasia. Now on 2L. As per RN, pt worked with PT/OT this am and got up for shower. Now a little exhausted. Moving all extremities.    Vitals:   09/20/19 0500 09/20/19 0657 09/20/19 0847 09/20/19 0848  BP:  (!) 157/69 (!) 148/89   Pulse:  64    Resp:  18    Temp:  97.7 F (36.5 C)    TempSrc:  Oral    SpO2:  96% 96% 92%  Weight: 69.3 kg     Height:        CBC:  Recent Labs  Lab 09/18/19 0530 09/20/19 0620  WBC 7.5 7.9  NEUTROABS 5.2  --   HGB 11.2* 11.2*  HCT 33.6* 33.9*  MCV 92.3 95.0  PLT 231 [id number]    Basic Metabolic Panel:  Recent Labs  Lab 09/18/19 0530 09/20/19 0620  NA 142 136  K 4.0 4.4  CL 104 100  CO2 24 26  GLUCOSE 151* 156*  BUN 22 23  CREATININE 0.93 0.91  CALCIUM 8.8* 8.6*   Lipid Panel:     Component Value Date/Time   CHOL 116 09/12/2019 0507   TRIG 169 (H) 09/12/2019 0507   HDL 39 (L) 09/12/2019 0507   CHOLHDL 3.0 09/12/2019 0507   VLDL 34 09/12/2019 0507   LDLCALC 43 09/12/2019 0507   HgbA1c:  Lab Results  Component Value Date   HGBA1C 6.7 (H) 09/12/2019   Urine Drug Screen: No results found for: LABOPIA, COCAINSCRNUR, LABBENZ, AMPHETMU, THCU, LABBARB  Alcohol Level No results found for: ETH  IMAGING Ct Head Code Stroke Wo Contrast  Result Date: 09/18/2019 CLINICAL DATA:  Code stroke. Focal neuro deficit, less than 6 hours, stroke suspected. Patient unresponsive beginning 1 hour ago. Witnessed seizure. EXAM: CT HEAD WITHOUT CONTRAST TECHNIQUE: Contiguous axial images were obtained from the base of the skull through the vertex without intravenous contrast. COMPARISON:  CT head without contrast 09/11/2019. MR head without contrast 09/12/2019. FINDINGS: Brain: Evolving left MCA territory infarct is again noted. There is pseudonormalization of the cortex over the left hemisphere. There is no  significant expansion of the infarct territory. There is loss of distinction in the left lentiform nucleus and internal capsule. White matter changes are present on the right. Right basal ganglia and insular cortex is normal. Remote right occipital infarct is again seen. Brainstem and cerebellum are normal. Vascular: Atherosclerotic changes are noted within the cavernous internal carotid arteries. There is no hyperdense vessel. Skull: Calvarium is intact. No focal lytic or blastic lesions are present. Sinuses/Orbits: The paranasal sinuses and mastoid air cells are clear. The globes and orbits are within normal limits. ASPECTS [name] Stroke Program Early CT Score) - Ganglionic level infarction (caudate, lentiform nuclei, internal capsule, insula, M1-M3 cortex): 7/7 - Supraganglionic infarction (M4-M6 cortex): 3/3 Total score (0-10 with 10 being normal): 10/10 IMPRESSION: 1. Expected evolution of left MCA territory infarct without significant expansion of the infarct territory. 2. No acute hemorrhage. 3. Stable diffuse white matter disease. 4. Stable remote right occipital lobe infarct. 5. ASPECTS is 10/10 The above was relayed via text pager to Dr. Rosalin Hawking on 09/18/2019 at 16:12 . Electronically Signed   By: San Morelle M.D.   On: 09/18/2019 16:15    PHYSICAL EXAM    Temp:  [97.7 F (36.5 C)-98.7 F (37.1 C)] 97.7 F (36.5  C) (11/06 0657) Pulse Rate:  [64-72] 64 (11/06 0657) Resp:  [18-19] 18 (11/06 0657) BP: (148-181)/(69-89) 148/89 (11/06 0847) SpO2:  [92 %-100 %] 92 % (11/06 0848) Weight:  [69.3 kg] 69.3 kg (11/06 0500)  General - Well nourished, well developed, on 2L [name].  Ophthalmologic - fundi not visualized due to noncooperation.  Cardiovascular - Regular rhythm and rate.  Neuro - mildly lethargic, but awake alert with eyes spontaneously open, still non verbal with only sounds out and not following commands except closing and opening eyes on request. Pupils 2.56mm bilaterally,  reactive to light. Eyes moving bilaterally, blinking to visual threat to the left but inconsistent to the right. Able to track to voice bilaterally. Right facial droop. Moving all extremities against gravity BUEs but 2+/5 BLEs. Sensation, coordination and gait not tested.  ASSESSMENT/PLAN [name] is a 78 y.o. female with hx of chronic diastolic heart failure,prior left MCA stroke, hypertension, hyperlipidemia was recently admitted to [name] on 09/11/19 for left MCA infarct and left M1 occlusion s/p thrombectomy with residue global aphasia and dysphagia. In CIR 11/4 had GTC and minimally responsive post clinical seizure with resultant intermittent myoclonus.   Seizure  GTC episode 11/4  Treated with ativan 2mg  x 2  Loaded with Keppra  CT neg for hemorrhage, expected evolution. ASPECTS 10/10  EEG moderate to severe diffuse encephalopathy, no seizure or epileptiform discharges  Continue Keppra 500 bid  Seizure precautions  Ativan PRN for seizure episode  No further episodes  Stroke: left MCA acute infarct due to left M1 occlusions/p IR with TICI 3 revascularization w/ resultant petechial hemorrhage, embolic pattern, source unclear.   Resultant - global aphasia and dysphagia  CT neg for hemorrhage, expected evolution. ASPECTS 10/10  Loop recorder placed 11/2 to rule out AF as source of stroke  On aspirin 325 mg daily and clopidogrel 75 mg daily. Continue DAPT on d/c  Currently undergoing therapy in CIR  Hx stroke/TIA  10/2017 30-day Cardiac event monitor no A. fib  2016 seen by [name] in the office - resultant gait d/o and dysarthria due to extensive small vessel disease, no acute infarct on MRI. On Aggrenox  08/2009 - pt reported stroke, details not available in [name]  02/2007 - pt reported stroke, details not available in Epic  Hypertension  BP goal < 180  On lopressor and lasix  Long term goal normotensive  Hyperlipidemia  On Lipitor 20  Last LDL 43,  goal < 70  Continue statin  Dysphagia Malnutrition  Secondary to stroke  On TF at 70 and free water 200 q6h  Speech on board  Sundowning   On Aricept and seroquel 25mg  Qhs    Pulmonary fibrosis   CXR - no acute finding  On CPAP at hs  Now on 2L [place] during the day  CKD stage III  Cre 0.93 -> 0.91  On TF and free water  Other Stroke Risk Factors  Advanced age  Chronic diastolic Congestive heart failure - on lasix  Hospital day # 3  Neurology will sign off. Please call with questions. Pt will follow up with stroke clinic NP at [facility] in about 4 weeks. Thanks for the consult.   Rosalin Hawking, MD PhD Stroke Neurology 09/20/2019 9:23 AM  To contact Stroke Continuity provider, please refer to [email or web address] After hours, contact General Neurology

## 2019-09-20 NOTE — Progress Notes (Signed)
Occupational Therapy Session Note  Patient Details  Name: April Le MRN: [id number] Date of Birth: 12-09-1940  Today's Date: 09/20/2019 OT Individual Time: 1002-1102 OT Individual Time Calculation (min): 60 min   Short Term Goals: Week 1:  OT Short Term Goal 1 (Week 1): Pt will perform mod A for toileting. OT Short Term Goal 2 (Week 1): Pt will perform Mod A for bathing tasks at sink. OT Short Term Goal 3 (Week 1): Pt will perform UB dressing with min A overall.  Skilled Therapeutic Interventions/Progress Updates:    Pt greeted in bed, trying to communicate with OT but with garbled speech. Still minimal eye opening but pt appeared alert. Supine<sit completed with Total A, though pt was visibly initiating trunk elevation and scooting forward towards EOB. Max A for sit<stand in bariatric Stedy and then pt transferred to the [facility]. Worked on trunk control and cervical extension while she sat to void. Manual cuing for neutral upright alignment due to Rt lean and flexed posture. Manual cuing for forward gaze as well due to neck flexion. Had her maintain B grip on Stedy bar with pt actively trying to correct posture when provided vcs. She had a large incontinent BM in brief was able to continue moving bowels while on the [facility]. Pt at times initiating lifting bottom off of BSC for partial squatted position to increase ease of void (with steady assist for balance). +2 for hygiene and brief change with Max A for sit<stands in [place]. During standing for hygiene pt at times desatted to low 80s and when this happened she was returned to sitting until sats increased >90s again. For all other aspects of session 02 sats were >92% on 4L via continuous 02 monitor. Pt also initiated elevating Lt limbs to assist OT with bathing/dressing tasks with vcs. Increased tone noted in Rt hand, increased assist for managing Rt limbs. +2 assist for returning pt to bed using Stedy. She was left in bed with all needs within reach, [facility]  raised to 30 degrees, bed alarm set, and B hand mitts donned. Tx focus placed on alertness, OOB tolerance, sitting balance, and ADL retraining.   Therapy Documentation Precautions:  Precautions Precautions: Fall Precaution Comments: cortrak Restrictions Weight Bearing Restrictions: No Vital Signs: Therapy Vitals BP: (!) 148/89 Patient Position (if appropriate): Orthostatic Vitals Oxygen Therapy SpO2: 92 % O2 Device: Nasal Cannula O2 Flow Rate (L/min): 4 L/min Pain: No s/s pain during tx Pain Assessment Pain Scale: 0-10 Pain Score: Asleep ADL:       Therapy/Group: Individual Therapy  Coden Franchi A Armari Fussell 09/20/2019, 12:36 PM

## 2019-09-20 NOTE — IPOC Note (Addendum)
Overall Plan of Care [facility]) Patient Details Name: April Le MRN: [id number] DOB: 15-Oct-1941  Admitting Diagnosis: Left middle cerebral artery stroke [facility])  Hospital Problems: Principal Problem:   Left middle cerebral artery stroke [facility])     Functional Problem List: Nursing Bladder  PT Balance, Behavior, Edema, Endurance, Motor, Nutrition, Pain, Perception, Safety, Sensory, Skin Integrity  OT Balance, Cognition, Endurance, Pain, Safety, Motor, Vision, Perception  SLP Cognition  TR         Basic ADL's: OT Grooming, Bathing, Dressing, Toileting     Advanced  ADL's: OT       Transfers: PT Bed Mobility, Car, Other (comment), Furniture, Floor, Bed to Chair  OT Toilet, Tub/Shower     Locomotion: PT Ambulation, Emergency planning/management officer, Stairs     Additional Impairments: OT Fuctional Use of Upper Extremity  SLP Swallowing, Communication, Social Cognition comprehension, expression Attention  TR      Anticipated Outcomes Item Anticipated Outcome  Self Feeding supervision  Swallowing  Supervision A   Basic self-care  supervision  Toileting  supervision   Bathroom Transfers supervision  Bowel/Bladder  Pt will manage bowel and bladder with min assist  Transfers  Min assist with LRAD  Locomotion  ambulatory for short distances with min assist and LRAD  Communication  Min A  Cognition  Supervison A sustained attention  Pain  Pt will manage pain at 3 or less on a scale of 0-10.  Safety/Judgment  Pt will remain free of falls with injury with min assist   Therapy Plan: PT Intensity: Minimum of 1-2 x/day ,45 to 90 minutes PT Frequency: 5 out of 7 days PT Duration Estimated Length of Stay: 3-3.5 weeks OT Intensity: Minimum of 1-2 x/day, 45 to 90 minutes OT Frequency: 5 out of 7 days OT Duration/Estimated Length of Stay: 18-21 days SLP Intensity: Minumum of 1-2 x/day, 30 to 90 minutes SLP Frequency: 3 to 5 out of 7 days SLP Duration/Estimated Length of Stay: 2.5-3  weeks   Due to the current state of emergency, patients may not be receiving their 3-hours of Medicare-mandated therapy.   Team Interventions: Nursing Interventions Patient/Family Education, Bladder Management, Bowel Management, Medication Management, Pain Management  PT interventions Ambulation/gait training, Cognitive remediation/compensation, Training and development officer, Disease management/prevention, Community reintegration, DME/adaptive equipment instruction, Discharge planning, Patient/family education, Functional mobility training, Functional electrical stimulation, Pain management, Neuromuscular re-education, Psychosocial support, Splinting/orthotics, Skin care/wound management, Therapeutic Exercise, Therapeutic Activities, UE/LE Coordination activities, Stair training, UE/LE Strength taining/ROM, Visual/perceptual remediation/compensation, Wheelchair propulsion/positioning  OT Interventions Balance/vestibular training, Self Care/advanced ADL retraining, Therapeutic Exercise, Cognitive remediation/compensation, DME/adaptive equipment instruction, Pain management, Skin care/wound managment, Neuromuscular re-education, Wheelchair propulsion/positioning, UE/LE Strength taining/ROM, Community reintegration, Technical sales engineer stimulation, Patient/family education, UE/LE Coordination activities, Discharge planning, Functional mobility training, Psychosocial support, Therapeutic Activities  SLP Interventions Cognitive remediation/compensation, Cueing hierarchy, Dysphagia/aspiration precaution training, Functional tasks, Internal/external aids, Patient/family education, Speech/Language facilitation  TR Interventions    SW/CM Interventions Discharge Planning, Psychosocial Support, Patient/Family Education   Barriers to Discharge MD  Medical stability  Nursing      PT Insurance for SNF coverage, Medical stability    OT (none known at this time)    SLP      SW       Team Discharge  Planning: Destination: PT-Home ,OT-   , SLP-Home Projected Follow-up: PT-Skilled nursing facility, Home health PT, OT-  Home health OT, 24 hour supervision/assistance, SLP-Home Health SLP, 24 hour supervision/assistance Projected Equipment Needs: PT-To be determined, OT- None recommended by OT, SLP-None recommended by  SLP Equipment Details: PT- , OT-  Patient/family involved in discharge planning: PT- Patient, Family member/caregiver,  OT-Patient, Family member/caregiver, SLP-Patient, Family member/caregiver  MD ELOS: 18-21d Medical Rehab Prognosis:  Fair Assessment:  78 year old right-handed female with history of 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 Per chart review patient lives with spouse. Two-level home with bed and bath on main level. Independent with assistive device. Patient prefers to take sponge baths. Presented 09/11/2019 nonverbal with right side weakness. SARS Covid negative, creatinine 1.25, hemoglobin 11.8, WBC 10,700. Cranial CT scan showed no hemorrhage or visible acute infarction. Patient did not receive TPA. CT angiogram of head and neck showed left distal M1 embolism extending into proximal M2 branches with poorly enhancing downstream vessels. Remote moderate left lateral frontal and upper insular infarction. CT perfusion showed a 38 cc acute infarct and 56 cc of superimposed penumbra. Underwent cerebral angiogram with complete revascularization of occluded Lt LMCA M1 segment. MRI follow-up showed acute cortical infarct involving majority of left MCA distribution. There was patchy petechial hemorrhage. Echocardiogram with ejection fraction of 65%. Lower extremity Dopplers negative for DVT. Neurology follow-up currently on aspirin 325 mg daily as well as Plavix. Loop recorder has been placed. Patient remains on Lovenox  for DVT prophylaxis. Patient is currently n.p.o. with nasogastric tube feeds. Patient has had episodes of sundowning maintained on Aricept as well as Seroquel. Patient with episode of shortness of breath respiratory 26 oxygen saturation 97% 09/17/2019 there was some copious secretions.chest x-ray completed showing no active disease patient did receive an extra dose of Lasix and troponin was negative. Therapy evaluations completed and patient was admitted for a comprehensive rehab program  Pt has mittens that were placed today, per NT because she pulled out her NGT last night requiring a Coretrak, and kept attempting to pull out IVs    Now requiring 24/7 Rehab RN,MD, as well as CIR level PT, OT and SLP.  Treatment team will focus on ADLs and mobility with goals set at [facility]   See Team Conference Notes for weekly updates to the plan of care

## 2019-09-20 NOTE — Progress Notes (Signed)
Speech Language Pathology Daily Session Note  Patient Details  Name: April Le MRN: [id number] Date of Birth: [birth year]  Today's Date: 09/20/2019 SLP Individual Time: 1400-1425 SLP Individual Time Calculation (min): 25 min  Short Term Goals: Week 1: SLP Short Term Goal 1 (Week 1): Pt will consume thin trials (Ice chips) with minimal overt s/s aspiration to indicate readiness for instrumental assessment. SLP Short Term Goal 2 (Week 1): Pt will demonstrate sustained attention in 1 minute interval with mod A verbal cues for redirection. SLP Short Term Goal 3 (Week 1): Pt will express wants/needs via multimodal communciation with max A verbal/visual cues. SLP Short Term Goal 4 (Week 1): Pt will follow 1 step commands with max A verbal/visual cues in 3 out 5 opportunties. SLP Short Term Goal 5 (Week 1): Pt will produce automatic language sequences with 30% accuracy.  Skilled Therapeutic Interventions: Skilled ST services focused on cognitive skills. Pt was in bed with O2 nasal canula in place and eyes slightly open upon entering room. Pt was only able to produce motsly non-speech sounds, with occasion "yeah" in response to yes/no questions and basic commands. SLP assisted NT in bed mobility and changing of brief and sheets, pt required total A. Pt became more fatigued and was unable to open eyes even slightly given max A verbal/tatcile cues. Pt was unable to follow 1 step commands given max A tactile and demonstration cues, nor response to yes/no questions or repeat at sound level. Pt missed 25 minutes of skilled ST services. Pt was left in room with NT, call bell within reach and bed alarm set. ST recommends to continue skilled ST services.      Pain Pain Assessment Pain Scale: 0-10 Pain Score: 0-No pain  Therapy/Group: Individual Therapy  Mell Mellott  [facility] 09/20/2019, 2:31 PM

## 2019-09-21 ENCOUNTER — Inpatient Hospital Stay (HOSPITAL_COMMUNITY): Payer: Medicare Other | Admitting: Physical Therapy

## 2019-09-21 ENCOUNTER — Inpatient Hospital Stay (HOSPITAL_COMMUNITY): Payer: Medicare Other | Admitting: Occupational Therapy

## 2019-09-21 NOTE — Progress Notes (Signed)
Physical Therapy Session Note  Patient Details  Name: April Le MRN: [id number] Date of Birth: 1941-02-26  Today's Date: 09/21/2019 PT Individual Time: 0900-1015 AND 1415-1500 PT Individual Time Calculation (min): 75 min and 45 min   Short Term Goals: Week 1:  PT Short Term Goal 1 (Week 1): Pt will perform bed mobility with mod assist PT Short Term Goal 2 (Week 1): Pt will transfer to [facility] with mod assist PT Short Term Goal 3 (Week 1): Pt will remain OOB up to 2 hours  Skilled Therapeutic Interventions/Progress Updates:  Session 1  Pt received supine in bed and agreeable to PT, RN present administering medication via NG tube. Supine>sit transfer with max assist to the L. With max facilitation to come to EOB.   Sit<>stand with RW x 2 with max assist to initiate movement. stedy transfer to [facility] Iv with mod assist and max cues for initiation of movement.   Kinetron reciprocal movement training 3 x10 BLE max cues for full ROM and attention to RLE to engage extension in paretic LE. Pt noted to have instances of decreased responsiveness while on Kinetron, but improved in <1 min each time.   Sit<>stand from [facility] with RW mod-max assist from PT and max multimodal cues for erect cervical and thoracic posture. Pt unable to take steps due to fatigue.   Pt returned to room and performed stedy transfer to bed with mod assist. Sit>supine completed with mod assist, and left supine in bed with call bell in reach and all needs met.   Session 2.   Pt received supine in bed and agreeable to PT. Supine>sit transfer with min-mod assist and significantly improved arousal and motor planning from AM session. Sit<>stand x 2 with min-mod assist from EOB and 1 UE support from PT on the L.  Stand pivot transfer to the [facility] with moderate A from PT for weight shifting with Hand hold on the L. Pt transported to rehab gym in [facility]. Sit<>stand from [facility] with min-mod assist, once Pt able to initiate movement. Gait training with mod  assist and +2 for WC and IV pole follow. Completed 2 bouts x 18f; noted to have adduction on the RLE with mild hip instability and intermittent R knee instability, but no LOB.   Pt returned to room and performed stand pivot transfer to bed with mod assist and Hand hond on the L. Sit>supine completed with mod assist through long sitting technique, and left supine in bed with call bell in reach and all needs met.         Therapy Documentation Precautions:  Precautions Precautions: Fall Precaution Comments: cortrak Restrictions Weight Bearing Restrictions: No Pain: denies   Therapy/Group: Individual Therapy  ALorie Phenix11/05/2019, 10:49 AM

## 2019-09-21 NOTE — Plan of Care (Signed)
  Problem: RH BOWEL ELIMINATION Goal: RH STG MANAGE BOWEL WITH ASSISTANCE Description: STG Manage Bowel with  Min Assistance. Outcome: Progressing   

## 2019-09-21 NOTE — Progress Notes (Signed)
Occupational Therapy Session Note  Patient Details  Name: April Le MRN: [id number] Date of Birth: [birth year]  Today's Date: 09/21/2019 OT Individual Time: 1101-1201 OT Individual Time Calculation (min): 60 min   Short Term Goals: Week 1:  OT Short Term Goal 1 (Week 1): Pt will perform mod A for toileting. OT Short Term Goal 2 (Week 1): Pt will perform Mod A for bathing tasks at sink. OT Short Term Goal 3 (Week 1): Pt will perform UB dressing with min A overall.  Skilled Therapeutic Interventions/Progress Updates:    Pt greeted in bed with family present. Appearing more alert, bright in affect. Supine<sit completed with Mod A and pt actively assisting/initiating during movement. Sit<stand in bariatric Stedy completed with Mod A with facilitation for B UE placement on the bar. She transferred to the [facility] where she resumed with bathing/dressing tasks at sit<stand level using Stedy. Tx focus placed on neutral postural alignment, maintaining alertness, and following 1 step instruction. Multimodal cuing to correct Rt lean in sitting, standing, and supported standing positions. HOH to wash her face. Difficult to facilitate [facility] for washing other body areas due to L UE rigidity when flexed or repositioned. Pt able to lift Lt limbs when cued, assist required for the Rt side. +2 assist for thorough perihygiene post large incontinent BM in brief. A lot had gotten on her upper legs. 1 assist for providing Min-Mod A for balance while 2nd helper completed pericare. Pt then transferred back to bed with 2 assist and was boosted up. Left her with B hand mitts, soft call bell in hand, bed alarm set, and HOB raised to 30 degrees.   02 sats on 3L remained >92% throughout session using portable 02 tank.   Pt did not actively void while sitting on the [facility] Therapy Documentation Precautions:  Precautions Precautions: Fall Precaution Comments: cortrak Restrictions Weight Bearing Restrictions: No Pain: No s/s pain  during tx   ADL:       Therapy/Group: Individual Therapy  Jos Cygan A Jamoni Hewes 09/21/2019, 12:37 PM

## 2019-09-22 ENCOUNTER — Inpatient Hospital Stay (HOSPITAL_COMMUNITY): Payer: Medicare Other | Admitting: Speech Pathology

## 2019-09-22 NOTE — Progress Notes (Signed)
Speech Language Pathology Daily Session Note  Patient Details  Name: April Le MRN: [id number] Date of Birth: September 24, 1941  Today's Date: 09/22/2019 SLP Individual Time: [id number] SLP Individual Time Calculation (min): 42 min  Short Term Goals: Week 1: SLP Short Term Goal 1 (Week 1): Pt will consume thin trials (Ice chips) with minimal overt s/s aspiration to indicate readiness for instrumental assessment. SLP Short Term Goal 2 (Week 1): Pt will demonstrate sustained attention in 1 minute interval with mod A verbal cues for redirection. SLP Short Term Goal 3 (Week 1): Pt will express wants/needs via multimodal communciation with max A verbal/visual cues. SLP Short Term Goal 4 (Week 1): Pt will follow 1 step commands with max A verbal/visual cues in 3 out 5 opportunties. SLP Short Term Goal 5 (Week 1): Pt will produce automatic language sequences with 30% accuracy.  Skilled Therapeutic Interventions: Pt was seen for skilled ST targeting dysphagia and communication goals. Following thorough oral care via toothbrush with suction (completed by SLP-Total A), SLP facilitated session with PO trials. Pt exhibited immediate cough X1 and delayed throat clear X1 throughout intake of ~15 ice chip trials. Although difficult to fully assess at bedside, pt's initiation of the swallow sequence appeared to become more timely as trials progress (given bedside observation and palpation). Of note, pt was unable to trigger volitional swallow or cough during session. Pt demonstrated intermittent ability to answer yes/no questions regarding basic wants and needs, with most vocalizations consisting of "mhm". Pt very clearly verbalized "yes" X1 and "no" X1. Pt approximated, "good" and "more" during ice chip trials. She frequently substituted the phoneme "m" at the beginning of vocalizations/approximations of words. Pt was left laying semi-reclined in bed with alarm set and all needs within reach. Continue per current plan of  care.       Pain Pain Assessment Pain Scale: Faces Faces Pain Scale: No hurt  Therapy/Group: Individual Therapy  [name] 09/22/2019, 8:59 AM

## 2019-09-22 NOTE — Progress Notes (Signed)
[place] PHYSICAL MEDICINE & REHABILITATION PROGRESS NOTE   Subjective/Complaints:  Appreciate neuro note, no further seizures noted by staff, patient was alert and participating well in therapy yesterday.  Daughter noted 1 brief episode where patient was shaking "a little bit and stared off" ALert , eyes open , remains globally aphasic  ROS- limited by cognition   Objective:   No results found. Recent Labs    09/20/19 0620  WBC 7.9  HGB 11.2*  HCT 33.9*  PLT 199   Recent Labs    09/20/19 0620  NA 136  K 4.4  CL 100  CO2 26  GLUCOSE 156*  BUN 23  CREATININE 0.91  CALCIUM 8.6*    Intake/Output Summary (Last 24 hours) at 09/22/2019 1127 Last data filed at 09/21/2019 1839 Gross per 24 hour  Intake 870 ml  Output -  Net 870 ml     Physical Exam: Vital Signs Blood pressure (!) 152/90, pulse 63, temperature 98.4 F (36.9 C), temperature source Oral, resp. rate 18, height 5' (1.524 m), weight 69 kg, SpO2 95 %.   General: No acute distress Mood and affect are appropriate Heart: Regular rate and rhythm no rubs murmurs or extra sounds Lungs: Clear to auscultation, breathing unlabored, no rales or wheezes Abdomen: Positive bowel sounds, soft nontender to palpation, nondistended Extremities: No clubbing, cyanosis, or edema Skin: No evidence of breakdown, no evidence of rash Neurologic: cannot cooperate with exam, withdraws to pinch in all 4 limbs 4/5 in RUE and RLE (formal testing difficult due to problems following commands)  Cerebellar exam cannot copperate Musculoskeletal: Full range of motion in all 4 extremities. No joint swelling   Assessment/Plan: 1. 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  Physiatrist is providing close team supervision and 24 hour management of active medical problems listed below.  Physiatrist and rehab team continue to  assess barriers to discharge/monitor patient progress toward functional and medical goals  Care Tool:  Bathing    Body parts bathed by patient: Face   Body parts bathed by helper: Right arm, Left arm, Chest, Front perineal area, Buttocks, Right lower leg, Left lower leg, Face, Right upper leg, Left upper leg, Abdomen     Bathing assist Assist Level: 2 Helpers     Upper Body Dressing/Undressing Upper body dressing   What is the patient wearing?: Hospital gown only    Upper body assist Assist Level: Total Assistance - Patient < 25%    Lower Body Dressing/Undressing Lower body dressing      What is the patient wearing?: Incontinence brief     Lower body assist Assist for lower body dressing: 2 Helpers(sit<stand in [place])     Toileting Toileting    Toileting assist Assist for toileting: 2 Helpers     Transfers Chair/bed transfer  Transfers assist  Chair/bed transfer activity did not occur: Safety/medical concerns  Chair/bed transfer assist level: Moderate Assistance - Patient 50 - 74%     Locomotion Ambulation   Ambulation assist   Ambulation activity did not occur: Safety/medical concerns  Assist level: Moderate Assistance - Patient 50 - 74% Assistive device: Walker-rolling Max distance: 10'   Walk 10 feet activity   Assist  Walk 10 feet activity did not occur: Safety/medical concerns        Walk 50 feet activity   Assist Walk 50 feet with 2 turns activity did not occur: Safety/medical concerns  Walk 150 feet activity   Assist Walk 150 feet activity did not occur: Safety/medical concerns         Walk 10 feet on uneven surface  activity   Assist Walk 10 feet on uneven surfaces activity did not occur: Safety/medical concerns         Wheelchair     Assist Will patient use wheelchair at discharge?: Yes   Wheelchair activity did not occur: Safety/medical concerns         Wheelchair 50 feet with 2 turns  activity    Assist    Wheelchair 50 feet with 2 turns activity did not occur: Safety/medical concerns       Wheelchair 150 feet activity     Assist  Wheelchair 150 feet activity did not occur: Safety/medical concerns       Blood pressure (!) 152/90, pulse 63, temperature 98.4 F (36.9 C), temperature source Oral, resp. rate 18, height 5' (1.524 m), weight 69 kg, SpO2 95 %.  Medical Problem List and Plan: 1.  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  Status post loop recorder placement CIR PT OT speech Discussed swallowing status with patient, husband and daughter.  The patient will need another swallowing test and if she makes some improvements may start p.o. and avoid PEG tube.  If there is no improvement may need to pursue PEG 2.  Antithrombotics: -DVT/anticoagulation: Lovenox.             -antiplatelet therapy: Aspirin 325 mg daily, Plavix 75 mg daily- CT head no new bleed cont current antiplt agents 3. Pain Management: Tylenol as needed 4. Mood: Aricept 5 mg daily, Seroquel 25 mg nightly             -antipsychotic agents: seroquel QHS for sundowning 5. Neuropsych: This patient is not capable of making decisions on her own behalf. 6. Skin/Wound Care: Routine skin checks 7. Fluids/Electrolytes/Nutrition: Routine in and outs with follow-up chemistries 8.  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  Monitor for any signs of fluid overload.  Lasix 40 mg twice daily Monitor daily weights 10.  Dysphagia.  Nasogastric tube feeds; is NPO- if Coretrak needs to come out due to d/c, will need to place PEG.  Follow-up speech therapy 10.  CKD stage III.  Follow-up chemistries 11.  Hypertension.  Lopressor 50 mg twice daily.  Monitor with increased  mobility Vitals:   09/21/19 2027 09/22/19 0506  BP: (!) 149/60 (!) 152/90  Pulse: 65 63  Resp: 18 18  Temp: 98 F (36.7 C) 98.4 F (36.9 C)  SpO2:  99991111  systolic hypertension permissive for now, overall blood pressures are improving.  Pulse is on low side would not increase the Lopressor at this point 12.  Hypothyroidism.  Synthroid 13.  Hyperlipidemia.  Lipitor 14. Aphasia- global vs receptive aphasia with confusion- will have pt evaluated by SLP- might benefit from Amantadine? 15. Low grade temp-resolved without recurrence 16.  Morbid obesity BMI 29.7 17. Post stroke seizure complex partial discussed with PT who witnessed RUE flexion and shaking, R LE ext and shaking, head deviation to the Right  Received Ativan , on Keppra 500mg  BID as per Neuro, neuro has signed off but they will need to see her as an outpatient, consider switch to Keppra via tube this week 18.  OSA CPAP with oxygen at home. LOS: 5 days A FACE TO FACE EVALUATION WAS PERFORMED  Charlett Blake 09/22/2019, 11:27 AM

## 2019-09-23 ENCOUNTER — Inpatient Hospital Stay (HOSPITAL_COMMUNITY): Payer: Medicare Other

## 2019-09-23 ENCOUNTER — Inpatient Hospital Stay (HOSPITAL_COMMUNITY): Payer: Medicare Other | Admitting: Occupational Therapy

## 2019-09-23 ENCOUNTER — Inpatient Hospital Stay (HOSPITAL_COMMUNITY): Payer: Medicare Other | Admitting: Speech Pathology

## 2019-09-23 ENCOUNTER — Inpatient Hospital Stay (HOSPITAL_COMMUNITY): Payer: Medicare Other | Admitting: Physical Therapy

## 2019-09-23 DIAGNOSIS — R2689 Other abnormalities of gait and mobility: Secondary | ICD-10-CM

## 2019-09-23 DIAGNOSIS — T801XXA Vascular complications following infusion, transfusion and therapeutic injection, initial encounter: Secondary | ICD-10-CM

## 2019-09-23 MED ORDER — LEVETIRACETAM 100 MG/ML PO SOLN
500.0000 mg | Freq: Two times a day (BID) | ORAL | Status: DC
  Administered 2019-09-23 – 2019-09-25 (×5): 500 mg
  Filled 2019-09-23 (×5): qty 5

## 2019-09-23 NOTE — Progress Notes (Addendum)
[facility] PHYSICAL MEDICINE & REHABILITATION PROGRESS NOTE   Subjective/Complaints:  HR dropped to 30s this morning, as per therapist at bedside patient did not sleep well this morning because of a lot of staff attending her this morning for her bradycardia. Now sitting at bedside with PT her HR is 62. IV appears to have infiltrated on her left arm and patient is complaining of some pain there.   ROS- limited by cognition   Objective:   No results found. No results for input(s): WBC, HGB, HCT, PLT in the last 72 hours. No results for input(s): NA, K, CL, CO2, GLUCOSE, BUN, CREATININE, CALCIUM in the last 72 hours.  Intake/Output Summary (Last 24 hours) at 09/23/2019 0913 Last data filed at 09/23/2019 0820 Gross per 24 hour  Intake 0 ml  Output -  Net 0 ml     Physical Exam: Vital Signs Blood pressure (!) 154/61, pulse 70, temperature 98.2 F (36.8 C), temperature source Oral, resp. rate 18, height 5' (1.524 m), weight 69.6 kg, SpO2 94 %.   General: No acute distress Mood and affect are appropriate Heart: Regular rate and rhythm no rubs murmurs or extra sounds Lungs: Clear to auscultation, breathing unlabored, no rales or wheezes Abdomen: Positive bowel sounds, soft nontender to palpation, nondistended Extremities: No clubbing, cyanosis. Left arm with mild swelling and redness, with warmth and tenderness compared to right arm.   Skin: No evidence of breakdown, no evidence of rash Neurologic: cannot cooperate with exam, withdraws to pinch in all 4 limbs 4/5 in RUE and RLE (formal testing difficult due to problems following commands)  Cerebellar exam cannot copperate Musculoskeletal: Full range of motion in all 4 extremities. No joint swelling   Assessment/Plan: 1. 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  Physiatrist is providing close team supervision and 24  hour management of active medical problems listed below.  Physiatrist and rehab team continue to assess barriers to discharge/monitor patient progress toward functional and medical goals  Care Tool:  Bathing    Body parts bathed by patient: Face   Body parts bathed by helper: Right arm, Left arm, Chest, Front perineal area, Buttocks, Right lower leg, Left lower leg, Face, Right upper leg, Left upper leg, Abdomen     Bathing assist Assist Level: 2 Helpers     Upper Body Dressing/Undressing Upper body dressing   What is the patient wearing?: Hospital gown only    Upper body assist Assist Level: Total Assistance - Patient < 25%    Lower Body Dressing/Undressing Lower body dressing      What is the patient wearing?: Incontinence brief     Lower body assist Assist for lower body dressing: 2 Helpers(sit<stand in [place])     Toileting Toileting    Toileting assist Assist for toileting: 2 Helpers     Transfers Chair/bed transfer  Transfers assist  Chair/bed transfer activity did not occur: Safety/medical concerns  Chair/bed transfer assist level: Moderate Assistance - Patient 50 - 74%     Locomotion Ambulation   Ambulation assist   Ambulation activity did not occur: Safety/medical concerns  Assist level: Moderate Assistance - Patient 50 - 74% Assistive device: Walker-rolling Max distance: 10'   Walk 10 feet activity   Assist  Walk 10 feet activity did not occur: Safety/medical concerns        Walk 50 feet activity   Assist Walk 50 feet with  2 turns activity did not occur: Safety/medical concerns         Walk 150 feet activity   Assist Walk 150 feet activity did not occur: Safety/medical concerns         Walk 10 feet on uneven surface  activity   Assist Walk 10 feet on uneven surfaces activity did not occur: Safety/medical concerns         Wheelchair     Assist Will patient use wheelchair at discharge?: Yes   Wheelchair  activity did not occur: Safety/medical concerns         Wheelchair 50 feet with 2 turns activity    Assist    Wheelchair 50 feet with 2 turns activity did not occur: Safety/medical concerns       Wheelchair 150 feet activity     Assist  Wheelchair 150 feet activity did not occur: Safety/medical concerns       Blood pressure (!) 154/61, pulse 70, temperature 98.2 F (36.8 C), temperature source Oral, resp. rate 18, height 5' (1.524 m), weight 69.6 kg, SpO2 94 %.  Medical Problem List and Plan: 1.  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  Status post loop recorder placement CIR PT OT speech Discussed swallowing status with patient, husband and daughter.  The patient will need another swallowing test and if she makes some improvements may start p.o. and avoid PEG tube.  If there is no improvement may need to pursue PEG 2.  Antithrombotics: -DVT/anticoagulation: Lovenox.             -antiplatelet therapy: Aspirin 325 mg daily, Plavix 75 mg daily- CT head no new bleed cont current antiplt agents 3. Pain Management: Tylenol as needed 4. Mood: Aricept 5 mg daily, Seroquel 25 mg nightly             -antipsychotic agents: seroquel QHS for sundowning 5. Neuropsych: This patient is not capable of making decisions on her own behalf. 6. Skin/Wound Care: Routine skin checks 7. Fluids/Electrolytes/Nutrition: Routine in and outs with follow-up chemistries 8.  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  Monitor for any signs of fluid overload.  Lasix 40 mg twice daily Monitor daily weights 10.  Dysphagia.  Nasogastric tube feeds; is NPO- if [place] needs to come out due to d/c, will need to place PEG.  Follow-up speech therapy 10.  CKD stage III.   Follow-up chemistries 11.  Hypertension.  Lopressor 50 mg twice daily.  Monitor with increased mobility Vitals:   09/23/19 0355 09/23/19 0742  BP: (!) 140/57 (!) 154/61  Pulse: 65 70  Resp: 18   Temp: 98.2 F (36.8 C)   SpO2: [id number]   systolic hypertension permissive for now, overall blood pressures are improving.  Pulse is on low side would not increase the Lopressor at this point 12.  Hypothyroidism.  Synthroid 13.  Hyperlipidemia.  Lipitor 14. Aphasia- global vs receptive aphasia with confusion- will have pt evaluated by SLP- might benefit from Amantadine? 15. Low grade temp-resolved without recurrence 16.  Morbid obesity BMI 29.7 17. Post stroke seizure complex partial discussed with PT who witnessed RUE flexion and shaking, R LE ext and shaking, head deviation to the Right  Received Ativan , on Keppra 500mg  BID as per Neuro, neuro has signed off but they will need to see  her as an outpatient, consider switch to Keppra via tube this week 18.  OSA CPAP with oxygen at home. 19. Left arm IV infiltrated--will alleviate pain with warm packs 20. Bradycardia more stable at this time, while sitting up and working with PT. Continue to monitor closely.  LOS: 6 days A FACE TO FACE EVALUATION WAS PERFORMED  Clide Deutscher Paulkar 09/23/2019, 9:13 AM

## 2019-09-23 NOTE — Progress Notes (Signed)
Physical Therapy Session Note  Patient Details  Name: April Le MRN: [id number] Date of Birth: 1941-06-11  Today's Date: 09/23/2019 PT Individual Time: 0800-0900 PT Individual Time Calculation (min): 60 min   Short Term Goals: Week 1:  PT Short Term Goal 1 (Week 1): Pt will perform bed mobility with mod assist PT Short Term Goal 2 (Week 1): Pt will transfer to [facility] with mod assist PT Short Term Goal 3 (Week 1): Pt will remain OOB up to 2 hours  Skilled Therapeutic Interventions/Progress Updates:     Patient in bed on 2L/min O2 with RN in room upon PT arrival. Patient intermittently alert due to lethargy this morning and agreeable to PT session. Patient stated yes when asked about pain during session and indicated her L arm which appeared swollen and warm to touch, RN aware. PT provided repositioning, rest breaks, and distraction as pain interventions throughout session. Vitals in supine: BP 169/6,8 HR 68, SPO2 93% on 2L/min O2.  Therapeutic Activity: Bed Mobility: Patient performed supine to sit with mod A with HOB elevated to 70 degrees due to lethargy. Provided verbal cues for sliding LEs off the bed and pushing to sit up. Patient able to initiate moving B LEs intermittently following simple cues and participated in sitting up. She performed scooting on EOB with mod A for both hips x3, able to follow intermittent cues for leaning and scooting.  Transfers: Patient performed stand pivot from bed to w/c x1 with mod A with L HHA. Patient paused during transfer and was non-responsive to cues to let go of the bed rail, did respond "yes" when ask if she was okay, but had difficulty with motor planning to let go of the rail and sit down. She performed sit to/from stand x1 with mod A with B HHA. Provided verbal cues for leaning forward and pushing up to stand.  Gait Training:  Patient ambulated 5 feet using B HHA with mod A and significantly increased time due to poor motor planning and sequencing.  Ambulated with very small step length, delayed initiation L>R, forward flexed posture, and increased B knee flextion. Provided verbal cues for sequencing, inititiation of stepping L>R. Performed a 180 degree turn while ambulating to sit in the recliner in the room with heavy cues for sequencing.   Neuromuscular Re-ed: Patient performed sitting balance on EOB x12 min progressing from mod A to close supervision. Noted improved arousal in sitting and patient was able to answer simple "yes" or "no" questions verbally and appropriately in sitting, voice was very soft with each answer. She also performed reaching completing 2/5 reaches to a visual targe with B UEs.   Patient in recliner alert in the room at end of session, RN made aware, with breaks locked, chair alarm set, and all needs within reach. HR and O2 remained WFL throughout session with patient on 2L/min O2.   Therapy Documentation Precautions:  Precautions Precautions: Fall Precaution Comments: cortrak Restrictions Weight Bearing Restrictions: No    Therapy/Group: Individual Therapy  Laurabelle Gorczyca L Masen Salvas PT, DPT  09/23/2019, 12:36 PM

## 2019-09-23 NOTE — Progress Notes (Addendum)
Physical Therapy Session Note  Patient Details  Name: April Le MRN: [id number] Date of Birth: [birth year]  Today's Date: 09/23/2019 PT Individual Time: [id number] PT Individual Time Calculation (min): 23 min   Short Term Goals: Week 1:  PT Short Term Goal 1 (Week 1): Pt will perform bed mobility with mod assist PT Short Term Goal 2 (Week 1): Pt will transfer to [facility] with mod assist PT Short Term Goal 3 (Week 1): Pt will remain OOB up to 2 hours  Skilled Therapeutic Interventions/Progress Updates:  Pt received in room with husband present (visitor had mask off, but quickly donned it once therapist entered room). No behaviors demonstrating pain during session. Pt demonstrates impaired cognition & R inattention throughout session as she requires max cuing to locate therapist & come to sitting to R side of bed. Pt requires mod<>max assist for supine<>sit and tolerates sitting EOB ~10-12 minutes with min assist with max multimodal cuing to correct posterior lean. Pt engaged in reaching outside of BOS to L with RUE with focus on dynamic sitting balance. Pt's HR dropped to lowest of 37 bpm while sitting EOB & RN made aware & assessed pt. Pt returned to bed 2/2 low HR (RN to notify PA) & assisted with scooting to [facility]. Pt left in bed with alarm set & pancake call bell in lap, BUE mittens donned, & husband present in room.   Therapy Documentation Precautions:  Precautions Precautions: Fall Precaution Comments: cortrak Restrictions Weight Bearing Restrictions: No  Vital Signs: BP = 145/59 mmHg (RUE, sitting EOB) HR = 37-78 bpm    Therapy/Group: Individual Therapy  Waunita Schooner 09/23/2019, 3:46 PM

## 2019-09-23 NOTE — Progress Notes (Signed)
Occupational Therapy Session Note  Patient Details  Name: April Le MRN: [id number] Date of Birth: 1941/05/19  Today's Date: 09/23/2019 OT Individual Time: [id number] OT Individual Time Calculation (min): 55 min   Short Term Goals: Week 1:  OT Short Term Goal 1 (Week 1): Pt will perform mod A for toileting. OT Short Term Goal 2 (Week 1): Pt will perform Mod A for bathing tasks at sink. OT Short Term Goal 3 (Week 1): Pt will perform UB dressing with min A overall.  Skilled Therapeutic Interventions/Progress Updates:    Pt greeted in the recliner with no s/s pain. Started session with going over family pictures, presenting each one at midline or towards Rt of midline. Pt able state "me" and "usband" (husband) in one photo, however unable to identify herself in another one. Unable to identify dtr or great-grandchildren via nods/head shakes. Afterwards had pt select clothing items to wear for the day. She reached out and touched one shirt and one pair of pants when given options of 2. When presented with [facility] for sit<stand (to change brief if needed), pt shaking head and verbalizing "no." Also swatting clothing items away. Therefore transitioned to grooming tasks using lotion that family brought from home. Pt resistant to [facility], though initiated reaching with and using R UE. When given setup and cuing to wash face, pt passed the cloth between hands and then handed it back to OT. For remainder of session, pt was pulling at various medical lines and needed max cues for redirection. She did initiate leaning forward and doffing Rt and Lt gripper socks. Pt was unable to doff them but visibly fatigued herself. When OT placed NG tube out of reach pt slapped her arm and resisted when OT tried to have her release grip on 02 tubing. RN notified to assist with deescalating pt and returning her safely to bed. +2 assist for [facility] transfer due to behaviors and multiple medical lines. +2 assist for boosting up in bed  and repositioning. B hand mitts donned. Pt left in care of RN for morning medicine at session exit.   Therapy Documentation Precautions:  Precautions Precautions: Fall Precaution Comments: cortrak Restrictions Weight Bearing Restrictions: No Pain: No s/s pain during session    ADL:       Therapy/Group: Individual Therapy  Ashleynicole Mcclees A Princella Jaskiewicz 09/23/2019, 12:45 PM

## 2019-09-23 NOTE — Progress Notes (Signed)
Patient HR in the 40s, no symptom noted, patient is stable. PA notified, no new order given. We continue to monitor.

## 2019-09-23 NOTE — Progress Notes (Addendum)
Patient was alert at the beginning of the shift. Noted periods of bradycardia early in the morning. Pulse rate went down to 36 bpm for a few minutes, then went right back up to normal limits. This happened a few times while resting. She has a loop recorder. Her left forearm is swollen & warm to the touch. She does seem to have pain from it. Communication left for the provider. She is sleeping & has slept throughout the night. Will continue to monitor

## 2019-09-23 NOTE — Progress Notes (Signed)
Speech Language Pathology Daily Session Note  Patient Details  Name: April Le MRN: [id number] Date of Birth: September 03, 1941  Today's Date: 09/23/2019 SLP Individual Time: 1300-1355 SLP Individual Time Calculation (min): 55 min  Short Term Goals: Week 1: SLP Short Term Goal 1 (Week 1): Pt will consume thin trials (Ice chips) with minimal overt s/s aspiration to indicate readiness for instrumental assessment. SLP Short Term Goal 2 (Week 1): Pt will demonstrate sustained attention in 1 minute interval with mod A verbal cues for redirection. SLP Short Term Goal 3 (Week 1): Pt will express wants/needs via multimodal communciation with max A verbal/visual cues. SLP Short Term Goal 4 (Week 1): Pt will follow 1 step commands with max A verbal/visual cues in 3 out 5 opportunties. SLP Short Term Goal 5 (Week 1): Pt will produce automatic language sequences with 30% accuracy.  Skilled Therapeutic Interventions: Pt was seen for skilled ST targeting dysphagia and communication goals. Pt's husband was present and supportive throughout session. SLP assisted NT with bed mobility and brief change, during which pt was unable to follow 1-step directions for repositioning body movements - Total A provided. Once clean and dry, SLP provided thorough oral care with suction. Of note, pt's has thick dried secretions coating her tongue and diligent QID+ oral care is recommended. Pt consumed ~8 ice chips with no immediate overt s/s aspiration. Pt exhibited strong immediate cough with presentation of teaspoon thin H20 and subsequently refused any further PO trials. Recommend continue NPO and SLP will continue to provide trials to assess readiness for instrumental swallow study. Pt was unable to verbalize during automatic speech tasks, however she did vocalize in attempt to hum along to the tune of happy birthday. Pt with intermittent ability to say "yes" "mhm" "no" throughout session in response to questions regarding basic  wants and needs. Pt provided social response "bye" as SLP left the room. Pt was with frequent neologisms throughout session when attempting to generate speech to communicate with SLP and husband. Max A multimodal cues unsuccessful during attempts to get pt to repeat her and her husband's names. Pt left laying in bed with alarm set, needs within reach, and husband still present. Continue per current plan of care.        Pain Pain Assessment Pain Scale: Faces Faces Pain Scale: No hurt  Therapy/Group: Individual Therapy  [name] 09/23/2019, 3:10 PM

## 2019-09-24 ENCOUNTER — Inpatient Hospital Stay (HOSPITAL_COMMUNITY): Payer: Medicare Other | Admitting: Physical Therapy

## 2019-09-24 ENCOUNTER — Inpatient Hospital Stay (HOSPITAL_COMMUNITY): Payer: Medicare Other | Admitting: Occupational Therapy

## 2019-09-24 ENCOUNTER — Inpatient Hospital Stay (HOSPITAL_COMMUNITY): Payer: Medicare Other | Admitting: Speech Pathology

## 2019-09-24 LAB — CREATININE, SERUM
Creatinine, Ser: 0.96 mg/dL (ref 0.44–1.00)
GFR calc Af Amer: >60 mL/min (ref >60)
GFR calc non Af Amer: 56 mL/min — ABNORMAL LOW (ref >60)

## 2019-09-24 NOTE — Progress Notes (Signed)
[place] PHYSICAL MEDICINE & REHABILITATION PROGRESS NOTE   Subjective/Complaints:  No issues overnite per CNA, remains incont Bowel and bladder  ROS- limited by cognition   Objective:   No results found. No results for input(s): WBC, HGB, HCT, PLT in the last 72 hours. Recent Labs    09/24/19 0537  CREATININE 0.96    Intake/Output Summary (Last 24 hours) at 09/24/2019 0755 Last data filed at 09/23/2019 1721 Gross per 24 hour  Intake 0 ml  Output -  Net 0 ml     Physical Exam: Vital Signs Blood pressure (!) 147/51, pulse 66, temperature 98.7 F (37.1 C), temperature source Oral, resp. rate 16, height 5' (1.524 m), weight 69.6 kg, SpO2 99 %.   General: No acute distress Mood and affect are appropriate Heart: Regular rate and rhythm no rubs murmurs or extra sounds Lungs: Clear to auscultation, breathing unlabored, no rales or wheezes Abdomen: Positive bowel sounds, soft nontender to palpation, nondistended Extremities: No clubbing, cyanosis. Left arm with mild swelling and redness, with warmth and tenderness compared to right arm.   Skin: No evidence of breakdown, no evidence of rash Neurologic: cannot cooperate with exam, withdraws to pinch in all 4 limbs 4/5 in RUE and RLE (formal testing difficult due to problems following commands)  Cerebellar exam cannot copperate Musculoskeletal: Full range of motion in all 4 extremities. No joint swelling   Assessment/Plan: 1. 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  Physiatrist is providing close team supervision and 24 hour management of active medical problems listed below.  Physiatrist and rehab team continue to assess barriers to discharge/monitor patient progress toward functional and medical goals  Care Tool:  Bathing    Body parts bathed by patient: Face   Body parts bathed by helper: Right arm,  Left arm, Chest, Front perineal area, Buttocks, Right lower leg, Left lower leg, Face, Right upper leg, Left upper leg, Abdomen     Bathing assist Assist Level: 2 Helpers     Upper Body Dressing/Undressing Upper body dressing Upper body dressing/undressing activity did not occur (including orthotics): Refused What is the patient wearing?: Hospital gown only    Upper body assist Assist Level: Total Assistance - Patient < 25%    Lower Body Dressing/Undressing Lower body dressing    Lower body dressing activity did not occur: Refused What is the patient wearing?: Incontinence brief     Lower body assist Assist for lower body dressing: 2 Helpers(sit<stand in [place])     Toileting Toileting    Toileting assist Assist for toileting: 2 Helpers     Transfers Chair/bed transfer  Transfers assist  Chair/bed transfer activity did not occur: Safety/medical concerns  Chair/bed transfer assist level: Moderate Assistance - Patient 50 - 74%     Locomotion Ambulation   Ambulation assist   Ambulation activity did not occur: Safety/medical concerns  Assist level: Moderate Assistance - Patient 50 - 74% Assistive device: Hand held assist Max distance: 5'   Walk 10 feet activity   Assist  Walk 10 feet activity did not occur: Safety/medical concerns        Walk 50 feet activity   Assist Walk 50 feet with 2 turns activity did not occur: Safety/medical concerns         Walk 150 feet activity   Assist Walk 150 feet activity did not occur: Safety/medical concerns  Walk 10 feet on uneven surface  activity   Assist Walk 10 feet on uneven surfaces activity did not occur: Safety/medical concerns         Wheelchair     Assist Will patient use wheelchair at discharge?: Yes   Wheelchair activity did not occur: Safety/medical concerns         Wheelchair 50 feet with 2 turns activity    Assist    Wheelchair 50 feet with 2 turns activity did  not occur: Safety/medical concerns       Wheelchair 150 feet activity     Assist  Wheelchair 150 feet activity did not occur: Safety/medical concerns       Blood pressure (!) 147/51, pulse 66, temperature 98.7 F (37.1 C), temperature source Oral, resp. rate 16, height 5' (1.524 m), weight 69.6 kg, SpO2 99 %.  Medical Problem List and Plan: 1.  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  Status post loop recorder placement CIR PT OT speech- team conf in am  Discussed swallowing status with patient, husband and daughter.  The patient will need another swallowing test and if she makes some improvements may start p.o. and avoid PEG tube.  If there is no improvement may need to pursue PEG Appreciate SLP note, cough with ice chips yesterday  2.  Antithrombotics: -DVT/anticoagulation: Lovenox.             -antiplatelet therapy: Aspirin 325 mg daily, Plavix 75 mg daily- 3. Pain Management: Tylenol as needed 4. Mood: Aricept 5 mg daily, Seroquel 25 mg nightly             -antipsychotic agents: seroquel QHS for sundowning 5. Neuropsych: This patient is not capable of making decisions on her own behalf. 6. Skin/Wound Care: Routine skin checks 7. Fluids/Electrolytes/Nutrition: Routine in and outs with follow-up chemistries 8.  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  Monitor for any signs of fluid overload.  Lasix 40 mg twice daily Monitor daily weights 10.  Dysphagia.  Nasogastric tube feeds; is NPO- if [place] needs to come out due to d/c, will need to place PEG.  Follow-up speech therapy 10.  CKD stage III.  Follow-up chemistries 11.  Hypertension.  Lopressor 50 mg twice daily.  Monitor with increased mobility Vitals:   09/23/19 2012 09/24/19 0458   BP: (!) 159/65 (!) 147/51  Pulse: 73 66  Resp: 18 16  Temp: 98.7 F (37.1 C) 98.7 F (37.1 C)  SpO2: [id number]  systolic hypertension permissive for now, overall blood pressures are improving.  Pulse is on low side if recurrent may need to adjust  Lopressor  12.  Hypothyroidism.  Synthroid 13.  Hyperlipidemia.  Lipitor 14. Aphasia- global vs receptive aphasia with confusion- will have pt evaluated by SLP- might benefit from Amantadine? 15. Low grade temp-resolved without recurrence 16.  Morbid obesity BMI 29.7 17. Post stroke seizure complex partial discussed with PT who witnessed RUE flexion and shaking, R LE ext and shaking, head deviation to the Right  Received Ativan , on Keppra 500mg  BID as per Neuro, neuro has signed off but they will need to see her as an outpatient,  switched to [place] via tube 11/9 18.  OSA CPAP with oxygen at home. 19. Left arm IV infiltrated--will alleviate pain with warm packs 20. Bradycardia -  on lopressor check ortho vitals and adjust dose as needed LOS: 7 days A FACE TO FACE EVALUATION WAS PERFORMED  April Le 09/24/2019, 7:55 AM

## 2019-09-24 NOTE — Progress Notes (Signed)
The patient is in Rehab and is due for her loop site check MDT ILR was implanted 09/16/2019 for cryptogenic stroke  Tegaderm/steristrips remain, though not on well.  They are removed without difficulty Skin edges are not approximate, though stable clot noted, and is clean, no bleeding or drainage.   No erythema or heat to the surrounding tissues No evidence of infection New steri strips are applied to encourage complete healing I will revisit end of week to reassess  Tommye Standard, PA-C

## 2019-09-24 NOTE — Progress Notes (Signed)
Occupational Therapy Session Note  Patient Details  Name: April Le MRN: [id number] Date of Birth: 1941/05/22  Today's Date: 09/24/2019 OT Individual Time: [id number] OT Individual Time Calculation (min): 54 min    Short Term Goals: Week 1:  OT Short Term Goal 1 (Week 1): Pt will perform mod A for toileting. OT Short Term Goal 2 (Week 1): Pt will perform Mod A for bathing tasks at sink. OT Short Term Goal 3 (Week 1): Pt will perform UB dressing with min A overall.  Skilled Therapeutic Interventions/Progress Updates:    Upon entering the room, pt seated in wheelchair with husband present. Pt with no c/o,signs, or symptoms of pain this session. Pt performed stand pivot transfer from wheelchair > drop arm commode chair with max A and second person utilized for lines/leads ( Oxygen, feeding tube, O2 monitor). Pt's pants saturated with urine when she stood up and unable to coordinate movement without increased assistance to commode chair. Pt required total A for hygiene and clothing management. Pt very distracted during session and reaching out trying to grab items on bed etc with cuing for safety. Pt having BM this session and returned to bed at end of session with pt's hand placed on grab bar in order to assist her to transition to bed. Sit >supine with mod A for B LEs into bed. RN arrived for EKG test and OT answer questions from husband regarding discharge, goals, and possible equipment needs. All needs within reach and RN present in room as OT exited.   Therapy Documentation Precautions:  Precautions Precautions: Fall Precaution Comments: cortrak Restrictions Weight Bearing Restrictions: No   Therapy/Group: Individual Therapy  Gypsy Decant 09/24/2019, 2:00 PM

## 2019-09-24 NOTE — Progress Notes (Signed)
Patient had an other bradycardic episode last night while asleep. HR 34 bpm, pt was asymptomatic. HR returned to normal limits. Will continue to monitor.

## 2019-09-24 NOTE — Progress Notes (Signed)
Occupational Therapy Session Note  Patient Details  Name: April Le MRN: [id number] Date of Birth: [birth year]  Today's Date: 09/24/2019 OT Individual Time: [id number] OT Individual Time Calculation (min): 58 min    Short Term Goals: Week 1:  OT Short Term Goal 1 (Week 1): Pt will perform mod A for toileting. OT Short Term Goal 2 (Week 1): Pt will perform Mod A for bathing tasks at sink. OT Short Term Goal 3 (Week 1): Pt will perform UB dressing with min A overall.  Skilled Therapeutic Interventions/Progress Updates:    Pt completed supine to sit with mod assist with HOB elevated.  Once sitting, she was able to maintain her balance with close supervision.  Min assist needed for dynamic sitting balance when working on selfcare tasks.  She was able to complete washing her face with min assist and initial max demonstrational cueing for initiation.  She was not able to initiate task with verbal or visual command.  Max demonstrational cueing with min assist to wash her chest as well.  She needed max hand over hand assist for washing either arm as well as her abdomen and upper legs.  She was then able to donn a pullover shirt with overall mod assist.  She oriented it correctly and donned her arms in the sleeves but needed assist pulling it over her head and down her back.  She donned the LLE in her pants leg with increased time and needed min assist to donn the right.  Max assist was needed for sit to stand and pulling them up over her hips.  Max assist was also needed for donning her gripper socks.  She was able to integrate the RUE to assist with opening deodorant with supervision.  Mod assist was needed for application of deodorant under the left arm with use of the RUE.  She also spontaneously would attempt use of the LUE with dressing but would undershoot or drop the article of clothing.  Finished session with return to supine.  Noted Oxygen sats were at 95% on 4Ls with HR in the lower 30's.  Nursing  made aware of low HR.  Pt left with bilateral mitts in place as well as soft touch call button in place.  Bed alarm turned on also.  Pt unable to state her first name when asked or place.      Therapy Documentation Precautions:  Precautions Precautions: Fall Precaution Comments: cortrak Restrictions Weight Bearing Restrictions: No  Pain: Pain Assessment Pain Scale: Faces Pain Score: 0-No pain Faces Pain Scale: No hurt ADL: See Care Tool Section for some details of ADL tasks.  Therapy/Group: Individual Therapy  Pax Reasoner OTR/L 09/24/2019, 12:23 PM

## 2019-09-24 NOTE — Progress Notes (Signed)
Speech Language Pathology Daily Session Note  Patient Details  Name: April Le MRN: [id number] Date of Birth: Sep 11, 1941  Today's Date: 09/24/2019 SLP Individual Time: [id number] SLP Individual Time Calculation (min): 29 min  Short Term Goals: Week 1: SLP Short Term Goal 1 (Week 1): Pt will consume thin trials (Ice chips) with minimal overt s/s aspiration to indicate readiness for instrumental assessment. SLP Short Term Goal 2 (Week 1): Pt will demonstrate sustained attention in 1 minute interval with mod A verbal cues for redirection. SLP Short Term Goal 3 (Week 1): Pt will express wants/needs via multimodal communciation with max A verbal/visual cues. SLP Short Term Goal 4 (Week 1): Pt will follow 1 step commands with max A verbal/visual cues in 3 out 5 opportunties. SLP Short Term Goal 5 (Week 1): Pt will produce automatic language sequences with 30% accuracy.  Skilled Therapeutic Interventions: Pt was seen for skilled ST targeting dysphagia goals. SLP doffed pt's left mitten to allow opportunity for pt to participate in oral care. She was able to hold toothbrush attached to suction, however unable to execute purposeful movements needed to complete thorough oral care. Thus, SLP provided Max increasing to Total A for thorough oral care. Pt still with dried secretions coating tongue, however with added moisture SLP able to remove ~25% debris today. Continue with diligent QID oral care with suction. SLP also provided trials of ice chips and thin liquids. Immediate throat clear noted in 1 out of 10 ice chips. Immediate throat clear X2 and immediate cough X2 noted out of 10 thin H2O trials (via teaspoon). Recommend continue NPO for now and SLP will continue to provide PO trials to determine readiness for instrumental MBSS. Of note, pt's pulse dropped to 36-37 and remained at that rate for ~1-2 minutes. NT and RN made aware. Pt left laying in bed with alarm set, needs within reach, and NT present.  Continue per current plan of care.       Pain Pain Assessment Pain Scale: Faces Faces Pain Scale: No hurt  Therapy/Group: Individual Therapy  [name] 09/24/2019, 8:46 AM

## 2019-09-24 NOTE — Progress Notes (Signed)
Physical Therapy Session Note  Patient Details  Name: April Le MRN: [id number] Date of Birth: 09-25-1941  Today's Date: 09/24/2019 PT Individual Time: 1125-1208 PT Individual Time Calculation (min): 43 min   Short Term Goals: Week 1:  PT Short Term Goal 1 (Week 1): Pt will perform bed mobility with mod assist PT Short Term Goal 2 (Week 1): Pt will transfer to [facility] with mod assist PT Short Term Goal 3 (Week 1): Pt will remain OOB up to 2 hours  Skilled Therapeutic Interventions/Progress Updates: Pt presented in bed agreeable to therapy. Per nsg pt able to participate in therapy if pt becomes bradycardic. Pt HR in 70s at start of session. Pt nodding head in agreement for EOB activities. Performed supine to sit at EOB with modA. Required mod multimodal cues for sequencing and use of bed rail, pt was able to scoot to EOB with minA. Once at EOB nsg assisted with disconnecting NG tube. PTA taped tubing to pt's sweater and nsg left to obtain safety pin to fasten tube to sweater to decrease distraction of pt pulling. While at EOB pt impulsively stands with CGA. Once in standing PTA providing minA for standing balance without AD. PTA then providing HHA and encouraging pt to participate in wt shifting activity. Nsg arrived and provided RW for standing balance. Pt then ambulated approx 8 ft with RW and modA with +2 for safety as nsg exchanged O2 line to portable tank. Pt was then able to sit in standard chair with minA for controlled descent. PTA obtained w/c and pt performed ambulatory transfer to w/c with modA and max cues for RLE management. Pt transported to rehab gym and PTA exchanged w/c to hemi height chair and added cushion. Pt initial instruction pt was able to propel w/c approx 66ft with minA and use of BUE. Pt transported back to room at end of session and remained in w/c with belt alarm on and husband present. PTA answered current questions husband had regarding pt's current status, weekly  conference note, and anticipation of pt's current progression of therapies.       Therapy Documentation Precautions:  Precautions Precautions: Fall Precaution Comments: cortrak Restrictions Weight Bearing Restrictions: No General:   Vital Signs:  Pain: Pain Assessment Pain Scale: Faces Pain Score: 0-No pain Faces Pain Scale: No hurt   Therapy/Group: Individual Therapy  April Le  Warnell Rasnic, PTA  09/24/2019, 12:27 PM

## 2019-09-25 ENCOUNTER — Inpatient Hospital Stay (HOSPITAL_COMMUNITY): Payer: Medicare Other | Admitting: Occupational Therapy

## 2019-09-25 ENCOUNTER — Inpatient Hospital Stay (HOSPITAL_COMMUNITY): Payer: Medicare Other | Admitting: Speech Pathology

## 2019-09-25 ENCOUNTER — Inpatient Hospital Stay (HOSPITAL_COMMUNITY): Payer: Medicare Other | Admitting: Physical Therapy

## 2019-09-25 ENCOUNTER — Inpatient Hospital Stay: Payer: Self-pay

## 2019-09-25 MED ORDER — LEVOTHYROXINE SODIUM 100 MCG/5ML IV SOLN
25.0000 ug | Freq: Every day | INTRAVENOUS | Status: DC
  Administered 2019-09-25 – 2019-09-30 (×6): 25 ug via INTRAVENOUS
  Filled 2019-09-25 (×6): qty 5

## 2019-09-25 MED ORDER — LISINOPRIL 2.5 MG PO TABS
2.5000 mg | ORAL_TABLET | Freq: Every day | ORAL | Status: DC
  Administered 2019-09-27 – 2019-10-09 (×13): 2.5 mg via ORAL
  Filled 2019-09-25 (×13): qty 1

## 2019-09-25 MED ORDER — SODIUM CHLORIDE 0.9% FLUSH: 10.0000 mL | INTRAVENOUS | Status: DC | PRN

## 2019-09-25 MED ORDER — SODIUM CHLORIDE 0.9% FLUSH
10.0000 mL | Freq: Two times a day (BID) | INTRAVENOUS | Status: DC
  Administered 2019-09-25: 10 mL
  Administered 2019-09-26: 15 mL
  Administered 2019-09-27 – 2019-10-01 (×3): 10 mL

## 2019-09-25 MED ORDER — KCL IN DEXTROSE-NACL 10-5-0.45 MEQ/L-%-% IV SOLN
INTRAVENOUS | Status: DC
  Administered 2019-09-25 – 2019-09-30 (×7): via INTRAVENOUS
  Filled 2019-09-25 (×7): qty 1000

## 2019-09-25 MED ORDER — LEVETIRACETAM IN NACL 500 MG/100ML IV SOLN
500.0000 mg | Freq: Two times a day (BID) | INTRAVENOUS | Status: DC
  Administered 2019-09-25 – 2019-09-30 (×9): 500 mg via INTRAVENOUS
  Filled 2019-09-25 (×11): qty 100

## 2019-09-25 MED ORDER — FUROSEMIDE 10 MG/ML IJ SOLN
40.0000 mg | Freq: Two times a day (BID) | INTRAMUSCULAR | Status: DC
  Administered 2019-09-25 – 2019-10-01 (×13): 40 mg via INTRAVENOUS
  Filled 2019-09-25 (×14): qty 4

## 2019-09-25 MED ORDER — JEVITY 1.2 CAL PO LIQD
1000.0000 mL | ORAL | Status: DC
  Filled 2019-09-25: qty 1000

## 2019-09-25 MED ORDER — CHLORHEXIDINE GLUCONATE CLOTH 2 % EX PADS
6.0000 | MEDICATED_PAD | Freq: Every day | CUTANEOUS | Status: DC
  Administered 2019-09-25 – 2019-10-08 (×9): 6 via TOPICAL

## 2019-09-25 NOTE — Progress Notes (Signed)
Speech Language Pathology Daily Session Note  Patient Details  Name: Georgean Foltyn MRN: [id number] Date of Birth: March 12, 1941  Today's Date: 09/25/2019 SLP Individual Time: 0821-0900; [id number] SLP Individual Time Calculation (min): 39 min; 25 min  Short Term Goals: Week 1: SLP Short Term Goal 1 (Week 1): Pt will consume thin trials (Ice chips) with minimal overt s/s aspiration to indicate readiness for instrumental assessment. SLP Short Term Goal 2 (Week 1): Pt will demonstrate sustained attention in 1 minute interval with mod A verbal cues for redirection. SLP Short Term Goal 3 (Week 1): Pt will express wants/needs via multimodal communciation with max A verbal/visual cues. SLP Short Term Goal 4 (Week 1): Pt will follow 1 step commands with max A verbal/visual cues in 3 out 5 opportunties. SLP Short Term Goal 5 (Week 1): Pt will produce automatic language sequences with 30% accuracy.  Skilled Therapeutic Interventions:  Skilled treatment session focused on dysphagia, cognitive and speech communication goals. SLP recieved pt upright in bed with bilateral mittens in place. Pt smiling as SLP removed mittens to allow pt fuller participation in tasks. SLP presented pt with wet washcloth and provided Total A hand over hand as well as visual model for wiping her face. 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 Pt opened mouth for oral care with oral mucsa much improved and nursing targeting frequently. SLP provided skilled obervation of pt consuming ice chips. Pt with variable s/s of aspiration including intermittent multiple swallows and intermittent throat clears. Pt freuently attempted to talk while consuming which impeded safe swallow. Pt is appropriate for instrumental swallow study on 09/26/19. Total A given for self-feeding of ice chips. Pt progressed to SLP loading spoon and pt was then able to bring to her mouth x 10 attempts. Additionally, pt  engaged in [place] with use of song "Amazing Shirlee [place]" with good intonation and ~25% recognizable sounds. Pt able to carryover melody to targeted phrases that included "How are you?" and "good morning" but despite ability to produce melody, pt's speech was unintelligible d/t paraphasias.   Skilled treatment session focused on providing education to pt's husband. SLP provided education on plan to perform MBS on 11/12. MBS procedure explained and all questions answered to husband's satisfaction. He had multiple questions regarding potential to return to PO intake and POC. Basic questions answered with more information after MBS to be provided to pt's husband. He also had multiple questions about his son accompanying pt to MBS. All questions answered about his son and nursing aware of time for MBS.     Pain    Therapy/Group: Individual Therapy  Everrett Lacasse 09/25/2019, 1:43 PM

## 2019-09-25 NOTE — Progress Notes (Signed)
Social Work Patient ID: April Le, female   DOB: Mar 12, 1941, 78 y.o.   MRN: [id number]  Met with pt and husband and also spoke with their daughter-Stephanie-via telephone to discuss team conference goals supervision-min assist level and target discharge 11/25. Son is coming in from [place] today and will discuss with sister and dad best plan for pt. They are hopeful she will pass her MBS tomorrow and not need the tube back in or need a PEG tube. Pt tries to communicate but is garbled and difficult to determine what she is saying due to her aphasia. Will sit down with daughter and son tomorrow and work on best plan for pt. Husband has some health issues of his own,so will need to determine what level of care they can provide to pt at [place].

## 2019-09-25 NOTE — Progress Notes (Signed)
Occupational Therapy Weekly Progress Note  Patient Details  Name: Darline Faith MRN: [id number] Date of Birth: 1941/03/09  Beginning of progress report period: September 18, 2019 End of progress report period: September 25, 2019  Today's Date: 09/25/2019 OT Individual Time: 1130-1200 and 1515-1600 OT Individual Time Calculation (min): 30 min and 45 mins   Patient has met 1 of 3 short term goals. After initial evaluation pt having significant medical event now reported as seizure which delayed some progress this week. Pt is participating in each session but has significant deficits in communication, cognition, safety awareness, and motor planning. Pt's husband has been present during  sessions as well to increase pt's participation but she appears very restless and anxious once he leaves. Pt's level of assist fluctuates with functional transfer from min - max A. Pt with increased difficulty with bathing tasks and needing occasional hand over hand assistance for motor planning. UB self care requires mod A and LB self care max A overall. Max multimodal cuing needed for all functional tasks.   Patient continues to demonstrate the following deficits: muscle weakness, decreased cardiorespiratoy endurance and decreased oxygen support, motor apraxia, ataxia and decreased coordination, visual inattention, decreased initiation, decreased attention, decreased awareness, decreased problem solving, decreased safety awareness and delayed processing and decreased sitting balance, decreased standing balance, hemiplegia and decreased balance strategies and therefore will continue to benefit from skilled OT intervention to enhance overall performance with BADL and Reduce care partner burden.  Patient progressing toward long term goals..  Continue plan of care.  OT Short Term Goals Week 2:  OT Short Term Goal 1 (Week 2): Pt will perform bathing tasks with mod A overall. OT Short Term Goal 2 (Week 2): Pt will perform  LB dressing with mod A overall. OT Short Term Goal 3 (Week 2): Pt will perform grooming tasks at sink with min A overall. OT Short Term Goal 4 (Week 2): Pt will perform toileting with min A overall.  Skilled Therapeutic Interventions/Progress Updates:    Session 1: Upon entering the room, pt supine in bed with no signs or symptoms of pain. Pt is agreeable to OT intervention and agreeable to toileting this session. Supine >sit with min A to EOB. Mod A stand pivot transfer from bed >wheelchair. OT assisted pt into bathroom via wheelchair and pt following demonstrational cuing for hand placement with transfer requiring min A. Pt needing assistance with clothing management but able to do hygiene while seated with minimal cuing needed. Pt's brief was wet and she was unable to void further after sitting several minutes on commode. Pt returning to wheelchair and leaving forward to bed from wheelchair in an attempt to make known she would like to return to bed. Min A stand pivot transfer with use of bed rail. Sit >supine with min guard and bed alarm activated with soft call bell within reach.   Session 2: Upon entering the room, Pt supine in bed with RN present in room and starting IV. Pt with increased frustration as she points around room and communicates with garbled speech. OT attempting to assist pt with oral hygiene but pt pushes therapist hands away. Pt very impulsive and attempting to get out of bed and standing with min A. OT quickly sitting pt into wheelchair and pt propelling wheelchair with B LEs into hallway and unable to be redirected. OT attempting to assist pt back into room but she plants feet on floor. OT assisted pt to RN station with chair alarm belt donned  and activated and B hand mitts donned again secondary to pt attempting to pull out IV.   Therapy Documentation Precautions:  Precautions Precautions: Fall Precaution Comments: cortrak Restrictions Weight Bearing Restrictions:  No   Therapy/Group: Individual Therapy  Gypsy Decant 09/25/2019, 12:49 PM

## 2019-09-25 NOTE — Progress Notes (Signed)
[place] PHYSICAL MEDICINE & REHABILITATION PROGRESS NOTE   Subjective/Complaints:  No issues overnite , discussed swallow with SLP   ROS- limited by cognition   Objective:   No results found. No results for input(s): WBC, HGB, HCT, PLT in the last 72 hours. Recent Labs    09/24/19 0537  CREATININE 0.96    Intake/Output Summary (Last 24 hours) at 09/25/2019 0806 Last data filed at 09/24/2019 1820 Gross per 24 hour  Intake 0 ml  Output -  Net 0 ml     Physical Exam: Vital Signs Blood pressure (!) 153/67, pulse 66, temperature 98 F (36.7 C), temperature source Oral, resp. rate 15, height 5' (1.524 m), weight 70 kg, SpO2 99 %.   General: No acute distress Mood and affect are appropriate Heart: Regular rate and rhythm no rubs murmurs or extra sounds Lungs: Clear to auscultation, breathing unlabored, no rales or wheezes Abdomen: Positive bowel sounds, soft nontender to palpation, nondistended Extremities: No clubbing, cyanosis. Left arm with mild swelling and redness, with warmth and tenderness compared to right arm.   Skin: No evidence of breakdown, no evidence of rash Neurologic: cannot cooperate with exam, withdraws to pinch in all 4 limbs 4/5 in RUE and RLE (formal testing difficult due to problems following commands)  Unable to open mouth or move limbs except wit hand over hand cues  Musculoskeletal: Full range of motion in all 4 extremities. No joint swelling   Assessment/Plan: 1. 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  Physiatrist is providing close team supervision and 24 hour management of active medical problems listed below.  Physiatrist and rehab team continue to assess barriers to discharge/monitor patient progress toward functional and medical goals  Care Tool:  Bathing    Body parts bathed by patient: Face, Chest   Body parts  bathed by helper: Right arm, Left arm, Chest, Right lower leg, Left lower leg, Right upper leg, Left upper leg, Abdomen     Bathing assist Assist Level: Maximal Assistance - Patient 24 - 49%     Upper Body Dressing/Undressing Upper body dressing Upper body dressing/undressing activity did not occur (including orthotics): Refused What is the patient wearing?: Pull over shirt    Upper body assist Assist Level: Moderate Assistance - Patient 50 - 74%    Lower Body Dressing/Undressing Lower body dressing    Lower body dressing activity did not occur: Refused What is the patient wearing?: Pants     Lower body assist Assist for lower body dressing: Maximal Assistance - Patient 25 - 49%     Toileting Toileting    Toileting assist Assist for toileting: 2 Helpers     Transfers Chair/bed transfer  Transfers assist  Chair/bed transfer activity did not occur: Safety/medical concerns  Chair/bed transfer assist level: Moderate Assistance - Patient 50 - 74%     Locomotion Ambulation   Ambulation assist   Ambulation activity did not occur: Safety/medical concerns  Assist level: Moderate Assistance - Patient 50 - 74% Assistive device: Walker-rolling Max distance: 22f   Walk 10 feet activity   Assist  Walk 10 feet activity did not occur: Safety/medical concerns  Assist level: Moderate Assistance - Patient - 50 - 74% Assistive device: Walker-rolling   Walk 50 feet activity   Assist Walk 50 feet with 2 turns activity did not occur: Safety/medical concerns         Walk 150  feet activity   Assist Walk 150 feet activity did not occur: Safety/medical concerns         Walk 10 feet on uneven surface  activity   Assist Walk 10 feet on uneven surfaces activity did not occur: Safety/medical concerns         Wheelchair     Assist Will patient use wheelchair at discharge?: Yes   Wheelchair activity did not occur: Safety/medical concerns          Wheelchair 50 feet with 2 turns activity    Assist    Wheelchair 50 feet with 2 turns activity did not occur: Safety/medical concerns       Wheelchair 150 feet activity     Assist  Wheelchair 150 feet activity did not occur: Safety/medical concerns       Blood pressure (!) 153/67, pulse 66, temperature 98 F (36.7 C), temperature source Oral, resp. rate 15, height 5' (1.524 m), weight 70 kg, SpO2 99 %.  Medical Problem List and Plan: 1.  Right side weakness with aphasia secondary to acute      cortical infarct involving majority of the left MCA distribution/occluded left middle cerebral artery M1 segment status post revascularization as well as history of CVA with mild left-sided weakness and aphasia.  Status post loop recorder placement CIR PT OT speech- Team conference today please see physician documentation under team conference tab, met with team face-to-face to discuss problems,progress, and goals. Formulized individual treatment plan based on medical history, underlying problem and comorbidities. Discussed swallowing status with patient, husband and daughter.  The patient will need another swallowing test and if she makes some improvements may start p.o. and avoid PEG tube.  If there is no improvement may need to pursue PEG Appreciate SLP note, cough with ice chips yesterday  2.  Antithrombotics:  -DVT/anticoagulation: Lovenox.             -antiplatelet therapy: Aspirin 325 mg daily, Plavix 75 mg daily- 3. Pain Management: Tylenol as needed 4. Mood: Aricept 5 mg daily, Seroquel 25 mg nightly             -antipsychotic agents: seroquel QHS for sundowning 5. Neuropsych: This patient is not capable of making decisions on her own behalf. 6. Skin/Wound Care: Routine skin checks 7. Fluids/Electrolytes/Nutrition: Routine in and outs with follow-up chemistries 8.  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  Monitor for any signs of fluid overload.  Lasix 40  mg twice daily Monitor daily weights 10.  Dysphagia.  Nasogastric tube feeds; is NPO- if Coretrak needs to come out due to d/c, will need to place PEG.  Follow-up speech therapy 10.  CKD stage III.  Follow-up chemistries 11.  Hypertension.  Lopressor 50 mg twice daily.  Monitor with increased mobility Vitals:   09/24/19 1943 09/25/19 0604  BP: (!) 164/79 (!) 153/67  Pulse: 75 66  Resp: 18 15  Temp: 98.6 F (37 C) 98 F (36.7 C)  SpO2: 78% 24%  systolic hypertension add low dose ACE   Pulse is OK 12.  Hypothyroidism.  Synthroid 13.  Hyperlipidemia.  Lipitor 14. Aphasia- global vs receptive aphasia with confusion- will have pt evaluated by SLP- might benefit from Amantadine? 15. Low grade temp-resolved without recurrence 16.  Morbid obesity BMI 29.7 17. Post stroke seizure complex partial discussed with PT who witnessed RUE flexion and shaking, R LE ext and shaking, head deviation to the Right  Received Ativan , on Keppra 523m BID as per Neuro,  neuro has signed off but they will need to see her as an outpatient,  switched to [place] via tube 11/9 18.  OSA CPAP with oxygen at home. 19. Left arm IV infiltrated--will alleviate pain with warm packs 20. Bradycardia - on lopressor check ortho vitals and adjust dose as needed LOS: 8 days A FACE TO FACE EVALUATION WAS PERFORMED  Charlett Blake 09/25/2019, 8:06 AM

## 2019-09-25 NOTE — Progress Notes (Signed)
Physical Therapy Session Note  Patient Details  Name: April Le MRN: [id number] Date of Birth: 1941/02/10  Today's Date: 09/25/2019 PT Individual Time: 0802-2336 PT Individual Time Calculation (min): 53 min   Short Term Goals: Week 1:  PT Short Term Goal 1 (Week 1): Pt will perform bed mobility with mod assist PT Short Term Goal 2 (Week 1): Pt will transfer to [facility] with mod assist PT Short Term Goal 3 (Week 1): Pt will remain OOB up to 2 hours  Skilled Therapeutic Interventions/Progress Updates:   Pt received supine in bed and agreeable to PT. Supine>sit transfer with min assist and multimodal cues for safety and improved awareness of task goal.   Stand pivot transfer to [facility] with HHA and min assist for safety. Mild difficulty sifting weight over the RLE. Pt transported to rehab gym in [facility].  Gait training with RW x 42f with min assist + 2 for O2 management and safety in turns. Pt noted to have decreased weight shift and step length with posterior stepping to turn to sit in WC.   [facility] Stand pivot transfer to nustep with mod assist and HHA on the R. Nustep reciprocal movement and endurance training x 5 min with continuous pulse ox monitoring: >95% on 2L/min throughout. Min cues for use of BLE and BUE throughout entire time.   Pt returned to room and performed ambulatory transfer to bed with HHA on the L and min assist. Sit>supine completed with CGA for safety and RLE management. Pt left supine in bed with call bell in reach and all needs met.        Therapy Documentation Precautions:  Precautions Precautions: Fall Precaution Comments: cortrak Restrictions Weight Bearing Restrictions: No Vital Signs: Therapy Vitals Temp: 97.8 F (36.6 C) Temp Source: Oral Pulse Rate: 62 Resp: 18 BP: 134/66 Patient Position (if appropriate): Lying Oxygen Therapy SpO2: 98 % O2 Device: Room Air Pain: Denies   Therapy/Group: Individual Therapy  ALorie Phenix11/09/2019, 2:05 PM

## 2019-09-25 NOTE — Patient Care Conference (Signed)
Inpatient RehabilitationTeam Conference and Plan of Care Update Date: 09/25/2019   Time: 10:05 AM   Patient Name: April Le      Medical Record Number: [id number]  Date of Birth: 12/20/1940 Sex: Female         Room/Bed: 4W06C/4W06C-01 Payor Info: Payor: [employer] MEDICARE / Plan: [facility] MEDICARE / Product Type: *No Product type* /    Admit Date/Time:  09/17/2019  6:04 PM  Primary Diagnosis:  Left middle cerebral artery stroke [facility])  Patient Active Problem List   Diagnosis Date Noted  . Left middle cerebral artery stroke ([place]) 09/17/2019  . Hx of completed stroke 09/16/2019  . Dysphagia following cerebrovascular accident (CVA) 09/16/2019  . Malnutrition ([place]) 09/16/2019  . Acute blood loss anemia 09/16/2019  . Thrombocytopenia ([place]) 09/16/2019  . Acute ischemic left MCA stroke ([facility]) s/p IR, embolic source unknown [id number]  . 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 Bilateral lower extremity edema 07/18/2019  . Weakness of left leg   . Urinary incontinence   . Tumor of ovary   . Stroke ([place])   . Sinus drainage   . Shortness of breath   . Pneumonia   . Hypothyroidism   . Hypertension   . Hypercholesteremia   . Headache   . GERD (gastroesophageal reflux disease)   . Complication of anesthesia   . CKD (chronic kidney disease), stage III   . Arthritis   . Aphasia due to acute stroke ([place])   . Abdominal distention   . Coronary artery calcification seen on CT scan 01/10/2017  . Hypertensive heart disease with heart failure ([place]) 01/10/2017  . Abnormality of gait 10/28/2015  . 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 Chronic diastolic heart failure ([place]) 11/19/2013  . Benign essential HTN 11/19/2013  . OSA (obstructive sleep apnea) 11/19/2013  . CAP (community acquired pneumonia) 09/07/2013  . Leukocytosis 09/04/2013  . Fever of unknown origin 09/04/2013  . Hypoxia 09/04/2013  . SOB (shortness of breath)  09/04/2013  . SIRS (systemic inflammatory response syndrome) ([place]) 09/04/2013  . Mass of ovary 10/26/2011    Expected Discharge Date: Expected Discharge Date: 10/09/19  Team Members Present: Physician leading conference: Dr. Alysia Penna Social Worker Present: Ovidio Kin, LCSW Nurse Present: Dorien Chihuahua, RN Case Manager: Karene Fry, RN PT Present: Barrie Folk, PT;Rosita Dechalus, PTA OT Present: Darleen Crocker, OT SLP Present: Stormy Fabian, SLP PPS Coordinator present : Gunnar Fusi, SLP     Current Status/Progress Goal Weekly Team Focus  Bowel/Bladder   incontinent of b/b; LBM: 11/11  continue time tolieting while awake  assist with tolieting needs prn   Swallow/Nutrition/ Hydration   NPO, ice chip/teaspoon thin liquid trials, will schedule MBS as soon as medically stable/endurance improves slightly  Supervision  PO readiness, liquid and puree trials, MBSS?   ADL's   +2 assist for self care tasks and toileting sit<stand using bari Stedy. 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     Barriers to  Discharge  Current Status/Progress Possible Resolutions Date Resolved   Nursing                  PT                    OT                  SLP                SW                Discharge Planning/Teaching Needs:  Husband wants to take home and daughter to assist, but will depend upon how much care pt will require if this is realistic. Will discuss further with family to confirm a  plan      Team Discussion: Onset seizures last week, started keppra, somnolent a couple days, NG tube, plan to repeat swallow.  RN - inc B/B, restless at times, NG tube clogged, O2 sats 90-92% RA.  OT mod to total self care tasks, dressing a little better, distracted by environment, transfers not great, +2 for toileting, S goals overall.  PT fluctuates, Monday lethargic mod/max A, yesterday more awake, impulsive, CGA bed, transfers mod A RW, followed commands, amb 8' mod A, min A goals.  SLP needs timed toileting, takes long time, can check MBS tomorrow, exp aphasia worse than reciptive, ?may need PEG.   Revisions to Treatment Plan: N/A     Medical Summary Current Status: incont Bowel adn bladder, had post stroke seizure d/o now on Keppra VT, Severe aphasia and apraxia Weekly Focus/Goal: swallow  eval this week, may need PEG  Barriers to Discharge: Medical stability;Nutrition means   Possible Resolutions to Barriers: see above   Continued Need for Acute Rehabilitation Level of Care: The patient requires daily medical management by a physician with specialized training in physical medicine and rehabilitation for the following reasons: Direction of a multidisciplinary physical rehabilitation program to maximize functional independence : Yes Medical management of patient stability for increased activity during participation in an intensive rehabilitation regime.: Yes Analysis of laboratory values and/or radiology reports with any subsequent need for medication adjustment and/or medical intervention. :  Yes   I attest that I was present, lead the team conference, and concur with the assessment and plan of the team.   Retta Diones 09/25/2019, 3:26 PM  Team conference was held via web/ teleconference due to [place] - 19

## 2019-09-25 NOTE — Progress Notes (Signed)
Nutrition Follow-up  DOCUMENTATION CODES:   Not applicable  INTERVENTION:  Decrease tube feeding using Jevity 1.2 formula via post pyloric NGT to new goal rate of 65 ml/hr x 20 hours (may hold TF for up to 4 hours for therapy).  Free water flushes of 200 ml q 6 hours. (MD to adjust as appropriate)  Tube feeding to provide 1560 kcal, 72 grams of protein, and 1788 ml water.   NUTRITION DIAGNOSIS:   Inadequate oral intake related to inability to eat as evidenced by NPO status; ongoing  GOAL:   Patient will meet greater than or equal to 90% of their needs; met with TF  MONITOR:   Diet advancement, TF tolerance, Skin, Weight trends, Labs, I & O's  REASON FOR ASSESSMENT:   Consult Enteral/tube feeding initiation and management  ASSESSMENT:   78 year old right-handed female with history of 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 Presented 09/11/2019 nonverbal with right side weakness. CT angiogram of head and neck showed left distal M1 embolism extending into proximal M2 branches with poorly enhancing downstream vessels. Remote moderate left lateral frontal and upper insular infarction. CT perfusion showed a 38 cc acute infarct and 56 cc of superimposed penumbra. Underwent cerebral angiogram with complete revascularization of occluded Lt LMCA M1 segment. MRI follow-up showed acute cortical infarct involving majority of left MCA distribution. Patient is currently n.p.o. with nasogastric tube feeds.  Pt has been tolerating her tube feeds well with no difficulties. Plans for swallow evaluation tomorrow. Per Md, if unable to advance diet, may need consideration of PEG placement. Noted pt with gradual weight increase. RD to modify tube feeding regimen to maintain stable weight. RD to continue to monitor for tolerance.   Labs  and medications reviewed.   Diet Order:   Diet Order            Diet NPO time specified  Diet effective now              EDUCATION NEEDS:   Not appropriate for education at this time  Skin:  Skin Assessment: Reviewed RN Assessment  Last BM:  11/9  Height:   Ht Readings from Last 1 Encounters:  09/17/19 5' (1.524 m)    Weight:   Wt Readings from Last 1 Encounters:  09/25/19 70 kg    Ideal Body Weight:  45.45 kg  BMI:  Body mass index is 30.14 kg/m.  Estimated Nutritional Needs:   Kcal:  1500-1800  Protein:  70-90 grams  Fluid:  >/= 1.5 L/day    Corrin Parker, MS, RD, LDN Pager # [phone] After hours/ weekend pager # [phone]

## 2019-09-25 NOTE — Progress Notes (Signed)
Physical Therapy Session Note  Patient Details  Name: April Le MRN: [id number] Date of Birth: 07/04/1941  Today's Date: 09/25/2019 PT Individual Time: 1035-1107 PT Individual Time Calculation (min): 32 min   Short Term Goals: Week 1:  PT Short Term Goal 1 (Week 1): Pt will perform bed mobility with mod assist PT Short Term Goal 2 (Week 1): Pt will transfer to [facility] with mod assist PT Short Term Goal 3 (Week 1): Pt will remain OOB up to 2 hours  Skilled Therapeutic Interventions/Progress Updates: Pt presented in bed agreeable to therapy. Pt continues to speak with highly garbled speech but was able to follow single step commands approx 75% of time throughout session. Pt performed supine to sit with minA via log roll technique with minA and mod multimodal cues for sequencing. Performed stand pivot transfer with RW to w/c with minA for STS and modA for transfer. Pt transported to day room and participated in Cybed Kinetron 80cm/sec 2 bouts of 10 cycles for reciprocal activity and sustained task. Pt did required increased time to initiate activity however was able to start second bout automatically. Pt returned to room and ambulated from door to bed with modA and RW with cues to increase R step length which pt was able to follow. Pt returned to bed at end of session and performed sit to supine with CGA, increased time and use of bed features. Pt repositioned to comfort and left with call bell within reach and bed alarm on.      Therapy Documentation Precautions:  Precautions Precautions: Fall Precaution Comments: cortrak Restrictions Weight Bearing Restrictions: No General:   Vital Signs:  Pain:   Mobility:   Locomotion :    Trunk/Postural Assessment :    Balance:   Exercises:   Other Treatments:      Therapy/Group: Individual Therapy  Moksh Loomer 09/25/2019, 12:18 PM

## 2019-09-26 ENCOUNTER — Inpatient Hospital Stay (HOSPITAL_COMMUNITY): Payer: Medicare Other | Admitting: Physical Therapy

## 2019-09-26 ENCOUNTER — Encounter (HOSPITAL_COMMUNITY): Payer: Medicare Other | Admitting: Speech Pathology

## 2019-09-26 ENCOUNTER — Ambulatory Visit: Payer: Medicare Other

## 2019-09-26 ENCOUNTER — Inpatient Hospital Stay (HOSPITAL_COMMUNITY): Payer: Medicare Other

## 2019-09-26 ENCOUNTER — Inpatient Hospital Stay (HOSPITAL_COMMUNITY): Payer: Medicare Other | Admitting: Occupational Therapy

## 2019-09-26 NOTE — Plan of Care (Signed)
  Problem: Consults Goal: RH STROKE PATIENT EDUCATION Description: See Patient Education module for education specifics  Outcome: Progressing   Problem: RH BOWEL ELIMINATION Goal: RH STG MANAGE BOWEL WITH ASSISTANCE Description: STG Manage Bowel with [place]. Outcome: Progressing Goal: RH STG MANAGE BOWEL W/MEDICATION W/ASSISTANCE Description: STG Manage Bowel with Medication with min Assistance. Outcome: Progressing   Problem: RH BLADDER ELIMINATION Goal: RH STG MANAGE BLADDER WITH ASSISTANCE Description: STG Manage Bladder With min Assistance Outcome: Progressing   Problem: RH SAFETY Goal: RH STG ADHERE TO SAFETY PRECAUTIONS W/ASSISTANCE/DEVICE Description: STG Adhere to Safety Precautions With min Assistance/Device. Outcome: Progressing   Problem: RH COGNITION-NURSING Goal: RH STG ANTICIPATES NEEDS/CALLS FOR ASSIST W/ASSIST/CUES Description: STG Anticipates Needs/Calls for Assist With min Assistance/Cues. Outcome: Progressing   Problem: RH PAIN MANAGEMENT Goal: RH STG PAIN MANAGED AT OR BELOW PT'S PAIN GOAL Description: < 3 out of 10.  Outcome: Progressing

## 2019-09-26 NOTE — Progress Notes (Signed)
Social Work Patient ID: April Le, female   DOB: Oct 25, 1941, 78 y.o.   MRN: [id number]  Met with son and daughter to discuss team conference goals min assist level and target discharge 11/25. Son observed Mom in therapies today and saw the MBS. Both are planning on getting Mom home at discharge and helping Dad with her care or assisting with hiring assist to help Dad-pt's husband with her care. Will continue to work on discharge needs.

## 2019-09-26 NOTE — Progress Notes (Addendum)
Physical Therapy Session Note  Patient Details  Name: April Le MRN: [id number] Date of Birth: [birth year]  Today's Date: 09/26/2019 PT Individual Time: 1054-1202 PT Individual Time Calculation (min): 68 min   Short Term Goals: Week 1:  PT Short Term Goal 1 (Week 1): Pt will perform bed mobility with mod assist PT Short Term Goal 2 (Week 1): Pt will transfer to [facility] with mod assist PT Short Term Goal 3 (Week 1): Pt will remain OOB up to 2 hours  Skilled Therapeutic Interventions/Progress Updates:    Pt received supine in bed with her son present - pt continues to be limited in verbal response to therapist's questions often stating "yes" or "no" - pt agreeable to therapy session. Per RN, pt no longer requiring O2 during the day. Pt able to follow 1step commands with increased verbal and visual cuing. Supine>sitting EOB, HOB partially elevated and using bedrails, mod assist and max multimodal cuing for logroll technique to increase pt participation and independent - required manual facilitation to initiate the task. Sitting EOB with close supervision and intermittent min assist for trunk control donned pants with mod assist - pt able to initiate this task more easily. Stand pivot EOB>w/c, no AD, with mod assist for lifting/balance/pivoting hips. Assessed SpO2 and HR: 94% and 73bpm. Transported to/from gym in w/c. Ambulated 69ft, 17ft (seated break between each) using RW with min progressed to mod assist for balance and R LE management; +2 for w/c follow and IV line management - pt demonstrates progressively shorter step lengths with progressively decreased B LE foot clearance (RLE more impaired than L LE) - cuing and manual facilitation for increased step length. After 2nd bout of ambulation HR noted to be 42bpm increasing to 83-85bpm within 30 seconds. Stand pivot w/c>EOM using RW with mod assist for balance and AD management - visual cuing for sequencing. Sit<>stand EOM<>RW with min/mod assist for  lifting/lowering and pt demonstrating increased motor planning impairments. Pt suddenly states "I peed" L sand pivot EOM>w/c using RW with mod assist for pivoting hips fully. Transported back to room in w/c. Sit<>stand with B UE support on sink with min assist and standing with CGA while therapist performed total assist LB clothing management and peri-care - no obvious urine in brief. Sit>stand w/c>RW with mod assist for lifting and while standing performed R attention and dynamic standing balance task to visually scan and grasp specified object from R visual field. Therapist educated pt's son and daughter about pt's R inattention and to engage her in conversations on that side. Pt left seated in w/c with needs in reach, seat belt alarm on, her son and daughter, and SW Jacqlyn Larsen present.   Addendum: RN notified of decreased HR after ambulation.  Therapy Documentation Precautions:  Precautions Precautions: Fall Precaution Comments: cortrak Restrictions Weight Bearing Restrictions: No  Pain:   Reports tenderness on sore of L forearm but otherwise denies pain. RN notified of sore spot.    Therapy/Group: Individual Therapy  Tawana Scale, PT, DPT 09/26/2019, 7:58 AM

## 2019-09-26 NOTE — Progress Notes (Signed)
Modified Barium Swallow Progress Note  Patient Details  Name: April Le MRN: [id number] Date of Birth: May 12, 1941  Today's Date: 09/26/2019  Modified Barium Swallow completed.  Full report located under Chart Review in the Imaging Section.  Brief recommendations include the following:  Clinical Impression  Pt presents with siginficant oral phase dysphagia that results in decreased containment of nectar thick liquids and thin liquids. As a result, pt has premature spillage of both nectar and thin liquids to pyriform sinuses before swallow initiation occurs. During the swallow both consistencies are penetrated, however nectar thick liquids are not penetrated as often and are not penetrated as deeply as thin liquids. On several occasions, thin liquids were penetrated to the level of the cords with some accumulation of the residuals noted. Poor oral control also results in more oral residue with nectar thick and thin liquids that contributes to pharyngeal residue post swallow in the vallucula and pyriform sinuses. Even with time lapse and pt talking, pharyngeal residuals were not aspirated.  Pt also observed consuming 5 cc of thin liquids and 10 cc of thin liquids for potential use of Provale cup. Pt would be appropriate for water protocol via spoon but not 10 cc amounts. Pt actually demonstrated better bolus cohesion, AP transit and more timely swallow initiation with puree and dysphagia 2 with much less oropharyngeal residue. At this time, recommend initiating dysphagia 2 with nectar thick liquids, medicine crushed in puree. Although pt demonstrates better oral control of dysphagia 2, pt does have fairly significant right facial that might impact consumption of full tray of dysphagia 2 textures.   Swallow Evaluation Recommendations       SLP Diet Recommendations: Dysphagia 2 (Fine chop) solids;Nectar thick liquid   Liquid Administration via: Cup   Medication Administration: Crushed with  puree   Supervision: Staff to assist with self feeding;Full supervision/cueing for compensatory strategies   Compensations: Minimize environmental distractions;Slow rate;Small sips/bites   Postural Changes: Seated upright at 90 degrees   Oral Care Recommendations: Oral care BID   Other Recommendations: Order thickener from pharmacy;Remove water pitcher;Have oral suction available;Prohibited food (jello, ice cream, thin soups)    Adalind Weitz 09/26/2019,1:01 PM

## 2019-09-26 NOTE — Progress Notes (Signed)
Occupational Therapy Session Note  Patient Details  Name: April Le MRN: [id number] Date of Birth: 09/01/1941  Today's Date: 09/26/2019 OT Individual Time: 1505-1600 OT Individual Time Calculation (min): 55 min    Short Term Goals: Week 2:  OT Short Term Goal 1 (Week 2): Pt will perform bathing tasks with mod A overall. OT Short Term Goal 2 (Week 2): Pt will perform LB dressing with mod A overall. OT Short Term Goal 3 (Week 2): Pt will perform grooming tasks at sink with min A overall. OT Short Term Goal 4 (Week 2): Pt will perform toileting with min A overall.  Skilled Therapeutic Interventions/Progress Updates:    Pt received at RN station and appears to be very restless. OT asking pt if she needed to use bathroom and she very clearly said " NO". OT assisted pt to room via wheelchair to ensure she had not been incontinent and she was dry. OT handing pt lotion but she did not initiate using it until therapist squirted lotion onto B UEs and LEs and then pt rubbing in on her own with increased time. OT assisted pt to dayroom for quiet environment and nectar liquids placed in hand. Pt would not bring to her mouth but would sip from spoon this session. No coughing noted. Pt did attempt to drink from spoon as it if were a straw several times. Pt was left at RN station again for safety as she refused to return to room. RN aware. All needs within reach.   Therapy Documentation Precautions:  Precautions Precautions: Fall Precaution Comments: cortrak Restrictions Weight Bearing Restrictions: No Vital Signs: Therapy Vitals Temp: 98.6 F (37 C) Pulse Rate: 82 Resp: 19 BP: (!) 100/55 Patient Position (if appropriate): Sitting Oxygen Therapy SpO2: 94 % O2 Device: Room Air Pain: Pain Assessment Pain Scale: 0-10 Pain Score: 0-No pain Faces Pain Scale: No hurt   Therapy/Group: Individual Therapy  Gypsy Decant 09/26/2019, 4:31 PM

## 2019-09-26 NOTE — Progress Notes (Signed)
Speech Language Pathology Weekly Progress and Session Note  Patient Details  Name: Sendy Pluta MRN: [id number] Date of Birth: 04-26-1941  Beginning of progress report period: September 18, 2019 End of progress report period: September 26, 2019  Today's Date: 09/26/2019 SLP Individual Time: 7829-5621 SLP Individual Time Calculation (min): 25 min  Short Term Goals: Week 1: SLP Short Term Goal 1 (Week 1): Pt will consume thin trials (Ice chips) with minimal overt s/s aspiration to indicate readiness for instrumental assessment. SLP Short Term Goal 1 - Progress (Week 1): Met SLP Short Term Goal 2 (Week 1): Pt will demonstrate sustained attention in 1 minute interval with mod A verbal cues for redirection. SLP Short Term Goal 2 - Progress (Week 1): Met SLP Short Term Goal 3 (Week 1): Pt will express wants/needs via multimodal communciation with max A verbal/visual cues. SLP Short Term Goal 3 - Progress (Week 1): Not met SLP Short Term Goal 4 (Week 1): Pt will follow 1 step commands with max A verbal/visual cues in 3 out 5 opportunties. SLP Short Term Goal 4 - Progress (Week 1): Met SLP Short Term Goal 5 (Week 1): Pt will produce automatic language sequences with 30% accuracy. SLP Short Term Goal 5 - Progress (Week 1): Discontinued (comment)    New Short Term Goals: Week 2: SLP Short Term Goal 1 (Week 2): Pt will consume dysphagia 2 diet with nectar thick liquids with supervision level cues for use of compensatory swallow strategies. SLP Short Term Goal 2 (Week 2): Pt will consume trials of dysphagia 3 over 3 sessions with minimal overt s/s of aspiration and Min A cues for use of compensatory swallow strategies. SLP Short Term Goal 3 (Week 2): Pt will consume trials of thin liquids with minimal overt s/s of aspiration over 5 sessions to demonstrate readiness for repeat instrumental swallow study. SLP Short Term Goal 4 (Week 2): Pt will demonstrate sustained attention to task for 5 minutes with  Min A cues. SLP Short Term Goal 5 (Week 2): Pt will utilize multi-modal communication to express basic wants/needs in 5 out of 10 opportunities and Mod A cues. SLP Short Term Goal 6 (Week 2): Pt will utilize word finding strategies to produce phrase length utterances that are ~ 25% intelligibility with Max A cues.  Weekly Progress Updates:  Pt has made good progress towards her goals targeting safe consumption of PO intake. Pt participate in MBS and as a result dysphagia 2 diet with nectar thick liquids were recommended, medicine crushed in puree. Although pt demonstrated safety and good airway protect when consuming 5 cc of thin liquids and would be appropriate for use of 5cc Provale cup, recommend nectar thick liquids as pt will consume more ounces and maintain better hydration. Would implement water protocol via spoon at bedside with repeat MBS prior to full upgrade. Pt's expressive communicate continues to be more impaired than receptive abilities. She is able to follow 1 step directions and demonstrate increased sustained attention over admission. At this time, pt is not able to verbally communicate wants/needs. Skilled ST continues to be indicated to target the above mentioned deficits, increase functional independence and reduce caregiver burden.       Intensity: Minumum of 1-2 x/day, 30 to 90 minutes Frequency: 3 to 5 out of 7 days Duration/Length of Stay: 10/09/19 Treatment/Interventions: Cognitive remediation/compensation;Cueing hierarchy;Dysphagia/aspiration precaution training;Functional tasks;Internal/external aids;Patient/family education;Speech/Language facilitation   Daily Session  Skilled Therapeutic Interventions:   Skilled treatment focused on dysphagia goals. SLP provided skilled observation of  pt consuming dysphagia 2 lunch tray with nectar thick liquids. Pt self-fed herself 6 oz of nectar thick liquids without any overt s/s of aspiration. Pt also consumed dysphagia 2  textures with trace right anterior spillage that pt sensed at lips and pt in mouth (very appropriate). Pt with active mastication of each bolus and complete oral clearing. Pt consumed ~ 25% of meal and 6 oz of nectar thick liquids. Pt was fatigued and this Probation officer transferred her back to bed. SLP provided education to pt's daughter and son (son had been present for MBS). Education provided on pt's progress with swallow function, current diet recommendation, aspiration precuations, full supervision by staff and all questions answered to their satisfaction.   Pt also attempted to verbally communicate during session but speech was filled with paraphasias and message was not conveyed. Education provided to pt's family on ways to be supported during such attempts and ways to present language and facilitate communicate.   Information shared with pt's nurse and posted in room on current diet recommendation.       General    Pain    Therapy/Group: Individual Therapy  Ryliegh Mcduffey 09/26/2019, 1:16 PM

## 2019-09-26 NOTE — Progress Notes (Signed)
Physical Therapy Weekly Progress Note  Patient Details  Name: April Le MRN: [id number] Date of Birth: [birth year]  Beginning of progress report period: September 18, 2019 End of progress report period: September 26, 2019  Today's Date: 09/26/2019 PT Individual Time: 8889-1694 AND 1315-1345 PT Individual Time Calculation (min): 30 min and 30 min   Patient has met 3 of 3 short term goals.  Pt is making slow progress towards LTG. 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 Pt is more participatory when family is present, and can ambulate up to 50f with min-mod assist + 2 for WC. After initial decline in status following seizure, pt has made adequate progress given medical complexity.   Patient continues to demonstrate the following deficits muscle weakness, muscle joint tightness and muscle paralysis, decreased cardiorespiratoy endurance, impaired timing and sequencing, unbalanced muscle activation, motor apraxia, ataxia, decreased coordination and decreased motor planning, decreased visual acuity and hemianopsia, decreased attention to right and ideational apraxia, decreased attention, decreased awareness, decreased problem solving, decreased safety awareness and decreased memory and decreased sitting balance, decreased standing balance, decreased postural control, hemiplegia and decreased balance strategies and therefore will continue to benefit from skilled PT intervention to increase functional independence with mobility.  Patient progressing toward long term goals..  Continue plan of care.  PT Short Term Goals Week 1:  PT Short Term Goal 1 (Week 1): Pt will perform bed mobility with mod assist PT Short Term Goal 1 - Progress (Week 1): Met PT Short Term Goal 2 (Week 1): [facility] PT Short Term Goal 2 - Progress (Week 1): Met PT Short Term Goal 3 (Week  1): Pt will remain OOB up to 2 hours PT Short Term Goal 3 - Progress (Week 1): Met Week 2:  PT Short Term Goal 1 (Week 2): Pt will ambulate >577fwith min assist and LRAD PT Short Term Goal 2 (Week 2): Pt will attend to task for >5 minutes wiht mod cues from PT PT Short Term Goal 3 (Week 2): PT's family will initiate education to prepare for d/c home with min assist  Skilled Therapeutic Interventions/Progress Updates:   Pt received supine in bed and initially agreeable to PT. Supine to sitting EOB with min assist. Once EOB pt instructed in need [facility] attempting to communicate using nonsensical language and began to retrun to bed and pull covers over her legs. PT asking pt if she needs and rest and pt states "Yeah!" pt left in bed with call bell in reach and all needs met. PT returned in 30 min to re-attempt session and resisting any attempt of this pt to set up [facility] to [facility] pulling covers back over her lap and trunk. PT educated pt on need to OOB mobility, with no evidence of understanding from patient.     PT found pt at RN station sitting in [facility] agreeable to attempt PT.  Pt performed peg board puzzle x 10 minute with hand over hand assist and max cues for awareness of alternating pattern. 75% successful with maintaining low difficulty pattern. Pt performed sit<>stand from [facility] attempt dynamic balance task of horse shoe toss to target. Once in standing, pt refused to let go of WC armrest, and resistant to hand over hand education for completion of task in standing and sitting. Pt shaking  head no to all other instruction for therapeutic tasks, inlcuding gait, stairs, WC propulsion, balance training. Patient returned to RN station and left sitting in [facility] with call bell in reach and all needs met.      Patient returned to room and left sitting in [facility] with call bell in reach and all need      Therapy Documentation Precautions:  Precautions Precautions: Fall Precaution  Comments: cortrak Restrictions Weight Bearing Restrictions: No Vital Signs: Therapy Vitals Temp: 98.6 F (37 C) Pulse Rate: 82 Resp: 19 BP: (!) 100/55 Patient Position (if appropriate): Sitting Oxygen Therapy SpO2: 94 % O2 Device: Room Air Pain: Pain Assessment Pain Scale: 0-10 Pain Score: 0-No pain Faces Pain Scale: No hurt   Therapy/Group: Individual Therapy  Lorie Phenix 09/26/2019, 3:17 PM

## 2019-09-27 ENCOUNTER — Inpatient Hospital Stay (HOSPITAL_COMMUNITY): Payer: Medicare Other | Admitting: Physical Therapy

## 2019-09-27 ENCOUNTER — Inpatient Hospital Stay (HOSPITAL_COMMUNITY): Payer: Medicare Other | Admitting: Occupational Therapy

## 2019-09-27 ENCOUNTER — Inpatient Hospital Stay (HOSPITAL_COMMUNITY): Payer: Medicare Other | Admitting: Speech Pathology

## 2019-09-27 LAB — BASIC METABOLIC PANEL
Anion gap: 18 — ABNORMAL HIGH (ref 5–15)
BUN: 35 mg/dL — ABNORMAL HIGH (ref 8–23)
CO2: 26 mmol/L (ref 22–32)
Calcium: 9.4 mg/dL (ref 8.9–10.3)
Chloride: 98 mmol/L (ref 98–111)
Creatinine, Ser: 1.3 mg/dL — ABNORMAL HIGH (ref 0.44–1.00)
GFR calc Af Amer: 45 mL/min — ABNORMAL LOW (ref >60)
GFR calc non Af Amer: 39 mL/min — ABNORMAL LOW (ref >60)
Glucose, Bld: 121 mg/dL — ABNORMAL HIGH (ref 70–99)
Potassium: 3.6 mmol/L (ref 3.5–5.1)
Sodium: 142 mmol/L (ref 135–145)

## 2019-09-27 MED ORDER — ENSURE ENLIVE PO LIQD
237.0000 mL | Freq: Two times a day (BID) | ORAL | Status: DC
  Administered 2019-09-27 – 2019-10-03 (×6): 237 mL via ORAL

## 2019-09-27 MED ORDER — QUETIAPINE FUMARATE 50 MG PO TABS
50.0000 mg | ORAL_TABLET | Freq: Every day | ORAL | Status: DC
  Administered 2019-09-27 – 2019-10-01 (×5): 50 mg
  Filled 2019-09-27 (×5): qty 1

## 2019-09-27 NOTE — Progress Notes (Signed)
Physical Therapy Session Note  Patient Details  Name: April Le MRN: [id number] Date of Birth: [birth year]  Today's Date: 09/27/2019 PT Individual Time: 1130-1200 AND 1530-1615 PT Individual Time Calculation (min): 30 min and 45 min    Short Term Goals: Week 2:  PT Short Term Goal 1 (Week 2): Pt will ambulate >71f with min assist and LRAD PT Short Term Goal 2 (Week 2): Pt will attend to task for >5 minutes wiht mod cues from PT PT Short Term Goal 3 (Week 2): PT's family will initiate education to prepare for d/c home with min assist  Skilled Therapeutic Interventions/Progress Updates:  Session 1  Pt received supine in bed and agreeable to PT. Supine>sit transfer with mod assist to facilitate initiation of movement to improve pt's awareness of task goal. Sit<>stand from EOB and min assist using bed rail. Pt noted to have been incontinent of bladder. Additional sit<>stand x 2 to remove and replace brief. PT required to perform clothing management as pt remained standing with supervision assist and BUE supported on RW.   Stand pivot [facility] to [facility] and min assist for for safety. Pt noted to have poor R foot clearance in turn. Sit<>[facility] assist x 2 to attempt reciprocal marching and functional reaching tasks. Pt unable to participate in task as no evidence of understanding of task goals. Min assist to prevent posterior balance and pt perform forward/backward stepping as PT attempted to redirect pt.   Pt returned to room and performed stand pivot transfer wtih HHA on the R and mod assist to improve step length on the R. Sit>supine completed with min assist for safety  and left supine in bed with call bell in reach and all needs met.    Session 2.  Pt received sitting in WC and agreeable to PT. Pt [facility] rehab gym [facility] G[facility] training with RW x 739fwith min assist and RW. Intermittent standing Rest breaks for PT to reposition RW and cues for increased step  length, only mild change with verbal cues. MD then present to assess surgical site on chest while pt resting.   Sit<>stand in parallel bars x 5 with min assist to initiate. Pt performed reciprocal marches in parallel bars 2 x 10 BLE with physical and visual instruction. Pt then performed step up/down on 4 inch step x 4 with BUE supported on rails, min assist for safety and moderate visual cues for technique  Patient returned to room and left sitting in [facility] in reach and all needs met.           Therapy Documentation Precautions:  Precautions Precautions: Fall Precaution Comments: cortrak Restrictions Weight Bearing Restrictions: No   Pain: Pain Assessment Pain Scale: 0-10 Pain Score: 0-No pain Faces Pain Scale: No hurt  Therapy/Group: Individual Therapy  AuLorie Phenix1/13/2020, 11:57 AM

## 2019-09-27 NOTE — Progress Notes (Signed)
Nutrition Follow-up  DOCUMENTATION CODES:   Not applicable  INTERVENTION:  Provide Ensure Enlive po BID (thickened to nectar thick consistency), each supplement provides 350 kcal and 20 grams of protein.  Encourage adequate PO intake.   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 Presented 09/11/2019 nonverbal with right side weakness. CT angiogram of head and neck showed left distal M1 embolism extending into proximal M2 branches with poorly enhancing downstream vessels. Remote moderate left lateral frontal and upper insular infarction. CT perfusion showed a 38 cc acute infarct and 56 cc of superimposed penumbra. Underwent cerebral angiogram with complete revascularization of occluded Lt LMCA M1 segment. MRI follow-up showed acute cortical infarct involving majority of left MCA distribution. Patient is currently n.p.o. with nasogastric tube feeds.  Diet advanced yesterday. Tube feeding has been discontinued and NGT has been removed. Meal completion has been 25-75%. Pt with full supervision at meals. Nurse tech reports feedings and meals have been going well. RD to order nutritional supplements to aid in caloric and protein needs.   Labs and medications reviewed.  Diet Order:   Diet Order            DIET DYS 2 Room service appropriate? Yes; Fluid consistency: Nectar Thick   Diet effective now              EDUCATION NEEDS:   Not appropriate for education at this time  Skin:  Skin Assessment: Reviewed RN Assessment  Last BM:  11/12  Height:   Ht Readings from Last 1 Encounters:  09/17/19 5' (1.524 m)    Weight:   Wt Readings from Last 1 Encounters:  09/25/19 70 kg    Ideal Body Weight:  45.45 kg  BMI:  Body mass index is 30.14 kg/m.  Estimated Nutritional Needs:   Kcal:  1500-1800  Protein:  70-90 grams  Fluid:  >/= 1.5 L/day    Corrin Parker, MS, RD, LDN Pager # [phone] After hours/ weekend pager # [phone]

## 2019-09-27 NOTE — Progress Notes (Addendum)
Recheck on loop site Steri strips removed Wound looks clean, no bleeding, skin edges are not quite approximated with some granulation tissue noted.  No open areas are noted No erythema, edema , or increased heat to the surrounding tissues No evidence of infection Loop transmitter is at her bedside plugged in  I place another steri strip to encourage complete healing, will have our service look at her wound monday  Tommye Standard, PA-C

## 2019-09-27 NOTE — Progress Notes (Signed)
Occupational Therapy Session Note  Patient Details  Name: April Le MRN: [id number] Date of Birth: [birth year]  Today's Date: 09/27/2019 OT Individual Time: [id number] OT Individual Time Calculation (min): 73 min   Short Term Goals: Week 2:  OT Short Term Goal 1 (Week 2): Pt will perform bathing tasks with mod A overall. OT Short Term Goal 2 (Week 2): Pt will perform LB dressing with mod A overall. OT Short Term Goal 3 (Week 2): Pt will perform grooming tasks at sink with min A overall. OT Short Term Goal 4 (Week 2): Pt will perform toileting with min A overall.  Skilled Therapeutic Interventions/Progress Updates:    Pt greeted in bed with no s/s pain. Visibly trying to communicate and pointing to her call bell but OT was unable to comprehend due to nonsensical speech/aphasia. Asked her if she wanted to use the toilet and pt shook her head, nodded when asked if she wanted to wash up. Noted that nasal cannula was removed with pt satting at 94% at rest on RA. Supine<sit completed with Min A and she then participated in bathing/dressing tasks EOB at sit<stand level using RW. She needed initial [facility] demonstrational cuing for washing her UB and upper legs. Pt interchanged between Rt and Lt UE use. Able to wash her face with verbal cuing alone. Sit<stand with Min A using RW while 2nd helper completed perihygiene. Mod A for standing balance while pt completed hygiene in the front with demonstrational cuing as well. She applied her deoderant with Min A for reaching the Rt underarm. Mod A for donning overhead shirt. Pt then threaded both LEs into pants with vcs! Mod A for standing balance once again while she pulled them up with Mod A of 2nd helper. Pt initiated pulling pants up with both hands while standing. Stand pivot<w/c completed with Mod A of 1 using RW and 2nd helper present for safety. Pt remained in the w/c with spouse present, safety belt fastened, 02 sats 93% on RA.     Pt satted in the low 90s  during B/D tasks while on RA, dropping to 86-88% after standing, but increasing to 90s again within a minute. No s/s SOB during tx.   Therapy Documentation Precautions:  Precautions Precautions: Fall Precaution Comments: cortrak Restrictions Weight Bearing Restrictions: No ADL:       Therapy/Group: Individual Therapy  Chery Giusto A Pavlos Yon 09/27/2019, 3:53 PM

## 2019-09-27 NOTE — Plan of Care (Signed)
  Problem: Consults Goal: RH STROKE PATIENT EDUCATION Description: See Patient Education module for education specifics  Outcome: Progressing   Problem: RH BOWEL ELIMINATION Goal: RH STG MANAGE BOWEL WITH ASSISTANCE Description: STG Manage Bowel with [place]. Outcome: Progressing Goal: RH STG MANAGE BOWEL W/MEDICATION W/ASSISTANCE Description: STG Manage Bowel with Medication with min Assistance. Outcome: Progressing   Problem: RH BLADDER ELIMINATION Goal: RH STG MANAGE BLADDER WITH ASSISTANCE Description: STG Manage Bladder With min Assistance Outcome: Progressing   Problem: RH SAFETY Goal: RH STG ADHERE TO SAFETY PRECAUTIONS W/ASSISTANCE/DEVICE Description: STG Adhere to Safety Precautions With min Assistance/Device. Outcome: Progressing   Problem: RH COGNITION-NURSING Goal: RH STG ANTICIPATES NEEDS/CALLS FOR ASSIST W/ASSIST/CUES Description: STG Anticipates Needs/Calls for Assist With min Assistance/Cues. Outcome: Progressing   Problem: RH PAIN MANAGEMENT Goal: RH STG PAIN MANAGED AT OR BELOW PT'S PAIN GOAL Description: < 3 out of 10.  Outcome: Progressing

## 2019-09-27 NOTE — Progress Notes (Addendum)
[place] PHYSICAL MEDICINE & REHABILITATION PROGRESS NOTE   Subjective/Complaints:  No issues overnight. No complaints this morning.  As per nurse she has been sundowning overnight.  She is on IV NS with K+ supplementation; last K+ 1 week prior was within normal limits.   ROS- limited by cognition   Objective:   Dg Swallowing Func-speech Pathology  Result Date: 09/26/2019 Objective Swallowing Evaluation: Type of Study: MBS-Modified Barium Swallow Study  Patient Details Name: April Le MRN: [id number] Date of Birth: 30-Jun-1941 Today's Date: 09/26/2019 Time: SLP Start Time (ACUTE ONLY): [id number] -SLP Stop Time (ACUTE ONLY): 1300 SLP Time Calculation (min) (ACUTE ONLY): 25 min Past Medical History: Past Medical History: Diagnosis Date . Abdominal distention   past two months . Abnormality of gait 10/28/2015 . 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 Arthritis   right knee . Benign essential HTN 11/19/2013 . CAP (community acquired pneumonia) 09/07/2013 . CHF (congestive heart failure) ([place])  . Chronic diastolic heart failure ([place]) 11/19/2013 . Chronic kidney disease   STAGE III KIDNEY DISEASE-PER PT--PT SEES DR. Risa Grill UROLOGIST . Complication of anesthesia   PT REMEMBERS BREATHING PROBLEMS WAKING UP FROM KNEE SURGERY AT [facility] 2011 OR 2012 . Coronary artery calcification seen on CT scan 01/10/2017 . Fever of unknown origin 09/04/2013 . GERD (gastroesophageal reflux disease)  . Headache  . Headache(784.0)  . 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 Hypercholesteremia  . Hypertension  . Hypertensive heart disease with heart failure ([place]) 01/10/2017 . Hypothyroidism  . Hypoxia 09/04/2013 . Leukocytosis 09/04/2013 . Mass of ovary 10/26/2011 . OSA (obstructive sleep apnea) 11/19/2013 . Pneumonia  . Shortness of breath   WITH EXERTION . Sinus drainage   PT STARTED ANTIBIOTIC 11/17/11 --IS HOARSE TODAY, SOME WHEEZING AND COUGH WITH  YELLOW DRAINAGE . SIRS (systemic inflammatory response syndrome) ([place]) 09/04/2013 . SOB (shortness of breath) 09/04/2013 . Stroke ([place]) 02/2007, 08/2009  2 total -RESIDUAL WEAKNESS LEFT LEG--AND APHASIA . Stroke ([place]) 09/01/2017 . Stroke ([place]) 09/08/2017 . Tumor of ovary  . Urinary incontinence  . Urinary incontinence  . Weakness of left leg   Mildly, post stroke Past Surgical History: Past Surgical History: Procedure Laterality Date . ABDOMINAL HYSTERECTOMY   . CHOLECYSTECTOMY   . IR CT HEAD LTD  09/11/2019 . IR PERCUTANEOUS ART THROMBECTOMY/INFUSION INTRACRANIAL INC DIAG ANGIO  09/11/2019 . LAPAROTOMY  11/22/2011  Procedure: EXPLORATORY LAPAROTOMY;  Surgeon: Imagene Gurney A. Alycia Rossetti, MD;  Location: [facility];  Service: Gynecology;  Laterality: N/A; . LOOP RECORDER INSERTION N/A 09/16/2019  Procedure: LOOP RECORDER INSERTION;  Surgeon: Evans Lance, MD;  Location: [facility];  Service: Cardiovascular;  Laterality: N/A; . MENISCUS REPAIR   . RADIOLOGY WITH ANESTHESIA N/A 09/11/2019  Procedure: IR WITH ANESTHESIA;  Surgeon: Radiologist, Medication, MD;  Location: [facility];  Service: Radiology;  Laterality: N/A; . SALPINGOOPHORECTOMY  11/22/2011  Procedure: SALPINGO OOPHERECTOMY;  Surgeon: Imagene Gurney A. Alycia Rossetti, MD;  Location: [facility];  Service: Gynecology;  Laterality: Bilateral; . TOE SURGERY   . TONSILLECTOMY   . TUBAL LIGATION   HPI: April Le is a 78 y.o. female with past medical history significant for congestive heart failure(chronic diastolic heart failure), prior stroke, hypertension, hyperlipidemia and prior stroke with residual mild left leg weakness and mild aphasia. She presented to the emergency department as a code stroke for left gaze deviation, right-sided weakness and aphasia on 09/11/19 when she was found non-verbal with right  side weakness and facial droop. Stat CT head was obtained which showed no acute hemorrhage/infarct.  CT head did demonstrate old infarcts most notably left MCA stroke and R PCA stroke.  CT  angiogram and CT perfusion showed left M1 occlusion. Intubated briefly for arteriogram and TICI revascularization.   No data recorded Assessment / Plan / Recommendation CHL IP CLINICAL IMPRESSIONS 09/26/2019 Clinical Impression Pt presents with siginficant oral phase dysphagia that results in decreased containment of nectar thick liquids and thin liquids. As a result, pt has premature spillage of both nectar and thin liquids to pyriform sinuses before swallow initiation occurs. During the swallow both consistencies are penetrated, however nectar thick liquids are not penetrated as often and are not penetrated as deeply as thin liquids. On several occasions, thin liquids were penetrated to the level of the cords with some accumulation of the residuals noted. Poor oral control also results in more oral residue with nectar thick and thin liquids that contributes to pharyngeal residue post swallow in the vallucula and pyriform sinuses. Even with time lapse and pt talking, pharyngeal residuals were not aspirated.  Pt also observed consuming 5 cc of thin liquids and 10 cc of thin liquids for potential use of Provale cup. Pt would be appropriate for water protocol via spoon but not 10 cc amounts. Pt actually demonstrated better bolus cohesion, AP transit and more timely swallow initiation with puree and dysphagia 2 with much less oropharyngeal residue. At this time, recommend initiating dysphagia 2 with nectar thick liquids, medicine crushed in puree. Although pt demonstrates better oral control of dysphagia 2, pt does have fairly significant right facial that might impact consumption of full tray of dysphagia 2 textures. SLP Visit Diagnosis Dysphagia, oropharyngeal phase (R13.12) Attention and concentration deficit following -- Frontal lobe and executive function deficit following -- Impact on safety and function Mild aspiration risk   CHL IP TREATMENT RECOMMENDATION 09/26/2019 Treatment Recommendations Therapy as  outlined in treatment plan below   Prognosis 09/12/2019 Prognosis for Safe Diet Advancement Good Barriers to Reach Goals Time post onset Barriers/Prognosis Comment -- CHL IP DIET RECOMMENDATION 09/26/2019 SLP Diet Recommendations Dysphagia 2 (Fine chop) solids;Nectar thick liquid Liquid Administration via Cup Medication Administration Crushed with puree Compensations Minimize environmental distractions;Slow rate;Small sips/bites Postural Changes Seated upright at 90 degrees   CHL IP OTHER RECOMMENDATIONS 09/26/2019 Recommended Consults -- Oral Care Recommendations Oral care BID Other Recommendations Order thickener from pharmacy;Remove water pitcher;Have oral suction available;Prohibited food (jello, ice cream, thin soups)   CHL IP FOLLOW UP RECOMMENDATIONS 09/16/2019 Follow up Recommendations Inpatient Rehab   CHL IP FREQUENCY AND DURATION 09/12/2019 Speech Therapy Frequency (ACUTE ONLY) min 2x/week Treatment Duration 2 weeks      CHL IP ORAL PHASE 09/26/2019 Oral Phase Impaired Oral - Pudding Teaspoon -- Oral - Pudding Cup -- Oral - Honey Teaspoon -- Oral - Honey Cup -- Oral - Nectar Teaspoon Holding of bolus;Premature spillage;Decreased bolus cohesion;Delayed oral transit;Weak lingual manipulation Oral - Nectar Cup Weak lingual manipulation;Premature spillage;Decreased bolus cohesion;Delayed oral transit;Holding of bolus Oral - Nectar Straw -- Oral - Thin Teaspoon Weak lingual manipulation;Holding of bolus;Delayed oral transit;Decreased bolus cohesion;Premature spillage Oral - Thin Cup Weak lingual manipulation;Holding of bolus;Premature spillage;Decreased bolus cohesion;Delayed oral transit Oral - Thin Straw -- Oral - Puree WFL Oral - Mech Soft WFL Oral - Regular -- Oral - Multi-Consistency -- Oral - Pill -- Oral Phase - Comment --  CHL IP PHARYNGEAL PHASE 09/26/2019 Pharyngeal Phase Impaired Pharyngeal- Pudding Teaspoon -- Pharyngeal -- Pharyngeal-  Pudding Cup -- Pharyngeal -- Pharyngeal- Honey Teaspoon --  Pharyngeal -- Pharyngeal- Honey Cup -- Pharyngeal -- Pharyngeal- Nectar Teaspoon Delayed swallow initiation-pyriform sinuses;Penetration/Aspiration during swallow;Pharyngeal residue - valleculae;Pharyngeal residue - pyriform Pharyngeal Material enters airway, remains ABOVE vocal cords then ejected out Pharyngeal- Nectar Cup Delayed swallow initiation-pyriform sinuses;Penetration/Aspiration during swallow;Pharyngeal residue - pyriform;Pharyngeal residue - valleculae Pharyngeal Material enters airway, remains ABOVE vocal cords then ejected out Pharyngeal- Nectar Straw -- Pharyngeal -- Pharyngeal- Thin Teaspoon Delayed swallow initiation-pyriform sinuses;Penetration/Aspiration during swallow;Pharyngeal residue - valleculae;Pharyngeal residue - pyriform Pharyngeal Material enters airway, remains ABOVE vocal cords then ejected out Pharyngeal- Thin Cup Delayed swallow initiation-pyriform sinuses;Penetration/Aspiration during swallow;Pharyngeal residue - valleculae;Pharyngeal residue - pyriform Pharyngeal Material enters airway, remains ABOVE vocal cords and not ejected out;Material enters airway, remains ABOVE vocal cords then ejected out Pharyngeal- Thin Straw -- Pharyngeal -- Pharyngeal- Puree WFL Pharyngeal -- Pharyngeal- Mechanical Soft WFL Pharyngeal -- Pharyngeal- Regular -- Pharyngeal -- Pharyngeal- Multi-consistency -- Pharyngeal -- Pharyngeal- Pill -- Pharyngeal -- Pharyngeal Comment --  CHL IP CERVICAL ESOPHAGEAL PHASE 09/26/2019 Cervical Esophageal Phase WFL Pudding Teaspoon -- Pudding Cup -- Honey Teaspoon -- Honey Cup -- Nectar Teaspoon -- Nectar Cup -- Nectar Straw -- Thin Teaspoon -- Thin Cup -- Thin Straw -- Puree -- Mechanical Soft -- Regular -- Multi-consistency -- Pill -- Cervical Esophageal Comment -- Happi Overton 09/26/2019, 1:05 PM              [place] Ekg Site Rite  Result Date: 09/25/2019 If Site Rite image not attached, placement could not be confirmed due to current cardiac rhythm.  No results  for input(s): WBC, HGB, HCT, PLT in the last 72 hours. No results for input(s): NA, K, CL, CO2, GLUCOSE, BUN, CREATININE, CALCIUM in the last 72 hours.  Intake/Output Summary (Last 24 hours) at 09/27/2019 0954 Last data filed at 09/27/2019 [id number] Gross per 24 hour  Intake 1649.49 ml  Output -  Net 1649.49 ml     Physical Exam: Vital Signs Blood pressure 140/72, pulse (!) 57, temperature 97.8 F (36.6 C), resp. rate 19, height 5' (1.524 m), weight 70 kg, SpO2 95 %.   General: No acute distress Mood and affect are appropriate Heart: Regular rate and rhythm no rubs murmurs or extra sounds Lungs: Clear to auscultation, breathing unlabored, no rales or wheezes Abdomen: Positive bowel sounds, soft nontender to palpation, nondistended Extremities: No clubbing, cyanosis. Left arm with mild swelling and redness, with warmth and tenderness compared to right arm.   Skin: No evidence of breakdown, no evidence of rash Neurologic: cannot cooperate with exam, withdraws to pinch in all 4 limbs 4/5 in RUE and RLE (formal testing difficult due to problems following commands)  Unable to open mouth or move limbs except with hand over hand cues  Musculoskeletal: Full range of motion in all 4 extremities. No joint swelling   Assessment/Plan: 1. 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  Physiatrist is providing close team supervision and 24 hour management of active medical problems listed below.  Physiatrist and rehab team continue to assess barriers to discharge/monitor patient progress toward functional and medical goals  Care Tool:  Bathing    Body parts bathed by patient: Face, Chest   Body parts bathed by helper: Right arm, Left arm, Chest, Right lower leg, Left lower leg, Right upper leg, Left upper leg, Abdomen     Bathing assist Assist Level: Maximal  Assistance - Patient 24  - 49%     Upper Body Dressing/Undressing Upper body dressing Upper body dressing/undressing activity did not occur (including orthotics): Refused What is the patient wearing?: Pull over shirt    Upper body assist Assist Level: Moderate Assistance - Patient 50 - 74%    Lower Body Dressing/Undressing Lower body dressing    Lower body dressing activity did not occur: Refused What is the patient wearing?: Pants     Lower body assist Assist for lower body dressing: Maximal Assistance - Patient 25 - 49%     Toileting Toileting    Toileting assist Assist for toileting: 2 Helpers     Transfers Chair/bed transfer  Transfers assist  Chair/bed transfer activity did not occur: Safety/medical concerns  Chair/bed transfer assist level: Moderate Assistance - Patient 50 - 74%     Locomotion Ambulation   Ambulation assist   Ambulation activity did not occur: Safety/medical concerns  Assist level: Moderate Assistance - Patient 50 - 74% Assistive device: Walker-rolling Max distance: 42ft   Walk 10 feet activity   Assist  Walk 10 feet activity did not occur: Safety/medical concerns  Assist level: Moderate Assistance - Patient - 50 - 74% Assistive device: Walker-rolling   Walk 50 feet activity   Assist Walk 50 feet with 2 turns activity did not occur: Safety/medical concerns         Walk 150 feet activity   Assist Walk 150 feet activity did not occur: Safety/medical concerns         Walk 10 feet on uneven surface  activity   Assist Walk 10 feet on uneven surfaces activity did not occur: Safety/medical concerns         Wheelchair     Assist Will patient use wheelchair at discharge?: Yes   Wheelchair activity did not occur: Safety/medical concerns         Wheelchair 50 feet with 2 turns activity    Assist    Wheelchair 50 feet with 2 turns activity did not occur: Safety/medical concerns       Wheelchair 150 feet activity      Assist  Wheelchair 150 feet activity did not occur: Safety/medical concerns       Blood pressure 140/72, pulse (!) 57, temperature 97.8 F (36.6 C), resp. rate 19, height 5' (1.524 m), weight 70 kg, SpO2 95 %.  Medical Problem List and Plan: 1.  Right side weakness with aphasia secondary to acute      cortical infarct involving majority of the left MCA distribution/occluded left middle cerebral artery M1 segment status post revascularization as well as history of CVA with mild left-sided weakness and aphasia.  Status post loop recorder placement CIR PT OT speech. Discussed swallowing status with patient, husband and daughter.  The patient will need another swallowing test and if she makes some improvements may start p.o. and avoid PEG tube.  If there is no improvement may need to pursue PEG Appreciate SLP note, cough with ice chips yesterday  2.  Antithrombotics:  -DVT/anticoagulation: Lovenox.             -antiplatelet therapy: Aspirin 325 mg daily, Plavix 75 mg daily- 3. Pain Management: Tylenol as needed 4. Mood: Aricept 5 mg daily, Seroquel 25 mg nightly             -antipsychotic agents: seroquel QHS for sundowning; increased to 50mg  as continues to sundown on 25mg  dose.  5. Neuropsych: This patient is not capable of making decisions on  her own behalf. 6. Skin/Wound Care: Routine skin checks 7. Fluids/Electrolytes/Nutrition: Routine in and outs with follow-up chemistries. Repeat BMP today to monitor K+ and to adjust K+ supplementation in IV fluids accordingly.  8.  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  Monitor for any signs of fluid overload.  Lasix 40 mg twice daily Monitor daily weights 10.  Dysphagia.  Nasogastric tube feeds; is NPO- if C[place]rak needs to come out due to d/c, will need to place PEG.  Follow-up speech therapy 10.  CKD stage III.  Follow-up chemistries 11.  Hypertension.  Lopressor 50 mg twice daily.  Monitor with increased  mobility Vitals:   09/26/19 2054 09/27/19 0412  BP: (!) 123/99 140/72  Pulse: 93 (!) 57  Resp: 18 19  Temp: 98.1 F (36.7 C) 97.8 F (36.6 C)  SpO2: [id number]  systolic hypertension add low dose ACE   Pulse is OK 12.  Hypothyroidism.  Synthroid 13.  Hyperlipidemia.  Lipitor 14. Aphasia- global vs receptive aphasia with confusion- will have pt evaluated by SLP- might benefit from Amantadine? 15. Low grade temp-resolved without recurrence 16.  Morbid obesity BMI 29.7 17. Post stroke seizure complex partial discussed with PT who witnessed RUE flexion and shaking, R LE ext and shaking, head deviation to the Right  Received Ativan , on Keppra 500mg  BID as per Neuro, neuro has signed off but they will need to see her as an outpatient,  switched to [place] via tube 11/9 18.  OSA CPAP with oxygen at home. 19. Left arm IV infiltrated--will alleviate pain with warm packs 20. Bradycardia - on lopressor check ortho vitals and adjust dose as needed LOS: 10 days A FACE TO FACE EVALUATION WAS PERFORMED  Martha Clan P Sahmya Arai 09/27/2019, 9:54 AM

## 2019-09-27 NOTE — Progress Notes (Signed)
Speech Language Pathology Daily Session Note  Patient Details  Name: April Le MRN: [id number] Date of Birth: 07-Jan-1941  Today's Date: 09/27/2019 SLP Individual Time: 0900-0955 SLP Individual Time Calculation (min): 55 min  Short Term Goals: Week 2: SLP Short Term Goal 1 (Week 2): Pt will consume dysphagia 2 diet with nectar thick liquids with supervision level cues for use of compensatory swallow strategies. SLP Short Term Goal 2 (Week 2): Pt will consume trials of dysphagia 3 over 3 sessions with minimal overt s/s of aspiration and Min A cues for use of compensatory swallow strategies. SLP Short Term Goal 3 (Week 2): Pt will consume trials of thin liquids with minimal overt s/s of aspiration over 5 sessions to demonstrate readiness for repeat instrumental swallow study. SLP Short Term Goal 4 (Week 2): Pt will demonstrate sustained attention to task for 5 minutes with Min A cues. SLP Short Term Goal 5 (Week 2): Pt will utilize multi-modal communication to express basic wants/needs in 5 out of 10 opportunities and Mod A cues. SLP Short Term Goal 6 (Week 2): Pt will utilize word finding strategies to produce phrase length utterances that are ~ 25% intelligibility with Max A cues.  Skilled Therapeutic Interventions: Skilled treatment session focused on dysphagia and communication goals.  SLP facilitated session by providing skilled observation with breakfast meal of Dys. 2 textures with nectar-thick liquids. Patient consumed meal with intermittent prolonged AP transit with both solids and liquids resulting in an intermittent throat clear X 2.  Patient with mild oral residue that cleared with liquids washes. Patient required Mod A verbal and visual cues for problem solving with self-feeding, suspect due to motor planning. However, patient answered basic yes/no question in regards to wants/needs with 75% accuracy and also spontaneously verbalized at the word and phrase level ("pepper," "smooth,"  "that's good," etc).  Patient left upright in bed with alarm on and all needs within reach. Continue with current plan of care.      Pain Pain Assessment Pain Scale: 0-10 Pain Score: 0-No pain Faces Pain Scale: No hurt  Therapy/Group: Individual Therapy  [name] 09/27/2019, 12:26 PM

## 2019-09-28 ENCOUNTER — Inpatient Hospital Stay (HOSPITAL_COMMUNITY): Payer: Medicare Other | Admitting: Occupational Therapy

## 2019-09-28 DIAGNOSIS — N179 Acute kidney failure, unspecified: Secondary | ICD-10-CM

## 2019-09-28 DIAGNOSIS — R001 Bradycardia, unspecified: Secondary | ICD-10-CM

## 2019-09-28 DIAGNOSIS — F05 Delirium due to known physiological condition: Secondary | ICD-10-CM

## 2019-09-28 DIAGNOSIS — I10 Essential (primary) hypertension: Secondary | ICD-10-CM

## 2019-09-28 DIAGNOSIS — R569 Unspecified convulsions: Secondary | ICD-10-CM

## 2019-09-28 DIAGNOSIS — I69391 Dysphagia following cerebral infarction: Secondary | ICD-10-CM

## 2019-09-28 DIAGNOSIS — D62 Acute posthemorrhagic anemia: Secondary | ICD-10-CM

## 2019-09-28 NOTE — Progress Notes (Signed)
[place] PHYSICAL MEDICINE & REHABILITATION PROGRESS NOTE   Subjective/Complaints: Patient seen sitting up in bed this AM.  No reported issues overnight.   ROS- limited by cognition  Objective:   No results found. No results for input(s): WBC, HGB, HCT, PLT in the last 72 hours. Recent Labs    09/27/19 1814  NA 142  K 3.6  CL 98  CO2 26  GLUCOSE 121*  BUN 35*  CREATININE 1.30*  CALCIUM 9.4    Intake/Output Summary (Last 24 hours) at 09/28/2019 1103 Last data filed at 09/28/2019 0740 Gross per 24 hour  Intake 500 ml  Output -  Net 500 ml     Physical Exam: Vital Signs Blood pressure 135/62, pulse 87, temperature 97.8 F (36.6 C), temperature source Oral, resp. rate 17, height 5' (1.524 m), weight 68 kg, SpO2 100 %. Constitutional: No distress . Vital signs reviewed. HENT: Normocephalic.  Atraumatic. Eyes: EOMI. No discharge. Cardiovascular: No JVD. Respiratory: Normal effort.  No stridor. GI: Non-distended. Skin: Warm and dry.  Intact. Psych: Normal mood.  Normal behavior. Musc: Left upper extremity with some edema.  No tenderness.. Neurologic: Alert Global aphasia Motor: Limited due to participation, but appears to be moving all extremities spontaneously  Assessment/Plan: 1. 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  Physiatrist is providing close team supervision and 24 hour management of active medical problems listed below.  Physiatrist and rehab team continue to assess barriers to discharge/monitor patient progress toward functional and medical goals  Care Tool:  Bathing    Body parts bathed by patient: Face, Front perineal area   Body parts bathed by helper: Right arm, Left arm, Chest, Abdomen, Buttocks, Right upper leg, Left upper leg, Right lower leg, Left lower leg     Bathing assist Assist Level: 2 Helpers     Upper Body  Dressing/Undressing Upper body dressing Upper body dressing/undressing activity did not occur (including orthotics): Refused What is the patient wearing?: Pull over shirt    Upper body assist Assist Level: Moderate Assistance - Patient 50 - 74%    Lower Body Dressing/Undressing Lower body dressing    Lower body dressing activity did not occur: Refused What is the patient wearing?: Pants, Incontinence brief     Lower body assist Assist for lower body dressing: Maximal Assistance - Patient 25 - 49%     Toileting Toileting    Toileting assist Assist for toileting: 2 Helpers     Transfers Chair/bed transfer  Transfers assist  Chair/bed transfer activity did not occur: Safety/medical concerns  Chair/bed transfer assist level: Moderate Assistance - Patient 50 - 74%     Locomotion Ambulation   Ambulation assist   Ambulation activity did not occur: Safety/medical concerns  Assist level: Moderate Assistance - Patient 50 - 74% Assistive device: Walker-rolling Max distance: 56ft   Walk 10 feet activity   Assist  Walk 10 feet activity did not occur: Safety/medical concerns  Assist level: Moderate Assistance - Patient - 50 - 74% Assistive device: Walker-rolling   Walk 50 feet activity   Assist Walk 50 feet with 2 turns activity did not occur: Safety/medical concerns         Walk 150 feet activity   Assist Walk 150 feet activity did not occur: Safety/medical concerns         Walk 10 feet on uneven surface  activity   Assist Walk 10 feet  on uneven surfaces activity did not occur: Safety/medical concerns         Wheelchair     Assist Will patient use wheelchair at discharge?: Yes   Wheelchair activity did not occur: Safety/medical concerns         Wheelchair 50 feet with 2 turns activity    Assist    Wheelchair 50 feet with 2 turns activity did not occur: Safety/medical concerns       Wheelchair 150 feet activity      Assist  Wheelchair 150 feet activity did not occur: Safety/medical concerns       Blood pressure 135/62, pulse 87, temperature 97.8 F (36.6 C), temperature source Oral, resp. rate 17, height 5' (1.524 m), weight 68 kg, SpO2 100 %.  Medical Problem List and Plan: 1.  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  Status post loop recorder placement  Continue CIR 2.  Antithrombotics:  -DVT/anticoagulation: Lovenox.             -antiplatelet therapy: Aspirin 325 mg daily, Plavix 75 mg daily- 3. Pain Management: Tylenol as needed 4. Mood: Aricept 5 mg daily, Seroquel 25 mg nightly             -antipsychotic agents: seroquel QHS for sundowning; increased to 50mg  on 11/13 5. Neuropsych: This patient is not capable of making decisions on her own behalf. 6. Skin/Wound Care: Routine skin checks 7. Fluids/Electrolytes/Nutrition: Routine in and outs 8.  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  Monitor for any signs of fluid overload.  Lasix 40 mg twice daily Filed Weights   09/23/19 0500 09/25/19 0604 09/28/19 0514  Weight: 69.6 kg 70 kg 68 kg   Stable on 11/14 10.  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 ?AKI versus CKD stage III.    Creatinine 1.32 on 11/13, labs ordered for Monday  Encourage fluids, however may be difficult to maintain given nectar liquids  Will consider IVF if necessary 11.  Hypertension.  Lopressor 50 mg twice daily.  Monitor with increased mobility Vitals:   09/27/19 1928 09/28/19 0514  BP: 135/62   Pulse: 87   Resp: 18 17  Temp: 98.3 F (36.8 C) 97.8 F (36.6 C)  SpO2: 99% 100%   Controlled on 11/14 12.  Hypothyroidism.  Synthroid 13.  Hyperlipidemia.  Lipitor 14.  Obesity: BMI 29.7 15. Post stroke  seizure: complex partial   Continue Keppra 500mg  BID as per Neuro, neuro has signed off but they will need to see her as an outpatient 16.  OSA CPAP with oxygen at home. 17. Bradycardia -   Improving   Orthostatics negative  18.  Acute blood loss anemia  Hemoglobin 11.2 on 11/6, labs ordered for Monday  LOS:11 days A FACE TO FACE EVALUATION WAS PERFORMED   Lorie Phenix 09/28/2019, 11:03 AM

## 2019-09-28 NOTE — Progress Notes (Signed)
Occupational Therapy Session Note  Patient Details  Name: April Le MRN: [id number] Date of Birth: Dec 30, 1940  Today's Date: 09/28/2019 OT Individual Time: 1403-1430 OT Individual Time Calculation (min): 27 min   Short Term Goals: Week 2:  OT Short Term Goal 1 (Week 2): Pt will perform bathing tasks with mod A overall. OT Short Term Goal 2 (Week 2): Pt will perform LB dressing with mod A overall. OT Short Term Goal 3 (Week 2): Pt will perform grooming tasks at sink with min A overall. OT Short Term Goal 4 (Week 2): Pt will perform toileting with min A overall.  Skilled Therapeutic Interventions/Progress Updates:    Pt greeted in bed with no s/s pain. Spouse present. Pt smiling and waving at OT upon arrival. Min A to facilitate supine<sit due to motor planning deficits. Mod A for stand pivot<w/c without device. Continued working on praxis during oral care, eating, and grooming tasks while w/c level at the sink. Ideational apraxia with pt using toothbrush as comb once it was handed to her. She needed [facility] demonstrational cuing to properly use her toothbrush as she tried to do this again after toothpaste was applied. Used nectar thickened water for rinsing out mouth. Pt able to wash and dry her face with verbal cuing afterwards. Pt incorporating B hands while consuming magic cup and when drinking nectar thickened water via straw. Note that pt used straw as a spoon and needed HOH to use straw appropriately. She then gestured that she wanted to return to bed vs staying up in the w/c. Mod A for stand pivot<bed once again and pt then transitioned to supine. +2 for boosting her up in bed. Pt was left with all needs within reach, bed alarm set, and spouse present.   02 sats at start of session 92% on RA, 02 sats prior to departure 91-93% on RA. No s/s SOB.  Therapy Documentation Precautions:  Precautions Precautions: Fall Precaution Comments: cortrak Restrictions Weight Bearing Restrictions:  No Vital Signs: Therapy Vitals Temp: 98.2 F (36.8 C) Pulse Rate: (!) 59 Resp: 19 BP: (!) 129/55 Patient Position (if appropriate): Lying Oxygen Therapy SpO2: 94 % O2 Device: Room Air ADL:       Therapy/Group: Individual Therapy  Demyah Smyre A Greidys Deland 09/28/2019, 5:31 PM

## 2019-09-29 ENCOUNTER — Inpatient Hospital Stay (HOSPITAL_COMMUNITY): Payer: Medicare Other | Admitting: Speech Pathology

## 2019-09-29 ENCOUNTER — Inpatient Hospital Stay (HOSPITAL_COMMUNITY): Payer: Medicare Other | Admitting: Occupational Therapy

## 2019-09-29 DIAGNOSIS — I5032 Chronic diastolic (congestive) heart failure: Secondary | ICD-10-CM

## 2019-09-29 NOTE — Progress Notes (Signed)
[place] PHYSICAL MEDICINE & REHABILITATION PROGRESS NOTE   Subjective/Complaints: Patient seen laying in bed this AM.  No reported issues overnight.    ROS- limited by cognition.   Objective:   No results found. No results for input(s): WBC, HGB, HCT, PLT in the last 72 hours. Recent Labs    09/27/19 1814  NA 142  K 3.6  CL 98  CO2 26  GLUCOSE 121*  BUN 35*  CREATININE 1.30*  CALCIUM 9.4    Intake/Output Summary (Last 24 hours) at 09/29/2019 1126 Last data filed at 09/29/2019 0746 Gross per 24 hour  Intake 1110 ml  Output -  Net 1110 ml     Physical Exam: Vital Signs Blood pressure (!) 161/64, pulse (!) 59, temperature 98.1 F (36.7 C), resp. rate 19, height 5' (1.524 m), weight 67.5 kg, SpO2 98 %. Constitutional: No distress . Vital signs reviewed. HENT: Normocephalic.  Atraumatic. Eyes: EOMI. No discharge. Cardiovascular: No JVD. Respiratory: Normal effort.  No stridor. GI: Non-distended. Skin: Warm and dry.  Intact. Psych: Normal mood.  Normal behavior. Musc: Left upper extremity with some edema, improving.  No tenderness.. Neurologic: Alert. Global aphasia Motor: Limited due to participation, but appears to be moving all extremities spontaneously, unchanged  Assessment/Plan: 1. 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  Physiatrist is providing close team supervision and 24 hour management of active medical problems listed below.  Physiatrist and rehab team continue to assess barriers to discharge/monitor patient progress toward functional and medical goals  Care Tool:  Bathing    Body parts bathed by patient: Face, Front perineal area   Body parts bathed by helper: Right arm, Left arm, Chest, Abdomen, Buttocks, Right upper leg, Left upper leg, Right lower leg, Left lower leg     Bathing assist Assist Level: 2 Helpers     Upper  Body Dressing/Undressing Upper body dressing Upper body dressing/undressing activity did not occur (including orthotics): Refused What is the patient wearing?: Pull over shirt    Upper body assist Assist Level: Moderate Assistance - Patient 50 - 74%    Lower Body Dressing/Undressing Lower body dressing    Lower body dressing activity did not occur: Refused What is the patient wearing?: Pants, Incontinence brief     Lower body assist Assist for lower body dressing: Maximal Assistance - Patient 25 - 49%     Toileting Toileting    Toileting assist Assist for toileting: 2 Helpers     Transfers Chair/bed transfer  Transfers assist  Chair/bed transfer activity did not occur: Safety/medical concerns  Chair/bed transfer assist level: Moderate Assistance - Patient 50 - 74%     Locomotion Ambulation   Ambulation assist   Ambulation activity did not occur: Safety/medical concerns  Assist level: Moderate Assistance - Patient 50 - 74% Assistive device: Walker-rolling Max distance: 39ft   Walk 10 feet activity   Assist  Walk 10 feet activity did not occur: Safety/medical concerns  Assist level: Moderate Assistance - Patient - 50 - 74% Assistive device: Walker-rolling   Walk 50 feet activity   Assist Walk 50 feet with 2 turns activity did not occur: Safety/medical concerns         Walk 150 feet activity   Assist Walk 150 feet activity did not occur: Safety/medical concerns         Walk 10 feet on uneven surface  activity   Assist Walk  10 feet on uneven surfaces activity did not occur: Safety/medical concerns         Wheelchair     Assist Will patient use wheelchair at discharge?: Yes   Wheelchair activity did not occur: Safety/medical concerns         Wheelchair 50 feet with 2 turns activity    Assist    Wheelchair 50 feet with 2 turns activity did not occur: Safety/medical concerns       Wheelchair 150 feet activity      Assist  Wheelchair 150 feet activity did not occur: Safety/medical concerns       Blood pressure (!) 161/64, pulse (!) 59, temperature 98.1 F (36.7 C), resp. rate 19, height 5' (1.524 m), weight 67.5 kg, SpO2 98 %.  Medical Problem List and Plan: 1.  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  Status post loop recorder placement  Cont CIR 2.  Antithrombotics:  -DVT/anticoagulation: Lovenox.             -antiplatelet therapy: Aspirin 325 mg daily, Plavix 75 mg daily- 3. Pain Management: Tylenol as needed 4. Mood: Aricept 5 mg daily, Seroquel 25 mg nightly             -antipsychotic agents: seroquel QHS for sundowning, increased to 50mg  on 11/13 5. Neuropsych: This patient is not capable of making decisions on her own behalf. 6. Skin/Wound Care: Routine skin checks 7. Fluids/Electrolytes/Nutrition: Routine in and outs 8.  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  Monitor for any signs of fluid overload.  Lasix 40 mg twice daily Filed Weights   09/25/19 0604 09/28/19 0514 09/29/19 0503  Weight: 70 kg 68 kg 67.5 kg   Stable on 11/15 10.  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 ?AKI versus CKD stage III.    Creatinine 1.32 on 11/13, labs ordered for tomorrow  Encourage fluids, however may be difficult to maintain given nectar liquids  Will consider IVF if necessary 11.  Hypertension.  Lopressor 50 mg twice daily.  Monitor with increased mobility Vitals:   09/28/19 1938 09/29/19 0503  BP: 140/68 (!) 161/64  Pulse: 77 (!) 59  Resp: 19 19  Temp: 98.3 F (36.8 C) 98.1 F (36.7 C)  SpO2: 96% 98%   Labile on 11/15 12.  Hypothyroidism.  Synthroid 13.  Hyperlipidemia.  Lipitor 14.  Obesity: BMI 29.7 15. Post stroke seizure:  complex partial   Continue Keppra 500mg  BID as per Neuro, neuro has signed off but they will need to see her as an outpatient  No reported seizures over the weekend 16.  OSA CPAP with oxygen at home. 17. Bradycardia:  Labile on 11/15  Orthostatics negative  18.  Acute blood loss anemia  Hemoglobin 11.2 on 11/6, labs ordered for tomorrow  LOS:12 days A FACE TO FACE EVALUATION WAS PERFORMED  Owyn Raulston Lorie Phenix 09/29/2019, 11:26 AM

## 2019-09-29 NOTE — Progress Notes (Signed)
Speech Language Pathology Daily Session Note  Patient Details  Name: April Le MRN: [id number] Date of Birth: 09-14-1941  Today's Date: 09/29/2019 SLP Individual Time: 1005-1050 SLP Individual Time Calculation (min): 45 min  Short Term Goals: Week 2: SLP Short Term Goal 1 (Week 2): Pt will consume dysphagia 2 diet with nectar thick liquids with supervision level cues for use of compensatory swallow strategies. SLP Short Term Goal 2 (Week 2): Pt will consume trials of dysphagia 3 over 3 sessions with minimal overt s/s of aspiration and Min A cues for use of compensatory swallow strategies. SLP Short Term Goal 3 (Week 2): Pt will consume trials of thin liquids with minimal overt s/s of aspiration over 5 sessions to demonstrate readiness for repeat instrumental swallow study. SLP Short Term Goal 4 (Week 2): Pt will demonstrate sustained attention to task for 5 minutes with Min A cues. SLP Short Term Goal 5 (Week 2): Pt will utilize multi-modal communication to express basic wants/needs in 5 out of 10 opportunities and Mod A cues. SLP Short Term Goal 6 (Week 2): Pt will utilize word finding strategies to produce phrase length utterances that are ~ 25% intelligibility with Max A cues.  Skilled Therapeutic Interventions:  Pt was seen for skilled ST targeting goals for dysphagia and communication.  Therapist facilitated the session with trials of thin liquids via teaspoon to continue working towards diet progression and initiation of the water protocol.  Pt completed oral care with max faded to mod assist multimodal cues for task sequencing.  She then consumed ~10 teaspoons of water with no overt s/s of aspiration.  Pt appears to be an appropriate candidate for the water protocol at this time.  Discussed recommendations with pt's daughter who was present and posted signage in pt's room.  Pt was able to name basic, familiar objects for ~25% accuracy with a combination of melodic intonation therapy and  carrier phrases.  Her productions were most typically approximations of real words and often missing initial phonemes despite max assist multimodal cues for shaping of responses.  Pt was left in bed with bed alarm set and daughter at bedside.  Continue per current plan of care.       Pain Pain Assessment Pain Scale: 0-10 Pain Score: 0-No pain  Therapy/Group: Individual Therapy  Melik Blancett, Selinda Orion 09/29/2019, 12:47 PM

## 2019-09-29 NOTE — Progress Notes (Signed)
Occupational Therapy Session Note  Patient Details  Name: April Le MRN: [id number] Date of Birth: 30-Oct-1941  Today's Date: 09/29/2019 OT Individual Time: 1300-1358 OT Individual Time Calculation (min): 58 min    Short Term Goals: Week 2:  OT Short Term Goal 1 (Week 2): Pt will perform bathing tasks with mod A overall. OT Short Term Goal 2 (Week 2): Pt will perform LB dressing with mod A overall. OT Short Term Goal 3 (Week 2): Pt will perform grooming tasks at sink with min A overall. OT Short Term Goal 4 (Week 2): Pt will perform toileting with min A overall.  Skilled Therapeutic Interventions/Progress Updates:    Pt greeted in w/c with no s/s pain, 02 sats 95% on RA. Strongly encouraged her to try toileting however pt verbalized "no" and shook her head multiple times. Per dtr April Le, pt had asked RN staff if she could use the [facility] this AM and had a continent void. Pt also did not want to complete bathing/dressing tasks today, agreeable to a few sinkside grooming tasks. Tx focus placed on praxis. Started with face washing while seated. Pt needed Min A to lean forward to manage faucet lever. Assist for sequencing also provided as pt would just place a dry wash cloth on her face without wetting it. She tried to orally consume the soap when standing to wash her hands. HOH for reestablishing correct motor plan, able to continue washing hands when April Le was stopped. 02 sats afterwards dipped to 88% but rebounded to 94% on RA in less than 10 seconds. She then sat to apply lotion to UEs, initiating reaching for bottle. Continued working on motor planning and bilateral hand use via task-specific training. Had her don pillowcases and fold wash cloths + towels. She was able to don the 1st pillowcase with increased time but required A to orient pillowcases afterwards. Unable to retain problem solving strategies when folding linen also. She did follow 1 step instruction consistently and corrected simple  mistakes with cuing. We also reviewed a few pictures of family members. Pt unable to verbalize name of self, spouse, or dtr. Pt left in w/c with all needs and dtr present. 02 sats 95% on RA.      Therapy Documentation Precautions:  Precautions Precautions: Fall Precaution Comments: cortrak Restrictions Weight Bearing Restrictions: No Vital Signs: Therapy Vitals Temp: 98.6 F (37 C) Temp Source: Oral Pulse Rate: (!) 59 Resp: 20 BP: (!) 105/58 Patient Position (if appropriate): Sitting Oxygen Therapy SpO2: 97 % O2 Device: Room Air Pain: Pain Assessment Pain Scale: 0-10 Pain Score: 0-No pain ADL:       Therapy/Group: Individual Therapy  April Le 09/29/2019, 4:30 PM

## 2019-09-30 ENCOUNTER — Inpatient Hospital Stay (HOSPITAL_COMMUNITY): Payer: Medicare Other | Admitting: Physical Therapy

## 2019-09-30 ENCOUNTER — Inpatient Hospital Stay (HOSPITAL_COMMUNITY): Payer: Medicare Other | Admitting: Occupational Therapy

## 2019-09-30 ENCOUNTER — Inpatient Hospital Stay (HOSPITAL_COMMUNITY): Payer: Medicare Other

## 2019-09-30 ENCOUNTER — Inpatient Hospital Stay: Payer: Self-pay

## 2019-09-30 LAB — CBC WITH DIFFERENTIAL/PLATELET
Abs Immature Granulocytes: 0.05 10*3/uL (ref 0.00–0.07)
Basophils Absolute: 0.1 10*3/uL (ref 0.0–0.1)
Basophils Relative: 2 %
Eosinophils Absolute: 0.5 10*3/uL (ref 0.0–0.5)
Eosinophils Relative: 7 %
HCT: 33.2 % — ABNORMAL LOW (ref 36.0–46.0)
Hemoglobin: 10.6 g/dL — ABNORMAL LOW (ref 12.0–15.0)
Immature Granulocytes: 1 %
Lymphocytes Relative: 18 %
Lymphs Abs: 1.2 10*3/uL (ref 0.7–4.0)
MCH: 31 pg (ref 26.0–34.0)
MCHC: 31.9 g/dL (ref 30.0–36.0)
MCV: 97.1 fL (ref 80.0–100.0)
Monocytes Absolute: 0.7 10*3/uL (ref 0.1–1.0)
Monocytes Relative: 10 %
Neutro Abs: 4.3 10*3/uL (ref 1.7–7.7)
Neutrophils Relative %: 62 %
Platelets: 338 10*3/uL (ref 150–400)
RBC: 3.42 MIL/uL — ABNORMAL LOW (ref 3.87–5.11)
RDW: 14 % (ref 11.5–15.5)
WBC: 6.8 10*3/uL (ref 4.0–10.5)
nRBC: 0 % (ref 0.0–0.2)

## 2019-09-30 LAB — BASIC METABOLIC PANEL
Anion gap: 11 (ref 5–15)
BUN: 22 mg/dL (ref 8–23)
CO2: 26 mmol/L (ref 22–32)
Calcium: 8.9 mg/dL (ref 8.9–10.3)
Chloride: 106 mmol/L (ref 98–111)
Creatinine, Ser: 1.1 mg/dL — ABNORMAL HIGH (ref 0.44–1.00)
GFR calc Af Amer: 55 mL/min — ABNORMAL LOW (ref >60)
GFR calc non Af Amer: 48 mL/min — ABNORMAL LOW (ref >60)
Glucose, Bld: 139 mg/dL — ABNORMAL HIGH (ref 70–99)
Potassium: 3.6 mmol/L (ref 3.5–5.1)
Sodium: 143 mmol/L (ref 135–145)

## 2019-09-30 MED ORDER — LEVETIRACETAM 100 MG/ML PO SOLN
500.0000 mg | Freq: Two times a day (BID) | ORAL | Status: DC
  Administered 2019-09-30: 500 mg
  Filled 2019-09-30 (×2): qty 5

## 2019-09-30 MED ORDER — LEVOTHYROXINE SODIUM 50 MCG PO TABS
50.0000 ug | ORAL_TABLET | Freq: Every day | ORAL | Status: DC
  Administered 2019-10-01 – 2019-10-02 (×2): 50 ug
  Filled 2019-09-30 (×2): qty 1

## 2019-09-30 NOTE — Progress Notes (Signed)
Occupational Therapy Session Note  Patient Details  Name: April Le MRN: [id number] Date of Birth: 06/26/1941  Today's Date: 09/30/2019 OT Individual Time: 1100-1202 OT Individual Time Calculation (min): 62 min    Short Term Goals: Week 2:  OT Short Term Goal 1 (Week 2): Pt will perform bathing tasks with mod A overall. OT Short Term Goal 2 (Week 2): Pt will perform LB dressing with mod A overall. OT Short Term Goal 3 (Week 2): Pt will perform grooming tasks at sink with min A overall. OT Short Term Goal 4 (Week 2): Pt will perform toileting with min A overall.  Skilled Therapeutic Interventions/Progress Updates:    Patient, awake/alert, sitting in bed.  Aphasia limits noted both expressive and receptive requiring gestures and visual choices to determine wants/needs.  She is pleasant & cooperative.  Supine to SSP with min A. Sit to stand and SPT with RW to/from bed, w/c, mat table with CG/min A - cues for turning, walker placement and safety.  Grooming tasks at sink with min A.  UB dressing min A for OH shirt   LB dressing max A to manage soiled brief, hygiene and donning pants. Footwear/TEDS dependent.  She tolerated unsupported sitting with right hand dexterity activity with CS - tactile cues to place objects with right hand (she is able to pick up checker size pieces and manipulate them but cannot follow directions with repetition for each piece to place them correctly)  Returned to room, remained seated in w/c with seat belt alarm set, call bell and tray table in reach.    Therapy Documentation Precautions:  Precautions Precautions: Fall Precaution Comments: cortrak Restrictions Weight Bearing Restrictions: No General:   Vital Signs:  Pain: Pain Assessment Pain Scale: 0-10 Pain Score: 0-No pain Other Treatments:     Therapy/Group: Individual Therapy  Carlos Levering 09/30/2019, 12:43 PM

## 2019-09-30 NOTE — Progress Notes (Signed)
Speech Language Pathology Daily Session Note  Patient Details  Name: April Le MRN: [id number] Date of Birth: Mar 23, 1941  Today's Date: 09/30/2019 SLP Individual Time: 1000-1100 SLP Individual Time Calculation (min): 60 min  Short Term Goals: Week 2: SLP Short Term Goal 1 (Week 2): Pt will consume dysphagia 2 diet with nectar thick liquids with supervision level cues for use of compensatory swallow strategies. SLP Short Term Goal 2 (Week 2): Pt will consume trials of dysphagia 3 over 3 sessions with minimal overt s/s of aspiration and Min A cues for use of compensatory swallow strategies. SLP Short Term Goal 3 (Week 2): Pt will consume trials of thin liquids with minimal overt s/s of aspiration over 5 sessions to demonstrate readiness for repeat instrumental swallow study. SLP Short Term Goal 4 (Week 2): Pt will demonstrate sustained attention to task for 5 minutes with Min A cues. SLP Short Term Goal 5 (Week 2): Pt will utilize multi-modal communication to express basic wants/needs in 5 out of 10 opportunities and Mod A cues. SLP Short Term Goal 6 (Week 2): Pt will utilize word finding strategies to produce phrase length utterances that are ~ 25% intelligibility with Max A cues.  Skilled Therapeutic Interventions: Skilled ST services focused on swallow and language skills. Pt demonstrated ability to follow basic commands during bed mobility with max A verbal/visual cues. Pt demonstrated problem solving skills during oral care with suction toothbrush, requiring initially mod A cues fade to min A verbal/visual cues, although SLP did have to finish task to ensure thoroughness in cleaning oral cavity.SLP facilitated PO consumption of thin via TSP, pt consumed x10 trials with no overt s/s aspiration and x5 trials of controled cup sips with x 1 delayed cough noted. SLP also facilitated naming of familiar objects, pt demonstrated ability to name 1 out 5 objects with carrier phrase and repeat 4 out 5  objects with max A verbal cues and in which pt continued to demonstrate initial constant deletion with no awareness. SLP attempted task targeting sustained attention and auditory comprehension (sorting cards by two colors and sorting shapes by 2 colors), however pt required total- Max A for auditory comprehension. Pt was left in room with call bell within reach and bed alarm set. ST recommends to continue skilled ST services.      Pain Pain Assessment Pain Scale: 0-10 Pain Score: 0-No pain  Therapy/Group: Individual Therapy  April Le  [facility] 09/30/2019, 12:44 PM

## 2019-09-30 NOTE — Progress Notes (Signed)
Physical Therapy Session Note  Patient Details  Name: April Le MRN: [id number] Date of Birth: 03-03-1941  Today's Date: 09/30/2019 PT Individual Time: [id number] PT Individual Time Calculation (min): 71 min   Short Term Goals: Week 2:  PT Short Term Goal 1 (Week 2): Pt will ambulate >37ft with min assist and LRAD PT Short Term Goal 2 (Week 2): Pt will attend to task for >5 minutes wiht mod cues from PT PT Short Term Goal 3 (Week 2): PT's family will initiate education to prepare for d/c home with min assist  Skilled Therapeutic Interventions/Progress Updates: Pt presented in w/c with husband present requiring extensive encouragement to participate in therapy ultimately requiring husband to be present during therapy. Pt transported to rehab gym and performed stand pivot transfer to mat with minA and verbal cues for sequencing. Pt participated in x 2 bouts of horseshoes with and without RW. Without AD required frequent rest breaks due to pt impulsively sitting at mat and then requiring increased time to stand. Pt performed toe taps to 4 in step with tactile cues for improvement of erect posture and mod verbal cues for sequencing. Performed STS without AD x 3 with pt requiring increased time due to increased time to follow commands. Participated in toe taps with RW x 10 bilaterally with increased time to follow command and motor plan. Pt with increased garbled speech each time PTA asked pt to perform activity, however when PTA asked pt if tired pt stating "NO". Pt performed standing SLR and standing march between 5-7 reps each with pt impulsively sitting between bouts. Pt nodding head when PTA asked of wanted to do something else. Pt ambulated x 30 ft with modA, shuffling gait, and max tactile cues for safety with RW. Pt then transferred to w/c via stand pivot and modA with max cues for use of RW. Pt transported to day room and participated in Cybex Kinetron 80cm/sec x 4 min for reciprocal activity and  general conditioning. Pt attempted to stand once placed in front of Kinetron with PTA requiring max manual facilitation of encourage pt to sit back in w/c. Pt returned to room at end of session and performed squat pivot transfer to bed modA. Pt was able to transfer to supine CGA with increased time and with max verbal cues performed lateral scoots to middle of bed. Pt noted to be incontinent in bladder once in bed Pt left with bed alarm on, call bell within reach, and NT Makayla notified of pt's disposition.      Therapy Documentation Precautions:  Precautions Precautions: Fall Precaution Comments: cortrak Restrictions Weight Bearing Restrictions: No General:   Vital Signs: Therapy Vitals Temp: 98.2 F (36.8 C) Temp Source: Oral Pulse Rate: 66 Resp: 16 BP: (!) 114/57 Patient Position (if appropriate): Sitting Oxygen Therapy SpO2: 99 % O2 Device: Room Air Pain: Pain Assessment Pain Scale: 0-10 Pain Score: 0-No pain    Therapy/Group: Individual Therapy  Grady Mohabir  Luisalberto Beegle, PTA  09/30/2019, 4:36 PM

## 2019-09-30 NOTE — Progress Notes (Signed)
[place] PHYSICAL MEDICINE & REHABILITATION PROGRESS NOTE   Subjective/Complaints:  Remains severely apraxic and aphasic, working with SLP, difficulty with simple sorting task.  Does better with contexual and gestural cues   ROS- limited by cognition.   Objective:   No results found. Recent Labs    09/30/19 0501  WBC 6.8  HGB 10.6*  HCT 33.2*  PLT 338   Recent Labs    09/27/19 1814 09/30/19 0501  NA 142 143  K 3.6 3.6  CL 98 106  CO2 26 26  GLUCOSE 121* 139*  BUN 35* 22  CREATININE 1.30* 1.10*  CALCIUM 9.4 8.9    Intake/Output Summary (Last 24 hours) at 09/30/2019 1015 Last data filed at 09/30/2019 0845 Gross per 24 hour  Intake 435 ml  Output -  Net 435 ml     Physical Exam: Vital Signs Blood pressure (!) 147/104, pulse 73, temperature 97.7 F (36.5 C), temperature source Oral, resp. rate 16, height 5' (1.524 m), weight 67.5 kg, SpO2 100 %. Constitutional: No distress . Vital signs reviewed. HENT: Normocephalic.  Atraumatic. Eyes: EOMI. No discharge. Cardiovascular: No JVD. Respiratory: Normal effort.  No stridor. GI: Non-distended. Skin: Warm and dry.  Intact. Psych: Normal mood.  Normal behavior. Musc:no pain with UE LE ROM, no evidence of edema  Neurologic: Alert., severe aphasia, cannot state name, can close or open eyes to command but unable to follow other commands involving movement of limbs  Global aphasia Motor: Limited due to participation, but appears to be moving all extremities spontaneously, unchanged  Assessment/Plan: 1. 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  Physiatrist is providing close team supervision and 24 hour management of active medical problems listed below.  Physiatrist and rehab team continue to assess barriers to discharge/monitor patient progress toward functional and medical goals  Care  Tool:  Bathing  Bathing activity did not occur: Refused Body parts bathed by patient: Face, Front perineal area   Body parts bathed by helper: Right arm, Left arm, Chest, Abdomen, Buttocks, Right upper leg, Left upper leg, Right lower leg, Left lower leg     Bathing assist Assist Level: 2 Helpers     Upper Body Dressing/Undressing Upper body dressing Upper body dressing/undressing activity did not occur (including orthotics): Refused What is the patient wearing?: Pull over shirt    Upper body assist Assist Level: Moderate Assistance - Patient 50 - 74%    Lower Body Dressing/Undressing Lower body dressing    Lower body dressing activity did not occur: Refused What is the patient wearing?: Pants, Incontinence brief     Lower body assist Assist for lower body dressing: Maximal Assistance - Patient 25 - 49%     Toileting Toileting Toileting Activity did not occur Landscape architect and hygiene only): Refused  Toileting assist Assist for toileting: 2 Helpers     Transfers Chair/bed transfer  Transfers assist  Chair/bed transfer activity did not occur: Safety/medical concerns  Chair/bed transfer assist level: Moderate Assistance - Patient 50 - 74%     Locomotion Ambulation   Ambulation assist   Ambulation activity did not occur: Safety/medical concerns  Assist level: Moderate Assistance - Patient 50 - 74% Assistive device: Walker-rolling Max distance: 83ft   Walk 10 feet activity   Assist  Walk 10 feet activity did not occur: Safety/medical concerns  Assist level: Moderate Assistance - Patient - 50 - 74% Assistive device: Walker-rolling  Walk 50 feet activity   Assist Walk 50 feet with 2 turns activity did not occur: Safety/medical concerns         Walk 150 feet activity   Assist Walk 150 feet activity did not occur: Safety/medical concerns         Walk 10 feet on uneven surface  activity   Assist Walk 10 feet on uneven surfaces  activity did not occur: Safety/medical concerns         Wheelchair     Assist Will patient use wheelchair at discharge?: Yes   Wheelchair activity did not occur: Safety/medical concerns         Wheelchair 50 feet with 2 turns activity    Assist    Wheelchair 50 feet with 2 turns activity did not occur: Safety/medical concerns       Wheelchair 150 feet activity     Assist  Wheelchair 150 feet activity did not occur: Safety/medical concerns       Blood pressure (!) 147/104, pulse 73, temperature 97.7 F (36.5 C), temperature source Oral, resp. rate 16, height 5' (1.524 m), weight 67.5 kg, SpO2 100 %.  Medical Problem List and Plan: 1.  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  Status post loop recorder placement  Cont CIR PT, OT,  SLP 2.  Antithrombotics:  -DVT/anticoagulation: Lovenox.             -antiplatelet therapy: Aspirin 325 mg daily, Plavix 75 mg daily- 3. Pain Management: Tylenol as needed 4. Mood: Aricept 5 mg daily, Seroquel 25 mg nightly             -antipsychotic agents: seroquel QHS for sundowning, increased to 50mg  on 11/13 5. Neuropsych: This patient is not capable of making decisions on her own behalf. 6. Skin/Wound Care: Routine skin checks 7. Fluids/Electrolytes/Nutrition: Routine in and outs 8.  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  Monitor for any signs of fluid overload.  Lasix 40 mg twice daily Filed Weights   09/25/19 0604 09/28/19 0514 09/29/19 0503  Weight: 70 kg 68 kg 67.5 kg   Stable on 11/16 10.  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 ?AKI versus CKD stage III.    Creatinine 1.32 on 11/13, Creat 1.1 today improved  Encourage fluids, however may be difficult to maintain  given nectar liquids Cont  IVF  11.  Hypertension.  Lopressor 50 mg twice daily.  Monitor with increased mobility Vitals:   09/30/19 0500 09/30/19 0503  BP: (!) 152/76 (!) 147/104  Pulse: 63 73  Resp:    Temp:    SpO2: 98% 100%   Labile on [id number], mainly diastolic elevation, monitor , will monitor for now consider addition of amlodipine  12.  Hypothyroidism.  Synthroid 13.  Hyperlipidemia.  Lipitor 14.  Obesity: BMI 29.7 15. Post stroke seizure: complex partial   Continue Keppra 500mg  BID as per Neuro, neuro has signed off but they will need to see her as an outpatient  No reported seizures over the weekend 16.  OSA CPAP with oxygen at home. 17. Bradycardia:  Due to BB- stable  18.  Acute blood loss anemia  Hemoglobin 11.2 on 11/6, labs ordered for tomorrow  LOS:13 days A FACE TO FACE EVALUATION WAS PERFORMED  Charlett Blake 09/30/2019,  10:15 AM

## 2019-10-01 ENCOUNTER — Inpatient Hospital Stay (HOSPITAL_COMMUNITY): Payer: Medicare Other | Admitting: Occupational Therapy

## 2019-10-01 ENCOUNTER — Inpatient Hospital Stay (HOSPITAL_COMMUNITY): Payer: Medicare Other | Admitting: Physical Therapy

## 2019-10-01 ENCOUNTER — Inpatient Hospital Stay (HOSPITAL_COMMUNITY): Payer: Medicare Other

## 2019-10-01 LAB — CREATININE, SERUM
Creatinine, Ser: 1.01 mg/dL — ABNORMAL HIGH (ref 0.44–1.00)
GFR calc Af Amer: >60 mL/min (ref >60)
GFR calc non Af Amer: 53 mL/min — ABNORMAL LOW (ref >60)

## 2019-10-01 MED ORDER — SODIUM CHLORIDE 0.9% FLUSH
10.0000 mL | Freq: Two times a day (BID) | INTRAVENOUS | Status: DC
  Administered 2019-10-01: 10 mL
  Administered 2019-10-01: 15 mL

## 2019-10-01 MED ORDER — LEVETIRACETAM 500 MG PO TABS
500.0000 mg | ORAL_TABLET | Freq: Two times a day (BID) | ORAL | Status: DC
  Administered 2019-10-01 – 2019-10-02 (×3): 500 mg via ORAL
  Filled 2019-10-01 (×4): qty 1

## 2019-10-01 MED ORDER — SODIUM CHLORIDE 0.9% FLUSH: 10.0000 mL | INTRAVENOUS | Status: DC | PRN

## 2019-10-01 NOTE — Progress Notes (Signed)
[place] PHYSICAL MEDICINE & REHABILITATION PROGRESS NOTE   Subjective/Complaints:  Pt awake, nods her head yes to [place] and no to "Joe" ROS- limited by cognition.   Objective:   [place] Ekg Site Rite  Result Date: 09/30/2019 If Site Rite image not attached, placement could not be confirmed due to current cardiac rhythm.  Recent Labs    09/30/19 0501  WBC 6.8  HGB 10.6*  HCT 33.2*  PLT 338   Recent Labs    09/30/19 0501 10/01/19 0506  NA 143  --   K 3.6  --   CL 106  --   CO2 26  --   GLUCOSE 139*  --   BUN 22  --   CREATININE 1.10* 1.01*  CALCIUM 8.9  --     Intake/Output Summary (Last 24 hours) at 10/01/2019 0718 Last data filed at 09/30/2019 1920 Gross per 24 hour  Intake 255 ml  Output -  Net 255 ml     Physical Exam: Vital Signs Blood pressure 100/66, pulse 68, temperature 98.5 F (36.9 C), resp. rate 18, height 5' (1.524 m), weight 67.5 kg, SpO2 98 %. Constitutional: No distress . Vital signs reviewed. HENT: Normocephalic.  Atraumatic. Eyes: EOMI. No discharge. Cardiovascular: No JVD. Respiratory: Normal effort.  No stridor. GI: Non-distended. Skin: Warm and dry.  Intact. Psych: Normal mood.  Normal behavior. Musc:no pain with UE LE ROM, no evidence of edema  Neurologic: Alert., severe aphasia, cannot state name, can close or open eyes to command but unable to follow other commands involving movement of limbs  Global aphasia exp>recept Motor: Limited due to participation, but appears to be moving all extremities spontaneously, unchanged  Assessment/Plan: 1. 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  Physiatrist is providing close team supervision and 24 hour management of active medical problems listed below.  Physiatrist and rehab team continue to assess barriers to discharge/monitor patient progress toward functional and medical  goals  Care Tool:  Bathing  Bathing activity did not occur: Refused Body parts bathed by patient: Face, Front perineal area   Body parts bathed by helper: Right arm, Left arm, Chest, Abdomen, Buttocks, Right upper leg, Left upper leg, Right lower leg, Left lower leg     Bathing assist Assist Level: 2 Helpers     Upper Body Dressing/Undressing Upper body dressing Upper body dressing/undressing activity did not occur (including orthotics): Refused What is the patient wearing?: Pull over shirt    Upper body assist Assist Level: Minimal Assistance - Patient > 75%    Lower Body Dressing/Undressing Lower body dressing    Lower body dressing activity did not occur: Refused What is the patient wearing?: Pants, Incontinence brief     Lower body assist Assist for lower body dressing: Moderate Assistance - Patient 50 - 74%     Toileting Toileting Toileting Activity did not occur Landscape architect and hygiene only): Refused  Toileting assist Assist for toileting: Maximal Assistance - Patient 25 - 49%     Transfers Chair/bed transfer  Transfers assist  Chair/bed transfer activity did not occur: Safety/medical concerns  Chair/bed transfer assist level: Minimal Assistance - Patient > 75%     Locomotion Ambulation   Ambulation assist   Ambulation activity did not occur: Safety/medical concerns  Assist level: Moderate Assistance - Patient 50 - 74% Assistive device: Walker-rolling Max distance: 74ft   Walk 10 feet activity  Assist  Walk 10 feet activity did not occur: Safety/medical concerns  Assist level: Moderate Assistance - Patient - 50 - 74% Assistive device: Walker-rolling   Walk 50 feet activity   Assist Walk 50 feet with 2 turns activity did not occur: Safety/medical concerns         Walk 150 feet activity   Assist Walk 150 feet activity did not occur: Safety/medical concerns         Walk 10 feet on uneven surface  activity   Assist  Walk 10 feet on uneven surfaces activity did not occur: Safety/medical concerns         Wheelchair     Assist Will patient use wheelchair at discharge?: Yes   Wheelchair activity did not occur: Safety/medical concerns         Wheelchair 50 feet with 2 turns activity    Assist    Wheelchair 50 feet with 2 turns activity did not occur: Safety/medical concerns       Wheelchair 150 feet activity     Assist  Wheelchair 150 feet activity did not occur: Safety/medical concerns       Blood pressure 100/66, pulse 68, temperature 98.5 F (36.9 C), resp. rate 18, height 5' (1.524 m), weight 67.5 kg, SpO2 98 %.  Medical Problem List and Plan: 1.  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  Status post loop recorder placement  Cont CIR PT, OT,  SLP 2.  Antithrombotics:  -DVT/anticoagulation: Lovenox.             -antiplatelet therapy: Aspirin 325 mg daily, Plavix 75 mg daily- 3. Pain Management: Tylenol as needed 4. Mood: Aricept 5 mg daily, Seroquel 25 mg nightly             -antipsychotic agents: seroquel QHS for sundowning, increased to 50mg  on 11/13 5. Neuropsych: This patient is not capable of making decisions on her own behalf. 6. Skin/Wound Care: Routine skin checks 7. Fluids/Electrolytes/Nutrition: Routine in and outs 8.  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  Monitor for any signs of fluid overload.  Lasix 40 mg twice daily Filed Weights   09/25/19 0604 09/28/19 0514 09/29/19 0503  Weight: 70 kg 68 kg 67.5 kg   Stable on 11/17 10.  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 ?AKI versus CKD stage III.    Creatinine 1.32 on 11/13, Creat 1.01 today improved  Encourage fluids, however may be difficult to maintain  given nectar liquids Cont  IVF  11.  Hypertension.  Lopressor 50 mg twice daily.  Monitor with increased mobility Vitals:   10/01/19 0429 10/01/19 0446  BP: (!) 165/82 100/66  Pulse: 72 68  Resp: 17 18  Temp: 98.4 F (36.9 C) 98.5 F (36.9 C)  SpO2: 96% 98%   Labile on 11/17 no med changes , check ortho BPs 12.  Hypothyroidism.  Synthroid 13.  Hyperlipidemia.  Lipitor 14.  Obesity: BMI 29.7 15. Post stroke seizure: complex partial   Continue Keppra 500mg  BID po as per Neuro, neuro has signed off but they will need to see her as an outpatient  No reported seizures  16.  OSA CPAP with oxygen at home. 17. Bradycardia:  Due to BB- stable  18.  Acute blood loss anemia  Hemoglobin 11.2 on  11/6, labs ordered for tomorrow  LOS:14 days A FACE TO FACE EVALUATION WAS PERFORMED  Charlett Blake 10/01/2019, 7:18 AM

## 2019-10-01 NOTE — Progress Notes (Signed)
Occupational Therapy Session Note  Patient Details  Name: April Le MRN: [id number] Date of Birth: Apr 06, 1941  Today's Date: 10/01/2019 OT Individual Time: 1100-1130 OT Individual Time Calculation (min): 30 min    Short Term Goals: Week 1:  OT Short Term Goal 1 (Week 1): Pt will perform mod A for toileting. OT Short Term Goal 1 - Progress (Week 1): Met OT Short Term Goal 2 (Week 1): Pt will perform Mod A for bathing tasks at sink. OT Short Term Goal 2 - Progress (Week 1): Not met OT Short Term Goal 3 (Week 1): Pt will perform UB dressing with min A overall. OT Short Term Goal 3 - Progress (Week 1): Not met  Skilled Therapeutic Interventions/Progress Updates:    1:1 Pt in w/c when arrived. Declined toileting needs. Pt taken to the gym and performed stand step pivot transfers with min A with extra time without AD. Performed task in standing with focus on dynamic balance, trunk rotation to reach down to obtain object from mat and then perform task back on raised table top. Pt rotated both to the right and the left. On the right obtained foam lego block and then stacked them with left hand then alternated to right hand reaching back for peg to put in peg board. Pt with difficulty alternating requiring cues each time to alternate and to locate box on mat. Min A to follow one step tasks. Demonstrate decr carry over with tasks but able to complete them each time with cues. Pt able to maintain standing balance with ocntact guard. Min A stand pivot transfer back into w/c and then back into bed. PT able to get into bed with contact guard with extra time. Left resting in bed.   Therapy Documentation Precautions:  Precautions Precautions: Fall Precaution Comments: cortrak Restrictions Weight Bearing Restrictions: No Pain: Pain Assessment Pain Score: 0-No pain   Therapy/Group: Individual Therapy  Willeen Cass [facility] 10/01/2019, 11:57 AM

## 2019-10-01 NOTE — Plan of Care (Signed)
  Problem: Consults Goal: RH STROKE PATIENT EDUCATION Description: See Patient Education module for education specifics  Outcome: Not Progressing   Problem: RH BOWEL ELIMINATION Goal: RH STG MANAGE BOWEL WITH ASSISTANCE Description: STG Manage Bowel with [place]. Outcome: Not Progressing Goal: RH STG MANAGE BOWEL W/MEDICATION W/ASSISTANCE Description: STG Manage Bowel with Medication with min Assistance. Outcome: Not Progressing   Problem: RH BLADDER ELIMINATION Goal: RH STG MANAGE BLADDER WITH ASSISTANCE Description: STG Manage Bladder With min Assistance Outcome: Not Progressing   Problem: RH SAFETY Goal: RH STG ADHERE TO SAFETY PRECAUTIONS W/ASSISTANCE/DEVICE Description: STG Adhere to Safety Precautions With min Assistance/Device. Outcome: Not Progressing   Problem: RH COGNITION-NURSING Goal: RH STG ANTICIPATES NEEDS/CALLS FOR ASSIST W/ASSIST/CUES Description: STG Anticipates Needs/Calls for Assist With min Assistance/Cues. Outcome: Not Progressing   Problem: RH PAIN MANAGEMENT Goal: RH STG PAIN MANAGED AT OR BELOW PT'S PAIN GOAL Description: < 3 out of 10.  Outcome: Not Progressing

## 2019-10-01 NOTE — Progress Notes (Signed)
Patient's renal function has rebounded nicely after IV fluids.  Will discontinue for now and monitor for any signs of fluid overload with history of diastolic congestive heart failure.  Continue diuresis for now and follow-up chemistries in a.m.

## 2019-10-01 NOTE — Progress Notes (Signed)
Speech Language Pathology Daily Session Note  Patient Details  Name: April Le MRN: [id number] Date of Birth: 23-Jan-1941  Today's Date: 10/01/2019 SLP Individual Time: 0802-0859 SLP Individual Time Calculation (min): 57 min  Short Term Goals: Week 2: SLP Short Term Goal 1 (Week 2): Pt will consume dysphagia 2 diet with nectar thick liquids with supervision level cues for use of compensatory swallow strategies. SLP Short Term Goal 2 (Week 2): Pt will consume trials of dysphagia 3 over 3 sessions with minimal overt s/s of aspiration and Min A cues for use of compensatory swallow strategies. SLP Short Term Goal 3 (Week 2): Pt will consume trials of thin liquids with minimal overt s/s of aspiration over 5 sessions to demonstrate readiness for repeat instrumental swallow study. SLP Short Term Goal 4 (Week 2): Pt will demonstrate sustained attention to task for 5 minutes with Min A cues. SLP Short Term Goal 5 (Week 2): Pt will utilize multi-modal communication to express basic wants/needs in 5 out of 10 opportunities and Mod A cues. SLP Short Term Goal 6 (Week 2): Pt will utilize word finding strategies to produce phrase length utterances that are ~ 25% intelligibility with Max A cues.  Skilled Therapeutic Interventions: Skilled ST services focused on swallow and language skills. SLP facilitated PO consumption trial of limited dys 3 snack, pt demonstrated appropriate oral phrase, mastication with slight bolus inattention and mild oral residue that was cleared with liquid wash/presented NTL via cup.  Pt demonstrated ability to follow commands during oral care task requiring min A verbal/visual cues (SLP completed task for thoroughness) and wash face with wash cloth with mod A verba;/visual cues. Pt's brief was wet, however pt demonstrated no awareness, but agreeable to change brief. Pt required max A rolling to left and mod A rolling to right during bed mobility. Pt was able to name 1 out 4 common  objects with carrier phrase and identified 0/3 objects in a field of 2 with max A verbal/visual cues. Pt demonstrated ability to cont 1-5 in 1 out 10 opportunities with 100% accuracy and remainder of trials average 60% accuracy when completed in unison with SLP. Pt was able to respond " I am fine" when attempting to repeat " how are you" utilizing MIT. SLP facilitated PO consumption trials of thin via tsp x10 and small cup sips x10, no overt s/s aspirations notes and appropriate oral manipulation. Pt was overall lethargic during treatment session, closing eyes when not directly spoken to.  Pt was left in room with call bell within reach and bed alarm set. ST recommends to continue skilled ST services.      Pain Pain Assessment Pain Score: 0-No pain  Therapy/Group: Individual Therapy  Fenix Rorke  [facility] 10/01/2019, 1:11 PM

## 2019-10-01 NOTE — Progress Notes (Signed)
Physical Therapy Session Note  Patient Details  Name: April Le MRN: [id number] Date of Birth: 11-20-1940  Today's Date: 10/01/2019 PT Individual Time: 0907-1002 PT Individual Time Calculation (min): 55 min   Short Term Goals: Week 2:  PT Short Term Goal 1 (Week 2): Pt will ambulate >52ft with min assist and LRAD PT Short Term Goal 2 (Week 2): Pt will attend to task for >5 minutes wiht mod cues from PT PT Short Term Goal 3 (Week 2): PT's family will initiate education to prepare for d/c home with min assist  Skilled Therapeutic Interventions/Progress Updates: Pt presented in bed agreeable to therapy. Pt denies pain but pointing at brief. Brief is dry but encouraged pt to try to ambulate to bathroom to attempt void if necessary. Pt performed supine to sit with supervision with bed mostly flat. PTA donned TED hose and threaded pants. Pt was able to pull pants up to hips in sitting. PTA donned socks total A for time management. Pt performed STS minA and ambulated to bathroom modA with RW. Pt with poor foot clearance and very resistive to keeping RW close to PT despite attempts by PTA to pull RW closer to pt. Pt performed toilet transfer to toilet with RW CGA and pulled down brief with CGA (-void). Pt performed standing to RW CGA and ambulated in same manner to w/c. Pt transported to rehab gym and performed stand pivot to mat. Attempted to engage pt in ping pong ball task however pt unable to follow commands to pick up ball and place in crate. Pt was able to grasp from PTA"s hand and place appropriately in crate. Pt engaged in ball tapping as pt was unable to follow command to catch ball. Pt then ambulated approx 2ft with RW to w/c with modA and max cues for staying within RW. Pt ambulated to shortened step length and poor clearance of RLE however pt was intermittently able to improve step length and foot clearance but not maintain consistently. Once in w/c pt transported back to room at end of session  and remained in w/c with belt alarm placed, call bell within reach and nsg present.      Therapy Documentation Precautions:  Precautions Precautions: Fall Precaution Comments: cortrak Restrictions Weight Bearing Restrictions: No    Therapy/Group: Individual Therapy  Candida Vetter  Tiffay Pinette, PTA  10/01/2019, 12:56 PM

## 2019-10-01 NOTE — Progress Notes (Signed)
Physical Therapy Session Note  Patient Details  Name: April Le MRN: [id number] Date of Birth: 07/22/1941  Today's Date: 10/01/2019 PT Individual Time: 1415-1500 PT Individual Time Calculation (min): 45 min   Short Term Goals: Week 2:  PT Short Term Goal 1 (Week 2): Pt will ambulate >23ft with min assist and LRAD PT Short Term Goal 2 (Week 2): Pt will attend to task for >5 minutes wiht mod cues from PT PT Short Term Goal 3 (Week 2): PT's family will initiate education to prepare for d/c home with min assist  Skilled Therapeutic Interventions/Progress Updates:    Pt supine in bed upon PT arrival, agreeable to therapy tx and denies pain. Pt transferred to sitting EOB with min assist and then automatically began to stand from EOB with min assist for safety, pt began trying to take steps, husband present to bring RW over. Pt with poor device management of RW, especially with turn to sit. Pt noted to be incontinent of bladder with soiled brief and pants. Pt performed stand pivot from w/c<>toilet with min assist, doffed soiled clothing, pt able to void and perform pericare, therapist assisted to don clean clothes/brief. Pt transported to the gym. Therapist attempting to get pt to stand and transfer to mat, pt with poor ability to follow commands and apraxia noted, pt does not perform transfer despite multimodal cues. Pt then transported to rehab apartment, therapist gestured for pt to transfer to couch, pt performs stand pivot with mod assist from w/c<>couch this session. Pt transported into rehab apartment kitchen, pt given cues to stand at the kitchen counter, pt performs sit<>stand with min assist. Pt worked on standing balance, command following and functional UE use in order to reach for items in cabinet (hand over hand assist), turn sink on/off, and open/close cabinet doors. Pt performed another sit<>stand at counter with min assist, while standing pt worked on task to place fruit into fruit bowl,  hand over hand assist to initiate the task. Pt transported back to room and left with needs in reach and chair alarm set, husband present.   Therapy Documentation Precautions:  Precautions Precautions: Fall Precaution Comments: cortrak Restrictions Weight Bearing Restrictions: No    Therapy/Group: Individual Therapy  Netta Corrigan, PT, DPT, CSRS 10/01/2019, 12:49 PM

## 2019-10-02 ENCOUNTER — Inpatient Hospital Stay (HOSPITAL_COMMUNITY): Payer: Medicare Other

## 2019-10-02 ENCOUNTER — Inpatient Hospital Stay (HOSPITAL_COMMUNITY): Payer: Medicare Other | Admitting: Physical Therapy

## 2019-10-02 ENCOUNTER — Inpatient Hospital Stay (HOSPITAL_COMMUNITY): Payer: Medicare Other | Admitting: Occupational Therapy

## 2019-10-02 LAB — BASIC METABOLIC PANEL
Anion gap: 14 (ref 5–15)
BUN: 23 mg/dL (ref 8–23)
CO2: 27 mmol/L (ref 22–32)
Calcium: 9.2 mg/dL (ref 8.9–10.3)
Chloride: 106 mmol/L (ref 98–111)
Creatinine, Ser: 1.13 mg/dL — ABNORMAL HIGH (ref 0.44–1.00)
GFR calc Af Amer: 54 mL/min — ABNORMAL LOW (ref >60)
GFR calc non Af Amer: 46 mL/min — ABNORMAL LOW (ref >60)
Glucose, Bld: 124 mg/dL — ABNORMAL HIGH (ref 70–99)
Potassium: 3.3 mmol/L — ABNORMAL LOW (ref 3.5–5.1)
Sodium: 147 mmol/L — ABNORMAL HIGH (ref 135–145)

## 2019-10-02 MED ORDER — LEVOTHYROXINE SODIUM 50 MCG PO TABS
50.0000 ug | ORAL_TABLET | Freq: Every day | ORAL | Status: DC
  Administered 2019-10-03 – 2019-10-09 (×7): 50 ug via ORAL
  Filled 2019-10-02 (×8): qty 1

## 2019-10-02 MED ORDER — FUROSEMIDE 40 MG PO TABS
40.0000 mg | ORAL_TABLET | Freq: Two times a day (BID) | ORAL | Status: DC
  Administered 2019-10-02 – 2019-10-09 (×14): 40 mg via ORAL
  Filled 2019-10-02 (×14): qty 1

## 2019-10-02 MED ORDER — ASPIRIN 325 MG PO TABS
325.0000 mg | ORAL_TABLET | Freq: Every day | ORAL | Status: DC
  Administered 2019-10-03 – 2019-10-09 (×7): 325 mg via ORAL
  Filled 2019-10-02 (×7): qty 1

## 2019-10-02 MED ORDER — POTASSIUM CHLORIDE CRYS ER 20 MEQ PO TBCR
20.0000 meq | EXTENDED_RELEASE_TABLET | Freq: Every day | ORAL | Status: DC
  Administered 2019-10-02 – 2019-10-09 (×8): 20 meq via ORAL
  Filled 2019-10-02 (×8): qty 1

## 2019-10-02 MED ORDER — ATORVASTATIN CALCIUM 10 MG PO TABS
20.0000 mg | ORAL_TABLET | Freq: Every day | ORAL | Status: DC
  Administered 2019-10-03 – 2019-10-08 (×6): 20 mg via ORAL
  Filled 2019-10-02 (×6): qty 2

## 2019-10-02 MED ORDER — DONEPEZIL HCL 10 MG PO TABS
5.0000 mg | ORAL_TABLET | Freq: Every day | ORAL | Status: DC
  Administered 2019-10-03 – 2019-10-09 (×7): 5 mg via ORAL
  Filled 2019-10-02 (×7): qty 1

## 2019-10-02 MED ORDER — METOPROLOL TARTRATE 50 MG PO TABS
50.0000 mg | ORAL_TABLET | Freq: Two times a day (BID) | ORAL | Status: DC
  Administered 2019-10-03 – 2019-10-09 (×13): 50 mg via ORAL
  Filled 2019-10-02 (×6): qty 1
  Filled 2019-10-02: qty 2
  Filled 2019-10-02 (×6): qty 1

## 2019-10-02 MED ORDER — CLOPIDOGREL BISULFATE 75 MG PO TABS
75.0000 mg | ORAL_TABLET | Freq: Every day | ORAL | Status: DC
  Administered 2019-10-03 – 2019-10-09 (×7): 75 mg via ORAL
  Filled 2019-10-02 (×7): qty 1

## 2019-10-02 MED ORDER — QUETIAPINE FUMARATE 25 MG PO TABS
25.0000 mg | ORAL_TABLET | Freq: Two times a day (BID) | ORAL | Status: DC
  Administered 2019-10-03 – 2019-10-09 (×13): 25 mg via ORAL
  Filled 2019-10-02 (×13): qty 1

## 2019-10-02 MED ORDER — QUETIAPINE FUMARATE 25 MG PO TABS
25.0000 mg | ORAL_TABLET | Freq: Two times a day (BID) | ORAL | Status: DC
  Administered 2019-10-02: 25 mg
  Filled 2019-10-02: qty 1

## 2019-10-02 MED ORDER — LEVETIRACETAM 100 MG/ML PO SOLN
500.0000 mg | Freq: Two times a day (BID) | ORAL | Status: DC
  Administered 2019-10-02 – 2019-10-09 (×14): 500 mg via ORAL
  Filled 2019-10-02 (×14): qty 5

## 2019-10-02 NOTE — Progress Notes (Signed)
Physical Therapy Session Note  Patient Details  Name: April Le MRN: [id number] Date of Birth: Feb 08, 1941  Today's Date: 10/02/2019 PT Individual Time: 1300-1410 PT Individual Time Calculation (min): 70 min   Short Term Goals: Week 2:  PT Short Term Goal 1 (Week 2): Pt will ambulate >42f with min assist and LRAD PT Short Term Goal 2 (Week 2): Pt will attend to task for >5 minutes wiht mod cues from PT PT Short Term Goal 3 (Week 2): PT's family will initiate education to prepare for d/c home with min assist  Skilled Therapeutic Interventions/Progress Updates:   Pt received sitting in WC and agreeable to PT.  Gait training with RW through day room x 461fwith min assist to improve lateral L weight shift. Dynamic gait training to weave through simulated home environment in day room x 4511fith min assist for weight shifting and AD management in turns to the L.  Dynamic balance training while performing horseshoe toss to target. Sit<>stand with min assist and min cues to push from WC x 4. Pt performed R and L lateral reach to obtain horse shoe 2 x 4 bil. With only min cues  For visual scanning to the R.   Ball taps with BUE while sitting in WC to improve visual scanning and functional use of RUE x 15. Progressed to tapping with BUE in standing x 8 with min assist to prevent posterior LOB and no UE supported on RW.   Pt returned to room and performed stand pivot transfer to bed with RW and min assist as listed above. Sit>supine completed with increased time and supervision assist for safety, and left supine in bed with call bell in reach and all needs met.       Therapy Documentation Precautions:  Precautions Precautions: Fall Precaution Comments: cortrak Restrictions Weight Bearing Restrictions: No Pain: denies   Therapy/Group: Individual Therapy  AusLorie Phenix/18/2020, 6:34 PM

## 2019-10-02 NOTE — Progress Notes (Signed)
[facility] PHYSICAL MEDICINE & REHABILITATION PROGRESS NOTE   Subjective/Complaints:  Able to say Hi and Bye, difficulty following commands without visual cues   ROS- limited by cognition. Milagros Reap   Objective:   [place] Ekg Site Rite  Result Date: 09/30/2019 If Site Rite image not attached, placement could not be confirmed due to current cardiac rhythm.  Recent Labs    09/30/19 0501  WBC 6.8  HGB 10.6*  HCT 33.2*  PLT 338   Recent Labs    09/30/19 0501 10/01/19 0506 10/02/19 0503  NA 143  --  147*  K 3.6  --  3.3*  CL 106  --  106  CO2 26  --  27  GLUCOSE 139*  --  124*  BUN 22  --  23  CREATININE 1.10* 1.01* 1.13*  CALCIUM 8.9  --  9.2    Intake/Output Summary (Last 24 hours) at 10/02/2019 [id number] Last data filed at 10/01/2019 2000 Gross per 24 hour  Intake 610 ml  Output -  Net 610 ml     Physical Exam: Vital Signs Blood pressure 131/70, pulse 70, temperature 98.5 F (36.9 C), temperature source Oral, resp. rate 19, height 5' (1.524 m), weight 67.5 kg, SpO2 100 %. Constitutional: No distress . Vital signs reviewed. HENT: Normocephalic.  Atraumatic. Eyes: EOMI. No discharge. Cardiovascular: No JVD. Respiratory: Normal effort.  No stridor. GI: Non-distended. Skin: Warm and dry.  Intact. Psych: Normal mood.  Normal behavior. Musc:no pain with UE LE ROM, no evidence of edema  Neurologic: Alert., severe aphasia, cannot state name, can close or open eyes to command but unable to follow other commands involving movement of limbs  Global aphasia exp>recept Motor: Limited due to participation, but appears to be moving all extremities spontaneously, unchanged  Assessment/Plan: 1. 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  Physiatrist is providing close team supervision and 24 hour management of active medical problems listed below.  Physiatrist and  rehab team continue to assess barriers to discharge/monitor patient progress toward functional and medical goals  Care Tool:  Bathing  Bathing activity did not occur: Refused Body parts bathed by patient: Face, Front perineal area   Body parts bathed by helper: Right arm, Left arm, Chest, Abdomen, Buttocks, Right upper leg, Left upper leg, Right lower leg, Left lower leg     Bathing assist Assist Level: 2 Helpers     Upper Body Dressing/Undressing Upper body dressing Upper body dressing/undressing activity did not occur (including orthotics): Refused What is the patient wearing?: Pull over shirt    Upper body assist Assist Level: Minimal Assistance - Patient > 75%    Lower Body Dressing/Undressing Lower body dressing    Lower body dressing activity did not occur: Refused What is the patient wearing?: Pants, Incontinence brief     Lower body assist Assist for lower body dressing: Moderate Assistance - Patient 50 - 74%     Toileting Toileting Toileting Activity did not occur Landscape architect and hygiene only): Refused  Toileting assist Assist for toileting: Maximal Assistance - Patient 25 - 49%     Transfers Chair/bed transfer  Transfers assist  Chair/bed transfer activity did not occur: Safety/medical concerns  Chair/bed transfer assist level: Moderate Assistance - Patient 50 - 74%     Locomotion Ambulation   Ambulation assist   Ambulation activity did not occur: Safety/medical concerns  Assist level: Moderate Assistance - Patient  50 - 74% Assistive device: Walker-rolling Max distance: 31ft   Walk 10 feet activity   Assist  Walk 10 feet activity did not occur: Safety/medical concerns  Assist level: Moderate Assistance - Patient - 50 - 74% Assistive device: Walker-rolling   Walk 50 feet activity   Assist Walk 50 feet with 2 turns activity did not occur: Safety/medical concerns         Walk 150 feet activity   Assist Walk 150 feet  activity did not occur: Safety/medical concerns         Walk 10 feet on uneven surface  activity   Assist Walk 10 feet on uneven surfaces activity did not occur: Safety/medical concerns         Wheelchair     Assist Will patient use wheelchair at discharge?: Yes   Wheelchair activity did not occur: Safety/medical concerns         Wheelchair 50 feet with 2 turns activity    Assist    Wheelchair 50 feet with 2 turns activity did not occur: Safety/medical concerns       Wheelchair 150 feet activity     Assist  Wheelchair 150 feet activity did not occur: Safety/medical concerns       Blood pressure 131/70, pulse 70, temperature 98.5 F (36.9 C), temperature source Oral, resp. rate 19, height 5' (1.524 m), weight 67.5 kg, SpO2 100 %.  Medical Problem List and Plan: 1.  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  Status post loop recorder placement  Cont CIR PT, OT,  SLP 2.  Antithrombotics:  -DVT/anticoagulation: Lovenox.             -antiplatelet therapy: Aspirin 325 mg daily, Plavix 75 mg daily- 3. Pain Management: Tylenol as needed 4. Mood: Aricept 5 mg daily, Seroquel 25 mg nightly             -antipsychotic agents: seroquel QHS for sundowning, increased to 50mg  on 11/13 5. Neuropsych: This patient is not capable of making decisions on her own behalf. 6. Skin/Wound Care: Routine skin checks 7. Fluids/Electrolytes/Nutrition: Routine in and outs 8.  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  Monitor for any signs of fluid overload.  Lasix 40 mg twice daily Filed Weights   09/25/19 0604 09/28/19 0514 09/29/19 0503  Weight: 70 kg 68 kg 67.5 kg   Stable on 11/17 10.  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 ?AKI versus CKD stage III.    Creatinine 1.32 on 11/13, Creat 1.01 today improved  Encourage fluids, however may be difficult to maintain given nectar liquids DC  IVF  11.  Hypertension.  Lopressor 50 mg twice daily.  Monitor with increased mobility Vitals:   10/02/19 0522 10/02/19 0526  BP: 119/71 131/70  Pulse: (!) 50 70  Resp:    Temp:    SpO2: 96% 100%  controlled  12.  Hypothyroidism.  Synthroid 13.  Hyperlipidemia.  Lipitor 14.  Obesity: BMI 29.7 15. Post stroke seizure: complex partial   Continue Keppra 500mg  BID po as per Neuro, neuro has signed off but they will need to see her as an outpatient  No reported seizures  16.  OSA CPAP with oxygen at home. 17. Bradycardia:  Due to BB- stable  18.  Acute blood loss  anemia  Hemoglobin 11.2 on 11/6, labs ordered for tomorrow 19.  HypoK due to loop diuretic, start Kdur 78meq today  LOS:15 days A FACE TO FACE EVALUATION WAS PERFORMED  Charlett Blake 10/02/2019, 8:21 AM

## 2019-10-02 NOTE — Progress Notes (Signed)
Occupational Therapy Weekly Progress Note  Patient Details  Name: Camiya Vinal MRN: [id number] Date of Birth: [birth year]  Beginning of progress report period: September 25, 2019 End of progress report period: October 02, 2019  Today's Date: 10/02/2019 OT Individual Time: 4503-8882 OT Individual Time Calculation (min): 55 min    Patient has met 2 of 3 short term goals. Pt making progress towards goals but tends to fluctuate with self care tasks and transfers depending on level of fatigue and cognition. Pt currently performing UB self care at min A, LB self care at mod A, toileting at mod A, and functional transfers with min - mod A. Pt continues to have significant language deficits and poor frustration tolerance at time. Pt continues to benefit from OT intervention.   Patient continues to demonstrate the following deficits: muscle weakness, decreased cardiorespiratoy endurance, ataxia, decreased coordination and decreased motor planning, decreased initiation, decreased attention, decreased awareness, decreased problem solving and decreased safety awareness and decreased sitting balance, decreased standing balance and decreased balance strategies and therefore will continue to benefit from skilled OT intervention to enhance overall performance with BADL and Reduce care partner burden.  Patient progressing toward long term goals..  Continue plan of care. Goals downgraded to min A overall.   OT Short Term Goals Week 2:  OT Short Term Goal 1 (Week 2): Pt will perform bathing tasks with mod A overall. OT Short Term Goal 1 - Progress (Week 2): Met OT Short Term Goal 2 (Week 2): Pt will perform LB dressing with mod A overall. OT Short Term Goal 2 - Progress (Week 2): Met OT Short Term Goal 3 (Week 2): Pt will perform grooming tasks at sink with min A overall. OT Short Term Goal 3 - Progress (Week 2): Not met OT Short Term Goal 4 (Week 2): Pt will perform toileting with min A overall. Week 3:  OT  Short Term Goal 1 (Week 3): STGs=LTGs secondary to upcoming discharge  Skilled Therapeutic Interventions/Progress Updates:    Upon entering the room, pt supine in bed with IV nurse present. Pt required mod - max multimodal cuing this session. Pt transferred into wheelchair with mod A stand pivot transfer and assisted into bathroom for time toileting. Pt's brief was wet and pt transferred onto commode chair with mod A. Pt sitting on commode for 15 minutes but unable to void further and required max A for hygiene and clothing management before returning to wheelchair. Pt seated in wheelchair at sink for grooming tasks with min A and cuing for sequencing. Pt does much better this session with demonstrational cuing vs verbal cuing. Pt brushing teeth and then therapist assisted for thoroughness. Pt taking 7-8 sips of water from cup without any issues. Pt returning to bed at end of session in same manner as above. Call bell and all needed items within reach. Bed alarm activated and call bell within reach.   Therapy Documentation Precautions:  Precautions Precautions: Fall Precaution Comments: cortrak Restrictions Weight Bearing Restrictions: No    Pain: Pain Assessment Pain Score: 0-No pain   Therapy/Group: Individual Therapy  Gypsy Decant 10/02/2019, 11:30 AM

## 2019-10-02 NOTE — Progress Notes (Signed)
Speech Language Pathology Daily Session Note  Patient Details  Name: April Le MRN: [id number] Date of Birth: 1941/06/21  Today's Date: 10/02/2019 SLP Individual Time: [id number] SLP Individual Time Calculation (min): 55 min  Short Term Goals: Week 2: SLP Short Term Goal 1 (Week 2): Pt will consume dysphagia 2 diet with nectar thick liquids with supervision level cues for use of compensatory swallow strategies. SLP Short Term Goal 2 (Week 2): Pt will consume trials of dysphagia 3 over 3 sessions with minimal overt s/s of aspiration and Min A cues for use of compensatory swallow strategies. SLP Short Term Goal 3 (Week 2): Pt will consume trials of thin liquids with minimal overt s/s of aspiration over 5 sessions to demonstrate readiness for repeat instrumental swallow study. SLP Short Term Goal 4 (Week 2): Pt will demonstrate sustained attention to task for 5 minutes with Min A cues. SLP Short Term Goal 5 (Week 2): Pt will utilize multi-modal communication to express basic wants/needs in 5 out of 10 opportunities and Mod A cues. SLP Short Term Goal 6 (Week 2): Pt will utilize word finding strategies to produce phrase length utterances that are ~ 25% intelligibility with Max A cues.  Skilled Therapeutic Interventions: Skilled ST services focused on swallow and language skills. SLP facilitated oral care piror to thin trials, pt required mod A verbal cues for problem solving during oral care and consumed x10 TSP and x10 small cup sips with no overt s/s aspiration. Pt continues to demonstrate increase oral control and awareness with thin liquids, SLP recommends if continued trials remain successful for repeat instrumental study. Pt demonstrated ability to name presented number In 2 out 5 numbers (1-5) and  In 5 trials. Pt was unable to count 1-5 nor identify presented numbers 1-5. Pt demonstrated ability to name familiar and common objects in 1 out 4 trials with max A phonemic and carrier phrase cues.  Pt demonstrated ability to repeat name of objects in 2 out 4 trials with phonemic errors. Pt was able to match object to photograph in a field of 2 in 1 out 4 opportunities on 1st trial, however demonstrated inconsistencies demonstrating 0 out 4 accuracy on 2nd trial with same items. Pt consumed small dys 3 snack trial with appropriate oral clearance given NTL liquid wash. Pt was left in room with call bell within reach and bed alarm set. ST recommends to continue skilled ST services.   As of note, SLP downgraded communication and cognitive goals due to lack of progress.      Pain Pain Assessment Pain Score: 0-No pain  Therapy/Group: Individual Therapy  Davionne Mastrangelo  [facility] 10/02/2019, 10:29 AM

## 2019-10-02 NOTE — Plan of Care (Signed)
  Problem: Consults Goal: RH STROKE PATIENT EDUCATION Description: See Patient Education module for education specifics  Outcome: Progressing   Problem: RH BOWEL ELIMINATION Goal: RH STG MANAGE BOWEL WITH ASSISTANCE Description: STG Manage Bowel with [place]. Outcome: Progressing Goal: RH STG MANAGE BOWEL W/MEDICATION W/ASSISTANCE Description: STG Manage Bowel with Medication with min Assistance. Outcome: Progressing   Problem: RH BLADDER ELIMINATION Goal: RH STG MANAGE BLADDER WITH ASSISTANCE Description: STG Manage Bladder With min Assistance Outcome: Progressing   Problem: RH SAFETY Goal: RH STG ADHERE TO SAFETY PRECAUTIONS W/ASSISTANCE/DEVICE Description: STG Adhere to Safety Precautions With min Assistance/Device. Outcome: Progressing   Problem: RH COGNITION-NURSING Goal: RH STG ANTICIPATES NEEDS/CALLS FOR ASSIST W/ASSIST/CUES Description: STG Anticipates Needs/Calls for Assist With min Assistance/Cues. Outcome: Progressing   Problem: RH PAIN MANAGEMENT Goal: RH STG PAIN MANAGED AT OR BELOW PT'S PAIN GOAL Description: < 3 out of 10.  Outcome: Progressing

## 2019-10-02 NOTE — Patient Care Conference (Signed)
Inpatient RehabilitationTeam Conference and Plan of Care Update Date: 10/02/2019   Time: 10:00 AM    Patient Name: April Le      Medical Record Number: [id number]  Date of Birth: May 25, 1941 Sex: Female         Room/Bed: 4W14C/4W14C-01 Payor Info: Payor: [employer] MEDICARE / Plan: [facility] MEDICARE / Product Type: *No Product type* /    Admit Date/Time:  09/17/2019  6:04 PM  Primary Diagnosis:  Left middle cerebral artery stroke [facility])  Patient Active Problem List   Diagnosis Date Noted  . Chronic diastolic congestive heart failure ([place])   . Bradycardia   . Seizure ([place])   . AKI (acute kidney injury) ([place])   . Sundowning   . Dysphagia, post-stroke   . Left middle cerebral artery stroke ([place]) 09/17/2019  . Hx of completed stroke 09/16/2019  . Dysphagia following cerebrovascular accident (CVA) 09/16/2019  . Malnutrition ([place]) 09/16/2019  . Acute blood loss anemia 09/16/2019  . Thrombocytopenia ([place]) 09/16/2019  . Acute ischemic left MCA stroke ([facility]) s/p IR, embolic source unknown [id number]  . 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 Bilateral lower extremity edema 07/18/2019  . Weakness of left leg   . Urinary incontinence   . Tumor of ovary   . Stroke ([place])   . Sinus drainage   . Shortness of breath   . Pneumonia   . Hypothyroidism   . Hypertension   . Hypercholesteremia   . Headache   . GERD (gastroesophageal reflux disease)   . Complication of anesthesia   . CKD (chronic kidney disease), stage III   . Arthritis   . Aphasia due to acute stroke ([place])   . Abdominal distention   . Coronary artery calcification seen on CT scan 01/10/2017  . Hypertensive heart disease with heart failure ([place]) 01/10/2017  . Abnormality of gait 10/28/2015  . 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 Chronic diastolic heart failure ([place]) 11/19/2013  . Benign essential HTN 11/19/2013  . OSA (obstructive sleep apnea) 11/19/2013   . CAP (community acquired pneumonia) 09/07/2013  . Leukocytosis 09/04/2013  . Fever of unknown origin 09/04/2013  . Hypoxia 09/04/2013  . SOB (shortness of breath) 09/04/2013  . SIRS (systemic inflammatory response syndrome) ([place]) 09/04/2013  . Mass of ovary 10/26/2011    Expected Discharge Date: Expected Discharge Date: 10/09/19  Team Members Present: Physician leading conference: Dr. Alysia Penna Social Worker Present: Ovidio Kin, LCSW Nurse Present: Judee Clara, LPN Case manager: Karene Fry, RN PT Present: Barrie Folk, PT;Rosita Dechalus, PTA OT Present: Darleen Crocker, OT SLP Present: Charolett Bumpers, SLP PPS Coordinator present : Ileana Ladd, PT     Current Status/Progress Goal Weekly Team Focus  Bowel/Bladder   incontinet of b/b LMBL11/17  continue time tolieting while awake  assist with tolieting needs prn   Swallow/Nutrition/ Hydration   dys 2 and NTL, Min A. water protocol going well via TSP and small cup sips  Supervision A  continue thin and dys 3 trials with repeat MBS   ADL's   mod - max for self care tasks, mod A sit <>stand, mod A for toileting  supervision but will downgrade to min A today  functional transfers, ADL retraining, sitting/standing balance, R NMR, family edu   Mobility   min to modA bed mobility, minA STS, modA gait and poor safety awareness,  minA overall  NMR, cognitivie remedation, gait, balance, transfers   Communication   Max  A  Mod A - downgraded 11/18  wants/needs with mutlimodal, phrase level for melodic intotation therpay, and naming common objects   Safety/Cognition/ Behavioral Observations  Max-mod A  mod A - downgraded 11/18  following commands and sustained attention   Pain   no c/o of pain  remain pain free  assess pain QS and prn   Skin   ecchymosis of BLE/BUE  reamin free of new skin breakdown  asses skin QS and prn      *See Care Plan and progress notes for long and short-term goals.     Barriers to  Discharge  Current Status/Progress Possible Resolutions Date Resolved   Nursing                  PT                    OT                  SLP                SW                Discharge Planning/Teaching Needs:  Family plans on taking pt home but will need to be at a certain level for husband to assist and have encouraged to look into private duty caregivers. Husband is here daily to provide support and observe in therapies.      Team Discussion: Off IV fluids, improved oral intake, safety issues.  RN - tries to get up OOB, ? need tele sitter, trouble reorienting, does not take meds, pulled out PICC and IV line, confused.  OT restless, confused when family leave, hard to redirect, mod/max self care, max/total toileting, S goals, ? Downgrade goals.  PT min/mod overall, mod gait, decreased safety awareness, S bed, min a transfers, min A goals.  SLP D2nectar thick liquids, trials of D3 thins, another swallow test soon, communication mod A goals, aphasia, apraxia.  Husband may need to hire help or ? Need SNF placement.   Revisions to Treatment Plan: N/A     Medical Summary Current Status: Discussed fall risk with nursing.  Patient is close to the nurses desk.  Oral intake improved Weekly Focus/Goal: No PEG needed, IV discontinued  Barriers to Discharge: Nutrition means;Medical stability   Possible Resolutions to Barriers: Family training with feeding   Continued Need for Acute Rehabilitation Level of Care: The patient requires daily medical management by a physician with specialized training in physical medicine and rehabilitation for the following reasons: Direction of a multidisciplinary physical rehabilitation program to maximize functional independence : Yes Medical management of patient stability for increased activity during participation in an intensive rehabilitation regime.: Yes Analysis of laboratory values and/or radiology reports with any subsequent need for medication  adjustment and/or medical intervention. : Yes   I attest that I was present, lead the team conference, and concur with the assessment and plan of the team.   Retta Diones 10/02/2019, 7:28 PM  Team conference was held via web/ teleconference due to [place] - 19

## 2019-10-02 NOTE — Plan of Care (Signed)
Goals downgraded to min A for safety

## 2019-10-02 NOTE — Progress Notes (Signed)
Social Work Patient ID: April Le, female   DOB: 1941-09-20, 78 y.o.   MRN: [id number]  Met with husband and spoke with daughter to discuss team conference progress toward her goals and discharge still 11/25. Daughter is looking into private duty caregivers and discussing Mom's needs at home-ie hospital bed, bedside commode, etc. Husband and daughter aware of pt's need for 24 hr physical care at discharge, will work toward this.

## 2019-10-03 ENCOUNTER — Inpatient Hospital Stay (HOSPITAL_COMMUNITY): Payer: Medicare Other | Admitting: Physical Therapy

## 2019-10-03 ENCOUNTER — Inpatient Hospital Stay (HOSPITAL_COMMUNITY): Payer: Medicare Other | Admitting: Speech Pathology

## 2019-10-03 ENCOUNTER — Inpatient Hospital Stay (HOSPITAL_COMMUNITY): Payer: Medicare Other

## 2019-10-03 ENCOUNTER — Inpatient Hospital Stay (HOSPITAL_COMMUNITY): Payer: Medicare Other | Admitting: Occupational Therapy

## 2019-10-03 MED ORDER — ENSURE ENLIVE PO LIQD
237.0000 mL | Freq: Three times a day (TID) | ORAL | Status: DC
  Administered 2019-10-03 – 2019-10-09 (×13): 237 mL via ORAL

## 2019-10-03 NOTE — Progress Notes (Signed)
Speech Language Pathology Daily Session Note  Patient Details  Name: April Le MRN: [id number] Date of Birth: [birth year]  Today's Date: 10/03/2019 SLP Individual Time: 0730-0830 SLP Individual Time Calculation (min): 60 min  Short Term Goals: Week 2: SLP Short Term Goal 1 (Week 2): Pt will consume dysphagia 2 diet with nectar thick liquids with supervision level cues for use of compensatory swallow strategies. SLP Short Term Goal 2 (Week 2): Pt will consume trials of dysphagia 3 over 3 sessions with minimal overt s/s of aspiration and Min A cues for use of compensatory swallow strategies. SLP Short Term Goal 3 (Week 2): Pt will consume trials of thin liquids with minimal overt s/s of aspiration over 5 sessions to demonstrate readiness for repeat instrumental swallow study. SLP Short Term Goal 4 (Week 2): Pt will demonstrate sustained attention to task for 5 minutes with Min A cues. SLP Short Term Goal 5 (Week 2): Pt will utilize multi-modal communication to express basic wants/needs in 5 out of 10 opportunities and Mod A cues. SLP Short Term Goal 6 (Week 2): Pt will utilize word finding strategies to produce phrase length utterances that are ~ 25% intelligibility with Max A cues.  Skilled Therapeutic Interventions:  Skilled treatment session focused on dysphagia and aphasia. SLP received pt upright in wheelchair, consuming breakfast tray of dysphagia 2 with nectar thick liquids. SLP supplemented current items with dysphagia 3 food items. Pt continued to demonstrate good oral clearing and no overt s/s of aspiration. Therefore, recommend upgrading pt to dysphagia 3. Additionally, pt's nurse arrived with medication and skilled observation was provided of pt consuming medicine whole with nectar thick liquids. Pt consumed without any refusal or overt s/s of aspiration.   Skilled treatment session also focused on communication and receptive language. When presented with only verbal cues, pt unable  to follow any 1 step directions. When initial gestural cues were added, pt able to demonstrate requested direction. Pt unable to imitate any rote language but didn't intermittently say "ok" in response to statements presented with question intonation. So pt able to perceive intonation.   Recommend repeat MBS on 10/04/19 to assess safety of possible upgrade to thin liquids.      Pain    Therapy/Group: Individual Therapy  April Le 10/03/2019, 2:15 PM

## 2019-10-03 NOTE — Progress Notes (Signed)
Social Work Patient ID: April Le, female   DOB: 23-May-1941, 78 y.o.   MRN: [id number]     Diagnosis code: I63.512, I50.32 & R56.9  Height: 5'0  Weight: 158 lbs   Patient has L-CVA and deficits from past CVA's which requires her upper body to be positioned in ways not feasible with a normal bed.  Head must be elevated at least 30 degrees due to risk of aspiration. Patient requires frequent and immediate changes in body position which cannot be achieved with a normal bed.

## 2019-10-03 NOTE — Progress Notes (Signed)
Occupational Therapy Session Note  Patient Details  Name: April Le MRN: [id number] Date of Birth: 1941/05/26  Today's Date: 10/03/2019 OT Individual Time: 1300-1341 OT Individual Time Calculation (min): 41 min  20 missed minutes  Short Term Goals: Week 3:  OT Short Term Goal 1 (Week 3): STGs=LTGs secondary to upcoming discharge  Skilled Therapeutic Interventions/Progress Updates:    Upon entering the room, pt seated in wheelchair next to husband and is sleeping soundly. Caregiver reports, "She has been sleeping like this for awhile now." Pt is very difficulty to arouse for OT intervention. Pt seated in wheelchair at sink and doffing dirty clothing. Pt needing mod multimodal cues to initiate and sequence UB dressing but donned pull over shirt with mod A to thread over head and pull down trunk. OT assisted pt to bathroom for time toileting via wheelchair. Pt standing multiple times for therapist to remove LB clothing and brief. Pt transferred from wheelchair > toilet with max cuing and sitting for a few minutes but unable to void and is gesturing for clothing. Pt needing max A to thread pants onto B feet and over hips. Pt attempting to assist but then requiring additional assistance with balance. Pt returning to wheelchair and then transferred into bed with min A stand pivot transfer. Min guard for sit >supine. Pt very fatigued and asleep as OT exited the room this session. Bed alarm activated and mats placed on floor for safety. 20 missed minutes secondary to fatigue.   Therapy Documentation Precautions:  Precautions Precautions: Fall Precaution Comments: cortrak Restrictions Weight Bearing Restrictions: No General: General OT Amount of Missed Time: 20 Minutes     Therapy/Group: Individual Therapy  Gypsy Decant 10/03/2019, 1:54 PM

## 2019-10-03 NOTE — Progress Notes (Signed)
Nutrition Follow-up  DOCUMENTATION CODES:   Not applicable  INTERVENTION:  Provide Ensure Enlive po TID (thickened to nectar thick consistency), each supplement provides 350 kcal and 20 grams of protein.  Encourage adequate PO intake.   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 Presented 09/11/2019 nonverbal with right side weakness. CT angiogram of head and neck showed left distal M1 embolism extending into proximal M2 branches with poorly enhancing downstream vessels. Remote moderate left lateral frontal and upper insular infarction. CT perfusion showed a 38 cc acute infarct and 56 cc of superimposed penumbra. Underwent cerebral angiogram with complete revascularization of occluded Lt LMCA M1 segment. MRI follow-up showed acute cortical infarct involving majority of left MCA distribution. Patient is currently n.p.o. with nasogastric tube feeds.11/12-  Diet advanced, tube feeding discontinued, NGT removed.   Pt is currently on a dysphagia 3 diet with nectar thick liquids. Meal completion has been varied from 5-75%. Pt refused lunch today. Husband at bedside reports pt more fatigued today than usual. Pt currently has Ensure ordered with varied consumption. RD to increase Ensure to TID to aid in caloric and protein needs. Husband has been encouraging pt to eat her food at meals and to drink her  nutritional supplements.   Labs and medications reviewed. Sodium elevated at 147.   Diet Order:   Diet Order            DIET DYS 3 Room service appropriate? Yes; Fluid consistency: Nectar Thick  Diet effective now              EDUCATION NEEDS:   Not appropriate for education at this time  Skin:  Skin Assessment: Reviewed RN Assessment  Last BM:  11/17  Height:   Ht Readings from Last 1 Encounters:  09/17/19 5' (1.524 m)    Weight:   Wt Readings from Last 1 Encounters:  10/03/19 66.1 kg    Ideal Body Weight:  45.45 kg  BMI:  Body mass index is 28.46 kg/m.  Estimated Nutritional Needs:   Kcal:  1500-1800  Protein:  70-90 grams  Fluid:  >/= 1.5 L/day    Corrin Parker, MS, RD, LDN Pager # [phone] After hours/ weekend pager # [phone]

## 2019-10-03 NOTE — Progress Notes (Signed)
Physical Therapy Weekly Progress Note  Patient Details  Name: April Le MRN: [id number] Date of Birth: 1941-10-12  Beginning of progress report period: September 26, 2019 End of progress report period: October 03, 2019  Today's Date: 10/03/2019 PT Individual Time: 5170-0174 PT Individual Time Calculation (min): 45 min   Patient has met 3 of 3 short term goals.  Pt making steady progress towards LTG. Improved receptive aphasia within a functional task, improved strength and coordination of the RUE, and awareness of deficits have allowed pt to improve to min assist overall with assist for weight shift off the RLE in transfers and gait. Bed mobility with as little as CGA for safe set up. Family has been present for multiple PT sessions, and are aware of min assist level goals for d/c. Plan to have family engage in consistent hands on training as pt progresses to d/c date. 3  Patient continues to demonstrate the following deficits muscle weakness and muscle joint tightness, decreased cardiorespiratoy endurance, impaired timing and sequencing, abnormal tone, unbalanced muscle activation, motor apraxia, decreased coordination and decreased motor planning, decreased attention to right, decreased initiation, decreased attention, decreased awareness, decreased problem solving, decreased safety awareness, decreased memory and delayed processing and decreased sitting balance, decreased standing balance, decreased postural control, hemiplegia and decreased balance strategies and therefore will continue to benefit from skilled PT intervention to increase functional independence with mobility.  Patient progressing toward long term goals..  Continue plan of care.  PT Short Term Goals Week 2:  PT Short Term Goal 1 (Week 2): Pt will ambulate >23f with min assist and LRAD PT Short Term Goal 1 - Progress (Week 2): Met PT Short Term Goal 2 (Week 2): Pt will attend to task for >5 minutes wiht mod cues from  PT PT Short Term Goal 2 - Progress (Week 2): Met PT Short Term Goal 3 (Week 2): PT's family will initiate education to prepare for d/c home with min assist PT Short Term Goal 3 - Progress (Week 2): Met Week 3:  PT Short Term Goal 1 (Week 3): STG=LTG due to ELOS  Skilled Therapeutic Interventions/Progress Updates:   Pt received sitting in WC and agreeable to PT. Gait training with RW x 97fwith min assist for R weight shift to improve L foot clearance.    Stair management training, 3 inch step x 8 with min fading to mod assist with fatigue in descent. Pt noted to have increasing flexed posture and requiring cues for safety and step-to gait pattern.   Pt performed sustatianed attention task as a well as dynamic balacne training of connect four. Unable to comprehand game, but was able to coordinate BUE ot place pieces in row. Completed x 10, and x 5 with min assist to prevent posterior LOB in standing.   Lateral reaching to the L to force weight shifting to grab bean bag. Multimodal cues for imrpoved erect posture, terminal knee extension in LE and improved visual scanning to locate object in both visual fields.   Patient returned to room and [facility] and all needs met.        Therapy Documentation Precautions:  Precautions Precautions: Fall Precaution Comments: cortrak Restrictions Weight Bearing Restrictions: No Pain: denies  Therapy/Group: Individual Therapy  AuLorie Phenix1/19/2020, 2:06 PM

## 2019-10-03 NOTE — Progress Notes (Signed)
[facility] PHYSICAL MEDICINE & REHABILITATION PROGRESS NOTE   Subjective/Complaints:   No issues overnite    ROS- limited by cognition. April Le   Objective:   No results found. No results for input(s): WBC, HGB, HCT, PLT in the last 72 hours. Recent Labs    10/01/19 0506 10/02/19 0503  NA  --  147*  K  --  3.3*  CL  --  106  CO2  --  27  GLUCOSE  --  124*  BUN  --  23  CREATININE 1.01* 1.13*  CALCIUM  --  9.2    Intake/Output Summary (Last 24 hours) at 10/03/2019 0752 Last data filed at 10/02/2019 1900 Gross per 24 hour  Intake 210 ml  Output -  Net 210 ml     Physical Exam: Vital Signs Blood pressure (!) 153/66, pulse 67, temperature 98.3 F (36.8 C), temperature source Oral, resp. rate 16, height 5' (1.524 m), weight 66.1 kg, SpO2 92 %. Constitutional: No distress . Vital signs reviewed. HENT: Normocephalic.  Atraumatic. Eyes: EOMI. No discharge. Cardiovascular: No JVD. Respiratory: Normal effort.  No stridor. GI: Non-distended. Skin: Warm and dry.  Intact. Psych: Normal mood.  Normal behavior. Musc:no pain with UE LE ROM, no evidence of edema  Neurologic: Alert., severe aphasia, cannot state name, can close or open eyes to command but unable to follow other commands involving movement of limbs  Global aphasia exp>recept Motor: Limited due to participation, but appears to be moving all extremities spontaneously, unchanged  Assessment/Plan: 1. 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  Physiatrist is providing close team supervision and 24 hour management of active medical problems listed below.  Physiatrist and rehab team continue to assess barriers to discharge/monitor patient progress toward functional and medical goals  Care Tool:  Bathing  Bathing activity did not occur: Refused Body parts bathed by patient: Face, Front perineal area    Body parts bathed by helper: Right arm, Left arm, Chest, Abdomen, Buttocks, Right upper leg, Left upper leg, Right lower leg, Left lower leg     Bathing assist Assist Level: 2 Helpers     Upper Body Dressing/Undressing Upper body dressing Upper body dressing/undressing activity did not occur (including orthotics): Refused What is the patient wearing?: Pull over shirt    Upper body assist Assist Level: Minimal Assistance - Patient > 75%    Lower Body Dressing/Undressing Lower body dressing    Lower body dressing activity did not occur: Refused What is the patient wearing?: Pants, Incontinence brief     Lower body assist Assist for lower body dressing: Moderate Assistance - Patient 50 - 74%     Toileting Toileting Toileting Activity did not occur Landscape architect and hygiene only): Refused  Toileting assist Assist for toileting: Maximal Assistance - Patient 25 - 49%     Transfers Chair/bed transfer  Transfers assist  Chair/bed transfer activity did not occur: Safety/medical concerns  Chair/bed transfer assist level: Minimal Assistance - Patient > 75%     Locomotion Ambulation   Ambulation assist   Ambulation activity did not occur: Safety/medical concerns  Assist level: Minimal Assistance - Patient > 75% Assistive device: Walker-rolling Max distance: 45   Walk 10 feet activity   Assist  Walk 10 feet activity did not occur: Safety/medical concerns  Assist level: Minimal Assistance - Patient > 75% Assistive device: Walker-rolling   Walk 50 feet activity  Assist Walk 50 feet with 2 turns activity did not occur: Safety/medical concerns         Walk 150 feet activity   Assist Walk 150 feet activity did not occur: Safety/medical concerns         Walk 10 feet on uneven surface  activity   Assist Walk 10 feet on uneven surfaces activity did not occur: Safety/medical concerns         Wheelchair     Assist Will patient use  wheelchair at discharge?: Yes   Wheelchair activity did not occur: Safety/medical concerns         Wheelchair 50 feet with 2 turns activity    Assist    Wheelchair 50 feet with 2 turns activity did not occur: Safety/medical concerns       Wheelchair 150 feet activity     Assist  Wheelchair 150 feet activity did not occur: Safety/medical concerns       Blood pressure (!) 153/66, pulse 67, temperature 98.3 F (36.8 C), temperature source Oral, resp. rate 16, height 5' (1.524 m), weight 66.1 kg, SpO2 92 %.  Medical Problem List and Plan: 1.  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  Status post loop recorder placement  Cont CIR PT, OT,  SLP 2.  Antithrombotics:  -DVT/anticoagulation: Lovenox.             -antiplatelet therapy: Aspirin 325 mg daily, Plavix 75 mg daily- 3. Pain Management: Tylenol as needed 4. Mood: Aricept 5 mg daily, Seroquel 25 mg nightly             -antipsychotic agents: seroquel QHS for sundowning, increased to 50mg  on 11/13 5. Neuropsych: This patient is not capable of making decisions on her own behalf. 6. Skin/Wound Care: Routine skin checks 7. Fluids/Electrolytes/Nutrition: Routine in and outs 8.  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  Monitor for any signs of fluid overload.  Lasix 40 mg twice daily Filed Weights   09/28/19 0514 09/29/19 0503 10/03/19 0535  Weight: 68 kg 67.5 kg 66.1 kg   Stable on 11/17 10.  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 ?AKI versus CKD stage III.    Creatinine 1.32 on 11/13, Creat 1.01 today improved  Encourage fluids, however may be difficult to maintain given nectar liquids DC  IVF  11.  Hypertension.  Lopressor 50 mg twice daily.  Monitor with increased  mobility Vitals:   10/02/19 1932 10/03/19 0537  BP: 139/60 (!) 153/66  Pulse: 79 67  Resp: 16 16  Temp: 98.4 F (36.9 C) 98.3 F (36.8 C)  SpO2: 93% [id number]  elevated systolic  12.  Hypothyroidism.  Synthroid 13.  Hyperlipidemia.  Lipitor 14.  Obesity: BMI 29.7 15. Post stroke seizure: complex partial   Continue Keppra 500mg  BID po as per Neuro, neuro has signed off but they will need to see her as an outpatient  No reported seizures  16.  OSA CPAP with oxygen at home. 17. Bradycardia:  Due to BB- stable  18.  Acute blood loss anemia  Hemoglobin 11.2 on 11/6, labs ordered for tomorrow 19.  HypoK due to loop diuretic, start [place] 12meq 11/18 , repeat BMET in am  LOS:16 days A FACE TO [place] 10/03/2019, 7:52  AM    

## 2019-10-03 NOTE — Progress Notes (Signed)
Physical Therapy Session Note  Patient Details  Name: April Le MRN: [id number] Date of Birth: [birth year]  Today's Date: 10/03/2019 PT Individual Time: 1130-1200 PT Individual Time Calculation (min): 30 min   Short Term Goals: Week 1:  PT Short Term Goal 1 (Week 1): Pt will perform bed mobility with mod assist PT Short Term Goal 1 - Progress (Week 1): Met PT Short Term Goal 2 (Week 1): Pt will transfer to [facility] with mod assist PT Short Term Goal 2 - Progress (Week 1): Met PT Short Term Goal 3 (Week 1): Pt will remain OOB up to 2 hours PT Short Term Goal 3 - Progress (Week 1): Met Week 2:  PT Short Term Goal 1 (Week 2): Pt will ambulate >73f with min assist and LRAD PT Short Term Goal 1 - Progress (Week 2): Met PT Short Term Goal 2 (Week 2): Pt will attend to task for >5 minutes wiht mod cues from PT PT Short Term Goal 2 - Progress (Week 2): Met PT Short Term Goal 3 (Week 2): PT's family will initiate education to prepare for d/c home with min assist PT Short Term Goal 3 - Progress (Week 2): Met Week 3:  PT Short Term Goal 1 (Week 3): STG=LTG due to ELOS  Skilled Therapeutic Interventions/Progress Updates:    PAIN  Pt nods no to pain.  Pt initially OOB in wc w/husband in room.  Transported to gym for the following activities: Standing balance - STS from wc w/min assist, mod cues for erect posture. Some delay w/STS due to motor planning deficits. Worked on turning slightly and picking up clips to pt R and attempting to clip to net forward and overhead, pt able to lift clips to net but due to motor planning issues could not operate clips and grabbed net instead.  Min to mod assist for balance w/task.  Standing turning to obtain horseshoes placed at chair height to sides, reaching forward and at chest height to hang horseshoes on handle w/L and R hands, cues for erect posture, min to mod assist for balance, come facilitation initially to aid w/planning task.    Gait:  348f 1, 2587f 1  pushing weighted grocery cart w/min assist for wt shifting, poor clearance bilat R>L and overall flexed posture.  Pt transported back to room and left OOB in wc at husbands side w/alarm belt set and needs in reach. Husband informed re pt treatment session/performance.   Therapy Documentation Precautions:  Precautions Precautions: Fall Precaution Comments: cortrak Restrictions Weight Bearing Restrictions: No   Therapy/Group: Individual Therapy  BarCallie FieldingT [place]/19/2020, 12:25 PM

## 2019-10-04 ENCOUNTER — Encounter (HOSPITAL_COMMUNITY): Payer: Medicare Other | Admitting: Speech Pathology

## 2019-10-04 ENCOUNTER — Inpatient Hospital Stay (HOSPITAL_COMMUNITY): Payer: Medicare Other | Admitting: Physical Therapy

## 2019-10-04 ENCOUNTER — Inpatient Hospital Stay (HOSPITAL_COMMUNITY): Payer: Medicare Other

## 2019-10-04 ENCOUNTER — Inpatient Hospital Stay (HOSPITAL_COMMUNITY): Payer: Medicare Other | Admitting: Speech Pathology

## 2019-10-04 ENCOUNTER — Inpatient Hospital Stay (HOSPITAL_COMMUNITY): Payer: Medicare Other | Admitting: Occupational Therapy

## 2019-10-04 LAB — BASIC METABOLIC PANEL
Anion gap: 12 (ref 5–15)
BUN: 29 mg/dL — ABNORMAL HIGH (ref 8–23)
CO2: 24 mmol/L (ref 22–32)
Calcium: 9.3 mg/dL (ref 8.9–10.3)
Chloride: 110 mmol/L (ref 98–111)
Creatinine, Ser: 1.3 mg/dL — ABNORMAL HIGH (ref 0.44–1.00)
GFR calc Af Amer: 45 mL/min — ABNORMAL LOW (ref >60)
GFR calc non Af Amer: 39 mL/min — ABNORMAL LOW (ref >60)
Glucose, Bld: 139 mg/dL — ABNORMAL HIGH (ref 70–99)
Potassium: 3.7 mmol/L (ref 3.5–5.1)
Sodium: 146 mmol/L — ABNORMAL HIGH (ref 135–145)

## 2019-10-04 MED ORDER — ENOXAPARIN SODIUM 30 MG/0.3ML ~~LOC~~ SOLN
30.0000 mg | Freq: Every day | SUBCUTANEOUS | Status: DC
  Administered 2019-10-05 – 2019-10-09 (×5): 30 mg via SUBCUTANEOUS
  Filled 2019-10-04 (×5): qty 0.3

## 2019-10-04 NOTE — Plan of Care (Signed)
  Problem: RH SAFETY Goal: RH STG ADHERE TO SAFETY PRECAUTIONS W/ASSISTANCE/DEVICE Description: STG Adhere to Safety Precautions With min Assistance/Device. Outcome: Progressing   Problem: RH COGNITION-NURSING Goal: RH STG ANTICIPATES NEEDS/CALLS FOR ASSIST W/ASSIST/CUES Description: STG Anticipates Needs/Calls for Assist With min Assistance/Cues. Outcome: Progressing   Problem: RH PAIN MANAGEMENT Goal: RH STG PAIN MANAGED AT OR BELOW PT'S PAIN GOAL Description: < 3 out of 10.  Outcome: Progressing

## 2019-10-04 NOTE — Progress Notes (Signed)
Physical Therapy Session Note  Patient Details  Name: April Le MRN: [id number] Date of Birth: 04/10/1941  Today's Date: 10/04/2019 PT Individual Time: 7948-0165 1300-1340 PT Individual Time Calculation (min): 40 min and 40 min   Short Term Goals: Week 3:  PT Short Term Goal 1 (Week 3): STG=LTG due to ELOS  Skilled Therapeutic Interventions/Progress Updates:  Session 1  Pt received supine in bed and agreeable to PT. Supine>sit transfer with supervision assist from slightly elevated position with min cues for scooting technique. Sit<>stand from bed with CGA for safety. Pt noted to have incontinent brief. Ambulatory transfer to toilet with RW and min assist to improve step length on the R and safety over threshold into bathroom. Pt unable to void once on toilet. Pt donned pants sitting on toilet with min assist to thread over feet and pull to waist. Sit<>stand from toilet with grab bar x 3 for pericare and clothing management with CGA. Pt transferred back to [facility] with min assist and RW. Gait training in dday room x 38f with min assist to transfer to nustep. nustep reciprocal training x 5 min +3 min with short therapeutic rest break. Min multimodal cues for full ROM on the RLE.WC mobility x 533fwith min-supervision assist from PT and min-mod cues for improved use of RUE to maintain straight path. [facility] returned to [facility] and [facility] reach and all needs met.    Session 2.  Pt received sitting in WC and agreeable to PT. Pt transported to orthogym [place] with RW x 5090fnd min-mod assist from PT to improve Weight shift and step length on the R. Pt noted to have excessive flexed posture and RW well beyond BOS, with no chage with cues from PT.   Dynavision visual scanning and standing tolerance/balance training. Program A, 1 min 3 rings x 2bouts. Min assist from PT to prevent posterior LOB as well as improve erect posture, terminal knee extension, and  coordination in the RUE to press target button. Pt reports appears extremely fatigued.   Pt returned to room and performed ambulatory transfer to bed with min-mod assist and RW due to impulsivity in transfer. Sit>supine completed with supervision assist for safety and improved positioning once in bed. Pt left supine in bed with call bell in reach and all needs met.         Therapy Documentation Precautions:  Precautions Precautions: Fall Precaution Comments: cortrak Restrictions Weight Bearing Restrictions: No Vital Signs: Therapy Vitals Temp: 98.1 F (36.7 C) Pulse Rate: 69 Resp: 18 BP: 134/64 Patient Position (if appropriate): Lying Oxygen Therapy SpO2: 95 % O2 Device: Room Air Pain: Faces: none.    Therapy/Group: Individual Therapy  AusLorie Phenix/20/2020, 8:50 AM

## 2019-10-04 NOTE — Progress Notes (Signed)
Occupational Therapy Session Note  Patient Details  Name: April Le MRN: [id number] Date of Birth: June 01, 1941  Today's Date: 10/04/2019 OT Individual Time: 5027-7412 OT Individual Time Calculation (min): 56 min   Short Term Goals: Week 2:  OT Short Term Goal 1 (Week 2): Pt will perform bathing tasks with mod A overall. OT Short Term Goal 1 - Progress (Week 2): Met OT Short Term Goal 2 (Week 2): Pt will perform LB dressing with mod A overall. OT Short Term Goal 2 - Progress (Week 2): Met OT Short Term Goal 3 (Week 2): Pt will perform grooming tasks at sink with min A overall. OT Short Term Goal 3 - Progress (Week 2): Not met OT Short Term Goal 4 (Week 2): Pt will perform toileting with min A overall.  Skilled Therapeutic Interventions/Progress Updates:    Pt greeted in bed with spouse present. Pt very lethargic and needing assist to initiate supine<sit. When presented with RW she pushed it out of the way and initiated squat pivot<w/c which she completed with Mod A. Attempted for pt to complete oral care/grooming tasks while standing however pt shook her head and kept trying to pull w/c closer to sink. Therefore worked on Lexicographer while participating in stated tasks w/c level. Max A and HOH for completing oral care and hand hygiene. When handed with nectar thickened beverage pt did initiate bringing beverage to mouth and leaning forward to rinse out mouth in sink. She brought her toothbrush towards her hair several times and also began chewing one of the rinsing cups. After oral care, she drank 6 sips of water via straw. Results recorded on her water protocol sheet. Mod A for squat pivot<toilet for timed toileting. Pt would not stand during session, instead completed partial stands/squats. She assisted OT with lowering clothing and also elevating clothing after she sat for a few minutes on the toilet. Pts brief was dry. After a few minutes of sitting she was unable to void. She returned to  the w/c and then transferred back to bed. Pt left with all needs within reach and bed alarm set.   Therapy Documentation Precautions:  Precautions Precautions: Fall Precaution Comments: cortrak Restrictions Weight Bearing Restrictions: No Vital Signs: Therapy Vitals Temp: 98.1 F (36.7 C) Temp Source: Oral Pulse Rate: 70 Resp: 17 BP: 114/60 Patient Position (if appropriate): Lying Oxygen Therapy SpO2: 95 % O2 Device: Room Air Pain: No s/s pain during session  ADL:       Therapy/Group: Individual Therapy  Madhuri Vacca A Krissie Merrick 10/04/2019, 4:05 PM

## 2019-10-04 NOTE — Progress Notes (Signed)
Modified Barium Swallow Progress Note  Patient Details  Name: April Le MRN: [id number] Date of Birth: 09-27-1941  Today's Date: 10/04/2019  Modified Barium Swallow completed.  Full report located under Chart Review in the Imaging Section.  Brief recommendations include the following:  Clinical Impression    Pt continues to present with high risk of aspiration when consuming thin liquids. Pt presents with deficits in boluses presentation/administration d/t limb apraxia as well as deficits in oral and pharyngeal phase of swallow that result in deep penetration and aspiraiton of thin liquids. 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 To compensate, straw was given but also resulted in more of the bolus being aspirated. Pt's orla phase is also impacted by apraxia and presents iwth oral holding and diffuclty with bolus cohesion and complete oral clearing. This results with severe premature spillage, multiple swallows and pharyngeal residue with aspiration after the swallow of oral residuals. Pt continues to exhibit delayed swallow at pyriform sinuses with aspiration before and during the swallow. Pt's safest diet continues to be dysphagia 3 with nectar thick liquids, medicine whole with nectar thick liquids and water protocol via tsp.    Swallow Evaluation Recommendations       SLP Diet Recommendations: Dysphagia 3 (Mech soft) solids;Nectar thick liquid   Liquid Administration via: Cup   Medication Administration: Whole meds with liquid   Supervision: Full supervision/cueing for compensatory strategies   Compensations: Minimize environmental distractions;Slow rate;Small sips/bites   Postural Changes: Seated upright at 90 degrees   Oral Care Recommendations: Oral care BID   Other Recommendations: Order thickener from pharmacy;Remove water pitcher;Have oral  suction available;Prohibited food (jello, ice cream, thin soups)    Natascha Edmonds 10/04/2019,1:09 PM

## 2019-10-04 NOTE — Progress Notes (Signed)
[name] PHYSICAL MEDICINE & REHABILITATION PROGRESS NOTE   Subjective/Complaints:  No issues overnite    ROS- limited by cognition. April Le   Objective:   No results found. No results for input(s): WBC, HGB, HCT, PLT in the last 72 hours. Recent Labs    10/02/19 0503 10/04/19 0637  NA 147* 146*  K 3.3* 3.7  CL 106 110  CO2 27 24  GLUCOSE 124* 139*  BUN 23 29*  CREATININE 1.13* 1.30*  CALCIUM 9.2 9.3    Intake/Output Summary (Last 24 hours) at 10/04/2019 0742 Last data filed at 10/03/2019 1800 Gross per 24 hour  Intake 480 ml  Output -  Net 480 ml     Physical Exam: Vital Signs Blood pressure 134/64, pulse 69, temperature 98.1 F (36.7 C), resp. rate 18, height 5' (1.524 m), weight 66.1 kg, SpO2 95 %. Constitutional: No distress . Vital signs reviewed. HENT: Normocephalic.  Atraumatic. Eyes: EOMI. No discharge. Cardiovascular: No JVD. Respiratory: Normal effort.  No stridor. GI: Non-distended. Skin: Warm and dry.  Intact. Psych: Normal mood.  Normal behavior. Musc:no pain with UE LE ROM, no evidence of edema  Neurologic: Alert., severe aphasia, cannot state name, can close or open eyes to command but unable to follow other commands involving movement of limbs  Global aphasia exp>recept Motor: Limited due to participation, but appears to be moving all extremities spontaneously, unchanged  Assessment/Plan: 1. 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  Physiatrist is providing close team supervision and 24 hour management of active medical problems listed below.  Physiatrist and rehab team continue to assess barriers to discharge/monitor patient progress toward functional and medical goals  Care Tool:  Bathing  Bathing activity did not occur: Refused Body parts bathed by patient: Right arm, Left arm, Chest, Abdomen, Face   Body parts  bathed by helper: Right arm, Left arm, Chest, Abdomen, Buttocks, Right upper leg, Left upper leg, Right lower leg, Left lower leg     Bathing assist Assist Level: Maximal Assistance - Patient 24 - 49%     Upper Body Dressing/Undressing Upper body dressing Upper body dressing/undressing activity did not occur (including orthotics): Refused What is the patient wearing?: Pull over shirt    Upper body assist Assist Level: Minimal Assistance - Patient > 75%    Lower Body Dressing/Undressing Lower body dressing    Lower body dressing activity did not occur: Refused What is the patient wearing?: Pants, Incontinence brief     Lower body assist Assist for lower body dressing: Moderate Assistance - Patient 50 - 74%     Toileting Toileting Toileting Activity did not occur Landscape architect and hygiene only): Refused  Toileting assist Assist for toileting: Maximal Assistance - Patient 25 - 49%     Transfers Chair/bed transfer  Transfers assist  Chair/bed transfer activity did not occur: Safety/medical concerns  Chair/bed transfer assist level: Minimal Assistance - Patient > 75%     Locomotion Ambulation   Ambulation assist   Ambulation activity did not occur: Safety/medical concerns  Assist level: Minimal Assistance - Patient > 75% Assistive device: Walker-rolling Max distance: 95   Walk 10 feet activity   Assist  Walk 10 feet activity did not occur: Safety/medical concerns  Assist level: Minimal Assistance - Patient > 75% Assistive device: Walker-rolling   Walk 50 feet activity   Assist Walk 50 feet with 2 turns activity did not occur:  Safety/medical concerns  Assist level: Minimal Assistance - Patient > 75% Assistive device: Walker-rolling    Walk 150 feet activity   Assist Walk 150 feet activity did not occur: Safety/medical concerns         Walk 10 feet on uneven surface  activity   Assist Walk 10 feet on uneven surfaces activity did not  occur: Safety/medical concerns         Wheelchair     Assist Will patient use wheelchair at discharge?: Yes   Wheelchair activity did not occur: Safety/medical concerns  Wheelchair assist level: Minimal Assistance - Patient > 75% Max wheelchair distance: 50    Wheelchair 50 feet with 2 turns activity    Assist    Wheelchair 50 feet with 2 turns activity did not occur: Safety/medical concerns   Assist Level: Minimal Assistance - Patient > 75%   Wheelchair 150 feet activity     Assist  Wheelchair 150 feet activity did not occur: Safety/medical concerns       Blood pressure 134/64, pulse 69, temperature 98.1 F (36.7 C), resp. rate 18, height 5' (1.524 m), weight 66.1 kg, SpO2 95 %.  Medical Problem List and Plan: 1.  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  Status post loop recorder placement  Cont CIR PT, OT,  SLP 2.  Antithrombotics:  -DVT/anticoagulation: Lovenox.             -antiplatelet therapy: Aspirin 325 mg daily, Plavix 75 mg daily- 3. Pain Management: Tylenol as needed 4. Mood: Aricept 5 mg daily, Seroquel 25 mg nightly             -antipsychotic agents: seroquel QHS for sundowning, increased to 50mg  on 11/13 5. Neuropsych: This patient is not capable of making decisions on her own behalf. 6. Skin/Wound Care: Routine skin checks 7. Fluids/Electrolytes/Nutrition: Routine in and outs 8.  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  Monitor for any signs of fluid overload.  Lasix 40 mg twice daily Filed Weights   09/28/19 0514 09/29/19 0503 10/03/19 0535  Weight: 68 kg 67.5 kg 66.1 kg   Stable on 11/17 10.  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 ?AKI versus CKD stage III.    Creatinine 1.32 on  11/13, Creat 1.01 today improved  Encourage fluids, however may be difficult to maintain given nectar liquids DC  IVF  11.  Hypertension.  Lopressor 50 mg twice daily.  Monitor with increased mobility Vitals:   10/03/19 1920 10/04/19 0518  BP: 137/64 134/64  Pulse: 74 69  Resp: 18 18  Temp: 98.4 F (36.9 C) 98.1 F (36.7 C)  SpO2: 93% 95%  normal BP today  12.  Hypothyroidism.  Synthroid 13.  Hyperlipidemia.  Lipitor 14.  Obesity: BMI 29.7 15. Post stroke seizure: complex partial   Continue Keppra 500mg  BID po as per Neuro, neuro has signed off but they will need to see her as an outpatient  No reported seizures  16.  OSA CPAP with oxygen at home. 17. Bradycardia:  Due to BB- stable  18.  Acute blood loss anemia  Hemoglobin 11.2 on 11/6, labs ordered for tomorrow 19.  HypoK due to loop diuretic, start [place] 56meq 11/18 , repeat BMET K+ 3.7 today  LOS:17 days A FACE TO FACE EVALUATION  WAS PERFORMED  Charlett Blake 10/04/2019, 7:42 AM

## 2019-10-04 NOTE — Progress Notes (Signed)
Speech Language Pathology Weekly Progress and Session Note  Patient Details  Name: April Le MRN: [id number] Date of Birth: 1941/07/12  Beginning of progress report period: September 27, 2019 End of progress report period: October 04, 2019  Today's Date: 10/04/2019 SLP Individual Time: 0945-1000; 1030-1100 SLP Individual Time Calculation (min): 15 min;30 min  Short Term Goals: Week 2: SLP Short Term Goal 1 (Week 2): Pt will consume dysphagia 2 diet with nectar thick liquids with supervision level cues for use of compensatory swallow strategies. SLP Short Term Goal 1 - Progress (Week 2): Met SLP Short Term Goal 2 (Week 2): Pt will consume trials of dysphagia 3 over 3 sessions with minimal overt s/s of aspiration and Min A cues for use of compensatory swallow strategies. SLP Short Term Goal 2 - Progress (Week 2): Met SLP Short Term Goal 3 (Week 2): Pt will consume trials of thin liquids with minimal overt s/s of aspiration over 5 sessions to demonstrate readiness for repeat instrumental swallow study. SLP Short Term Goal 3 - Progress (Week 2): Met SLP Short Term Goal 4 (Week 2): Pt will demonstrate sustained attention to task for 5 minutes with Min A cues. SLP Short Term Goal 4 - Progress (Week 2): Met SLP Short Term Goal 5 (Week 2): Pt will utilize multi-modal communication to express basic wants/needs in 5 out of 10 opportunities and Mod A cues. SLP Short Term Goal 5 - Progress (Week 2): Not met SLP Short Term Goal 6 (Week 2): Pt will utilize word finding strategies to produce phrase length utterances that are ~ 25% intelligibility with Max A cues. SLP Short Term Goal 6 - Progress (Week 2): Not met    New Short Term Goals: Week 3: SLP Short Term Goal 1 (Week 3): STGs=LTGs d/t short remaining length of stay  Weekly Progress Updates: Pt has made slow progress this reporting period and as a result, her LTG goals targeting cognitive-linguistic deficits have been downgraded d/t slower  than anticipated progress. Pt with repeat MBS with continued aspiration of thin liquids, therefore continue to recommend dysphagia 3 diet with nectar thick liquids. Education has been ongoing with pt's family. Various modes of communication have been attempted but have not yielded any promising mode of communication. Therefore recommend continued skilled ST at discharge with repeat instrumental study as Outpatient.      Intensity: Minumum of 1-2 x/day, 30 to 90 minutes Frequency: 3 to 5 out of 7 days Duration/Length of Stay: 10/09/19 Treatment/Interventions: Cognitive remediation/compensation;Cueing hierarchy;Dysphagia/aspiration precaution training;Functional tasks;Internal/external aids;Patient/family education;Speech/Language facilitation   Daily Session  Skilled Therapeutic Interventions:   Skilled treatment session focused on communication. SLP asked to intercept pt as pt was waving her arms trying to communicate "something" to nursing at nursing station. Despite verbal yes/no questions, pt unable to indicate what she was attempting to communicate. This Probation officer has some knowledge of pt from previous sessions and SLP offered pt her wheelchair. Pt's smile indicated that she wanted to get out of bed and into wheelchair, SLP transferred pt. Pt then used hand waving motion which I interpreted as wanting a blanket, pt smiled, blanket provided. SLP placed safety belt on and pt left with all needs within reach. Overall, pt's attempts at gestural communication was ineffective.   Skilled treatment session focused on providing education to pt's husband on results of MBS and current recommendations. This Probation officer provided synopsis of study with the results revealing high risk of aspirating thin liquids, current recommendation would be for pt to discharge on  dysphagia 3 with nectar thick liquids. Although he expressed understanding her husband asked that I contact his daughter as the daughter "keeps up with  everything."    This Probation officer contacted pt's daughter April Le) and shared results, recommendations and sent her an email with screenshots of how to order pre-thickened liquids. Daughter available Monday for caregiver education, pt's CSW made aware.  - 20 minutes of non-billable time  General    Pain    Therapy/Group: Individual Therapy  Zailynn Brandel 10/04/2019, 2:38 PM

## 2019-10-04 NOTE — Progress Notes (Signed)
Social Work Patient ID: April Le, female   DOB: 05-04-1941, 78 y.o.   MRN: [id number]  Spoke with daughter to arrange family education have scheduled for Monday @ 9:00-12:00. Both husband and daughter will be here Monday for family training.

## 2019-10-05 ENCOUNTER — Inpatient Hospital Stay (HOSPITAL_COMMUNITY): Payer: Medicare Other | Admitting: Speech Pathology

## 2019-10-05 ENCOUNTER — Inpatient Hospital Stay (HOSPITAL_COMMUNITY): Payer: Medicare Other | Admitting: Physical Therapy

## 2019-10-05 NOTE — Plan of Care (Signed)
  Problem: Consults Goal: RH STROKE PATIENT EDUCATION Description: See Patient Education module for education specifics  Outcome: Progressing   Problem: RH BOWEL ELIMINATION Goal: RH STG MANAGE BOWEL WITH ASSISTANCE Description: STG Manage Bowel with [place]. Outcome: Progressing Goal: RH STG MANAGE BOWEL W/MEDICATION W/ASSISTANCE Description: STG Manage Bowel with Medication with min Assistance. Outcome: Progressing   Problem: RH BLADDER ELIMINATION Goal: RH STG MANAGE BLADDER WITH ASSISTANCE Description: STG Manage Bladder With min Assistance Outcome: Progressing   Problem: RH SAFETY Goal: RH STG ADHERE TO SAFETY PRECAUTIONS W/ASSISTANCE/DEVICE Description: STG Adhere to Safety Precautions With min Assistance/Device. Outcome: Progressing   Problem: RH COGNITION-NURSING Goal: RH STG ANTICIPATES NEEDS/CALLS FOR ASSIST W/ASSIST/CUES Description: STG Anticipates Needs/Calls for Assist With min Assistance/Cues. Outcome: Progressing   Problem: RH PAIN MANAGEMENT Goal: RH STG PAIN MANAGED AT OR BELOW PT'S PAIN GOAL Description: < 3 out of 10.  Outcome: Progressing

## 2019-10-05 NOTE — Progress Notes (Signed)
Speech Language Pathology Daily Session Note  Patient Details  Name: April Le MRN: [id number] Date of Birth: [birth year]  Today's Date: 10/05/2019 SLP Individual Time: [id number] SLP Individual Time Calculation (min): 60 min  Short Term Goals: Week 3: SLP Short Term Goal 1 (Week 3): STGs=LTGs d/t short remaining length of stay  Skilled Therapeutic Interventions: Patient received skilled SLP services targeting dysphagia and communication goals. Patient completed PO trials of thin liquids following oral care. Mild anterior loss was observed. No overt s/sx of aspiration were noted with trials of thin liquids x5. Daughter [name] present at bedside had questions regarding foods to avoid while on thickened liquids. Educated daughter on avoiding items such as ice creams, jello, and thin soups. Patient was unable to complete automatic speech task counting 1-10 in unison with therapist. When provided max cues patient was able to name family members in pictures in a field of 2 with 25% accuracy. Patient was able to imitate gestures for object functions in 4 out of 10 opportunities. Patient was perseverating on the phrase "going home" throughout session. With carrier phrases patient identified common objects in 1 out of 5 opportunities. At the end of therapy session patient was upright in bed with daughter present.  Pain Pain Assessment Faces Pain Scale: No hurt  Therapy/Group: Individual Therapy  Cristy Folks 10/05/2019, 11:49 AM

## 2019-10-05 NOTE — Progress Notes (Signed)
Physical Therapy Session Note  Patient Details  Name: April Le MRN: [id number] Date of Birth: 02-Sep-1941  Today's Date: 10/05/2019 PT Individual Time: 0905-1000 PT Individual Time Calculation (min): 55 min   Short Term Goals: Week 3:  PT Short Term Goal 1 (Week 3): STG=LTG due to ELOS  Skilled Therapeutic Interventions/Progress Updates:   Pt received supine in bed and agreeable to PT. Supine>sit transfer with supervision assist from semi reclined position and min cues for scooting technique to EOB. Pt able to assist with donning pants with min assist and set up from PT.  Sit<>stand from EOB with supervision assist and RW. Pt able to pull pants to waist on this day with only min assist for balance. Stand pivot transfer to [facility] on the R with min assist for AD management in turn.   Pt transported to rehab gym in [facility]. Variable gait training in parallel bars with CGA for safety, forward, backward 2 x 8 ft. PT attempted to have pt perform additional bout for forward/reverse as well as side stepping, but pt unable to motor plan sit<>stand with rail support of parallel bars.   Gait training with RW and min assist x 59f and 648f Min cues for safety in turns and AD management. Pt noted to have improved posture, AD management and step length compared to previous session.   UBE x 1 min and 2 x 3 min with CGA to maintain anterior weight shift and multimodal cues for improve and symmetry of movement for R and LUE.   Pt returned to room and performed stand pivot transfer to bed with CGA and R UE supported on bed rail. Sit>supine completed with supervision assist with min cues for awareness of RUE stuck on rail. Pt left supine in bed with call bell in reach and all needs met.        Therapy Documentation Precautions:  Precautions Precautions: Fall Precaution Comments: cortrak Restrictions Weight Bearing Restrictions: No Pain: Denies    Therapy/Group: Individual Therapy  AuLorie Phenix1/21/2020, 10:04 AM

## 2019-10-05 NOTE — Progress Notes (Addendum)
[facility] PHYSICAL MEDICINE & REHABILITATION PROGRESS NOTE   Subjective/Complaints:  No problems reported by nursing. Appears comfortable in bed  ROS: limited due to language/communication   Objective:   Dg Swallowing Func-speech Pathology  Result Date: 10/04/2019 Objective Swallowing Evaluation: Type of Study: MBS-Modified Barium Swallow Study  Patient Details Name: Tsion Realmuto MRN: [id number] Date of Birth: 06-24-1941 Today's Date: 10/04/2019 Time: SLP Start Time (ACUTE ONLY): [id number] -SLP Stop Time (ACUTE ONLY): 1300 SLP Time Calculation (min) (ACUTE ONLY): 25 min Past Medical History: Past Medical History: Diagnosis Date . Abdominal distention   past two months . Abnormality of gait 10/28/2015 . 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 Arthritis   right knee . Benign essential HTN 11/19/2013 . CAP (community acquired pneumonia) 09/07/2013 . CHF (congestive heart failure) ([place])  . Chronic diastolic heart failure ([place]) 11/19/2013 . Chronic kidney disease   STAGE III KIDNEY DISEASE-PER PT--PT SEES DR. Risa Grill UROLOGIST . Complication of anesthesia   PT REMEMBERS BREATHING PROBLEMS WAKING UP FROM KNEE SURGERY AT [facility] 2011 OR 2012 . Coronary artery calcification seen on CT scan 01/10/2017 . Fever of unknown origin 09/04/2013 . GERD (gastroesophageal reflux disease)  . Headache  . Headache(784.0)  . 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 Hypercholesteremia  . Hypertension  . Hypertensive heart disease with heart failure ([place]) 01/10/2017 . Hypothyroidism  . Hypoxia 09/04/2013 . Leukocytosis 09/04/2013 . Mass of ovary 10/26/2011 . OSA (obstructive sleep apnea) 11/19/2013 . Pneumonia  . Shortness of breath   WITH EXERTION . Sinus drainage   PT STARTED ANTIBIOTIC 11/17/11 --IS HOARSE TODAY, SOME WHEEZING AND COUGH WITH YELLOW DRAINAGE . SIRS (systemic inflammatory response syndrome) ([place]) 09/04/2013 . SOB (shortness of breath)  09/04/2013 . Stroke ([place]) 02/2007, 08/2009  2 total -RESIDUAL WEAKNESS LEFT LEG--AND APHASIA . Stroke ([place]) 09/01/2017 . Stroke ([place]) 09/08/2017 . Tumor of ovary  . Urinary incontinence  . Urinary incontinence  . Weakness of left leg   Mildly, post stroke Past Surgical History: Past Surgical History: Procedure Laterality Date . ABDOMINAL HYSTERECTOMY   . CHOLECYSTECTOMY   . IR CT HEAD LTD  09/11/2019 . IR PERCUTANEOUS ART THROMBECTOMY/INFUSION INTRACRANIAL INC DIAG ANGIO  09/11/2019 . LAPAROTOMY  11/22/2011  Procedure: EXPLORATORY LAPAROTOMY;  Surgeon: Imagene Gurney A. Alycia Rossetti, MD;  Location: [facility];  Service: Gynecology;  Laterality: N/A; . LOOP RECORDER INSERTION N/A 09/16/2019  Procedure: LOOP RECORDER INSERTION;  Surgeon: Evans Lance, MD;  Location: [facility];  Service: Cardiovascular;  Laterality: N/A; . MENISCUS REPAIR   . RADIOLOGY WITH ANESTHESIA N/A 09/11/2019  Procedure: IR WITH ANESTHESIA;  Surgeon: Radiologist, Medication, MD;  Location: [facility];  Service: Radiology;  Laterality: N/A; . SALPINGOOPHORECTOMY  11/22/2011  Procedure: SALPINGO OOPHERECTOMY;  Surgeon: Imagene Gurney A. Alycia Rossetti, MD;  Location: [facility];  Service: Gynecology;  Laterality: Bilateral; . TOE SURGERY   . TONSILLECTOMY   . TUBAL LIGATION   HPI: Karianne Tollefsen is a 78 y.o. female with past medical history significant for congestive heart failure(chronic diastolic heart failure), prior stroke, hypertension, hyperlipidemia and prior stroke with residual mild left leg weakness and mild aphasia. She presented to the emergency department as a code stroke for left gaze deviation, right-sided weakness and aphasia on 09/11/19 when she was found non-verbal with right side weakness and facial droop. Stat CT head was obtained which showed no acute hemorrhage/infarct.  CT head did demonstrate old infarcts most notably left  MCA stroke and R PCA stroke.  CT angiogram and CT perfusion showed left M1 occlusion. Intubated briefly for arteriogram and TICI  revascularization.   No data recorded Assessment / Plan / Recommendation CHL IP CLINICAL IMPRESSIONS 10/04/2019 Clinical Impression Pt continues to present with high risk of aspiration when consuming thin liquids. Pt presents with deficits in boluses presentation/administration d/t limb apraxia as well as deficits in oral and pharyngeal phase of swallow that result in deep penetration and aspiraiton of thin liquids. 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 To compensate, straw was given but also resulted in more of the bolus being aspirated. Pt's orla phase is also impacted by apraxia and presents iwth oral holding and diffuclty with bolus cohesion and complete oral clearing. This results with severe premature spillage, multiple swallows and pharyngeal residue with aspiration after the swallow of oral residuals. Pt continues to exhibit delayed swallow at pyriform sinuses with aspiration before and during the swallow. Pt's safest diet continues to be dysphagia 3 with nectar thick liquids, medicine whole with nectar thick liquids and water protocol via tsp. SLP Visit Diagnosis Dysphagia, oropharyngeal phase (R13.12) Attention and concentration deficit following -- Frontal lobe and executive function deficit following -- Impact on safety and function Mild aspiration risk   CHL IP TREATMENT RECOMMENDATION 10/04/2019 Treatment Recommendations Therapy as outlined in treatment plan below   Prognosis 09/12/2019 Prognosis for Safe Diet Advancement Good Barriers to Reach Goals Time post onset Barriers/Prognosis Comment -- CHL IP DIET RECOMMENDATION 10/04/2019 SLP Diet Recommendations Dysphagia 3 (Mech soft) solids;Nectar thick liquid Liquid Administration via Cup Medication Administration Whole meds with liquid Compensations Minimize environmental distractions;Slow rate;Small sips/bites Postural  Changes Seated upright at 90 degrees   CHL IP OTHER RECOMMENDATIONS 10/04/2019 Recommended Consults -- Oral Care Recommendations Oral care BID Other Recommendations Order thickener from pharmacy;Remove water pitcher;Have oral suction available;Prohibited food (jello, ice cream, thin soups)   CHL IP FOLLOW UP RECOMMENDATIONS 09/16/2019 Follow up Recommendations Inpatient Rehab   CHL IP FREQUENCY AND DURATION 09/12/2019 Speech Therapy Frequency (ACUTE ONLY) min 2x/week Treatment Duration 2 weeks      CHL IP ORAL PHASE 10/04/2019 Oral Phase Impaired Oral - Pudding Teaspoon -- Oral - Pudding Cup -- Oral - Honey Teaspoon -- Oral - Honey Cup -- Oral - Nectar Teaspoon NT Oral - Nectar Cup Weak lingual manipulation;Holding of bolus Oral - Nectar Straw -- Oral - Thin Teaspoon Weak lingual manipulation;Holding of bolus;Piecemeal swallowing;Delayed oral transit;Decreased bolus cohesion;Premature spillage;Reduced posterior propulsion Oral - Thin Cup Weak lingual manipulation;Reduced posterior propulsion;Holding of bolus;Piecemeal swallowing;Delayed oral transit;Decreased bolus cohesion;Premature spillage Oral - Thin Straw Weak lingual manipulation;Reduced posterior propulsion;Holding of bolus;Piecemeal swallowing;Delayed oral transit;Decreased bolus cohesion;Premature spillage Oral - Puree NT Oral - Mech Soft NT Oral - Regular -- Oral - Multi-Consistency -- Oral - Pill -- Oral Phase - Comment --  CHL IP PHARYNGEAL PHASE 10/04/2019 Pharyngeal Phase Impaired Pharyngeal- Pudding Teaspoon -- Pharyngeal -- Pharyngeal- Pudding Cup -- Pharyngeal -- Pharyngeal- Honey Teaspoon -- Pharyngeal -- Pharyngeal- Honey Cup -- Pharyngeal -- Pharyngeal- Nectar Teaspoon NT Pharyngeal -- Pharyngeal- Nectar Cup Delayed swallow initiation-vallecula Pharyngeal -- Pharyngeal- Nectar Straw -- Pharyngeal -- Pharyngeal- Thin Teaspoon Delayed swallow initiation-pyriform sinuses;Penetration/Aspiration before swallow;Penetration/Aspiration during  swallow;Penetration/Apiration after swallow;Moderate aspiration;Pharyngeal residue - valleculae;Pharyngeal residue - pyriform Pharyngeal Material enters airway, passes BELOW cords and not ejected out despite cough attempt by patient;Material enters airway, passes  BELOW cords without attempt by patient to eject out (silent aspiration);Material enters airway, remains ABOVE vocal cords then ejected out;Material enters airway, passes BELOW cords then ejected out;Material enters airway, remains ABOVE vocal cords and not ejected out Pharyngeal- Thin Cup Delayed swallow initiation-pyriform sinuses;Penetration/Aspiration before swallow;Penetration/Aspiration during swallow;Penetration/Apiration after swallow;Moderate aspiration;Pharyngeal residue - valleculae;Pharyngeal residue - pyriform Pharyngeal Material enters airway, passes BELOW cords without attempt by patient to eject out (silent aspiration);Material enters airway, passes BELOW cords and not ejected out despite cough attempt by patient;Material enters airway, remains ABOVE vocal cords then ejected out;Material enters airway, passes BELOW cords then ejected out;Material enters airway, remains ABOVE vocal cords and not ejected out Pharyngeal- Thin Straw Delayed swallow initiation-pyriform sinuses;Penetration/Aspiration before swallow;Penetration/Aspiration during swallow;Penetration/Apiration after swallow;Moderate aspiration;Pharyngeal residue - valleculae;Pharyngeal residue - pyriform Pharyngeal Material enters airway, passes BELOW cords and not ejected out despite cough attempt by patient Pharyngeal- Puree NT Pharyngeal -- Pharyngeal- Mechanical Soft NT Pharyngeal -- Pharyngeal- Regular -- Pharyngeal -- Pharyngeal- Multi-consistency -- Pharyngeal -- Pharyngeal- Pill -- Pharyngeal -- Pharyngeal Comment --  CHL IP CERVICAL ESOPHAGEAL PHASE 10/04/2019 Cervical Esophageal Phase WFL Pudding Teaspoon -- Pudding Cup -- Honey Teaspoon -- Honey Cup -- Nectar Teaspoon --  Nectar Cup -- Nectar Straw -- Thin Teaspoon -- Thin Cup -- Thin Straw -- Puree -- Mechanical Soft -- Regular -- Multi-consistency -- Pill -- Cervical Esophageal Comment -- Happi Overton 10/04/2019, 2:34 PM              No results for input(s): WBC, HGB, HCT, PLT in the last 72 hours. Recent Labs    10/04/19 0637  NA 146*  K 3.7  CL 110  CO2 24  GLUCOSE 139*  BUN 29*  CREATININE 1.30*  CALCIUM 9.3    Intake/Output Summary (Last 24 hours) at 10/05/2019 1101 Last data filed at 10/05/2019 0811 Gross per 24 hour  Intake 360 ml  Output -  Net 360 ml     Physical Exam: Vital Signs Blood pressure (!) 140/91, pulse 79, temperature 98.6 F (37 C), temperature source Oral, resp. rate 18, height 5' (1.524 m), weight 65.6 kg, SpO2 91 %. Constitutional: No distress . Vital signs reviewed. HEENT: EOMI, oral membranes moist Neck: supple Cardiovascular: RRR without murmur. No JVD    Respiratory: CTA Bilaterally without wheezes or rales. Normal effort    GI: BS +, non-tender, non-distended  Skin: Warm and dry.  Intact. Psych: Normal mood.  Normal behavior. Musc:no pain with UE LE ROM, no evidence of edema  Neurologic: Alert., expressive>receptive aphasia. Occasionally mumbles, did not consistently follow simple commands Motor: Limited due to participation, but appears to be moving all extremities spontaneously, unchanged  Assessment/Plan: 1. 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  Physiatrist is providing close team supervision and 24 hour management of active medical problems listed below.  Physiatrist and rehab team continue to assess barriers to discharge/monitor patient progress toward functional and medical goals  Care Tool:  Bathing  Bathing activity did not occur: Refused Body parts bathed by patient: Right arm, Left arm, Chest, Abdomen, Face   Body parts  bathed by helper: Right arm, Left arm, Chest, Abdomen, Buttocks, Right upper leg, Left upper leg, Right lower leg, Left lower leg     Bathing assist Assist Level: Maximal Assistance - Patient 24 - 49%     Upper Body Dressing/Undressing Upper body dressing Upper body dressing/undressing activity did not  occur (including orthotics): Refused What is the patient wearing?: Pull over shirt    Upper body assist Assist Level: Minimal Assistance - Patient > 75%    Lower Body Dressing/Undressing Lower body dressing    Lower body dressing activity did not occur: Refused What is the patient wearing?: Pants, Incontinence brief     Lower body assist Assist for lower body dressing: Moderate Assistance - Patient 50 - 74%     Toileting Toileting Toileting Activity did not occur Landscape architect and hygiene only): Refused  Toileting assist Assist for toileting: Maximal Assistance - Patient 25 - 49%     Transfers Chair/bed transfer  Transfers assist  Chair/bed transfer activity did not occur: Safety/medical concerns  Chair/bed transfer assist level: Minimal Assistance - Patient > 75%     Locomotion Ambulation   Ambulation assist   Ambulation activity did not occur: Safety/medical concerns  Assist level: Minimal Assistance - Patient > 75% Assistive device: Walker-rolling Max distance: 60   Walk 10 feet activity   Assist  Walk 10 feet activity did not occur: Safety/medical concerns  Assist level: Minimal Assistance - Patient > 75% Assistive device: Walker-rolling   Walk 50 feet activity   Assist Walk 50 feet with 2 turns activity did not occur: Safety/medical concerns  Assist level: Minimal Assistance - Patient > 75% Assistive device: Walker-rolling    Walk 150 feet activity   Assist Walk 150 feet activity did not occur: Safety/medical concerns         Walk 10 feet on uneven surface  activity   Assist Walk 10 feet on uneven surfaces activity did not  occur: Safety/medical concerns         Wheelchair     Assist Will patient use wheelchair at discharge?: Yes   Wheelchair activity did not occur: Safety/medical concerns  Wheelchair assist level: Supervision/Verbal cueing Max wheelchair distance: 50    Wheelchair 50 feet with 2 turns activity    Assist    Wheelchair 50 feet with 2 turns activity did not occur: Safety/medical concerns   Assist Level: Supervision/Verbal cueing   Wheelchair 150 feet activity     Assist  Wheelchair 150 feet activity did not occur: Safety/medical concerns       Blood pressure (!) 140/91, pulse 79, temperature 98.6 F (37 C), temperature source Oral, resp. rate 18, height 5' (1.524 m), weight 65.6 kg, SpO2 91 %.  Medical Problem List and Plan: 1.  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  Status post loop recorder placement  Cont CIR PT, OT,  SLP 2.  Antithrombotics:  -DVT/anticoagulation: Lovenox.             -antiplatelet therapy: Aspirin 325 mg daily, Plavix 75 mg daily- 3. Pain Management: Tylenol as needed 4. Mood: Aricept 5 mg daily, Seroquel 25 mg nightly             -antipsychotic agents: seroquel QHS for sundowning, increased to 50mg  on 11/13 5. Neuropsych: This patient is not capable of making decisions on her own behalf. 6. Skin/Wound Care: Routine skin checks 7. Fluids/Electrolytes/Nutrition: Routine in and outs 8.  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  Monitor for any signs of fluid overload.  Lasix 40 mg twice daily Filed Weights   09/29/19 0503 10/03/19 0535 10/05/19 0507  Weight: 67.5 kg  66.1 kg 65.6 kg   Stable on 11/21 10.  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 ?AKI versus CKD stage III.    Creatinine  1.30 on 11/20   Encourage fluids, however may be difficult to maintain given nectar liquids    Labs on Monday 11.  Hypertension.  Lopressor 50 mg twice daily.  Monitor with increased mobility Vitals:   10/04/19 2041 10/05/19 0513  BP: (!) 129/59 (!) 140/91  Pulse: 81 79  Resp: 16 18  Temp: 98.8 F (37.1 C) 98.6 F (37 C)  SpO2: 93% 91%  fair control 11/21---continue to observe 12.  Hypothyroidism.  Synthroid 13.  Hyperlipidemia.  Lipitor 14.  Obesity: BMI 29.7 15. Post stroke seizure: complex partial   Continue Keppra 500mg  BID po as per Neuro, neuro has signed off but they will need to see her as an outpatient  No reported seizures  16.  OSA CPAP with oxygen at home. 17. Bradycardia:  Due to BB- stable  18.  Acute blood loss anemia  Hemoglobin 11.2 on 11/6, labs ordered for tomorrow 19.  HypoK due to loop diuretic, start [place] 37meq 11/18 , repeat BMET K+ 3.7 11/21  -recheck monday   LOS:18 days A FACE TO FACE EVALUATION WAS PERFORMED  Meredith Staggers 10/05/2019, 11:01 AM

## 2019-10-06 ENCOUNTER — Inpatient Hospital Stay (HOSPITAL_COMMUNITY): Payer: Medicare Other | Admitting: Occupational Therapy

## 2019-10-06 ENCOUNTER — Inpatient Hospital Stay (HOSPITAL_COMMUNITY): Payer: Medicare Other

## 2019-10-06 MED ORDER — RESOURCE THICKENUP CLEAR PO POWD
ORAL | Status: DC | PRN
  Filled 2019-10-06: qty 125

## 2019-10-06 NOTE — Plan of Care (Signed)
  Problem: Consults Goal: RH STROKE PATIENT EDUCATION Description: See Patient Education module for education specifics  Outcome: Progressing   Problem: RH BOWEL ELIMINATION Goal: RH STG MANAGE BOWEL WITH ASSISTANCE Description: STG Manage Bowel with [place]. Outcome: Progressing Goal: RH STG MANAGE BOWEL W/MEDICATION W/ASSISTANCE Description: STG Manage Bowel with Medication with min Assistance. Outcome: Progressing   Problem: RH BLADDER ELIMINATION Goal: RH STG MANAGE BLADDER WITH ASSISTANCE Description: STG Manage Bladder With min Assistance Outcome: Progressing   Problem: RH SAFETY Goal: RH STG ADHERE TO SAFETY PRECAUTIONS W/ASSISTANCE/DEVICE Description: STG Adhere to Safety Precautions With min Assistance/Device. Outcome: Progressing   Problem: RH COGNITION-NURSING Goal: RH STG ANTICIPATES NEEDS/CALLS FOR ASSIST W/ASSIST/CUES Description: STG Anticipates Needs/Calls for Assist With min Assistance/Cues. Outcome: Progressing   Problem: RH PAIN MANAGEMENT Goal: RH STG PAIN MANAGED AT OR BELOW PT'S PAIN GOAL Description: < 3 out of 10.  Outcome: Progressing

## 2019-10-06 NOTE — Progress Notes (Signed)
One attempt OOB this shift. Telesitter and bed alarms  in use. Incontinent of bladder. Doesn't call when wet, needs anticipated by staff. Expressive aphasia. April Le

## 2019-10-06 NOTE — Progress Notes (Signed)
[facility] PHYSICAL MEDICINE & REHABILITATION PROGRESS NOTE   Subjective/Complaints:  No problems reported by nursing. Appears comfortable in bed. Tries hard to communicate, very difficult due to aphasia. Eventually says "ok, ok."  ROS: limited due to language/communication   Objective:   No results found. No results for input(s): WBC, HGB, HCT, PLT in the last 72 hours. Recent Labs    10/04/19 0637  NA 146*  K 3.7  CL 110  CO2 24  GLUCOSE 139*  BUN 29*  CREATININE 1.30*  CALCIUM 9.3    Intake/Output Summary (Last 24 hours) at 10/06/2019 1053 Last data filed at 10/06/2019 0820 Gross per 24 hour  Intake 960 ml  Output -  Net 960 ml     Physical Exam: Vital Signs Blood pressure (!) 137/94, pulse 67, temperature 97.6 F (36.4 C), temperature source Oral, resp. rate 15, height 5' (1.524 m), weight 66.2 kg, SpO2 95 %. Constitutional: No distress . Vital signs reviewed. HEENT: EOMI, oral membranes moist Neck: supple Cardiovascular: RRR without murmur. No JVD    Respiratory: CTA Bilaterally without wheezes or rales. Normal effort    GI: BS +, non-tender, non-distended  Skin: Warm and dry.  Intact. Psych: Normal mood.  Normal behavior. Musc:no pain with UE LE ROM, no evidence of edema  Neurologic: Alert., expressive>receptive aphasia. Occasionally mumbles, did not consistently follow simple commands Motor: Limited due to participation, but appears to be moving all extremities spontaneously, unchanged  Assessment/Plan: 1. 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  Physiatrist is providing close team supervision and 24 hour management of active medical problems listed below.  Physiatrist and rehab team continue to assess barriers to discharge/monitor patient progress toward functional and medical goals  Care Tool:  Bathing  Bathing activity did not  occur: Refused Body parts bathed by patient: Right arm, Left arm, Chest, Abdomen, Face   Body parts bathed by helper: Right arm, Left arm, Chest, Abdomen, Buttocks, Right upper leg, Left upper leg, Right lower leg, Left lower leg     Bathing assist Assist Level: Maximal Assistance - Patient 24 - 49%     Upper Body Dressing/Undressing Upper body dressing Upper body dressing/undressing activity did not occur (including orthotics): Refused What is the patient wearing?: Pull over shirt    Upper body assist Assist Level: Minimal Assistance - Patient > 75%    Lower Body Dressing/Undressing Lower body dressing    Lower body dressing activity did not occur: Refused What is the patient wearing?: Pants, Incontinence brief     Lower body assist Assist for lower body dressing: Moderate Assistance - Patient 50 - 74%     Toileting Toileting Toileting Activity did not occur Landscape architect and hygiene only): Refused  Toileting assist Assist for toileting: Maximal Assistance - Patient 25 - 49%     Transfers Chair/bed transfer  Transfers assist  Chair/bed transfer activity did not occur: Safety/medical concerns  Chair/bed transfer assist level: Minimal Assistance - Patient > 75%     Locomotion Ambulation   Ambulation assist   Ambulation activity did not occur: Safety/medical concerns  Assist level: Minimal Assistance - Patient > 75% Assistive device: Walker-rolling Max distance: 60   Walk 10 feet activity   Assist  Walk 10 feet activity did not occur: Safety/medical concerns  Assist level: Minimal Assistance - Patient > 75% Assistive device: Walker-rolling   Walk 50 feet activity   Assist  Walk 50 feet with 2 turns activity did not occur: Safety/medical concerns  Assist level: Minimal Assistance - Patient > 75% Assistive device: Walker-rolling    Walk 150 feet activity   Assist Walk 150 feet activity did not occur: Safety/medical concerns         Walk  10 feet on uneven surface  activity   Assist Walk 10 feet on uneven surfaces activity did not occur: Safety/medical concerns         Wheelchair     Assist Will patient use wheelchair at discharge?: Yes   Wheelchair activity did not occur: Safety/medical concerns  Wheelchair assist level: Supervision/Verbal cueing Max wheelchair distance: 50    Wheelchair 50 feet with 2 turns activity    Assist    Wheelchair 50 feet with 2 turns activity did not occur: Safety/medical concerns   Assist Level: Supervision/Verbal cueing   Wheelchair 150 feet activity     Assist  Wheelchair 150 feet activity did not occur: Safety/medical concerns       Blood pressure (!) 137/94, pulse 67, temperature 97.6 F (36.4 C), temperature source Oral, resp. rate 15, height 5' (1.524 m), weight 66.2 kg, SpO2 95 %.  Medical Problem List and Plan: 1.  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  Status post loop recorder placement  Cont CIR PT, OT,  SLP 2.  Antithrombotics:  -DVT/anticoagulation: Lovenox.             -antiplatelet therapy: Aspirin 325 mg daily, Plavix 75 mg daily- 3. Pain Management: Tylenol as needed 4. Mood: Aricept 5 mg daily, Seroquel 25 mg nightly             -antipsychotic agents: seroquel QHS for sundowning, increased to 50mg  on 11/13 5. Neuropsych: This patient is not capable of making decisions on her own behalf. 6. Skin/Wound Care: Routine skin checks 7. Fluids/Electrolytes/Nutrition: Routine in and outs 8.  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  Monitor for any signs of fluid overload.  Lasix 40 mg twice daily Filed Weights   10/03/19 0535 10/05/19 0507 10/06/19 0619  Weight: 66.1 kg 65.6 kg 66.2 kg   Stable on 11/21 10.   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 ?AKI versus CKD stage III.    Creatinine 1.30 on 11/20   Encourage fluids, however may be difficult to maintain given nectar liquids    Labs on Monday 11.  Hypertension.  Lopressor 50 mg twice daily.  Monitor with increased mobility Vitals:   10/05/19 1927 10/06/19 0619  BP: (!) 125/96 (!) 137/94  Pulse: 85 67  Resp: 16 15  Temp: 98 F (36.7 C) 97.6 F (36.4 C)  SpO2:  95%  fair control 11/21---continue to observe 12.  Hypothyroidism.  Synthroid 13.  Hyperlipidemia.  Lipitor 14.  Obesity: BMI 29.7 15. Post stroke seizure: complex partial   Continue Keppra 500mg  BID po as per Neuro, neuro has signed off but they will need to see her as an outpatient  No reported seizures  16.  OSA CPAP with oxygen at home. 17. Bradycardia:  Due to BB- stable  18.  Acute blood loss anemia  Hemoglobin 11.2 on 11/6, labs ordered for tomorrow 19.  HypoK due to loop diuretic, start [place] 84meq 11/18 , repeat BMET K+ 3.7 11/21  -  recheck monday   LOS:19 days A FACE TO FACE EVALUATION WAS PERFORMED  Clide Deutscher Melita Villalona 10/06/2019, 10:53 AM

## 2019-10-06 NOTE — Progress Notes (Signed)
Occupational Therapy Session Note  Patient Details  Name: April Le MRN: [id number] Date of Birth: 1941-08-05  Today's Date: 10/06/2019 OT Individual Time: 1300-1341 OT Individual Time Calculation (min): 41 min   Short Term Goals: Week 3:  OT Short Term Goal 1 (Week 3): STGs=LTGs secondary to upcoming discharge  Skilled Therapeutic Interventions/Progress Updates:    Pt greeted in w/c with spouse [name] present. Spouse confirmed that he would be at CIR a little before 9AM tomorrow for family education. Started session with timed toileting. Ambulatory transfer completed using RW with Mod A. Pt with increased hip and knee flexion and would push the walker far forward. She needed manual assist to steer device into the bathroom and to keep RW closer to her body. Mod A for standing balance while assisting OT during clothing mgt. She had a continent bladder void and required supervision assist for self hygiene. Mod A for elevating pants. Had her complete 3 sit<stands from toilet using RW with manual facilitation to improve upright posture. However when pt stepped forward with device hip and knee flexion increased to the point where ambulating back to her w/c was unsafe. Stand pivot<w/c completed with Min-Mod A. She responded better to facilitation of upright posture when standing at the sink to wash her hands after. Min A for sit<stand. Demonstrational and tactile cuing provided for pt to meet task demands. Encouraged more ambulation in prep for family ed tomorrow. Without demonstrational cuing for standing with device, pt was unable to motor plan power up. Once she stood, pt initiated sitting back down, verbalizing "I'm tired" and shaking her head. Set her up with safety belt and call bell. Spouse present, watching TV right beside her.       Therapy Documentation Precautions:  Precautions Precautions: Fall Precaution Comments: cortrak Restrictions Weight Bearing Restrictions: No Vital  Signs: Therapy Vitals Temp: 98.3 F (36.8 C) Temp Source: Oral Pulse Rate: 70 Resp: 17 BP: 106/69 Patient Position (if appropriate): Lying Oxygen Therapy SpO2: 90 % O2 Device: Room Air Pain: No s/s pain during tx   ADL:       Therapy/Group: Individual Therapy  Gared Gillie A Sherie Dobrowolski 10/06/2019, 4:07 PM

## 2019-10-06 NOTE — Progress Notes (Signed)
Physical Therapy Session Note  Patient Details  Name: April Le MRN: [id number] Date of Birth: December 30, 1940  Today's Date: 10/06/2019 PT Individual Time: 1420-1520 PT Individual Time Calculation (min): 60 min   Short Term Goals: Week 3:  PT Short Term Goal 1 (Week 3): STG=LTG due to ELOS  Skilled Therapeutic Interventions/Progress Updates:    Session focused on cognitive remediation and NMR during functional transfer training (stand pivot), sit <> stands, standing balance, and using Kinetron in seated position from w/c. Pt requires multimodal cues for initiation and sequencing during transfers or activities with extra time provided- apraxia and motor planning impaired which is limiting as well. Pt requires mod to max assist for sit <> stands and stand step transfers throughout session with poor ability to achieve full upright posture. NMR for standing balance and postural control re-training during dual task activity to attempt to sort bean bags by colors on a table top (min assist to maintain dynamic standing balance) but unable to complete activity (sorted none correctly and just perseverated on lining them up. Changed demand to just reach for a bean bag and hand to therapist and pt unable ot complete this task as well. Continued to line them up. Occasionally placed one on the side of the table nearer to the therapist. Reduced demand of task by performing in seated position but still unable to complete accurately). Attempted standing again but unable to sustain position long enough to engage in gait or further balance activity. 2 attempts needed to complete safe transfer back to w/c due to inability to sustain upright posture and just wanting to sit. NMR on Kinetron from seated w/c position x 4 min for reciprocal movement pattern retraining and sustained attention to task. Pt put hands by head with gestures and when asked about dizziness or headache but nods - BP taken (see results below and RN made  aware) in seated and attempted standing position. When attempting to stand again, pt continuing with the motion of her legs from Kinetron (no longer in front of it) despite cues. Relayed session back to husband in room and agreed to return to bed to rest for a little while. Max assist for stand pivot back to bed. Supervision for scooting edge of bed and to return to supine. RN present for [employer]   Therapy Documentation Precautions:  Precautions Precautions: Fall Precaution Comments: cortrak Restrictions Weight Bearing Restrictions: No General:   Vital Signs: Therapy Vitals BP: 105/70 Patient Position (if appropriate): Sitting  Standing about 5 sec - immediately took BP but sat while reading = 99/65 mmHg HR = 76 bpm Pain:  Does not appear to have pain initially but difficulty with communication due to aphasia. As session progressed pt making gestures to her head - unable to discern headache vs dizziness (BP checked and it is lower than it has been running). RN made aware and medication given at end of session.    Therapy/Group: Individual Therapy  Canary Brim Ivory Broad, PT, DPT, CBIS  10/06/2019, 3:23 PM

## 2019-10-06 NOTE — Plan of Care (Signed)
  Problem: RH Balance Goal: LTG Patient will maintain dynamic standing with ADLs (OT) Description: LTG:  Patient will maintain dynamic standing balance with assist during activities of daily living (OT)  Flowsheets (Taken 10/06/2019 1620) LTG: Pt will maintain dynamic standing balance during ADLs with: Moderate Assistance - Patient 50 - 74% Note: Downgraded due to slow progress   Problem: RH Bathing Goal: LTG Patient will bathe all body parts with assist levels (OT) Description: LTG: Patient will bathe all body parts with assist levels (OT) Flowsheets (Taken 10/06/2019 1608) LTG: Pt will perform bathing with assistance level/cueing: Moderate Assistance - Patient 50 - 74% Note: Downgraded due to slow progress    Problem: RH Dressing Goal: LTG Patient will perform upper body dressing (OT) Description: LTG Patient will perform upper body dressing with assist, with/without cues (OT). Flowsheets (Taken 10/06/2019 1620) LTG: Pt will perform upper body dressing with assistance level of: Minimal Assistance - Patient > 75% Note: Downgraded due to slow progress  Goal: LTG Patient will perform lower body dressing w/assist (OT) Description: LTG: Patient will perform lower body dressing with assist, with/without cues in positioning using equipment (OT) Flowsheets (Taken 10/06/2019 1620) LTG: Pt will perform lower body dressing with assistance level of: Moderate Assistance - Patient 50 - 74% Note: Downgraded due to slow progress    Problem: RH Toileting Goal: LTG Patient will perform toileting task (3/3 steps) with assistance level (OT) Description: LTG: Patient will perform toileting task (3/3 steps) with assistance level (OT)  Flowsheets (Taken 10/06/2019 1620) LTG: Pt will perform toileting task (3/3 steps) with assistance level: Moderate Assistance - Patient 50 - 74% Note: Downgraded due to slow progress

## 2019-10-07 ENCOUNTER — Encounter (HOSPITAL_COMMUNITY): Payer: Medicare Other | Admitting: Speech Pathology

## 2019-10-07 ENCOUNTER — Inpatient Hospital Stay (HOSPITAL_COMMUNITY): Payer: Medicare Other | Admitting: Speech Pathology

## 2019-10-07 ENCOUNTER — Encounter (HOSPITAL_COMMUNITY): Payer: Medicare Other | Admitting: Occupational Therapy

## 2019-10-07 ENCOUNTER — Ambulatory Visit (HOSPITAL_COMMUNITY): Payer: Medicare Other

## 2019-10-07 ENCOUNTER — Inpatient Hospital Stay (HOSPITAL_COMMUNITY): Payer: Medicare Other

## 2019-10-07 LAB — BASIC METABOLIC PANEL WITH GFR
Anion gap: 12 (ref 5–15)
BUN: 41 mg/dL — ABNORMAL HIGH (ref 8–23)
CO2: 26 mmol/L (ref 22–32)
Calcium: 9.5 mg/dL (ref 8.9–10.3)
Chloride: 110 mmol/L (ref 98–111)
Creatinine, Ser: 1.42 mg/dL — ABNORMAL HIGH (ref 0.44–1.00)
GFR calc Af Amer: 41 mL/min — ABNORMAL LOW
GFR calc non Af Amer: 35 mL/min — ABNORMAL LOW
Glucose, Bld: 153 mg/dL — ABNORMAL HIGH (ref 70–99)
Potassium: 4 mmol/L (ref 3.5–5.1)
Sodium: 148 mmol/L — ABNORMAL HIGH (ref 135–145)

## 2019-10-07 NOTE — Progress Notes (Signed)
Occupational Therapy Discharge Summary  Patient Details  Name: April Le MRN: [id number] Date of Birth: [birth year]  Patient has met 11 of 11 long term goals due to improved activity tolerance, improved balance, postural control, ability to compensate for deficits, functional use of  RIGHT upper extremity and improved coordination.  Patient to discharge at overall Mod Assist level.  Patient's spouse and daughter have attended hands on family education to provide the necessary assistance at discharge.    All goals met.   Recommendation:  Patient will benefit from ongoing skilled OT services in home health setting to continue to advance functional skills in the area of BADL.  Equipment: 3:1  Reasons for discharge: treatment goals met and discharge from hospital  Patient/family agrees with progress made and goals achieved: Yes  OT Discharge Precautions/Restrictions  Precautions Precautions: Fall Precaution Comments: Aphasic, apraxic Pain Pain Assessment Pain Scale: Faces Faces Pain Scale: No hurt ADL ADL Eating: Supervision/safety Where Assessed-Eating: Wheelchair Grooming: Supervision/safety Where Assessed-Grooming: Sitting at sink Upper Body Bathing: Supervision/safety Where Assessed-Upper Body Bathing: Sitting at sink Lower Body Bathing: Moderate assistance Where Assessed-Lower Body Bathing: Standing at sink, Sitting at sink Upper Body Dressing: Minimal assistance Where Assessed-Upper Body Dressing: Sitting at sink Lower Body Dressing: Moderate assistance Where Assessed-Lower Body Dressing: Sitting at sink, Standing at sink Toileting: Moderate assistance Where Assessed-Toileting: Glass blower/designer: Psychiatric nurse Method: Arts development officer: Radiographer, therapeutic: Not assessed Praxis Praxis: Impaired Praxis Impairment Details: Initiation;Ideation;Ideomotor;Perseveration;Motor planning Cognition Overall  Cognitive Status: Impaired/Different from baseline Arousal/Alertness: Lethargic Orientation Level: Other (comment)(Difficult to assess due to aphasia) Attention: Sustained;Focused Focused Attention: Impaired Sustained Attention: Impaired Problem Solving: Impaired Initiating: Impaired Behaviors: Restless;Impulsive;Perseveration Safety/Judgment: Impaired Sensation Sensation Light Touch: (Difficult to assess due to aphasia) Coordination Gross Motor Movements are Fluid and Coordinated: No Fine Motor Movements are Fluid and Coordinated: No Coordination and Movement Description: apraxic and uncoordinated movements Finger Nose Finger Test: Pt unable to complete due to apraxia Motor  Motor Motor: Motor apraxia;Abnormal postural alignment and control;Motor impersistence Mobility    Min A stand pivot BSC transfers without device  Trunk/Postural Assessment  Cervical Assessment Cervical Assessment: Exceptions to WFL(forward head) Thoracic Assessment Thoracic Assessment: Exceptions to WFL(kyphotic) Lumbar Assessment Lumbar Assessment: Exceptions to WFL(posterior pelvic tilt) Postural Control Postural Control: Deficits on evaluation(limited in standing)  Balance Balance Balance Assessed: Yes Dynamic Sitting Balance Dynamic Sitting - Level of Assistance: 5: Stand by assistance Dynamic Sitting - Balance Activities: Reaching for objects;Forward lean/weight shifting(during grooming tasks) Dynamic Standing Balance Dynamic Standing - Level of Assistance: 3: Mod assist Dynamic Standing - Balance Activities: Lateral lean/weight shifting;Forward lean/weight shifting Dynamic Standing - Comments: Assisting with clothing mgt during toileting tasks Extremity/Trunk Assessment RUE Assessment Active Range of Motion (AROM) Comments: WFLs General Strength Comments: 3-/5 LUE Assessment Active Range of Motion (AROM) Comments: WFLs General Strength Comments: 3-/5   Skeet Simmer 10/07/2019,  12:37 PM

## 2019-10-07 NOTE — Progress Notes (Addendum)
Speech Language Pathology Daily Session Note  Patient Details  Name: April Le MRN: [id number] Date of Birth: Mar 16, 1941  Today's Date: 10/07/2019 SLP Individual Time: 1400-1430 SLP Individual Time Calculation (min): 30 min  Short Term Goals: Week 3: SLP Short Term Goal 1 (Week 3): STGs=LTGs d/t short remaining length of stay  Skilled Therapeutic Interventions: Pt was seen for skilled ST targeting dysphagia goals. SLP facilitated session with set up assist and Min A for self feeding of Dys 3 solids, Supervision A verbal cues for use of swallow strategies during consumption of nectar thick liquids (NTL). Pt's overall intake of solids was poor this meal (consumed <25% of meal throughout 30 min session), and prolonged mastication observed. However, pt did consume a generous 16oz of NTL with only 1 immediate throat clear observed. Pt able to answer yes/no questions to express wants and needs/preferences between food and drink choices with ~80% accuracy. She also provided appropriate social greetings and some automatic responses with conversational prompts (ex: responded "I guess" at therapist's comment regarding need to wash her shirt since she spilled juice on it). Pt was left laying in bed with alarm set and call bell at side. Continue per current plan of care.       Pain Pain Assessment Pain Scale: Faces Faces Pain Scale: No hurt  Therapy/Group: Individual Therapy  April Le 10/07/2019, 3:06 PM

## 2019-10-07 NOTE — Progress Notes (Signed)
Speech Language Pathology Daily Session Note  Patient Details  Name: April Le MRN: [id number] Date of Birth: 1940-12-03  Today's Date: 10/07/2019 SLP Individual Time: [id number] SLP Individual Time Calculation (min): 57 min  Short Term Goals: Week 3: SLP Short Term Goal 1 (Week 3): STGs=LTGs d/t short remaining length of stay  Skilled Therapeutic Interventions: Pt was seen for skilled ST targeting education with pt, her daughter, April Le, and husband, April Le. SLP facilitated session with functional conversation regarding d/c plans; pt's family verbally acknowledged their ability to provide 24/7 supervision and hands on care via family members and potentially hired help. They are confident in their ability to provide necessary levels of cognitive and physical assistance. SLP provided verbal review and handouts regarding pt's current diet recommendations (dys 3, nectar, pills whole in nectar) in including review of how to achieve nectar consistency. Also reviewed and provided handout regarding water protocol, which pt can continue at home as long as family adheres to outlined instructions. All questions regarding diet answered to family's satisfaction. 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 Demonstration/conrete examples of cues to facilitated word finding provided, in addition to review of communication strategies detailed on written handout. Emphasized need for direct language when giving commands and providing choices when asking questions, when able. All questions regarding communication, cognition, and and dysphagia (inlcuding recommendation for repeat outpatient MBSS as determined appropriate by follow up April Le) answered to husband and daughter's satisfaction. Continue per current plan of care.        Pain Pain  Assessment Pain Scale: Faces Faces Pain Scale: No hurt  Therapy/Group: Individual Therapy  April Le 10/07/2019, 10:56 AM

## 2019-10-07 NOTE — Progress Notes (Signed)
Social Work Patient ID: Elayne Guerin, female   DOB: 19-Dec-1940, 78 y.o.   MRN: [id number]  Family-husband and daughter here for family training and it went well according to both of them. Have ordered hospital bed and 3 in 1 and follow up set up via [facility] since she has had them before after previous strokes. Prepare for Wed.

## 2019-10-07 NOTE — Progress Notes (Signed)
[facility] PHYSICAL MEDICINE & REHABILITATION PROGRESS NOTE   Subjective/Complaints:  No issues overnite , daughter in for family training.  Discussed concerns about HR, side effect of metoprolol, discussed lelvated BUN/Creat and reduction of lasix  ROS: limited due to language/communication   Objective:   No results found. No results for input(s): WBC, HGB, HCT, PLT in the last 72 hours. Recent Labs    10/07/19 0640  NA 148*  K 4.0  CL 110  CO2 26  GLUCOSE 153*  BUN 41*  CREATININE 1.42*  CALCIUM 9.5    Intake/Output Summary (Last 24 hours) at 10/07/2019 [id number] Last data filed at 10/06/2019 2300 Gross per 24 hour  Intake 1140 ml  Output -  Net 1140 ml     Physical Exam: Vital Signs Blood pressure (!) 140/93, pulse 98, temperature 98 F (36.7 C), temperature source Oral, resp. rate 15, height 5' (1.524 m), weight 67 kg, SpO2 100 %. Constitutional: No distress . Vital signs reviewed. HEENT: EOMI, oral membranes moist Neck: supple Cardiovascular: RRR without murmur. No JVD    Respiratory: CTA Bilaterally without wheezes or rales. Normal effort    GI: BS +, non-tender, non-distended  Skin: Warm and dry.  Intact. Psych: Normal mood.  Normal behavior. Musc:no pain with UE LE ROM, no evidence of edema  Neurologic: Alert., expressive>receptive aphasia. Occasionally mumbles, did not consistently follow simple commands Motor: Limited due to participation, but appears to be moving all extremities spontaneously, unchanged  Assessment/Plan: 1. 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  Physiatrist is providing close team supervision and 24 hour management of active medical problems listed below.  Physiatrist and rehab team continue to assess barriers to discharge/monitor patient progress toward functional and medical goals  Care Tool:  Bathing  Bathing  activity did not occur: Refused Body parts bathed by patient: Right arm, Left arm, Chest, Abdomen, Face   Body parts bathed by helper: Right arm, Left arm, Chest, Abdomen, Buttocks, Right upper leg, Left upper leg, Right lower leg, Left lower leg     Bathing assist Assist Level: Maximal Assistance - Patient 24 - 49%     Upper Body Dressing/Undressing Upper body dressing Upper body dressing/undressing activity did not occur (including orthotics): Refused What is the patient wearing?: Pull over shirt    Upper body assist Assist Level: Minimal Assistance - Patient > 75%    Lower Body Dressing/Undressing Lower body dressing    Lower body dressing activity did not occur: Refused What is the patient wearing?: Pants, Incontinence brief     Lower body assist Assist for lower body dressing: Moderate Assistance - Patient 50 - 74%     Toileting Toileting Toileting Activity did not occur Landscape architect and hygiene only): Refused  Toileting assist Assist for toileting: Moderate Assistance - Patient 50 - 74%     Transfers Chair/bed transfer  Transfers assist  Chair/bed transfer activity did not occur: Safety/medical concerns  Chair/bed transfer assist level: Maximal Assistance - Patient 25 - 49%     Locomotion Ambulation   Ambulation assist   Ambulation activity did not occur: Safety/medical concerns  Assist level: Minimal Assistance - Patient > 75% Assistive device: Walker-rolling Max distance: 60   Walk 10 feet activity   Assist  Walk 10 feet activity did not occur: Safety/medical concerns  Assist level: Minimal Assistance - Patient > 75% Assistive device: Walker-rolling   Walk 50 feet  activity   Assist Walk 50 feet with 2 turns activity did not occur: Safety/medical concerns  Assist level: Minimal Assistance - Patient > 75% Assistive device: Walker-rolling    Walk 150 feet activity   Assist Walk 150 feet activity did not occur: Safety/medical  concerns         Walk 10 feet on uneven surface  activity   Assist Walk 10 feet on uneven surfaces activity did not occur: Safety/medical concerns         Wheelchair     Assist Will patient use wheelchair at discharge?: Yes   Wheelchair activity did not occur: Safety/medical concerns  Wheelchair assist level: Supervision/Verbal cueing Max wheelchair distance: 50    Wheelchair 50 feet with 2 turns activity    Assist    Wheelchair 50 feet with 2 turns activity did not occur: Safety/medical concerns   Assist Level: Supervision/Verbal cueing   Wheelchair 150 feet activity     Assist  Wheelchair 150 feet activity did not occur: Safety/medical concerns       Blood pressure (!) 140/93, pulse 98, temperature 98 F (36.7 C), temperature source Oral, resp. rate 15, height 5' (1.524 m), weight 67 kg, SpO2 100 %.  Medical Problem List and Plan: 1.  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  Status post loop recorder placement  Cont CIR PT, OT,  SLP 2.  Antithrombotics:  -DVT/anticoagulation: Lovenox.             -antiplatelet therapy: Aspirin 325 mg daily, Plavix 75 mg daily- 3. Pain Management: Tylenol as needed 4. Mood: Aricept 5 mg daily, Seroquel 25 mg nightly             -antipsychotic agents: seroquel QHS for sundowning, increased to 50mg  on 11/13 5. Neuropsych: This patient is not capable of making decisions on her own behalf. 6. Skin/Wound Care: Routine skin checks 7. Fluids/Electrolytes/Nutrition: Routine in and outs 8.  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  Monitor for any signs of fluid overload.  Lasix 40 mg twice daily Filed Weights   10/05/19 0507 10/06/19 0619 10/07/19 0516  Weight: 65.6 kg 66.2 kg 67 kg    Stable on 11/23 10.  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 ?AKI versus CKD stage III.    Creatinine 1.30 on 11/20   Encourage fluids, however may be difficult to maintain given nectar liquids    Pre renal azotemia - po fluid improved but on Lasix 40mg  BID, will reduce to 20mg  BID   11.  Hypertension.  Lopressor 50 mg twice daily.  Monitor with increased mobility Vitals:   10/06/19 1848 10/07/19 0516  BP: (!) 112/95 (!) 140/93  Pulse: 60 98  Resp: 15 15  Temp: 98.6 F (37 C) 98 F (36.7 C)  SpO2: 97% 100%  good control - on metoprolol and lasix  12.  Hypothyroidism.  Synthroid 13.  Hyperlipidemia.  Lipitor 14.  Obesity: BMI 29.7 15. Post stroke seizure: complex partial   Continue Keppra 500mg  BID po as per Neuro, neuro has signed off but they will need to see her as an outpatient  No reported seizures  16.  OSA CPAP with oxygen at home. 17. Bradycardia:  Due to BB- stable  18.  Acute blood loss anemia  Hemoglobin 11.2  on 11/6, labs ordered for tomorrow 19.  HypoK due to loop diuretic, start [place] 29meq 11/18 , repeat BMET K+ 3.7 11/21  -recheck 4.0   LOS:20 days A FACE TO FACE EVALUATION WAS PERFORMED  Charlett Blake 10/07/2019, 8:12 AM

## 2019-10-07 NOTE — Progress Notes (Signed)
Site checked Steri strip had been removed already Site is stable, clean dry, no erythema, edema or increased heat to the surrounding tissues Small stable scab noted No evidence of infection Transmitter noted in room.  Tommye Standard, PA-C

## 2019-10-07 NOTE — Discharge Summary (Signed)
Physician Discharge Summary  Patient ID: April Le MRN: [id number] DOB/AGE: 1941-10-20 78 y.o.  Admit date: 09/17/2019 Discharge date: 10/09/2019  Discharge Diagnoses:  Principal Problem:   Left middle cerebral artery stroke [facility]) Active Problems:   Bradycardia   Seizure ([place])   AKI (acute kidney injury) ([place])   Sundowning   Dysphagia, post-stroke   Chronic diastolic congestive heart failure (HCC) OSA with CPAP  Discharged Condition: Stable  Significant Diagnostic Studies: Ct Code Stroke Cta Head W/wo Contrast  Result Date: 09/11/2019 CLINICAL DATA:  Stroke symptoms EXAM: CT ANGIOGRAPHY HEAD AND NECK CT PERFUSION BRAIN TECHNIQUE: Multidetector CT imaging of the head and neck was performed using the standard protocol during bolus administration of intravenous contrast. Multiplanar CT image reconstructions and MIPs were obtained to evaluate the vascular anatomy. Carotid stenosis measurements (when applicable) are obtained utilizing NASCET criteria, using the distal internal carotid diameter as the denominator. Multiphase CT imaging of the brain was performed following IV bolus contrast injection. Subsequent parametric perfusion maps were calculated using RAPID software. CONTRAST:  113mL OMNIPAQUE IOHEXOL 350 MG/ML SOLN COMPARISON:  Head CT from earlier today. FINDINGS: Delayed study relative to the noncontrast CT due to difficulty with IV access per report. CTA NECK FINDINGS Aortic arch: Atherosclerotic calcification.  Three vessel branching. Right carotid system: Limited atheromatous changes. No stenosis or ulceration. Left carotid system: Limited atheromatous changes for age. No stenosis or ulceration. Vertebral arteries: Proximal subclavian atherosclerosis is mild. Widely patent vertebral arteries with smooth luminal contour. Skeleton: No acute finding.  Cervical spine degeneration. Other neck: No significant incidental finding Upper chest: Negative Review of the MIP images confirms the  above findings CTA HEAD FINDINGS Anterior circulation: Central branching filling defect at the left distal M1 and proximal M2 segments with underfilling of downstream vessels. No right-sided embolism or branch occlusion is seen. Hypoplastic left A1 segment. Atherosclerotic plaque on both carotid siphons mild to moderate narrowing on the right. Posterior circulation: Vertebrobasilar arteries are smooth and widely patent. No branch occlusion or beading. Negative for aneurysm Venous sinuses: Unremarkable in the arterial phase Anatomic variants: As above Review of the MIP images confirms the above findings CT Brain Perfusion Findings: ASPECTS: Not scored given the extent of chronic changes. No acute infarct is detected. CBF (<30%) Volume: 75mL-some of this mapping may be overestimated as there is likely some bleeding into the chronic infarct the lateral frontal lobe. Perfusion (Tmax>6.0s) volume: 59mL Mismatch Volume: 56 with mismatched ratio of 2.39mL Infarction Location:Left posterior frontal These results were communicated to Dr. Lorraine Lax at [place] 10/28/2020by text page via the [facility] messaging system. Embolectomy procedure has already been ordered. IMPRESSION: 1. 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 There has been a remote moderate left lateral frontal and upper insular infarct; CT perfusion maps show 38 cc of completed acute infarct and 56 cc of superimposed penumbra. 2. Limited atherosclerosis for age. No underlying flow limiting stenosis or embolic source identified. Electronically Signed   By: Monte Fantasia M.D.   On: 09/11/2019 07:27   Dg Abd 1 View  Result Date: 09/16/2019 CLINICAL DATA:  Ten French enteric feeding tube placed via fluoroscopy using 15 mL of Omnipaque 300. EXAM: ABDOMEN - 1 VIEW COMPARISON:  KUB 10/28/2016 FINDINGS: Dobbhoff feeding tube courses through the stomach and duodenum with tip over the proximal jejunum left of  midline in the upper abdomen. Contrast is seen filling the proximal jejunum. Bowel gas pattern otherwise unremarkable. Mild degenerate change of the  spine. Stable compression fracture over the upper lumbar spine. IMPRESSION: Nonobstructive bowel gas pattern. Dobbhoff feeding tube with tip over the proximal jejunum in the left mid to upper abdomen. Electronically Signed   By: Marin Olp M.D.   On: 09/16/2019 16:16   Ct Code Stroke Cta Neck W/wo Contrast  Result Date: 09/11/2019 CLINICAL DATA:  Stroke symptoms EXAM: CT ANGIOGRAPHY HEAD AND NECK CT PERFUSION BRAIN TECHNIQUE: Multidetector CT imaging of the head and neck was performed using the standard protocol during bolus administration of intravenous contrast. Multiplanar CT image reconstructions and MIPs were obtained to evaluate the vascular anatomy. Carotid stenosis measurements (when applicable) are obtained utilizing NASCET criteria, using the distal internal carotid diameter as the denominator. Multiphase CT imaging of the brain was performed following IV bolus contrast injection. Subsequent parametric perfusion maps were calculated using RAPID software. CONTRAST:  155mL OMNIPAQUE IOHEXOL 350 MG/ML SOLN COMPARISON:  Head CT from earlier today. FINDINGS: Delayed study relative to the noncontrast CT due to difficulty with IV access per report. CTA NECK FINDINGS Aortic arch: Atherosclerotic calcification.  Three vessel branching. Right carotid system: Limited atheromatous changes. No stenosis or ulceration. Left carotid system: Limited atheromatous changes for age. No stenosis or ulceration. Vertebral arteries: Proximal subclavian atherosclerosis is mild. Widely patent vertebral arteries with smooth luminal contour. Skeleton: No acute finding.  Cervical spine degeneration. Other neck: No significant incidental finding Upper chest: Negative Review of the MIP images confirms the above findings CTA HEAD FINDINGS Anterior circulation: Central branching  filling defect at the left distal M1 and proximal M2 segments with underfilling of downstream vessels. No right-sided embolism or branch occlusion is seen. Hypoplastic left A1 segment. Atherosclerotic plaque on both carotid siphons mild to moderate narrowing on the right. Posterior circulation: Vertebrobasilar arteries are smooth and widely patent. No branch occlusion or beading. Negative for aneurysm Venous sinuses: Unremarkable in the arterial phase Anatomic variants: As above Review of the MIP images confirms the above findings CT Brain Perfusion Findings: ASPECTS: Not scored given the extent of chronic changes. No acute infarct is detected. CBF (<30%) Volume: 26mL-some of this mapping may be overestimated as there is likely some bleeding into the chronic infarct the lateral frontal lobe. Perfusion (Tmax>6.0s) volume: 74mL Mismatch Volume: 56 with mismatched ratio of 2.57mL Infarction Location:Left posterior frontal These results were communicated to Dr. Lorraine Lax at [place] 10/28/2020by text page via the [facility] messaging system. Embolectomy procedure has already been ordered. IMPRESSION: 1. 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 There has been a remote moderate left lateral frontal and upper insular infarct; CT perfusion maps show 38 cc of completed acute infarct and 56 cc of superimposed penumbra. 2. Limited atherosclerosis for age. No underlying flow limiting stenosis or embolic source identified. Electronically Signed   By: Monte Fantasia M.D.   On: 09/11/2019 07:27   Mr Brain Wo Contrast  Result Date: 09/12/2019 CLINICAL DATA:  Mild aphasia. EXAM: MRI HEAD WITHOUT CONTRAST TECHNIQUE: Multiplanar, multiecho pulse sequences of the brain and surrounding structures were obtained without intravenous contrast. COMPARISON:  Code stroke CT from yesterday FINDINGS: Brain: Restricted diffusion throughout the large majority of the left MCA distribution  cortex, with sparing at the margins including along the upper motor strip and in the superficial left temporal lobe. Tiny acute cortical infarct seen in the parasagittal right frontal lobe, ACA territory. Petechial hemorrhage is present along the left cerebral infarct. Moderate remote left MCA branch infarct affecting the lateral frontal  lobe and upper insula. Remote right occipital infarct. Small remote right cerebellar infarct. Ischemic gliosis is confluent in the cerebral white matter. Age congruent volume loss. Vascular: Normal flow voids Skull and upper cervical spine: Negative for marrow lesion Sinuses/Orbits: Right cataract resection. Mild mucosal thickening in the paranasal sinuses. Partial opacification of the right mastoid air cells. IMPRESSION: 1. Acute cortical infarct involving the majority of the left MCA distribution, sparing the upper perirolandic cortex and the superficial left temporal lobe. There is patchy petechial hemorrhage. 2. 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 Extensive chronic small vessel ischemia. Electronically Signed   By: Monte Fantasia M.D.   On: 09/12/2019 07:06   [place]  Result Date: 09/12/2019 INDICATION: New onset of left gaze deviation, aphasia, and right-sided weakness. Occluded left middle cerebral artery proximally on CT angiogram of the head and neck. EXAM: 1. EMERGENT LARGE VESSEL OCCLUSION THROMBOLYSIS (anterior CIRCULATION) COMPARISON:  CT angiogram of the head and neck of September 11, 2019. MEDICATIONS: Vancomycin 1 g IV antibiotic was administered within 1 hour of the procedure. ANESTHESIA/SEDATION: General anesthesia. CONTRAST:  Isovue 300 approximately 60 mL. FLUOROSCOPY TIME:  Fluoroscopy Time: 36 minutes 0 seconds (1498 mGy). COMPLICATIONS: None immediate. TECHNIQUE: Following a full explanation of the procedure along with the potential associated complications, an informed witnessed consent was obtained from the spouse and patient's daughter.  The risks of intracranial hemorrhage of 10%, worsening neurological deficit, ventilator dependency, death and inability to revascularize were all reviewed in detail with the patient's spouse and daughter. The patient was then put under general anesthesia by the Department of Anesthesiology at [facility]. The right groin was prepped and draped in the usual sterile fashion. Thereafter using modified Seldinger technique, transfemoral access into the right common femoral artery was obtained without difficulty. Over a 0.035 inch guidewire a 5 French Pinnacle sheath was inserted. Through this, and also over a 0.035 inch guidewire a 5 [place] JB 1 catheter was advanced to the aortic arch region and selectively positioned in the left common carotid artery. FINDINGS: The left common carotid arteriogram demonstrates the left external carotid artery and its major branches to be widely patent. The left internal carotid artery at the bulb to the cranial skull base demonstrates wide patency. The petrous, cavernous and the supraclinoid segments are widely patent. A small infundibulum is seen at the origin of the left posterior communicating artery. The left anterior cerebral artery opacifies into the capillary and venous phases. The left middle cerebral artery just distal to the anterior temporal branch demonstrates complete occlusion. PROCEDURE: The diagnostic JB 1 catheter in the left common carotid artery was then exchanged over a 0.035 300 cm Rosen exchange guidewire for an 8 [place] Pinnacle sheath which was then connected to continuous heparinized saline infusion. Over the [facility] exchange guidewire, a 95 cm 087 [place] balloon guide catheter which had been prepped with 50% contrast and 50% heparinized saline infusion was advanced and positioned just proximal to the left common carotid bifurcation. The guidewire was removed. Good aspiration obtained from the hub of the balloon guide catheter. A gentle control arteriogram  performed through this demonstrated no change in the intracranial or the extracranial circulation. Over a 0.014 inch Softip Synchro micro guidewire, a combination of an 021 Trevo ProVue microcatheter inside of a 6 French 132 cm Catalyst guide catheter was advanced without difficulty to the supraclinoid left ICA. With the micro guidewire leading with a J-tip configuration to avoid dissections or inducing spasm, the  combination was navigated to the proximal left middle cerebral artery. Using a torque device the micro guidewire was then advanced to the distal M2 M3 region of the inferior division followed by the microcatheter. The guidewire was removed. Good aspiration was obtained from the hub of the microcatheter. Gentle control arteriogram performed through the microcatheter demonstrated safe position of tip of the microcatheter. This was then connected to continuous heparinized saline infusion. A 5 mm x 33 mm Embotrap retrieval device was then advanced to the distal end of the microcatheter. With the O ring on the delivery microcatheter loosened, and slight forward traction with the right hand on the delivery micro guidewire, the distal and then the proximal portion of the device was then deployed. At this time the 6 [place] Catalyst guide catheter was advanced in order to engage the proximal portion of the clot. With proximal flow arrest in the right internal carotid artery with balloon catheter having been advanced into the proximal 1/3, and constant aspiration being applied with a 60 mL syringe at the hub of the balloon guide catheter and aspiration applied with a Penumbra aspiration device at the hub of the 6 [place] Catalyst guide catheter for approximately 2-1/2 minutes, the combination of the retrieval device, the microcatheter and the 6 [place] Catalyst guide catheter was retrieved and removed. Aspiration was continued whilst the balloon was deflated in the left internal carotid artery. A few specks of clot  were seen entangled in the cells of the retrieval device. A control arteriogram performed through the balloon guide catheter in the proximal left internal carotid artery demonstrated slight proximal migration of the clot compared to previously. A second pass was then made again using the above combination. After having ascertained safe position of tip of the microcatheter in the M2 M3 region of the inferior division, and having verified the safe tip of the microcatheter, a 4 mm x 40 mm Solitaire X retrieval device was advanced to the distal end of the microcatheter. The O ring on the delivery microcatheter was then loosened. With slight forward gentle traction with the right hand on the delivery micro guidewire, with the left hand the delivery microcatheter was retrieved unsheathing the retrieval device. The 6 [place] Catalyst guide catheter was now advanced further into the clot of the left middle cerebral artery. Proximal flow arrest was then initiated by inflating the balloon guide catheter in the mid 1/3 of the left internal carotid artery. Constant aspiration with a Penumbra aspiration device was then initiated at the hub of the Catalyst guide catheter for approximately 2 minutes. Following this, the combination of the retrieval device, the microcatheter and the 6 [place] Catalyst guide catheter were then retrieved and removed as constant aspiration was applied at the hub of the balloon guide catheter with deflation of the balloon in the left internal carotid artery. Free back bleed of blood was seen at the hub of the balloon guide catheter. A control arteriogram performed through the balloon guide catheter in the left internal carotid artery demonstrated complete revascularization of the left middle cerebral artery achieving a TICI 3 revascularization. The left anterior cerebral artery remained widely patent as well. Wide patency of the left internal carotid artery extra cranially was maintained with mild spasm  at the distal 1/3 of the left internal carotid artery. The balloon guide catheter was retrieved and removed. The 8 French Pinnacle sheath in the right groin was then removed with the successful application of an 8 [place] Angio-Seal closure device. The right groin  appeared soft without evidence of a hematoma or bleeding. Distal pulses in the dorsalis pedis, and the posterior tibial regions remained palpable unchanged. A flat panel CT of the brain revealed no evidence of an intracranial hemorrhage, or mass effect or midline shift. The patient's general anesthesia was then reversed and the patient extubated. Upon recovery, the patient was able to move her left arm and leg, and bend her right knee spontaneously and to command. She was then transferred to the PACU and then neuro ICU for post thrombectomy management. IMPRESSION: Status post endovascular complete revascularization of occluded left middle cerebral artery M1 segment with 1 pass with the Embotrap 5 mm x 33 mm retrieval device, and 1 pass with the Solitaire 4 mm x 40 mm X retrieval device, and adjunct Penumbra aspiration achieving a TICI 3 revascularization. PLAN: Follow-up in clinic 4 weeks post discharge. Electronically Signed   By: Luanne Bras M.D.   On: 09/11/2019 11:53   Ct Code Stroke Cta Cerebral Perfusion W/wo Contrast  Result Date: 09/11/2019 CLINICAL DATA:  Stroke symptoms EXAM: CT ANGIOGRAPHY HEAD AND NECK CT PERFUSION BRAIN TECHNIQUE: Multidetector CT imaging of the head and neck was performed using the standard protocol during bolus administration of intravenous contrast. Multiplanar CT image reconstructions and MIPs were obtained to evaluate the vascular anatomy. Carotid stenosis measurements (when applicable) are obtained utilizing NASCET criteria, using the distal internal carotid diameter as the denominator. Multiphase CT imaging of the brain was performed following IV bolus contrast injection. Subsequent parametric perfusion maps  were calculated using RAPID software. CONTRAST:  136mL OMNIPAQUE IOHEXOL 350 MG/ML SOLN COMPARISON:  Head CT from earlier today. FINDINGS: Delayed study relative to the noncontrast CT due to difficulty with IV access per report. CTA NECK FINDINGS Aortic arch: Atherosclerotic calcification.  Three vessel branching. Right carotid system: Limited atheromatous changes. No stenosis or ulceration. Left carotid system: Limited atheromatous changes for age. No stenosis or ulceration. Vertebral arteries: Proximal subclavian atherosclerosis is mild. Widely patent vertebral arteries with smooth luminal contour. Skeleton: No acute finding.  Cervical spine degeneration. Other neck: No significant incidental finding Upper chest: Negative Review of the MIP images confirms the above findings CTA HEAD FINDINGS Anterior circulation: Central branching filling defect at the left distal M1 and proximal M2 segments with underfilling of downstream vessels. No right-sided embolism or branch occlusion is seen. Hypoplastic left A1 segment. Atherosclerotic plaque on both carotid siphons mild to moderate narrowing on the right. Posterior circulation: Vertebrobasilar arteries are smooth and widely patent. No branch occlusion or beading. Negative for aneurysm Venous sinuses: Unremarkable in the arterial phase Anatomic variants: As above Review of the MIP images confirms the above findings CT Brain Perfusion Findings: ASPECTS: Not scored given the extent of chronic changes. No acute infarct is detected. CBF (<30%) Volume: 74mL-some of this mapping may be overestimated as there is likely some bleeding into the chronic infarct the lateral frontal lobe. Perfusion (Tmax>6.0s) volume: 55mL Mismatch Volume: 56 with mismatched ratio of 2.6mL Infarction Location:Left posterior frontal These results were communicated to Dr. Lorraine Lax at [place] 10/28/2020by text page via the [facility] messaging system. Embolectomy procedure has already been ordered. IMPRESSION:  1. 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 There has been a remote moderate left lateral frontal and upper insular infarct; CT perfusion maps show 38 cc of completed acute infarct and 56 cc of superimposed penumbra. 2. Limited atherosclerosis for age. No underlying flow limiting stenosis or embolic source identified. Electronically  Signed   By: Monte Fantasia M.D.   On: 09/11/2019 07:27   Dg Chest Port 1 View  Result Date: 09/17/2019 CLINICAL DATA:  78 year old female with shortness of breath. Feeding tube. EXAM: PORTABLE CHEST 1 VIEW COMPARISON:  09/16/2019 portable chest and earlier. FINDINGS: Portable AP semi upright view at 1108 hours. Stable somewhat low lung volumes. Allowing for portable technique the lungs are clear. Mediastinal contours remain within normal limits. Left medial chest cardiac event recorder again noted. Enteric feeding tube courses into the abdomen, and a portion of this appears to be visible across midline near the cholecystectomy clips. Negative visible bowel gas pattern. IMPRESSION: 1.  No acute cardiopulmonary abnormality. 2. Enteric feeding tube courses to the abdomen and probably is post pyloric, incompletely visible. Electronically Signed   By: Genevie Ann M.D.   On: 09/17/2019 11:21   Dg Chest Port 1 View  Result Date: 09/16/2019 CLINICAL DATA:  Stroke, pulmonary fibrosis, history hypertension, CHF, chronic kidney disease EXAM: PORTABLE CHEST 1 VIEW COMPARISON:  Portable exam 1754 hours compared to 10/29/2016 FINDINGS: Feeding tube extends into stomach. Ovoid device projects over heart question loop recorder. Enlargement of cardiac silhouette with slight vascular congestion. Mediastinal contours normal. Lungs clear. No pleural effusion or pneumothorax. IMPRESSION: Enlargement of cardiac silhouette with vascular congestion. No acute infiltrates. Electronically Signed   By: Lavonia Dana M.D.   On: 09/16/2019 18:21   Dg  Swallowing Func-speech Pathology  Result Date: 10/04/2019 Objective Swallowing Evaluation: Type of Study: MBS-Modified Barium Swallow Study  Patient Details Name: April Le MRN: [id number] Date of Birth: 1941/01/13 Today's Date: 10/04/2019 Time: SLP Start Time (ACUTE ONLY): 1235 -SLP Stop Time (ACUTE ONLY): 1300 SLP Time Calculation (min) (ACUTE ONLY): 25 min Past Medical History: Past Medical History: Diagnosis Date . Abdominal distention   past two months . Abnormality of gait 10/28/2015 . 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 Arthritis   right knee . Benign essential HTN 11/19/2013 . CAP (community acquired pneumonia) 09/07/2013 . CHF (congestive heart failure) ([place])  . Chronic diastolic heart failure ([place]) 11/19/2013 . Chronic kidney disease   STAGE III KIDNEY DISEASE-PER PT--PT SEES DR. Risa Grill UROLOGIST . Complication of anesthesia   PT REMEMBERS BREATHING PROBLEMS WAKING UP FROM KNEE SURGERY AT [facility] 2011 OR 2012 . Coronary artery calcification seen on CT scan 01/10/2017 . Fever of unknown origin 09/04/2013 . GERD (gastroesophageal reflux disease)  . Headache  . Headache(784.0)  . 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 Hypercholesteremia  . Hypertension  . Hypertensive heart disease with heart failure ([place]) 01/10/2017 . Hypothyroidism  . Hypoxia 09/04/2013 . Leukocytosis 09/04/2013 . Mass of ovary 10/26/2011 . OSA (obstructive sleep apnea) 11/19/2013 . Pneumonia  . Shortness of breath   WITH EXERTION . Sinus drainage   PT STARTED ANTIBIOTIC 11/17/11 --IS HOARSE TODAY, SOME WHEEZING AND COUGH WITH YELLOW DRAINAGE . SIRS (systemic inflammatory response syndrome) ([place]) 09/04/2013 . SOB (shortness of breath) 09/04/2013 . Stroke ([place]) 02/2007, 08/2009  2 total -RESIDUAL WEAKNESS LEFT LEG--AND APHASIA . Stroke ([place]) 09/01/2017 . Stroke ([place]) 09/08/2017 . Tumor of ovary  . Urinary incontinence  . Urinary incontinence  . Weakness  of left leg   Mildly, post stroke Past Surgical History: Past Surgical History: Procedure Laterality Date . ABDOMINAL HYSTERECTOMY   . CHOLECYSTECTOMY   . IR CT HEAD LTD  09/11/2019 . IR PERCUTANEOUS ART THROMBECTOMY/INFUSION INTRACRANIAL INC DIAG ANGIO  09/11/2019 . LAPAROTOMY  11/22/2011  Procedure: EXPLORATORY LAPAROTOMY;  Surgeon: Imagene Gurney A. Alycia Rossetti, MD;  Location: [facility] ORS;  Service: Gynecology;  Laterality: N/A; . LOOP RECORDER INSERTION N/A 09/16/2019  Procedure: LOOP RECORDER INSERTION;  Surgeon: Evans Lance, MD;  Location: [facility];  Service: Cardiovascular;  Laterality: N/A; . MENISCUS REPAIR   . RADIOLOGY WITH ANESTHESIA N/A 09/11/2019  Procedure: IR WITH ANESTHESIA;  Surgeon: Radiologist, Medication, MD;  Location: [facility];  Service: Radiology;  Laterality: N/A; . SALPINGOOPHORECTOMY  11/22/2011  Procedure: SALPINGO OOPHERECTOMY;  Surgeon: Imagene Gurney A. Alycia Rossetti, MD;  Location: [facility];  Service: Gynecology;  Laterality: Bilateral; . TOE SURGERY   . TONSILLECTOMY   . TUBAL LIGATION   HPI: April Le is a 78 y.o. female with past medical history significant for congestive heart failure(chronic diastolic heart failure), prior stroke, hypertension, hyperlipidemia and prior stroke with residual mild left leg weakness and mild aphasia. She presented to the emergency department as a code stroke for left gaze deviation, right-sided weakness and aphasia on 09/11/19 when she was found non-verbal with right side weakness and facial droop. Stat CT head was obtained which showed no acute hemorrhage/infarct.  CT head did demonstrate old infarcts most notably left MCA stroke and R PCA stroke.  CT angiogram and CT perfusion showed left M1 occlusion. Intubated briefly for arteriogram and TICI revascularization.   No data recorded Assessment / Plan / Recommendation CHL IP CLINICAL IMPRESSIONS 10/04/2019 Clinical Impression Pt continues to present with high risk of aspiration when consuming thin liquids. 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 To compensate, straw was given but also resulted in more of the bolus being aspirated. Pt's orla phase is also impacted by apraxia and presents iwth oral holding and diffuclty with bolus cohesion and complete oral clearing. This results with severe premature spillage, multiple swallows and pharyngeal residue with aspiration after the swallow of oral residuals. Pt continues to exhibit delayed swallow at pyriform sinuses with aspiration before and during the swallow. Pt's safest diet continues to be dysphagia 3 with nectar thick liquids, medicine whole with nectar thick liquids and water protocol via tsp. SLP Visit Diagnosis Dysphagia, oropharyngeal phase (R13.12) Attention and concentration deficit following -- Frontal lobe and executive function deficit following -- Impact on safety and function Mild aspiration risk   CHL IP TREATMENT RECOMMENDATION 10/04/2019 Treatment Recommendations Therapy as outlined in treatment plan below   Prognosis 09/12/2019 Prognosis for Safe Diet Advancement Good Barriers to Reach Goals Time post onset Barriers/Prognosis Comment -- CHL IP DIET RECOMMENDATION 10/04/2019 SLP Diet Recommendations Dysphagia 3 (Mech soft) solids;Nectar thick liquid Liquid Administration via Cup Medication Administration Whole meds with liquid Compensations Minimize environmental distractions;Slow rate;Small sips/bites Postural Changes Seated upright at 90 degrees   CHL IP OTHER RECOMMENDATIONS 10/04/2019 Recommended Consults -- Oral Care Recommendations Oral care BID Other Recommendations Order thickener from pharmacy;Remove water pitcher;Have oral  suction available;Prohibited food (jello, ice cream, thin soups)   CHL IP FOLLOW UP RECOMMENDATIONS 09/16/2019 Follow up Recommendations Inpatient Rehab   CHL IP FREQUENCY AND DURATION 09/12/2019 Speech Therapy Frequency (ACUTE ONLY) min 2x/week Treatment Duration 2 weeks      CHL IP ORAL PHASE 10/04/2019 Oral  Phase Impaired Oral - Pudding Teaspoon -- Oral - Pudding Cup -- Oral - Honey Teaspoon -- Oral - Honey Cup -- Oral - Nectar Teaspoon NT Oral - Nectar Cup Weak lingual manipulation;Holding of bolus Oral - Nectar Straw -- Oral - Thin Teaspoon Weak lingual manipulation;Holding of bolus;Piecemeal swallowing;Delayed oral transit;Decreased bolus cohesion;Premature spillage;Reduced posterior propulsion Oral - Thin Cup Weak lingual manipulation;Reduced posterior propulsion;Holding of bolus;Piecemeal swallowing;Delayed oral transit;Decreased bolus cohesion;Premature spillage Oral - Thin Straw Weak lingual manipulation;Reduced posterior propulsion;Holding of bolus;Piecemeal swallowing;Delayed oral transit;Decreased bolus cohesion;Premature spillage Oral - Puree NT Oral - Mech Soft NT Oral - Regular -- Oral - Multi-Consistency -- Oral - Pill -- Oral Phase - Comment --  CHL IP PHARYNGEAL PHASE 10/04/2019 Pharyngeal Phase Impaired Pharyngeal- Pudding Teaspoon -- Pharyngeal -- Pharyngeal- Pudding Cup -- Pharyngeal -- Pharyngeal- Honey Teaspoon -- Pharyngeal -- Pharyngeal- Honey Cup -- Pharyngeal -- Pharyngeal- Nectar Teaspoon NT Pharyngeal -- Pharyngeal- Nectar Cup Delayed swallow initiation-vallecula Pharyngeal -- Pharyngeal- Nectar Straw -- Pharyngeal -- Pharyngeal- Thin Teaspoon Delayed swallow initiation-pyriform sinuses;Penetration/Aspiration before swallow;Penetration/Aspiration during swallow;Penetration/Apiration after swallow;Moderate aspiration;Pharyngeal residue - valleculae;Pharyngeal residue - pyriform Pharyngeal Material enters airway, passes BELOW cords and not ejected out despite cough attempt by  patient;Material enters airway, passes BELOW cords without attempt by patient to eject out (silent aspiration);Material enters airway, remains ABOVE vocal cords then ejected out;Material enters airway, passes BELOW cords then ejected out;Material enters airway, remains ABOVE vocal cords and not ejected out Pharyngeal- Thin Cup Delayed swallow initiation-pyriform sinuses;Penetration/Aspiration before swallow;Penetration/Aspiration during swallow;Penetration/Apiration after swallow;Moderate aspiration;Pharyngeal residue - valleculae;Pharyngeal residue - pyriform Pharyngeal Material enters airway, passes BELOW cords without attempt by patient to eject out (silent aspiration);Material enters airway, passes BELOW cords and not ejected out despite cough attempt by patient;Material enters airway, remains ABOVE vocal cords then ejected out;Material enters airway, passes BELOW cords then ejected out;Material enters airway, remains ABOVE vocal cords and not ejected out Pharyngeal- Thin Straw Delayed swallow initiation-pyriform sinuses;Penetration/Aspiration before swallow;Penetration/Aspiration during swallow;Penetration/Apiration after swallow;Moderate aspiration;Pharyngeal residue - valleculae;Pharyngeal residue - pyriform Pharyngeal Material enters airway, passes BELOW cords and not ejected out despite cough attempt by patient Pharyngeal- Puree NT Pharyngeal -- Pharyngeal- Mechanical Soft NT Pharyngeal -- Pharyngeal- Regular -- Pharyngeal -- Pharyngeal- Multi-consistency -- Pharyngeal -- Pharyngeal- Pill -- Pharyngeal -- Pharyngeal Comment --  CHL IP CERVICAL ESOPHAGEAL PHASE 10/04/2019 Cervical Esophageal Phase WFL Pudding Teaspoon -- Pudding Cup -- Honey Teaspoon -- Honey Cup -- Nectar Teaspoon -- Nectar Cup -- Nectar Straw -- Thin Teaspoon -- Thin Cup -- Thin Straw -- Puree -- Mechanical Soft -- Regular -- Multi-consistency -- Pill -- Cervical Esophageal Comment -- April Le 10/04/2019, 2:34 PM              Dg  Swallowing Func-speech Pathology  Result Date: 09/26/2019 Objective Swallowing Evaluation: Type of Study: MBS-Modified Barium Swallow Study  Patient Details Name: April Le MRN: [id number] Date of Birth: 01-14-1941 Today's Date: 09/26/2019 Time: SLP Start Time (ACUTE ONLY): [id number] -SLP Stop Time (ACUTE ONLY): 1300 SLP Time Calculation (min) (ACUTE ONLY): 25 min Past Medical History: Past Medical History: Diagnosis Date . Abdominal distention   past two months . Abnormality of gait 10/28/2015 . Aphasia due to acute stroke ([place])  . 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 Arthritis   right knee . Benign essential HTN 11/19/2013 . CAP (community acquired pneumonia) 09/07/2013 . CHF (congestive heart failure) ([place])  . Chronic diastolic heart failure ([place]) 11/19/2013 . Chronic kidney disease   STAGE III KIDNEY DISEASE-PER PT--PT SEES DR. Risa Grill  UROLOGIST . Complication of anesthesia   PT REMEMBERS BREATHING PROBLEMS WAKING UP FROM KNEE SURGERY AT [facility] 2011 OR 2012 . Coronary artery calcification seen on CT scan 01/10/2017 . Fever of unknown origin 09/04/2013 . GERD (gastroesophageal reflux disease)  . Headache  . Headache(784.0)  . 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 Hypercholesteremia  . Hypertension  . Hypertensive heart disease with heart failure ([place]) 01/10/2017 . Hypothyroidism  . Hypoxia 09/04/2013 . Leukocytosis 09/04/2013 . Mass of ovary 10/26/2011 . OSA (obstructive sleep apnea) 11/19/2013 . Pneumonia  . Shortness of breath   WITH EXERTION . Sinus drainage   PT STARTED ANTIBIOTIC 11/17/11 --IS HOARSE TODAY, SOME WHEEZING AND COUGH WITH YELLOW DRAINAGE . SIRS (systemic inflammatory response syndrome) ([place]) 09/04/2013 . SOB (shortness of breath) 09/04/2013 . Stroke ([place]) 02/2007, 08/2009  2 total -RESIDUAL WEAKNESS LEFT LEG--AND APHASIA . Stroke ([place]) 09/01/2017 . Stroke ([place]) 09/08/2017 . Tumor of ovary  . Urinary incontinence  . Urinary incontinence  . Weakness  of left leg   Mildly, post stroke Past Surgical History: Past Surgical History: Procedure Laterality Date . ABDOMINAL HYSTERECTOMY   . CHOLECYSTECTOMY   . IR CT HEAD LTD  09/11/2019 . IR PERCUTANEOUS ART THROMBECTOMY/INFUSION INTRACRANIAL INC DIAG ANGIO  09/11/2019 . LAPAROTOMY  11/22/2011  Procedure: EXPLORATORY LAPAROTOMY;  Surgeon: Imagene Gurney A. Alycia Rossetti, MD;  Location: [facility];  Service: Gynecology;  Laterality: N/A; . LOOP RECORDER INSERTION N/A 09/16/2019  Procedure: LOOP RECORDER INSERTION;  Surgeon: Evans Lance, MD;  Location: [facility];  Service: Cardiovascular;  Laterality: N/A; . MENISCUS REPAIR   . RADIOLOGY WITH ANESTHESIA N/A 09/11/2019  Procedure: IR WITH ANESTHESIA;  Surgeon: Radiologist, Medication, MD;  Location: [facility];  Service: Radiology;  Laterality: N/A; . SALPINGOOPHORECTOMY  11/22/2011  Procedure: SALPINGO OOPHERECTOMY;  Surgeon: Imagene Gurney A. Alycia Rossetti, MD;  Location: [facility];  Service: Gynecology;  Laterality: Bilateral; . TOE SURGERY   . TONSILLECTOMY   . TUBAL LIGATION   HPI: April Le is a 78 y.o. female with past medical history significant for congestive heart failure(chronic diastolic heart failure), prior stroke, hypertension, hyperlipidemia and prior stroke with residual mild left leg weakness and mild aphasia. She presented to the emergency department as a code stroke for left gaze deviation, right-sided weakness and aphasia on 09/11/19 when she was found non-verbal with right side weakness and facial droop. Stat CT head was obtained which showed no acute hemorrhage/infarct.  CT head did demonstrate old infarcts most notably left MCA stroke and R PCA stroke.  CT angiogram and CT perfusion showed left M1 occlusion. Intubated briefly for arteriogram and TICI revascularization.   No data recorded Assessment / Plan / Recommendation CHL IP CLINICAL IMPRESSIONS 09/26/2019 Clinical Impression Pt presents with siginficant oral phase dysphagia that results in decreased containment of nectar thick  liquids and thin liquids. As a result, pt has premature spillage of both nectar and thin liquids to pyriform sinuses before swallow initiation occurs. During the swallow both consistencies are penetrated, however nectar thick liquids are not penetrated as often and are not penetrated as deeply as thin liquids. On several occasions, thin liquids were penetrated to the level of the cords with some accumulation of the residuals noted. Poor oral control also results in more oral residue with nectar thick and thin liquids that contributes to pharyngeal residue post swallow in the vallucula and pyriform sinuses. Even with time lapse and pt talking, pharyngeal residuals were not aspirated.  Pt also observed consuming 5 cc of thin liquids and 10 cc of thin liquids for potential use of Provale cup. Pt would be appropriate for water protocol via spoon but not 10 cc amounts. Pt actually demonstrated better bolus cohesion, AP transit and more timely swallow initiation with puree and dysphagia 2 with much less oropharyngeal residue. At this time, recommend initiating dysphagia 2 with nectar thick liquids, medicine crushed in puree. Although pt demonstrates better oral control of dysphagia 2, pt does have fairly significant right facial that might impact consumption of full tray of dysphagia 2 textures. SLP Visit Diagnosis Dysphagia, oropharyngeal phase (R13.12) Attention and concentration deficit following -- Frontal lobe and executive function deficit following -- Impact on safety and function Mild aspiration risk   CHL IP TREATMENT RECOMMENDATION 09/26/2019 Treatment Recommendations Therapy as outlined in treatment plan below   Prognosis 09/12/2019 Prognosis for Safe Diet Advancement Good Barriers to Reach Goals Time post onset Barriers/Prognosis Comment -- CHL IP DIET RECOMMENDATION 09/26/2019 SLP Diet Recommendations Dysphagia 2 (Fine chop) solids;Nectar thick liquid Liquid Administration via Cup Medication Administration  Crushed with puree Compensations Minimize environmental distractions;Slow rate;Small sips/bites Postural Changes Seated upright at 90 degrees   CHL IP OTHER RECOMMENDATIONS 09/26/2019 Recommended Consults -- Oral Care Recommendations Oral care BID Other Recommendations Order thickener from pharmacy;Remove water pitcher;Have oral suction available;Prohibited food (jello, ice cream, thin soups)   CHL IP FOLLOW UP RECOMMENDATIONS 09/16/2019 Follow up Recommendations Inpatient Rehab   CHL IP FREQUENCY AND DURATION 09/12/2019 Speech Therapy Frequency (ACUTE ONLY) min 2x/week Treatment Duration 2 weeks      CHL IP ORAL PHASE 09/26/2019 Oral Phase Impaired Oral - Pudding Teaspoon -- Oral - Pudding Cup -- Oral - Honey Teaspoon -- Oral - Honey Cup -- Oral - Nectar Teaspoon Holding of bolus;Premature spillage;Decreased bolus cohesion;Delayed oral transit;Weak lingual manipulation Oral - Nectar Cup Weak lingual manipulation;Premature spillage;Decreased bolus cohesion;Delayed oral transit;Holding of bolus Oral - Nectar Straw -- Oral - Thin Teaspoon Weak lingual manipulation;Holding of bolus;Delayed oral transit;Decreased bolus cohesion;Premature spillage Oral - Thin Cup Weak lingual manipulation;Holding of bolus;Premature spillage;Decreased bolus cohesion;Delayed oral transit Oral - Thin Straw -- Oral - Puree WFL Oral - Mech Soft WFL Oral - Regular -- Oral - Multi-Consistency -- Oral - Pill -- Oral Phase - Comment --  CHL IP PHARYNGEAL PHASE 09/26/2019 Pharyngeal Phase Impaired Pharyngeal- Pudding Teaspoon -- Pharyngeal -- Pharyngeal- Pudding Cup -- Pharyngeal -- Pharyngeal- Honey Teaspoon -- Pharyngeal -- Pharyngeal- Honey Cup -- Pharyngeal -- Pharyngeal- Nectar Teaspoon Delayed swallow initiation-pyriform sinuses;Penetration/Aspiration during swallow;Pharyngeal residue - valleculae;Pharyngeal residue - pyriform Pharyngeal Material enters airway, remains ABOVE vocal cords then ejected out Pharyngeal- Nectar Cup Delayed swallow  initiation-pyriform sinuses;Penetration/Aspiration during swallow;Pharyngeal residue - pyriform;Pharyngeal residue - valleculae Pharyngeal Material enters airway, remains ABOVE vocal cords then ejected out Pharyngeal- Nectar Straw -- Pharyngeal -- Pharyngeal- Thin Teaspoon Delayed swallow initiation-pyriform sinuses;Penetration/Aspiration during swallow;Pharyngeal residue - valleculae;Pharyngeal residue - pyriform Pharyngeal Material enters airway, remains ABOVE vocal cords then ejected out Pharyngeal- Thin Cup Delayed swallow initiation-pyriform sinuses;Penetration/Aspiration during swallow;Pharyngeal residue - valleculae;Pharyngeal residue - pyriform Pharyngeal Material enters airway, remains ABOVE vocal cords and not ejected out;Material enters airway, remains ABOVE vocal cords then ejected out Pharyngeal- Thin Straw -- Pharyngeal -- Pharyngeal- Puree WFL Pharyngeal -- Pharyngeal- Mechanical Soft WFL Pharyngeal -- Pharyngeal- Regular -- Pharyngeal -- Pharyngeal- Multi-consistency -- Pharyngeal -- Pharyngeal- Pill -- Pharyngeal -- Pharyngeal Comment --  CHL IP CERVICAL ESOPHAGEAL PHASE 09/26/2019 Cervical Esophageal Phase WFL Pudding Teaspoon -- Pudding Cup -- Honey  Teaspoon -- Honey Cup -- Nectar Teaspoon -- Nectar Cup -- Nectar Straw -- Thin Teaspoon -- Thin Cup -- Thin Straw -- Puree -- Mechanical Soft -- Regular -- Multi-consistency -- Pill -- Cervical Esophageal Comment -- April Le 09/26/2019, 1:05 PM              Ir Percutaneous Art Thrombectomy/infusion Intracranial Inc Diag Angio  Result Date: 09/12/2019 INDICATION: New onset of left gaze deviation, aphasia, and right-sided weakness. Occluded left middle cerebral artery proximally on CT angiogram of the head and neck. EXAM: 1. EMERGENT LARGE VESSEL OCCLUSION THROMBOLYSIS (anterior CIRCULATION) COMPARISON:  CT angiogram of the head and neck of September 11, 2019. MEDICATIONS: Vancomycin 1 g IV antibiotic was administered within 1 hour of the  procedure. ANESTHESIA/SEDATION: General anesthesia. CONTRAST:  Isovue 300 approximately 60 mL. FLUOROSCOPY TIME:  Fluoroscopy Time: 36 minutes 0 seconds (1498 mGy). COMPLICATIONS: None immediate. TECHNIQUE: Following a full explanation of the procedure along with the potential associated complications, an informed witnessed consent was obtained from the spouse and patient's daughter. The risks of intracranial hemorrhage of 10%, worsening neurological deficit, ventilator dependency, death and inability to revascularize were all reviewed in detail with the patient's spouse and daughter. The patient was then put under general anesthesia by the Department of Anesthesiology at [facility]. The right groin was prepped and draped in the usual sterile fashion. Thereafter using modified Seldinger technique, transfemoral access into the right common femoral artery was obtained without difficulty. Over a 0.035 inch guidewire a 5 French Pinnacle sheath was inserted. Through this, and also over a 0.035 inch guidewire a 5 [place] JB 1 catheter was advanced to the aortic arch region and selectively positioned in the left common carotid artery. FINDINGS: The left common carotid arteriogram demonstrates the left external carotid artery and its major branches to be widely patent. The left internal carotid artery at the bulb to the cranial skull base demonstrates wide patency. The petrous, cavernous and the supraclinoid segments are widely patent. A small infundibulum is seen at the origin of the left posterior communicating artery. The left anterior cerebral artery opacifies into the capillary and venous phases. The left middle cerebral artery just distal to the anterior temporal branch demonstrates complete occlusion. PROCEDURE: The diagnostic JB 1 catheter in the left common carotid artery was then exchanged over a 0.035 300 cm Rosen exchange guidewire for an 8 [place] Pinnacle sheath which was then connected to continuous  heparinized saline infusion. Over the [facility] exchange guidewire, a 95 cm 087 Qapel balloon guide catheter which had been prepped with 50% contrast and 50% heparinized saline infusion was advanced and positioned just proximal to the left common carotid bifurcation. The guidewire was removed. Good aspiration obtained from the hub of the balloon guide catheter. A gentle control arteriogram performed through this demonstrated no change in the intracranial or the extracranial circulation. Over a 0.014 inch Softip Synchro micro guidewire, a combination of an 021 Trevo ProVue microcatheter inside of a 6 French 132 cm Catalyst guide catheter was advanced without difficulty to the supraclinoid left ICA. With the micro guidewire leading with a J-tip configuration to avoid dissections or inducing spasm, the combination was navigated to the proximal left middle cerebral artery. Using a torque device the micro guidewire was then advanced to the distal M2 M3 region of the inferior division followed by the microcatheter. The guidewire was removed. Good aspiration was obtained from the hub of the microcatheter. Gentle control arteriogram performed through the microcatheter demonstrated safe  position of tip of the microcatheter. This was then connected to continuous heparinized saline infusion. A 5 mm x 33 mm Embotrap retrieval device was then advanced to the distal end of the microcatheter. With the O ring on the delivery microcatheter loosened, and slight forward traction with the right hand on the delivery micro guidewire, the distal and then the proximal portion of the device was then deployed. At this time the 6 [place] Catalyst guide catheter was advanced in order to engage the proximal portion of the clot. With proximal flow arrest in the right internal carotid artery with balloon catheter having been advanced into the proximal 1/3, and constant aspiration being applied with a 60 mL syringe at the hub of the balloon guide  catheter and aspiration applied with a Penumbra aspiration device at the hub of the 6 [place] Catalyst guide catheter for approximately 2-1/2 minutes, the combination of the retrieval device, the microcatheter and the 6 [place] Catalyst guide catheter was retrieved and removed. Aspiration was continued whilst the balloon was deflated in the left internal carotid artery. A few specks of clot were seen entangled in the cells of the retrieval device. A control arteriogram performed through the balloon guide catheter in the proximal left internal carotid artery demonstrated slight proximal migration of the clot compared to previously. A second pass was then made again using the above combination. After having ascertained safe position of tip of the microcatheter in the M2 M3 region of the inferior division, and having verified the safe tip of the microcatheter, a 4 mm x 40 mm Solitaire X retrieval device was advanced to the distal end of the microcatheter. The O ring on the delivery microcatheter was then loosened. With slight forward gentle traction with the right hand on the delivery micro guidewire, with the left hand the delivery microcatheter was retrieved unsheathing the retrieval device. The 6 [place] Catalyst guide catheter was now advanced further into the clot of the left middle cerebral artery. Proximal flow arrest was then initiated by inflating the balloon guide catheter in the mid 1/3 of the left internal carotid artery. Constant aspiration with a Penumbra aspiration device was then initiated at the hub of the Catalyst guide catheter for approximately 2 minutes. Following this, the combination of the retrieval device, the microcatheter and the 6 [place] Catalyst guide catheter were then retrieved and removed as constant aspiration was applied at the hub of the balloon guide catheter with deflation of the balloon in the left internal carotid artery. Free back bleed of blood was seen at the hub of the balloon  guide catheter. A control arteriogram performed through the balloon guide catheter in the left internal carotid artery demonstrated complete revascularization of the left middle cerebral artery achieving a TICI 3 revascularization. The left anterior cerebral artery remained widely patent as well. Wide patency of the left internal carotid artery extra cranially was maintained with mild spasm at the distal 1/3 of the left internal carotid artery. The balloon guide catheter was retrieved and removed. The 8 French Pinnacle sheath in the right groin was then removed with the successful application of an 8 [place] Angio-Seal closure device. The right groin appeared soft without evidence of a hematoma or bleeding. Distal pulses in the dorsalis pedis, and the posterior tibial regions remained palpable unchanged. A flat panel CT of the brain revealed no evidence of an intracranial hemorrhage, or mass effect or midline shift. The patient's general anesthesia was then reversed and the patient extubated. Upon recovery, the  patient was able to move her left arm and leg, and bend her right knee spontaneously and to command. She was then transferred to the PACU and then neuro ICU for post thrombectomy management. IMPRESSION: Status post endovascular complete revascularization of occluded left middle cerebral artery M1 segment with 1 pass with the Embotrap 5 mm x 33 mm retrieval device, and 1 pass with the Solitaire 4 mm x 40 mm X retrieval device, and adjunct Penumbra aspiration achieving a TICI 3 revascularization. PLAN: Follow-up in clinic 4 weeks post discharge. Electronically Signed   By: Luanne Bras M.D.   On: 09/11/2019 11:53   Ct Head Code Stroke Wo Contrast  Result Date: 09/18/2019 CLINICAL DATA:  Code stroke. Focal neuro deficit, less than 6 hours, stroke suspected. Patient unresponsive beginning 1 hour ago. Witnessed seizure. EXAM: CT HEAD WITHOUT CONTRAST TECHNIQUE: Contiguous axial images were obtained from  the base of the skull through the vertex without intravenous contrast. COMPARISON:  CT head without contrast 09/11/2019. MR head without contrast 09/12/2019. FINDINGS: Brain: Evolving left MCA territory infarct is again noted. There is pseudonormalization of the cortex over the left hemisphere. There is no significant expansion of the infarct territory. There is loss of distinction in the left lentiform nucleus and internal capsule. White matter changes are present on the right. Right basal ganglia and insular cortex is normal. Remote right occipital infarct is again seen. Brainstem and cerebellum are normal. Vascular: Atherosclerotic changes are noted within the cavernous internal carotid arteries. There is no hyperdense vessel. Skull: Calvarium is intact. No focal lytic or blastic lesions are present. Sinuses/Orbits: The paranasal sinuses and mastoid air cells are clear. The globes and orbits are within normal limits. ASPECTS [facility] Stroke Program Early CT Score) - Ganglionic level infarction (caudate, lentiform nuclei, internal capsule, insula, M1-M3 cortex): 7/7 - Supraganglionic infarction (M4-M6 cortex): 3/3 Total score (0-10 with 10 being normal): 10/10 IMPRESSION: 1. Expected evolution of left MCA territory infarct without significant expansion of the infarct territory. 2. No acute hemorrhage. 3. Stable diffuse white matter disease. 4. Stable remote right occipital lobe infarct. 5. ASPECTS is 10/10 The above was relayed via text pager to Dr. Rosalin Hawking on 09/18/2019 at 16:12 . Electronically Signed   By: San Morelle M.D.   On: 09/18/2019 16:15   Ct Head Code Stroke Wo Contrast  Result Date: 09/11/2019 CLINICAL DATA:  Code stroke.  Generalized muscle weakness EXAM: CT HEAD WITHOUT CONTRAST TECHNIQUE: Contiguous axial images were obtained from the base of the skull through the vertex without intravenous contrast. COMPARISON:  Brain MRI 09/08/2017 FINDINGS: Brain: No evidence of acute infarction,  hemorrhage, hydrocephalus, extra-axial collection or mass lesion/mass effect. Moderate remote left MCA branch infarct affecting the lateral frontal lobe. Moderate remote right occipital infarct. Extensive chronic small vessel ischemic gliosis in the cerebral white matter. Small remote right cerebellar infarct Vascular: Hyperdense left distal M1 segment extending into the M2 branches. Skull: Normal. Negative for fracture or focal lesion. Sinuses/Orbits: No acute finding. Other: These results were communicated to Dr. Lorraine Lax at 6:34 amon 10/28/2020by text page via the [facility] messaging system. ASPECTS [facility] Stroke Program Early CT Score) Not scored given the extent of chronic change. IMPRESSION: 1. Hyperdense distal left M1 segment suggesting acute thrombosis. No hemorrhage or visible acute infarct. There has been a remote moderate left MCA branch infarct at the lateral frontal lobe. 2. Advanced chronic small vessel ischemia in the cerebral white matter. There is also been remote right occipital infarct. Electronically Signed  By: Monte Fantasia M.D.   On: 09/11/2019 06:36   Vas [place] Lower Extremity Venous (dvt)  Result Date: 09/14/2019  Lower Venous Study Indications: Stroke.  Limitations: Confusion. Comparison Study: No prior study on file for comparison Performing Technologist: Sharion Dove RVS  Examination Guidelines: A complete evaluation includes B-mode imaging, spectral Doppler, color Doppler, and power Doppler as needed of all accessible portions of each vessel. Bilateral testing is considered an integral part of a complete examination. Limited examinations for reoccurring indications may be performed as noted.  +---------+---------------+---------+-----------+----------+--------------+ RIGHT    CompressibilityPhasicitySpontaneityPropertiesThrombus Aging +---------+---------------+---------+-----------+----------+--------------+ CFV      Full           Yes      Yes                                  +---------+---------------+---------+-----------+----------+--------------+ SFJ      Full                                                        +---------+---------------+---------+-----------+----------+--------------+ FV Prox  Full                                                        +---------+---------------+---------+-----------+----------+--------------+ FV Mid   Full                                                        +---------+---------------+---------+-----------+----------+--------------+ FV DistalFull                                                        +---------+---------------+---------+-----------+----------+--------------+ PFV      Full                                                        +---------+---------------+---------+-----------+----------+--------------+ POP      Full           Yes      Yes                                 +---------+---------------+---------+-----------+----------+--------------+ PTV      Full                                                        +---------+---------------+---------+-----------+----------+--------------+ PERO     Full                                                        +---------+---------------+---------+-----------+----------+--------------+   +---------+---------------+---------+-----------+----------+-------------------+  LEFT     CompressibilityPhasicitySpontaneityPropertiesThrombus Aging      +---------+---------------+---------+-----------+----------+-------------------+ CFV      Full           Yes      Yes                                      +---------+---------------+---------+-----------+----------+-------------------+ SFJ      Full                                                             +---------+---------------+---------+-----------+----------+-------------------+ FV Prox  Full                                                              +---------+---------------+---------+-----------+----------+-------------------+ FV Mid   Full                                                             +---------+---------------+---------+-----------+----------+-------------------+ FV DistalFull                                                             +---------+---------------+---------+-----------+----------+-------------------+ PFV      Full                                                             +---------+---------------+---------+-----------+----------+-------------------+ POP                     Yes      Yes                  patent by color and                                                       Doppler             +---------+---------------+---------+-----------+----------+-------------------+ PTV      Full                                                             +---------+---------------+---------+-----------+----------+-------------------+ PERO     Full                                                             +---------+---------------+---------+-----------+----------+-------------------+  Summary: Right: There is no evidence of deep vein thrombosis in the lower extremity. Left: There is no evidence of deep vein thrombosis in the lower extremity.  *See table(s) above for measurements and observations. Electronically signed by Deitra Mayo MD on 09/14/2019 at 5:56:00 PM.    Final    [place] Ekg Site Rite  Result Date: 09/30/2019 If Site Rite image not attached, placement could not be confirmed due to current cardiac rhythm.  [place] Ekg Site Rite  Result Date: 09/25/2019 If Site Rite image not attached, placement could not be confirmed due to current cardiac rhythm.   Labs:  Basic Metabolic Panel: Recent Labs  Lab 10/04/19 0637 10/07/19 0640 10/08/19 0508  NA 146* 148*  --   K 3.7 4.0  --   CL 110 110  --   CO2 24 26  --   GLUCOSE 139* 153*  --   BUN 29* 41*  --    CREATININE 1.30* 1.42* 1.45*  CALCIUM 9.3 9.5  --     CBC: No results for input(s): WBC, NEUTROABS, HGB, HCT, MCV, PLT in the last 168 hours.  CBG: No results for input(s): GLUCAP in the last 168 hours.  Family history.  Mother with kidney failure.  Father with CAD and CHF.  Maternal grandmother with stomach cancer as well as CVA.  Denies any diabetes mellitus or rectal cancer  Brief HPI:   April Le is a 78 y.o. 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  Per chart review she lives with spouse 2 level home.  Independent with assistive device.  Presented 09/11/2019 nonverbal with right-sided weakness.  SARS coronavirus negative, creatinine 1.25, hemoglobin 11.8, WBC 10,700.  Cranial CT scan showed no hemorrhage or visible acute infarction.  Patient did not receive TPA.  CT angiogram of head and neck showed left distal M1 embolism extending into the proximal M2 branch with poorly enhancing downstream vessels.  Remote moderate left lateral frontal and upper insular infarction.  CT perfusion showed a 38 cc acute infarction at a 56 cc of superimposed penumbra.  Underwent cerebral angiogram with complete revascularization of occluded left MCA M1 segment.  MRI follow-up showed acute cortical infarct involving majority of left MCA distribution.  There was patchy petechial hemorrhage.  Echocardiogram with ejection fraction of 65%.  Lower extremity Dopplers negative for DVT.  Neurology follow-up maintained on aspirin 325 mg daily as well as Plavix.  Loop recorder placed.  Remained on Lovenox for DVT prophylaxis.  She is initially n.p.o. with nasogastric tube feeds.  She had episodes of sundowning maintained on Aricept as well as Seroquel.  Patient with episodes of shortness of breath respiratory 26  oxygen saturation 97% chest x-ray no active disease she did receive an extra dose of Lasix troponin was negative.  Therapy evaluations completed patient was admitted for a comprehensive rehab program   Hospital Course: Mossie Wowk was admitted to rehab 09/17/2019 for inpatient therapies to consist of PT, ST and OT at least three hours five days a week. Past admission physiatrist, therapy team and rehab RN have worked together to provide customized collaborative inpatient rehab.  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  She remained on aspirin and Plavix therapy per neurology services status post loop  recorder placement she would follow-up with neurology services.  Subcutaneous Lovenox for DVT prophylaxis venous Doppler studies negative.  Mood stabilization with the use of Aricept she was receiving Seroquel at nighttime titrated accordingly.  She exhibited no other signs of fluid overload she continued on Lasix as advised with follow-up cardiology services.  AKI versus CKD stage III latest creatinine 1.30 and stable.  Blood pressure controlled on lisinopril as well as Lopressor.  She would follow-up with her primary MD.  Post stroke seizure complex partial EEG negative Keppra 500 mg twice daily followed by neurology services.  Her diet was advanced to mechanical soft nectar liquid close monitoring of hydration.   Blood pressures were monitored on TID basis and remained soft but stable   /She has made gains during rehab stay and is attending therapies  April Le will continue to receive follow up therapies after discharge  Rehab course: During patient's stay in rehab weekly team conferences were held to monitor patient's progress, set goals and discuss barriers to discharge. At admission, patient required moderate assist ambulate 40 feet to person hand-held assist, minimal assist sit  to stand, minimal assist sit to supine.  Moderate assist upper body bathing total assist lower body bathing moderate assist toilet transfers  Physical exam.  Blood pressure 132/77 pulse 61 temperature 98.4 respirations 20 oxygen saturation 95% room air Constitutional.  Elderly female sitting up in chair husband at bedside. HEENT Head.  Normocephalic and atraumatic Eyes.  Pupils sluggish but reactive to light no nystagmus without discharge.  Right facial droop. Neck.  Supple nontender no JVD no tracheal deviation Cardiac.  Regular rate rhythm without murmur extra sounds or rub GI.  Soft nontender positive bowel sounds without rebound Musculoskeletal Comments.  She was able to grip hands bilaterally through her mittens wiggle her toes when asked but unable to either understand or comply when asked to lift her leg or arm.  Patient global versus receptive aphasia.  April Le  has had improvement in activity tolerance, balance, postural control as well as ability to compensate for deficits. April Le has had improvement in functional use RUE/LUE  and RLE/LLE as well as improvement in awareness.  Working with energy conservation techniques family teaching.  Bed mobility with visual and verbal cues supervision.  Basic car transfers stand pivot with moderate assist.  Patient unable to stand up from ultra hemiwheelchair due to difficulty scooting forward.  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  Ambulates 10 feet on level tile with moderate assist.  Family was instructed to use bedside commode for toileting and sponge bathing.  Patient did need verbal cues for use of swallowing strategies.  Full family teaching completed plan discharge to home       Disposition: Discharged to home   Diet: Dysphagia #3 nectar thick liquids.  Special Instructions: No smoking or alcohol  Medications at discharge 1.  Tylenol as needed 2.  Aspirin 325 mg p.o. daily 3.  Lipitor 20 mg p.o. daily 4.  Plavix 75 mg p.o. daily 5.  Aricept 5 mg p.o.  daily 6.  Lasix 40 mg p.o. twice daily 7.  Keppra 500 mg p.o. twice daily 8.  Synthroid 50 mcg p.o. daily 9.  Zestril 2.5 mg p.o. daily 10.  Lopressor 50 mg p.o. twice daily 11.  Klor-Con 20 mEq p.o. daily 12.  Seroquel 25 mg p.o. twice daily  Discharge Instructions    Ambulatory referral to Neurology   Complete by: As directed    An appointment is requested in approximately 4 weeks  left MCA infarction   Ambulatory referral to Physical Medicine Rehab   Complete by: As directed    Moderate complexity follow up 1-2 weeks left MCA infarction      Follow-up Information    Kirsteins, Luanna Salk, MD Follow up.   Specialty: Physical Medicine and Rehabilitation Why: Office to call for appointment Contact information: [place] [address] [phone]        Luanne Bras, MD Follow up.   Specialties: Interventional Radiology, Radiology Why: Call for appointment Contact information: [place] [address] [phone]        Fay Records, MD Follow up.   Specialty: Cardiology Why: Call for appointment Contact information: [place] [address] [phone]        [facility]. Schedule an appointment as soon as possible for a visit in 4 week(s).   Specialty: Neurology Contact information: [address] [place] [phone]          Signed: Cathlyn Parsons 10/09/2019, 6:50 AM

## 2019-10-07 NOTE — Progress Notes (Addendum)
Physical Therapy Session Note  Patient Details  Name: April Le MRN: [id number] Date of Birth: Sep 14, 1941  Today's Date: 10/07/2019 PT Individual Time: [id number]; [id number] PT Individual Time Calculation (min): 43 min , 32 min  Short Term Goals:  Week 3:  PT Short Term Goal 1 (Week 3): STG=LTG due to ELOS Week 4:     Skilled Therapeutic Interventions/Progress Updates:  tx 1:  Husband here for family ed.  Pt had no S/S of pain.  Pt in bed, with dry brief and non slip socks on.  PT intsructed husband in bed mobility, basic and car transfers to 22" high seat (estimated by husband).  Bed mobility with visual and VCs, supervision.  Basic and car transfers stand pivot, with mod assist.    Pt was unable to stand up from ultra hemi w/c , due to difficulty scooting forward.  PT raised seat  Height with 2 pillows to facilitate sit>  Stand. Apraxia limits pt, but she generally understands and performs familiar movements with gestures and tactile cues.  Gait training with PT, on level tile x 10' with 2 turns, no AD, mod assist for intermittent posterior lean.  Pt benefits from helper in front of her, with her hands on helpers upper arms.  PT advised husband not to try ambulating with her until cleared by HHPT. Husband stated that pt has ambulated with RW too far in front of her since she began using one years ago after a previous CVa.   PT recommends that a PT do car transfer on day of d/c, and that family bring her transport chair out of the house to car, for exiting car and entering house.  Husband stated that there is a low step which he can manage iwht her in the transport chair.  Husband would benefit from more practice.  He plans to be here tomorrow afternoon for further ed.  At end of session, pt seated in w /c with needs at hand and seat belt alarm set.  tx 2:  Pt in bed, resting.  No S/S of pain during session.  Supine> sit in flat bed, no rails, with min assist.  CGA to scoot  toward EOB.  Sit> stand with min assist; mod assist to regular -height w/c with HHA.  Gait training with grocery cart, on level tile, x 40' straight trajectory, min assist increasing to mod assist and mod cues as pt tends to push cart too far forward as she fatigues.  Up/down (8) 3" high steps, bil rails, min assist to ascend with step through pattern, mod assist to descend with step through pattern.  neuromuscular re-education via multimodal cues for propelling regular height w/c using bil UEs, x 25' with supervision.  Min assist for turns.   At end of session, pt seated in wc with needs at hand and seat belt alarm set.      Therapy Documentation Precautions:  Precautions Precautions: Fall Precaution Comments: cortrak Restrictions Weight Bearing Restrictions: No      Therapy/Group: Individual Therapy  Harvy Riera 10/07/2019, 9:58 AM

## 2019-10-07 NOTE — Progress Notes (Signed)
Occupational Therapy Session Note  Patient Details  Name: April Le MRN: [id number] Date of Birth: 1941-05-19  Today's Date: 10/07/2019 OT Individual Time: 1100-1159 OT Individual Time Calculation (min): 59 min   Short Term Goals: Week 3:  OT Short Term Goal 1 (Week 3): STGs=LTGs secondary to upcoming discharge  Skilled Therapeutic Interventions/Progress Updates:    Pt greeted in w/c with no s/s pain. Appearing very lethargic needing vcs to keep eyes open. Family present for family education. Discussed OT recommendations of using BSC for toileiting and sponge bathing only until progressing to bathroom toilet and showering with HHOT. Reiterated this multiple times during session with spouse April Le and dtr April Le verbalizing understanding. Had April Le set up w/c transfer to [facility] including mgt of leg rests. He required vcs for stand pivot setup. April Le attempted to transfer pt afterwards but she initiated sitting back down after standing due to fatigue and shook her head. Discussed with family timed toileting, frequent brief checks for skin integrity, and using the RW so pt can stand safely when they assist with perihygiene and clothing mgt. They've already purchased Depends for her. Discussed using tactile, demonstrational, and visual cuing during self care tasks to increase pts functional independence. Also reviewed backwards chaining. OT demonstrated these techniques while pt completed handwashing at the sink and lotion application after. Pt still very fatigued at this time and would not stand at the sink despite being given max multimodal cuing. April Le reports the w/c can fit in front of their kitchen sink for sponge bathing. Discussed using the sink or shower caps for hair washing at this time. April Le reports her friend is a Tax adviser and will problem solve hair washing at d/c. Educated family on bed alarm pad and bedrails to place under mattress to increase pts safety during sleep.  They were very receptive to this education. Also emphasized importance of giving pt meaningful activity during the day, such as folding laundry, coloring cards/pictures, or completing other simple aspects of IADLs. Openly discussed safety implications of pts ideational apraxia with family aware that she tries to put inappropriate ADL items in her mouth. Left pt comfortably in w/c with family still present. No further questions for OT. Left them to speak with SW. Relayed needed OT DME (3:1) to SW.    Recommended to family stand pivotting with pt without device vs with device to increase pts safety.   Therapy Documentation Precautions:  Precautions Precautions: Fall Precaution Comments: Aphasic, apraxic Restrictions Weight Bearing Restrictions: No Vital Signs: Therapy Vitals Temp: 98.5 F (36.9 C) Pulse Rate: 64 Resp: 16 BP: 110/62 Patient Position (if appropriate): Lying Oxygen Therapy SpO2: 92 % O2 Device: Room Air Pain: Pain Assessment Pain Scale: Faces Faces Pain Scale: No hurt ADL: ADL Eating: Supervision/safety Where Assessed-Eating: Wheelchair Grooming: Supervision/safety Where Assessed-Grooming: Sitting at sink Upper Body Bathing: Supervision/safety Where Assessed-Upper Body Bathing: Sitting at sink Lower Body Bathing: Moderate assistance Where Assessed-Lower Body Bathing: Standing at sink, Sitting at sink Upper Body Dressing: Minimal assistance Where Assessed-Upper Body Dressing: Sitting at sink Lower Body Dressing: Moderate assistance Where Assessed-Lower Body Dressing: Sitting at sink, Standing at sink Toileting: Moderate assistance Where Assessed-Toileting: Glass blower/designer: Psychiatric nurse Method: Arts development officer: Radiographer, therapeutic: Not assessed     Therapy/Group: Individual Therapy  Valicia Rief A Maxtyn Nuzum 10/07/2019, 4:06 PM

## 2019-10-08 ENCOUNTER — Inpatient Hospital Stay (HOSPITAL_COMMUNITY): Payer: Medicare Other

## 2019-10-08 ENCOUNTER — Inpatient Hospital Stay (HOSPITAL_COMMUNITY): Payer: Medicare Other | Admitting: Speech Pathology

## 2019-10-08 ENCOUNTER — Inpatient Hospital Stay (HOSPITAL_COMMUNITY): Payer: Medicare Other | Admitting: Physical Therapy

## 2019-10-08 ENCOUNTER — Inpatient Hospital Stay (HOSPITAL_COMMUNITY): Payer: Medicare Other | Admitting: Occupational Therapy

## 2019-10-08 ENCOUNTER — Ambulatory Visit (HOSPITAL_COMMUNITY): Payer: Medicare Other | Admitting: Physical Therapy

## 2019-10-08 LAB — CREATININE, SERUM
Creatinine, Ser: 1.45 mg/dL — ABNORMAL HIGH (ref 0.44–1.00)
GFR calc Af Amer: 40 mL/min — ABNORMAL LOW (ref >60)
GFR calc non Af Amer: 34 mL/min — ABNORMAL LOW (ref >60)

## 2019-10-08 NOTE — Progress Notes (Signed)
Occupational Therapy Session Note  Patient Details  Name: April Le MRN: [id number] Date of Birth: Sep 24, 1941  Today's Date: 10/08/2019 OT Individual Time: 1015-1045 OT Individual Time Calculation (min): 30 min    Short Term Goals: Week 2:  OT Short Term Goal 1 (Week 2): Pt will perform bathing tasks with mod A overall. OT Short Term Goal 1 - Progress (Week 2): Met OT Short Term Goal 2 (Week 2): Pt will perform LB dressing with mod A overall. OT Short Term Goal 2 - Progress (Week 2): Met OT Short Term Goal 3 (Week 2): Pt will perform grooming tasks at sink with min A overall. OT Short Term Goal 3 - Progress (Week 2): Not met OT Short Term Goal 4 (Week 2): Pt will perform toileting with min A overall. Week 3:  OT Short Term Goal 1 (Week 3): STGs=LTGs secondary to upcoming discharge  Skilled Therapeutic Interventions/Progress Updates:    Pt resting in w/c at nursing station.  OT intervention with focus on following one step commands and sorting tasks seated at table. Pt presented with bowl of artifical fruit.  Pt unable to name or select correct fruit when challenged. Pt presented with colored peg pattern and colored pegs.  Pt unable to complete even after modeling and HOH assist.  Pt returned to nursing station.  Belt alarm in place.  Therapy Documentation Precautions:  Precautions Precautions: Fall Precaution Comments: Aphasic, apraxic Restrictions Weight Bearing Restrictions: No    Pain:  Pt with no s/s of pain, pt with expressive aphasia  Therapy/Group: Individual Therapy  Leroy Libman 10/08/2019, 12:13 PM

## 2019-10-08 NOTE — Progress Notes (Signed)
Speech Language Pathology Discharge Summary  Patient Details  Name: April Le MRN: [id number] Date of Birth: 19-Dec-1940  Today's Date: 10/08/2019 SLP Individual Time: 1300-1343 SLP Individual Time Calculation (min): 43 min   Skilled Therapeutic Interventions:  Pt was seen for skilled ST targeting dysphagia and communication goals. Despite max encouragement and multimodal cues, pt would only accept 3 bites of dys 3 solid lunch items. She did consume a whole pudding cup prior to therapists' arrival. She also consumed ~8oz nectar thick liquids with Supervision A for use of swallow strategies. No overt s/sx aspiration observed throughout solid or liquid intake. Pt communicated basic wants and needs through use of gestures and one word utterances with Mod A verbal and visual cues, however was perseverative on saying "okay" today. She answered basic biographical yes/no questions with ~50% accuracy, basic environmental <25% accuracy. Pt participated in automatic speech tasks (counting 1-10, days of the week, and singing happy birthday) with ~50% accuracy when provided Max A multimodal cues (models, phonemic cues, melodic intonation, and written aids). Pt with frequent neologisms throughout session. Pt left sitting in wheelchair with seatbelt alarm in place and all needs within reach. Continue per current plan of care.     Patient has met 6 of 6 long term goals.  Patient to discharge at overall Mod level.  Reasons goals not met: n/a   Clinical Impression/Discharge Summary:   Pt made functional gains and met 6 out of 6 long term goals this admission. Pt currently requires Mod assist for multimodal communication of basic wants and needs as well as sustained attention to tasks. Due to the severity of her swallowing and expressive/receptive language deficits, the only cognitive deficit targeted this admission was sustained attention. She will require strict 24/7 supervision and hands on care for  communication, swallowing, and cognition. Pt is consuming Dysphagia 3 (mech soft) diet with nectar thick liquids and is Supervision A for use of swallow strategies. Pt will need repeat outpatient MBSS prior to solid/liquid advancement - change in oropharyngeal swallow function was minimal this admission, although she has significantly improved ability to use swallow strategies. She has also demonstrated improvements in her ability to participate in automatic speech tasks, use of gestures and verbalizations (multimodal means) to communicate basic wants and needs, and ability to follow 1-step directions. However, given significant deficits swallowing, cognitive, and language still present, recommend pt continue to receive skilled [facility] or outpatient ST services upon discharge. Pt and family education is complete at this time.   Care Partner:  Caregiver Able to Provide Assistance: Yes  Type of Caregiver Assistance: Cognitive  Recommendation:  Home Health SLP;Outpatient SLP;24 hour supervision/assistance  Rationale for SLP Follow Up: Maximize functional communication;Maximize swallowing safety;Maximize cognitive function and independence;Reduce caregiver burden   Equipment: none   Reasons for discharge: Discharged from hospital   Patient/Family Agrees with Progress Made and Goals Achieved: Yes    [name] 10/08/2019, 11:55 AM

## 2019-10-08 NOTE — Progress Notes (Signed)
Physical Therapy Discharge Summary  Patient Details  Name: April Le MRN: [id number] Date of Birth: Jan 01, 1941  Today's Date: 10/09/2019 PT Individual Time: 1100-1125 PT Individual Time Calculation (min): 25 min   Patient has met 5 of 9 long term goals due to improved activity tolerance, improved balance, improved postural control, increased strength and ability to compensate for deficits.  Patient to discharge at a wheelchair level [place].   Patient's care partner is independent to provide the necessary physical and cognitive assistance at discharge. Patient's husband Fulton Reek has completed one day of family education and reports feeling safe to assist pt upon d/c home.  Reasons goals not met: Pt is at mod A overall for transfers as well as short distance gait and therefore did not meet min A level goals. Pt fluctuates from CGA to mod A depending on willingness to participate.  Recommendation:  Patient will benefit from ongoing skilled PT services in home health setting to continue to advance safe functional mobility, address ongoing impairments in balance, endurance, strength, safety, independence with functional mobility, cognition, and minimize fall risk.  Equipment: No equipment provided  Reasons for discharge: treatment goals met and discharge from hospital  Patient/family agrees with progress made and goals achieved: Yes   Skilled Intervention: Pt received seated in bed, agreeable to get up to w/c for d/c home. Bed mobility CGA. Stand pivot transfer bed to w/c with mod A for SPT with BUE HHA. Transported pt downstairs to her husband's car. Stand pivot transfer w/c to car with mod A with BUE HHA. Pt left seated in car in care of family for d/c home.  PT Discharge Precautions/Restrictions Precautions Precautions: Fall Precaution Comments: Aphasic, apraxic Restrictions Weight Bearing Restrictions: No Pain Pain Assessment Pain Scale: 0-10 Pain Score: 0-No pain Faces Pain  Scale: No hurt Vision/Perception  Praxis Praxis: Impaired Praxis Impairment Details: Initiation;Ideation;Ideomotor;Perseveration;Motor planning  Cognition Overall Cognitive Status: Impaired/Different from baseline Arousal/Alertness: Awake/alert Orientation Level: Oriented to person Attention: Sustained;Focused Focused Attention: Impaired Sustained Attention: Impaired Memory: Impaired Awareness: Impaired Awareness Impairment: Intellectual impairment Problem Solving: Impaired Behaviors: Perseveration;Impulsive Safety/Judgment: Impaired Sensation Sensation Light Touch: Appears Intact Proprioception: Appears Intact Coordination Gross Motor Movements are Fluid and Coordinated: No Fine Motor Movements are Fluid and Coordinated: No Coordination and Movement Description: apraxic and uncoordinated movements Motor  Motor Motor: Motor apraxia;Abnormal postural alignment and control;Motor impersistence Motor - Skilled Clinical Observations: R hemiparesis Motor - Discharge Observations: R hemiparesis  Mobility Bed Mobility Bed Mobility: Rolling Right;Rolling Left;Supine to Sit;Sit to Supine Rolling Right: Supervision/verbal cueing Rolling Left: Supervision/Verbal cueing Supine to Sit: Supervision/Verbal cueing Sit to Supine: Supervision/Verbal cueing Transfers Transfers: Stand Pivot Transfers Stand Pivot Transfers: Moderate Assistance - Patient 50 - 74% Stand Pivot Transfer Details: Visual cues/gestures for precautions/safety;Visual cues/gestures for sequencing Transfer (Assistive device): 1 person hand held assist Locomotion  Gait Ambulation: Yes Gait Assistance: Moderate Assistance - Patient 50-74% Gait Distance (Feet): 10 Feet Assistive device: 1 person hand held assist Gait Gait: Yes Gait Pattern: Impaired Gait velocity: decreased Stairs / Additional Locomotion Stairs: Yes Stairs Assistance: Moderate Assistance - Patient 50 - 74% Stair Management Technique: Two  rails Number of Stairs: 8 Height of Stairs: 6 Wheelchair Mobility Wheelchair Mobility: Yes Wheelchair Assistance: Chartered loss adjuster: Both upper extremities Wheelchair Parts Management: Needs assistance Distance: 25  Trunk/Postural Assessment  Cervical Assessment Cervical Assessment: Exceptions to WFL(forward head) Thoracic Assessment Thoracic Assessment: Exceptions to WFL(kyphotic) Lumbar Assessment Lumbar Assessment: Exceptions to WFL(posterior pelvic tilt) Postural Control Postural Control: Deficits on evaluation  Balance Balance Balance Assessed: Yes Dynamic Sitting Balance Dynamic Sitting - Balance Support: During functional activity Dynamic Sitting - Level of Assistance: 5: Stand by assistance Static Standing Balance Static Standing - Balance Support: During functional activity Static Standing - Level of Assistance: 4: Min assist Dynamic Standing Balance Dynamic Standing - Balance Support: During functional activity Dynamic Standing - Level of Assistance: 3: Mod assist Extremity Assessment   RLE Assessment RLE Assessment: Exceptions to [facility] General Strength Comments: grossly 4/5, pt unable to follow cues to formally assess LLE Assessment LLE Assessment: Exceptions to [facility] General Strength Comments: grossly 4/5, pt unable to follow cues to formally assess     Excell Seltzer, PT, DPT 10/08/2019, 4:06 PM

## 2019-10-08 NOTE — Progress Notes (Signed)
Social Work Discharge Note   The overall goal for the admission was met for:   Discharge location: [place]  Length of Stay: Yes-22 DAYS  Discharge activity level: Yes-MIN LEVEL OF ASSIST  Home/community participation: Yes  Services provided included: MD, RD, PT, OT, SLP, RN, CM, Pharmacy and SW  Financial Services: Private Insurance: [facility]  Follow-up services arranged: Home Health: [facility]-PT,OT,SP,RN, DME: [facility] and Patient/Family request agency HH: HAS HAD THEM BEFORE, DME: NO PREF  Comments (or additional information):HSBAND AND DAUGHTER WERE HERE FOR EDUCATION ON Monday AND IT WENT WELL. AWARE PT WILL REQUIRE 24 HR CARE AT HOME.   Patient/Family verbalized understanding of follow-up arrangements: Yes  Individual responsible for coordination of the follow-up plan: BENNY-HUSBAND & STEPHANIE-DAUGHTER  Confirmed correct DME delivered: Elease Hashimoto 10/08/2019    Elease Hashimoto

## 2019-10-08 NOTE — Plan of Care (Signed)
  Problem: Consults Goal: RH STROKE PATIENT EDUCATION Description: See Patient Education module for education specifics  Outcome: Progressing   Problem: RH BOWEL ELIMINATION Goal: RH STG MANAGE BOWEL WITH ASSISTANCE Description: STG Manage Bowel with [place]. Outcome: Progressing Goal: RH STG MANAGE BOWEL W/MEDICATION W/ASSISTANCE Description: STG Manage Bowel with Medication with min Assistance. Outcome: Progressing   Problem: RH BLADDER ELIMINATION Goal: RH STG MANAGE BLADDER WITH ASSISTANCE Description: STG Manage Bladder With min Assistance Outcome: Progressing   Problem: RH SAFETY Goal: RH STG ADHERE TO SAFETY PRECAUTIONS W/ASSISTANCE/DEVICE Description: STG Adhere to Safety Precautions With min Assistance/Device. Outcome: Progressing   Problem: RH COGNITION-NURSING Goal: RH STG ANTICIPATES NEEDS/CALLS FOR ASSIST W/ASSIST/CUES Description: STG Anticipates Needs/Calls for Assist With min Assistance/Cues. Outcome: Progressing   Problem: RH PAIN MANAGEMENT Goal: RH STG PAIN MANAGED AT OR BELOW PT'S PAIN GOAL Description: < 3 out of 10.  Outcome: Progressing

## 2019-10-08 NOTE — Progress Notes (Signed)
[place] PHYSICAL MEDICINE & REHABILITATION PROGRESS NOTE   Subjective/Complaints:  No issues overnite   ROS: limited due to language/communication   Objective:   No results found. No results for input(s): WBC, HGB, HCT, PLT in the last 72 hours. Recent Labs    10/07/19 0640 10/08/19 0508  NA 148*  --   K 4.0  --   CL 110  --   CO2 26  --   GLUCOSE 153*  --   BUN 41*  --   CREATININE 1.42* 1.45*  CALCIUM 9.5  --     Intake/Output Summary (Last 24 hours) at 10/08/2019 0749 Last data filed at 10/07/2019 2300 Gross per 24 hour  Intake 220 ml  Output -  Net 220 ml     Physical Exam: Vital Signs Blood pressure 135/65, pulse 66, temperature 98.3 F (36.8 C), temperature source Oral, resp. rate 15, height 5' (1.524 m), weight 67 kg, SpO2 99 %. Constitutional: No distress . Vital signs reviewed. HEENT: EOMI, oral membranes moist Neck: supple Cardiovascular: RRR without murmur. No JVD    Respiratory: CTA Bilaterally without wheezes or rales. Normal effort    GI: BS +, non-tender, non-distended  Skin: Warm and dry.  Intact. Psych: Normal mood.  Normal behavior. Musc:no pain with UE LE ROM, no evidence of edema  Neurologic: Alert., expressive>receptive aphasia. Occasionally mumbles, did not consistently follow simple commands Motor: Limited due to participation, but appears to be moving all extremities spontaneously, unchanged  Assessment/Plan: 1. 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  Physiatrist is providing close team supervision and 24 hour management of active medical problems listed below.  Physiatrist and rehab team continue to assess barriers to discharge/monitor patient progress toward functional and medical goals  Care Tool:  Bathing  Bathing activity did not occur: Refused Body parts bathed by patient: Right arm, Left arm, Chest,  Abdomen, Face, Right upper leg, Left upper leg   Body parts bathed by helper: Right lower leg, Left lower leg, Front perineal area, Buttocks     Bathing assist Assist Level: Moderate Assistance - Patient 50 - 74%     Upper Body Dressing/Undressing Upper body dressing Upper body dressing/undressing activity did not occur (including orthotics): Refused What is the patient wearing?: Pull over shirt    Upper body assist Assist Level: Minimal Assistance - Patient > 75%    Lower Body Dressing/Undressing Lower body dressing    Lower body dressing activity did not occur: Refused What is the patient wearing?: Pants, Incontinence brief     Lower body assist Assist for lower body dressing: Moderate Assistance - Patient 50 - 74%     Toileting Toileting Toileting Activity did not occur Landscape architect and hygiene only): Refused  Toileting assist Assist for toileting: Moderate Assistance - Patient 50 - 74%     Transfers Chair/bed transfer  Transfers assist  Chair/bed transfer activity did not occur: Safety/medical concerns  Chair/bed transfer assist level: Moderate Assistance - Patient 50 - 74%     Locomotion Ambulation   Ambulation assist   Ambulation activity did not occur: Safety/medical concerns  Assist level: Moderate Assistance - Patient 50 - 74% Assistive device: No Device Max distance: 10   Walk 10 feet activity   Assist  Walk 10 feet activity did not occur: Safety/medical concerns  Assist level: Moderate Assistance - Patient - 50 - 74% Assistive device: No Device  Walk 50 feet activity   Assist Walk 50 feet with 2 turns activity did not occur: Safety/medical concerns  Assist level: Minimal Assistance - Patient > 75% Assistive device: Walker-rolling    Walk 150 feet activity   Assist Walk 150 feet activity did not occur: Safety/medical concerns         Walk 10 feet on uneven surface  activity   Assist Walk 10 feet on uneven surfaces  activity did not occur: Safety/medical concerns         Wheelchair     Assist Will patient use wheelchair at discharge?: Yes Type of Wheelchair: Manual Wheelchair activity did not occur: Safety/medical concerns  Wheelchair assist level: Supervision/Verbal cueing Max wheelchair distance: 25    Wheelchair 50 feet with 2 turns activity    Assist    Wheelchair 50 feet with 2 turns activity did not occur: Safety/medical concerns   Assist Level: Supervision/Verbal cueing   Wheelchair 150 feet activity     Assist  Wheelchair 150 feet activity did not occur: Safety/medical concerns       Blood pressure 135/65, pulse 66, temperature 98.3 F (36.8 C), temperature source Oral, resp. rate 15, height 5' (1.524 m), weight 67 kg, SpO2 99 %.  Medical Problem List and Plan: 1.  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  Status post loop recorder placement  Cont CIR PT, OT,  SLP plan d/c in am  2.  Antithrombotics:  -DVT/anticoagulation: Lovenox.             -antiplatelet therapy: Aspirin 325 mg daily, Plavix 75 mg daily- 3. Pain Management: Tylenol as needed 4. Mood: Aricept 5 mg daily, Seroquel 25 mg nightly             -antipsychotic agents: seroquel QHS for sundowning, increased to 50mg  on 11/13 5. Neuropsych: This patient is not capable of making decisions on her own behalf. 6. Skin/Wound Care: Routine skin checks 7. Fluids/Electrolytes/Nutrition: Routine in and outs 8.  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  Monitor for any signs of fluid overload.  Lasix 40 mg twice daily Filed Weights   10/05/19 0507 10/06/19 0619 10/07/19 0516  Weight: 65.6 kg 66.2 kg 67 kg   Stable on 11/23 10.  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 ?AKI versus CKD stage III.    Creatinine 1.30 on 11/20   Fluid intake improving    Pre renal azotemia - po fluid improved but on Lasix 40mg  BID, will reduce to 20mg  BID   11.  Hypertension.  Lopressor 50 mg twice daily.  Monitor with increased mobility Vitals:   10/07/19 2003 10/08/19 0510  BP: (!) 120/55 135/65  Pulse: 87 66  Resp: 16 15  Temp: 99.1 F (37.3 C) 98.3 F (36.8 C)  SpO2: 98% 99%  good control - on metoprolol and lasix  12.  Hypothyroidism.  Synthroid 13.  Hyperlipidemia.  Lipitor 14.  Obesity: BMI 29.7 15. Post stroke seizure: complex partial   Continue Keppra 500mg  BID po as per Neuro, neuro has signed off but they will need to see her as an outpatient  No reported seizures  16.  OSA CPAP with oxygen at home. 17. Bradycardia:  Due to BB- stable  18.  Acute blood loss anemia  Hemoglobin 11.2  on 11/6, labs ordered for tomorrow 19.  HypoK due to loop diuretic, start [place] 31meq 11/18 , repeat BMET K+ 3.7 11/21  -recheck 11/23 4.0   LOS:21 days A FACE TO FACE EVALUATION WAS PERFORMED  Charlett Blake 10/08/2019, 7:49 AM

## 2019-10-08 NOTE — Progress Notes (Signed)
Physical Therapy Session Note  Patient Details  Name: April Le MRN: [id number] Date of Birth: 10-31-1941  Today's Date: 10/08/2019 PT Individual Time: [id number]; [id number] PT Individual Time Calculation (min): 30 min and 45 min  PT Missed Time: 15 min Missed Time Reason: patient unwilling to participate  Short Term Goals: Week 3:  PT Short Term Goal 1 (Week 3): STG=LTG due to ELOS  Skilled Therapeutic Interventions/Progress Updates:    Session 1: Pt received seated in w/c from nurse's station. Pt appears agreeable to participate in therapy session with no indications of pain. Seated hip ext 6 x 30 sec on Kinetron level 70 cm/sec for BLE NMR via reciprocal movement pattern. Pt does well understanding how to "march" while seated in w/c at [facility]. Manual w/c propulsion x 25 ft with use of BUE at Supervision level, pt unable to follow cues for directional steering 2/2 aphasia. Pt left seated in w/c at nurse's station with quick release belt and chair alarm in place at end of session.  Session 2: Pt received seated in w/c from nurse's station. Pt appears more restless this PM with max encouragement needed for participation. Per unit secretary pt's family will not be present for hands-on family education this PM due to waiting on equipment to be delivered at home and feeling comfortable with how training went yesterday. Pt indicates she needs to use the bathroom. Took pt into the bathroom to perform toilet transfer, pt stating "no" and indicates she wants to go back to bed and does not need to use the bathroom. Encouraged pt to participate in car transfer as she needs to be able to practice this in order to d/c home safely tomorrow. Once pt taken down to therapy gym and setup for car transfer on simulation car she keeps stating "no" and indicating she is not going to perform the transfer. Pt's regular OT present and able to provide familiarity and encouragement for pt to participate in car  transfer. Pt again repeating "no" and placing her safety belt back around herself. Pt becomes agitated and does not allow therapists to place her feet on her foot rests of w/c. Took pt back to her room to assist her with transfer back to bed. Pt indicating she no longer wishes to return to bed. Pt pulling at her brief and indicates she would like her brief changed. Took pt back into bathroom where she then indicates she will not be participating in a toilet transfer. Pt left seated in w/c at nurse's station with quick release belt and chair alarm in place. Pt missed 15 min of scheduled therapy session due to perseveration on "no" and unwillingness to participate in any functional therapy tasks this PM.   Therapy Documentation Precautions:  Precautions Precautions: Fall Precaution Comments: Aphasic, apraxic Restrictions Weight Bearing Restrictions: No    Therapy/Group: Individual Therapy   Excell Seltzer, PT, DPT  10/08/2019, 4:06 PM

## 2019-10-08 NOTE — Progress Notes (Signed)
Occupational Therapy Session Note  Patient Details  Name: Cheena Hofmann MRN: [id number] Date of Birth: 1941-06-02  Today's Date: 10/08/2019 OT Individual Time: 0700-0723 OT Individual Time Calculation (min): 23 min  40 missed minutes secondary to fatigue  Short Term Goals: Week 3:  OT Short Term Goal 1 (Week 3): STGs=LTGs secondary to upcoming discharge  Skilled Therapeutic Interventions/Progress Updates:    Upon entering the room, pt supine in bed and sleeping soundly. OT setting up room for self care while trying to arouse pt from sleep. Pt opened eyes slighly and said , " No" and promptly closing eyes. OT making multiple attempts to wake pt from sleep but she continues to decline. Pt missing 40 minutes of skilled OT intervention secondary to fatigue.   Therapy Documentation Precautions:  Precautions Precautions: Fall Precaution Comments: Aphasic, apraxic Restrictions Weight Bearing Restrictions: No General: General OT Amount of Missed Time: 40 Minutes Vital Signs:   Pain:   ADL: ADL Eating: Supervision/safety Where Assessed-Eating: Wheelchair Grooming: Supervision/safety Where Assessed-Grooming: Sitting at sink Upper Body Bathing: Supervision/safety Where Assessed-Upper Body Bathing: Sitting at sink Lower Body Bathing: Moderate assistance Where Assessed-Lower Body Bathing: Standing at sink, Sitting at sink Upper Body Dressing: Minimal assistance Where Assessed-Upper Body Dressing: Sitting at sink Lower Body Dressing: Moderate assistance Where Assessed-Lower Body Dressing: Sitting at sink, Standing at sink Toileting: Moderate assistance Where Assessed-Toileting: Glass blower/designer: Psychiatric nurse Method: Arts development officer: Radiographer, therapeutic: Not assessed Vision   Perception    Praxis   Exercises:   Other Treatments:     Therapy/Group: Individual Therapy  Gypsy Decant 10/08/2019, 12:44  PM

## 2019-10-08 NOTE — Progress Notes (Signed)
Restless and attempted to get OOB without assistance, until 2130. Telesitter and alarms in place to alert staff.  Expressive aphasia, difficult to figure out what patient needs at times. Incontinent of B & B. LBM 11/21. April Le

## 2019-10-09 ENCOUNTER — Inpatient Hospital Stay (HOSPITAL_COMMUNITY): Payer: Medicare Other | Admitting: Physical Therapy

## 2019-10-09 MED ORDER — POTASSIUM CHLORIDE CRYS ER 20 MEQ PO TBCR: 20.0000 meq | EXTENDED_RELEASE_TABLET | Freq: Every day | ORAL | 0 refills | Status: AC

## 2019-10-09 MED ORDER — CLOPIDOGREL BISULFATE 75 MG PO TABS: 75.0000 mg | ORAL_TABLET | Freq: Every day | ORAL | 0 refills | Status: AC

## 2019-10-09 MED ORDER — RESOURCE THICKENUP CLEAR PO POWD: 1.0000 g | ORAL | 2 refills | Status: AC | PRN

## 2019-10-09 MED ORDER — LEVETIRACETAM 100 MG/ML PO SOLN: 500.0000 mg | Freq: Two times a day (BID) | ORAL | 0 refills | Status: AC

## 2019-10-09 MED ORDER — ALBUTEROL SULFATE (2.5 MG/3ML) 0.083% IN NEBU: 2.5000 mg | INHALATION_SOLUTION | RESPIRATORY_TRACT | 12 refills | Status: DC | PRN

## 2019-10-09 MED ORDER — ASPIRIN 325 MG PO TABS: 325.0000 mg | ORAL_TABLET | Freq: Every day | ORAL | 0 refills | Status: AC

## 2019-10-09 MED ORDER — ACETAMINOPHEN 325 MG PO TABS: 650.0000 mg | ORAL_TABLET | ORAL | Status: DC | PRN

## 2019-10-09 MED ORDER — QUETIAPINE FUMARATE 25 MG PO TABS: 25.0000 mg | ORAL_TABLET | Freq: Two times a day (BID) | ORAL | 0 refills | Status: DC

## 2019-10-09 MED ORDER — METOPROLOL TARTRATE 50 MG PO TABS: 50.0000 mg | ORAL_TABLET | Freq: Two times a day (BID) | ORAL | 0 refills | Status: AC

## 2019-10-09 MED ORDER — DONEPEZIL HCL 5 MG PO TABS: 5.0000 mg | ORAL_TABLET | Freq: Every day | ORAL | 0 refills | Status: AC

## 2019-10-09 MED ORDER — ATORVASTATIN CALCIUM 20 MG PO TABS: 20.0000 mg | ORAL_TABLET | Freq: Every day | ORAL | 0 refills | Status: AC

## 2019-10-09 MED ORDER — LISINOPRIL 2.5 MG PO TABS: 2.5000 mg | ORAL_TABLET | Freq: Every day | ORAL | 0 refills | Status: DC

## 2019-10-09 MED ORDER — LEVOTHYROXINE SODIUM 50 MCG PO TABS: 50.0000 ug | ORAL_TABLET | Freq: Every day | ORAL | 0 refills | Status: AC

## 2019-10-09 MED ORDER — ATORVASTATIN CALCIUM 20 MG PO TABS: 20.0000 mg | ORAL_TABLET | Freq: Every day | ORAL | 0 refills | Status: DC

## 2019-10-09 MED ORDER — FUROSEMIDE 40 MG PO TABS: 40.0000 mg | ORAL_TABLET | Freq: Two times a day (BID) | ORAL | 0 refills | Status: AC

## 2019-10-09 NOTE — Discharge Instructions (Signed)
Inpatient Rehab Discharge Instructions  [place] Discharge date and time: 10/09/19    Activities/Precautions/ Functional Status: Activity: activity as tolerated Diet: mechanical soft with nectar thick liquids. Needs supervision at meals Wound Care: none needed and keep wound clean and dry   Functional status:  ___ No restrictions     ___ Walk up steps independently _X__ 24/7 supervision/assistance   ___ Walk up steps with assistance ___ Intermittent supervision/assistance  ___ Bathe/dress independently ___ Walk with walker     _x__ Bathe/dress with assistance ___ Walk Independently    ___ Shower independently _X__ Walk with assistance    ___ Shower with assistance ___ No alcohol     ___ Return to work/school ________   Special Instructions: 1. No smoking or alcohol 2. Water protocol as per instructions.    COMMUNITY REFERRALS UPON DISCHARGE:    Home Health:   PT, OT, SP, RN  Agency:[facility] Phone:[phone]   Date of last service:10/09/2019  Medical Equipment/Items Ordered:HOSPITAL BED AND BEDSIDE COMMODE  Agency/Supplier:[facility]   [phone]  Other:FAMILY TO HIRE PRIVATE DUTY ASSIST TO ASSIST HUSBAND WITH WIFE'S CARE    STROKE/TIA DISCHARGE INSTRUCTIONS SMOKING Cigarette smoking nearly doubles your risk of having a stroke & is the single most alterable risk factor  If you smoke or have smoked in the last 12 months, you are advised to quit smoking for your health.  Most of the excess cardiovascular risk related to smoking disappears within a year of stopping.  Ask you doctor about anti-smoking medications   Quit Line: 1-800-QUIT NOW  Free Smoking Cessation Classes [phone]  CHOLESTEROL Know your levels; limit fat & cholesterol in your diet  Lipid Panel     Component Value Date/Time   CHOL 116 09/12/2019 0507   TRIG 169 (H) 09/12/2019 0507   HDL 39 (L) 09/12/2019 0507   CHOLHDL 3.0 09/12/2019 0507   VLDL 34 09/12/2019 0507   LDLCALC 43 09/12/2019 0507      Many patients benefit from treatment even if their cholesterol is at goal.  Goal: Total Cholesterol (CHOL) less than 160  Goal:  Triglycerides (TRIG) less than 150  Goal:  HDL greater than 40  Goal:  LDL (LDLCALC) less than 100   BLOOD PRESSURE American Stroke Association blood pressure target is less that 120/80 mm/Hg  Your discharge blood pressure is:  BP: (!) 168/56  Monitor your blood pressure  Limit your salt and alcohol intake  Many individuals will require more than one medication for high blood pressure  DIABETES (A1c is a blood sugar average for last 3 months) Goal HGBA1c is under 7% (HBGA1c is blood sugar average for last 3 months)  Diabetes: No known diagnosis of diabetes    Lab Results  Component Value Date   HGBA1C 6.7 (H) 09/12/2019     Your HGBA1c can be lowered with medications, healthy diet, and exercise.  Check your blood sugar as directed by your physician  Call your physician if you experience unexplained or low blood sugars.  PHYSICAL ACTIVITY/REHABILITATION Goal is 30 minutes at least 4 days per week  Activity: Increase activity slowly, Therapies: Physical Therapy: Home Health Return to work:   Activity decreases your risk of heart attack and stroke and makes your heart stronger.  It helps control your weight and blood pressure; helps you relax and can improve your mood.  Participate in a regular exercise program.  Talk with your doctor about the best form of exercise for you (dancing, walking, swimming,  cycling).  DIET/WEIGHT Goal is to maintain a healthy weight  Your discharge diet is:  Diet Order            Diet NPO time specified  Diet effective now              liquids Your height is:  Height: 5' (152.4 cm) Your current weight is: Weight: (!) 160 kg Your Body Mass Index (BMI) is:  BMI (Calculated): 68.89  Following the type of diet specifically designed for you will help prevent another stroke.  Your  goal weight range is:    Your goal Body Mass Index (BMI) is 19-24.  Healthy food habits can help reduce 3 risk factors for stroke:  High cholesterol, hypertension, and excess weight.  RESOURCES Stroke/Support Group:  Call [phone]   STROKE EDUCATION PROVIDED/REVIEWED AND GIVEN TO PATIENT Stroke warning signs and symptoms How to activate emergency medical system (call 911). Medications prescribed at discharge. Need for follow-up after discharge. Personal risk factors for stroke. Pneumonia vaccine given:  Flu vaccine given:  My questions have been answered, the writing is legible, and I understand these instructions.  I will adhere to these goals & educational materials that have been provided to me after my discharge from the hospital.      My questions have been answered and I understand these instructions. I will adhere to these goals and the provided educational materials after my discharge from the hospital.  Patient/Caregiver Signature _______________________________ Date __________  Clinician Signature _______________________________________ Date __________  Please bring this form and your medication list with you to all your follow-up doctor's appointments.

## 2019-10-09 NOTE — Plan of Care (Signed)
  Problem: Consults Goal: RH STROKE PATIENT EDUCATION Description: See Patient Education module for education specifics  Outcome: Completed/Met   Problem: RH BOWEL ELIMINATION Goal: RH STG MANAGE BOWEL WITH ASSISTANCE Description: STG Manage Bowel with [place]. Outcome: Completed/Met Goal: RH STG MANAGE BOWEL W/MEDICATION W/ASSISTANCE Description: STG Manage Bowel with Medication with min Assistance. Outcome: Completed/Met   Problem: RH BLADDER ELIMINATION Goal: RH STG MANAGE BLADDER WITH ASSISTANCE Description: STG Manage Bladder With min Assistance Outcome: Completed/Met   Problem: RH SAFETY Goal: RH STG ADHERE TO SAFETY PRECAUTIONS W/ASSISTANCE/DEVICE Description: STG Adhere to Safety Precautions With min Assistance/Device. Outcome: Completed/Met   Problem: RH COGNITION-NURSING Goal: RH STG ANTICIPATES NEEDS/CALLS FOR ASSIST W/ASSIST/CUES Description: STG Anticipates Needs/Calls for Assist With min Assistance/Cues. Outcome: Completed/Met   Problem: RH PAIN MANAGEMENT Goal: RH STG PAIN MANAGED AT OR BELOW PT'S PAIN GOAL Description: < 3 out of 10.  Outcome: Completed/Met

## 2019-10-09 NOTE — Progress Notes (Signed)
[place] PHYSICAL MEDICINE & REHABILITATION PROGRESS NOTE   Subjective/Complaints:  Remains severely aphasic, no issues overnite per RN   ROS: limited due to language/communication   Objective:   No results found. No results for input(s): WBC, HGB, HCT, PLT in the last 72 hours. Recent Labs    10/07/19 0640 10/08/19 0508  NA 148*  --   K 4.0  --   CL 110  --   CO2 26  --   GLUCOSE 153*  --   BUN 41*  --   CREATININE 1.42* 1.45*  CALCIUM 9.5  --     Intake/Output Summary (Last 24 hours) at 10/09/2019 0727 Last data filed at 10/08/2019 1700 Gross per 24 hour  Intake 240 ml  Output -  Net 240 ml     Physical Exam: Vital Signs Blood pressure (!) 113/58, pulse 70, temperature 97.7 F (36.5 C), temperature source Oral, resp. rate 18, height 5' (1.524 m), weight 68.6 kg, SpO2 98 %. Constitutional: No distress . Vital signs reviewed. HEENT: EOMI, oral membranes moist Neck: supple Cardiovascular: RRR without murmur. No JVD    Respiratory: CTA Bilaterally without wheezes or rales. Normal effort    GI: BS +, non-tender, non-distended  Skin: Warm and dry.  Intact. Psych: Normal mood.  Normal behavior. Musc:no pain with UE LE ROM, no evidence of edema  Neurologic: Alert., expressive>receptive aphasia. Occasionally mumbles, did not consistently follow simple commands Motor: Limited due to participation, but appears to be moving all extremities spontaneously, unchanged  Assessment/Plan: 1. 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   Stable for D/C today F/u PCP in 3-4 weeks F/u PM&R 2 weeks See D/C summary See D/C instructions Care Tool:  Bathing  Bathing activity did not occur: Refused Body parts bathed by patient: Right arm, Left arm, Chest, Abdomen, Face, Right upper leg, Left upper leg   Body parts bathed by helper: Right lower leg, Left lower leg, Front perineal area, Buttocks     Bathing assist Assist Level: Moderate Assistance - Patient 50 -  74%(per staff report)     Upper Body Dressing/Undressing Upper body dressing Upper body dressing/undressing activity did not occur (including orthotics): Refused What is the patient wearing?: Pull over shirt    Upper body assist Assist Level: Minimal Assistance - Patient > 75%(per staff report)    Lower Body Dressing/Undressing Lower body dressing    Lower body dressing activity did not occur: Refused What is the patient wearing?: Pants, Incontinence brief     Lower body assist Assist for lower body dressing: Moderate Assistance - Patient 50 - 74%(per staff report)     Toileting Toileting Toileting Activity did not occur Landscape architect and hygiene only): Refused  Toileting assist Assist for toileting: Moderate Assistance - Patient 50 - 74%(per staff report)     Transfers Chair/bed transfer  Transfers assist  Chair/bed transfer activity did not occur: Safety/medical concerns  Chair/bed transfer assist level: Moderate Assistance - Patient 50 - 74%     Locomotion Ambulation   Ambulation assist   Ambulation activity did not occur: Safety/medical concerns  Assist level: Moderate Assistance - Patient 50 - 74% Assistive device: No Device Max distance: 10   Walk 10 feet activity   Assist  Walk 10 feet activity did not occur: Safety/medical concerns  Assist level: Moderate Assistance - Patient - 50 - 74% Assistive device: No Device   Walk 50 feet activity   Assist Walk 50 feet with 2 turns activity did not occur:  Safety/medical concerns  Assist level: Minimal Assistance - Patient > 75% Assistive device: Walker-rolling    Walk 150 feet activity   Assist Walk 150 feet activity did not occur: Safety/medical concerns         Walk 10 feet on uneven surface  activity   Assist Walk 10 feet on uneven surfaces activity did not occur: Safety/medical concerns         Wheelchair     Assist Will patient use wheelchair at discharge?: Yes Type  of Wheelchair: Manual Wheelchair activity did not occur: Safety/medical concerns  Wheelchair assist level: Supervision/Verbal cueing Max wheelchair distance: 25    Wheelchair 50 feet with 2 turns activity    Assist    Wheelchair 50 feet with 2 turns activity did not occur: Safety/medical concerns   Assist Level: Supervision/Verbal cueing   Wheelchair 150 feet activity     Assist  Wheelchair 150 feet activity did not occur: Safety/medical concerns       Blood pressure (!) 113/58, pulse 70, temperature 97.7 F (36.5 C), temperature source Oral, resp. rate 18, height 5' (1.524 m), weight 68.6 kg, SpO2 98 %.  Medical Problem List and Plan: 1.  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  Status post loop recorder placement   plan d/c today  2.  Antithrombotics:  -DVT/anticoagulation: Lovenox.             -antiplatelet therapy: Aspirin 325 mg daily, Plavix 75 mg daily- 3. Pain Management: Tylenol as needed 4. Mood: Aricept 5 mg daily, Seroquel 25 mg nightly             -antipsychotic agents: seroquel QHS for sundowning, increased to 50mg  on 11/13 5. Neuropsych: This patient is not capable of making decisions on her own behalf. 6. Skin/Wound Care: Routine skin checks 7. Fluids/Electrolytes/Nutrition: Routine in and outs 8.  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  Monitor for any signs of fluid overload.  Lasix 40 mg twice daily Filed Weights   10/06/19 0619 10/07/19 0516 10/09/19 0515  Weight: 66.2 kg 67 kg 68.6 kg   Stable on 11/23 10.  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 ?AKI versus CKD stage III.    Creatinine 1.30 on 11/20   Fluid intake improving    Pre renal azotemia - po fluid improved but on Lasix 40mg  BID,  will reduce to 20mg  BID  - PCP f/u as OP  11.  Hypertension.  Lopressor 50 mg twice daily.  Monitor with increased mobility Vitals:   10/08/19 1920 10/09/19 0515  BP: 125/75 (!) 113/58  Pulse: 78 70  Resp: 16 18  Temp: 97.7 F (36.5 C) 97.7 F (36.5 C)  SpO2: 95% 98%  good control 11/25 - on metoprolol and lasix  12.  Hypothyroidism.  Synthroid 13.  Hyperlipidemia.  Lipitor 14.  Obesity: BMI 29.7 15. Post stroke seizure: complex partial   Continue Keppra 500mg  BID po as per Neuro, neuro has signed off but they will need to see her as an outpatient  No reported seizures  16.  OSA CPAP with oxygen at home. 17. Bradycardia:  Due to BB- stable  18.  Acute blood loss anemia  Hemoglobin 11.2 on 11/6, labs ordered for tomorrow 19.  HypoK due to loop diuretic, start Kdur 1meq  11/18 , repeat BMET K+ 3.7 11/21  -recheck 11/23 4.0   LOS:22 days A FACE TO FACE EVALUATION WAS PERFORMED  Charlett Blake 10/09/2019, 7:27 AM

## 2019-10-09 NOTE — Progress Notes (Signed)
Patient discharged off the floor via wheelchair with husband and daughter and all belongings. Family denies any further questions at this time. No complications noted at this time.  [place]

## 2019-10-14 ENCOUNTER — Telehealth: Payer: Self-pay | Admitting: *Deleted

## 2019-10-14 NOTE — Telephone Encounter (Signed)
Transitional care call completed. Spoke with husband April Le. Confirmed appointment.  No packet was mailed as appointment is on 10/18/2019.  I gave the husband our address and encouraged to arrive 30 minutes early.     1. Are you/is patient experiencing any problems since coming home? A bit overwhelming, adjusting to a new normal. This is patient's 5th stroke Are there any questions regarding any aspect of care? No, patient's husband says that the case manager for [facility] is excptional  2. Are there any questions regarding medications administration/dosing? No  Are meds being taken as prescribed? Yes Patient should review meds with caller to confirm   3. Have there been any falls? No  4. Has Home Health been to the house and/or have they contacted you? Case manager has been to the home, therapy to follow  If not, have you tried to contact them? Can we help you contact them?   5. Are bowels and bladder emptying properly?They are emptying properly, very little control. Are there any unexpected incontinence issues? It is not unexpected If applicable, is patient following bowel/bladder programs?   6. Any fevers, problems with breathing, unexpected pain? No  7. Are there any skin problems or new areas of breakdown? No, easy bruising  8. Has the patient/family member arranged specialty MD follow up (ie cardiology/neurology/renal/surgical/etc)? A plan is in place Can we help arrange?   9. Does the patient need any other services or support that we can help arrange? No  10. Are caregivers following through as expected in assisting the patient? For now it is a bit rough. It will ease when [facility] is involved.  11. Has the patient quit smoking, drinking alcohol, or using drugs as recommended? Patient does not do these things.

## 2019-10-18 ENCOUNTER — Encounter
Payer: Medicare Other | Attending: Physical Medicine and Rehabilitation | Admitting: Physical Medicine and Rehabilitation

## 2019-10-18 ENCOUNTER — Encounter: Payer: Self-pay | Admitting: Physical Medicine and Rehabilitation

## 2019-10-18 ENCOUNTER — Other Ambulatory Visit: Payer: Self-pay

## 2019-10-18 VITALS — BP 101/68 | HR 103 | Temp 98.9°F | Ht 60.0 in | Wt 151.2 lb

## 2019-10-18 DIAGNOSIS — R630 Anorexia: Secondary | ICD-10-CM | POA: Insufficient documentation

## 2019-10-18 DIAGNOSIS — I63512 Cerebral infarction due to unspecified occlusion or stenosis of left middle cerebral artery: Secondary | ICD-10-CM | POA: Diagnosis not present

## 2019-10-18 MED ORDER — MIRTAZAPINE 7.5 MG PO TABS: 7.5000 mg | ORAL_TABLET | Freq: Every day | ORAL | 0 refills | Status: AC

## 2019-10-18 NOTE — Progress Notes (Signed)
Subjective:    Patient ID: April Le, female    DOB: 19-Jan-1941, 78 y.o.   MRN: [id number]  HPI  April Le is a 78 year-old woman presenting today for hospital follow-up after a large left middle cerebral artery stroke. She is accompanied by her daughter.   She is not eating, but is staying hydrated. Her daughter says she is mostly taking Ensure.   She is having issues with transfers. When PT assess her they recommended that she stand every hour but even that has been difficult. They have a bath aide coming twice per week. Last night they hired someone who will help her husband to get her dressed and to feed her in the morning.  She is also very lethargic. Her PCP reduced her Seroquel dose to once per day yesterday.   Her daughter is continuing to work but visits her regularly. Her husband used to work part-time but has stopped working to care for his wife.   She is incontinent and using diapers.   She is currently still taking Lisinopril daily, Metoprolol 50mg  BID, Keppra BID, Seroquel 25mg  daily, donepezil daily, synthroid daily, atorvastatin qd, aspirin qd, clopidogrel qd, and K+ 25meq qd. She is not taking the Tylenol.  Her last K+ checked in November was 4.0  Pain Inventory Average Pain no pain Pain Right Now no pain My pain is no pain  In the last 24 hours, has pain interfered with the following? General activity no pain Relation with others no pain Enjoyment of life no pain What TIME of day is your pain at its worst? no pain Sleep (in general) NA  Pain is worse with: no pain Pain improves with: no pain Relief from Meds: no pain  Mobility use a wheelchair needs help with transfers  Function I need assistance with the following:  feeding, dressing, bathing, toileting, meal prep, household duties and shopping  Neuro/Psych bladder control problems bowel control problems weakness confusion  Prior Studies Any changes since last visit?  no  Physicians involved  in your care Any changes since last visit?  no Saw PCP yesterday  Family History  Problem Relation Age of Onset  . Kidney failure Mother   . Heart attack Father   . Heart failure Father   . Stomach cancer Other   . Stomach cancer Maternal Grandmother   . Stroke Paternal Grandmother    Social History   Socioeconomic History  . Marital status: Married    Spouse name: Not on file  . Number of children: 2  . Years of education: 50  . Highest education level: Not on file  Occupational History  . Occupation: retired  Scientific laboratory technician  . Financial resource strain: Not on file  . Food insecurity    Worry: Not on file    Inability: Not on file  . Transportation needs    Medical: Not on file    Non-medical: Not on file  Tobacco Use  . Smoking status: Former Smoker    Types: Cigarettes    Quit date: 11/15/1959    Years since quitting: 59.9  . Smokeless tobacco: Never Used  Substance and Sexual Activity  . Alcohol use: Yes    Alcohol/week: 0.0 standard drinks    Comment: very rarely  . Drug use: No  . Sexual activity: Never  Lifestyle  . Physical activity    Days per week: Not on file    Minutes per session: Not on file  . Stress: Not on file  Relationships  . Social Herbalist on phone: Not on file    Gets together: Not on file    Attends religious service: Not on file    Active member of club or organization: Not on file    Attends meetings of clubs or organizations: Not on file    Relationship status: Not on file  Other Topics Concern  . Not on file  Social History Narrative   Patient does not drink caffeine.   Patient is right handed.    Past Surgical History:  Procedure Laterality Date  . ABDOMINAL HYSTERECTOMY    . CHOLECYSTECTOMY    . IR CT HEAD LTD  09/11/2019  . IR PERCUTANEOUS ART THROMBECTOMY/INFUSION INTRACRANIAL INC DIAG ANGIO  09/11/2019  . LAPAROTOMY  11/22/2011   Procedure: EXPLORATORY LAPAROTOMY;  Surgeon: Imagene Gurney A. Alycia Rossetti, MD;  Location: [facility];  Service: Gynecology;  Laterality: N/A;  . LOOP RECORDER INSERTION N/A 09/16/2019   Procedure: LOOP RECORDER INSERTION;  Surgeon: Evans Lance, MD;  Location: [facility];  Service: Cardiovascular;  Laterality: N/A;  . MENISCUS REPAIR    . RADIOLOGY WITH ANESTHESIA N/A 09/11/2019   Procedure: IR WITH ANESTHESIA;  Surgeon: Radiologist, Medication, MD;  Location: [facility];  Service: Radiology;  Laterality: N/A;  . SALPINGOOPHORECTOMY  11/22/2011   Procedure: SALPINGO OOPHERECTOMY;  Surgeon: Imagene Gurney A. Alycia Rossetti, MD;  Location: [facility];  Service: Gynecology;  Laterality: Bilateral;  . TOE SURGERY    . TONSILLECTOMY    . TUBAL LIGATION     Past Medical History:  Diagnosis Date  . Abdominal distention    past two months  . Abnormality of gait 10/28/2015  . Aphasia due to acute stroke ([place])   . 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 Arthritis    right knee  . Benign essential HTN 11/19/2013  . CAP (community acquired pneumonia) 09/07/2013  . CHF (congestive heart failure) ([place])   . Chronic diastolic heart failure ([place]) 11/19/2013  . Chronic kidney disease    STAGE III KIDNEY DISEASE-PER PT--PT SEES DR. Risa Grill UROLOGIST  . Complication of anesthesia    PT REMEMBERS BREATHING PROBLEMS WAKING UP FROM KNEE SURGERY AT [facility] 2011 OR 2012  . Coronary artery calcification seen on CT scan 01/10/2017  . Fever of unknown origin 09/04/2013  . GERD (gastroesophageal reflux disease)   . Headache   . Headache(784.0)   . 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 Hypercholesteremia   . Hypertension   . Hypertensive heart disease with heart failure ([place]) 01/10/2017  . Hypothyroidism   . Hypoxia 09/04/2013  . Leukocytosis 09/04/2013  . Mass of ovary 10/26/2011  . OSA (obstructive sleep apnea) 11/19/2013  . Pneumonia   . Shortness of breath    WITH EXERTION  . Sinus drainage    PT STARTED ANTIBIOTIC 11/17/11 --IS HOARSE TODAY, SOME WHEEZING AND  COUGH WITH YELLOW DRAINAGE  . SIRS (systemic inflammatory response syndrome) ([place]) 09/04/2013  . SOB (shortness of breath) 09/04/2013  . Stroke ([place]) 02/2007, 08/2009   2 total -RESIDUAL WEAKNESS LEFT LEG--AND APHASIA  . Stroke ([place]) 09/01/2017  . Stroke ([place]) 09/08/2017  . Tumor of ovary   . Urinary incontinence   . Urinary incontinence   . Weakness of left leg    Mildly, post stroke   BP 101/68   Pulse (!) 103   Temp 98.9 F (37.2 C)   Ht 5' (1.524 m)  Comment: last recorded  Wt 151 lb 3.2 oz (68.6 kg) Comment: last recorded  BMI 29.53 kg/m   Opioid Risk Score:   Fall Risk Score:  `1  Depression screen PHQ 2/9  No flowsheet data found.  Review of Systems  Constitutional: Positive for appetite change.       Will not eat  HENT: Negative.   Eyes: Negative.   Respiratory: Negative.   Cardiovascular: Negative.   Gastrointestinal:       Incontinent at times  Endocrine: Negative.   Genitourinary:       Incontinent at times  Musculoskeletal: Negative.   Skin: Negative.   Allergic/Immunologic: Negative.   Neurological: Positive for weakness.  Hematological: Bruises/bleeds easily.       Plavix  Psychiatric/Behavioral: Positive for confusion.       Very lethargic and PCP decreased Seroquel to daily but has not changed       Objective:   Physical Exam Constitutional: No distress. Lethargic. Vital signs reviewed. HEENT: EOMI, oral membranes moist Neck: supple Cardiovascular: Tachycardic without murmur. No JVD    Respiratory: CTA Bilaterally without wheezes or rales. Normal effort    GI: BS +, non-tender, non-distended  Skin: Warm and dry.  Intact. Psych: Lethargic.  Musc:no pain with UE LE ROM, no evidence of edema . Able to move upper extremities on command, more difficulty doing this with lower extremities.  Neurologic: Lethargic, expressive>receptive aphasia. Occasionally mumbles, did not consistently follow simple commands Motor: Limited due to participation,  but appears to be moving all extremities spontaneously, unchanged       Assessment & Plan:  Mrs. Brockelman is a 78 year-old woman presenting today for hospital follow-up after a large left middle cerebral artery stroke.   Left MCA stroke resulting in impaired mobility and ADLs --Continue home PT, OT, SLP to maximize mobility and independence. --Encourage out of chair once per hour to minimize chances of pressure sores. --SLP to monitor and treat dysphagia and aphasia  Decreased appetite: --Prescribed Mirtazepine 7.5 HS to stimulate appetite. Advised that this medication can also help with depression and insomnia, though Mrs. Roederer does not have these symptoms. If well tolerated, advised that dose can be doubled.  Lethargy: --Recommended to stop Seroquel 25mg  daily. Monitor for increased agitation. If present, may need to restart. If without agitation but still lethargic can consider starting Ritalin.  Hypotension: --Systolic BP 99991111 today. Advised to stop Lisinopril 2.5mg  daily.   Tachycardia:  --HR 100 today. Continue Metoprolol 50 BID.   Polypharmacy: --Will discontinue Tylenol, which she is not using, --Advised that other medications should be maintained at this time.  Thirty minutes of face to face patient care time were spent during this visit. All questions were encouraged and answered. Follow up with me in 4 weeks.

## 2019-10-22 ENCOUNTER — Ambulatory Visit: Payer: Medicare Other | Admitting: Cardiology

## 2019-11-04 ENCOUNTER — Telehealth: Payer: Self-pay | Admitting: Cardiology

## 2019-11-04 NOTE — Telephone Encounter (Signed)
Sherri, patient's husband called this morning to let us know patient passed 11-03-19, and to ask where he should send device that does the home monitoring through [address]. Can you advise?

## 2019-11-04 NOTE — Telephone Encounter (Signed)
Spoke with patient's husband. [employer] return kit ordered to home address on file. Pt's husband in agreement with plan, no further questions or concerns at this time.

## 2019-11-06 NOTE — Telephone Encounter (Signed)
Error

## 2019-11-15 DEATH — deceased

## 2019-11-18 ENCOUNTER — Ambulatory Visit: Payer: Medicare Other | Admitting: Physical Medicine and Rehabilitation

## 2019-11-27 ENCOUNTER — Inpatient Hospital Stay: Payer: Medicare Other | Admitting: Adult Health

## 2019-12-05 ENCOUNTER — Ambulatory Visit: Payer: Medicare Other | Admitting: Cardiology

## 2020-11-14 DEATH — deceased

## 2021-08-13 IMAGING — RF DG ABDOMEN 1V
1 series · 1 of 1 positions shown · IV contrast (omnipaque)
Comparison: KUB 10/28/2016

CLINICAL DATA: Ten French enteric feeding tube placed via
fluoroscopy using 15 mL of Omnipaque 300.

EXAM:
ABDOMEN - 1 VIEW

[Series 1: cp_standard · 0.17mm/px · 1 of 1 slices shown]
[im 1/1]
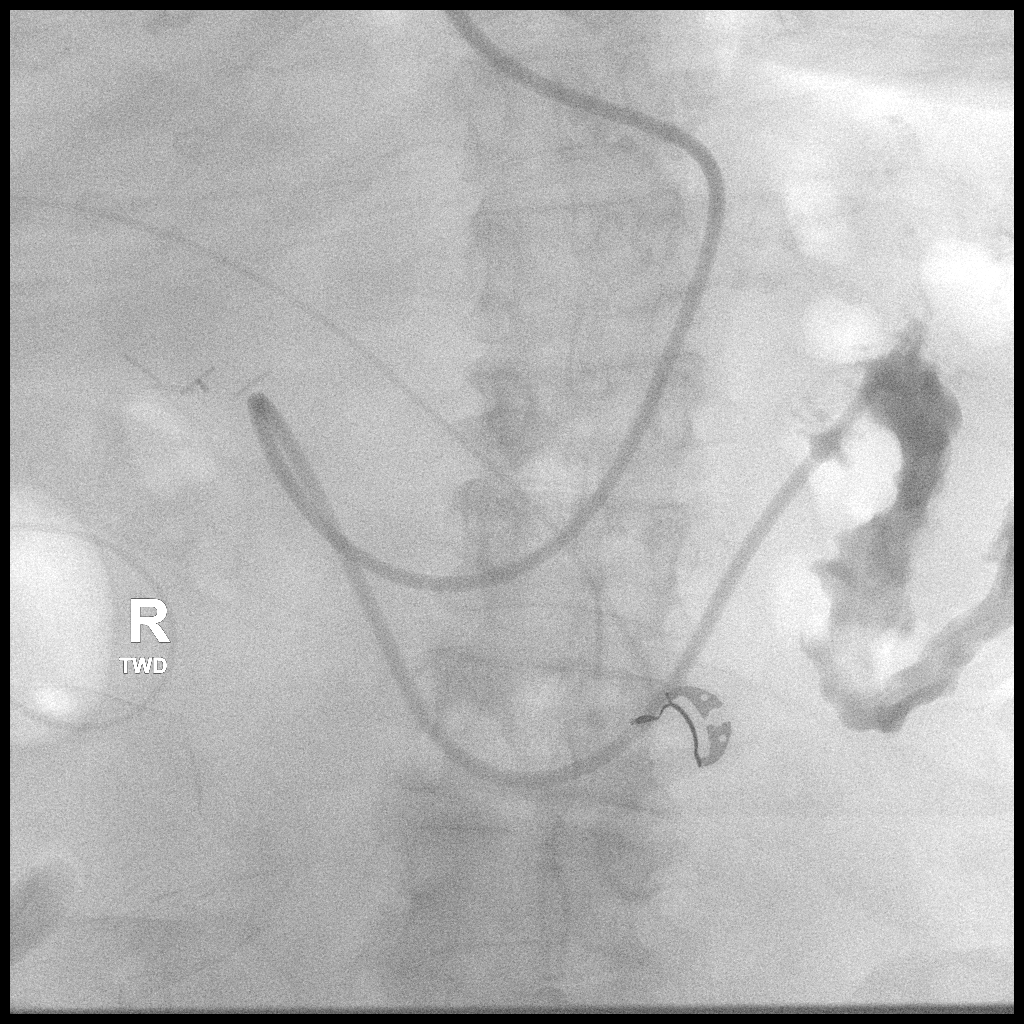

[1 of 1 positions shown; findings below may reference images not displayed]

FINDINGS: Dobbhoff feeding tube courses through the stomach and duodenum with
tip over the proximal jejunum left of midline in the upper abdomen.
Contrast is seen filling the proximal jejunum. Bowel gas pattern
otherwise unremarkable. Mild degenerate change of the spine. Stable
compression fracture over the upper lumbar spine.
IMPRESSION: Nonobstructive bowel gas pattern.

Dobbhoff feeding tube with tip over the proximal jejunum in the left
mid to upper abdomen.

## 2021-08-14 IMAGING — DX DG CHEST 1V PORT
1 series · 1 of 1 positions shown · non-contrast
Comparison: 09/16/2019 portable chest and earlier.

CLINICAL DATA: 78-year-old female with shortness of breath. Feeding
tube.

EXAM:
PORTABLE CHEST 1 VIEW

[chest]
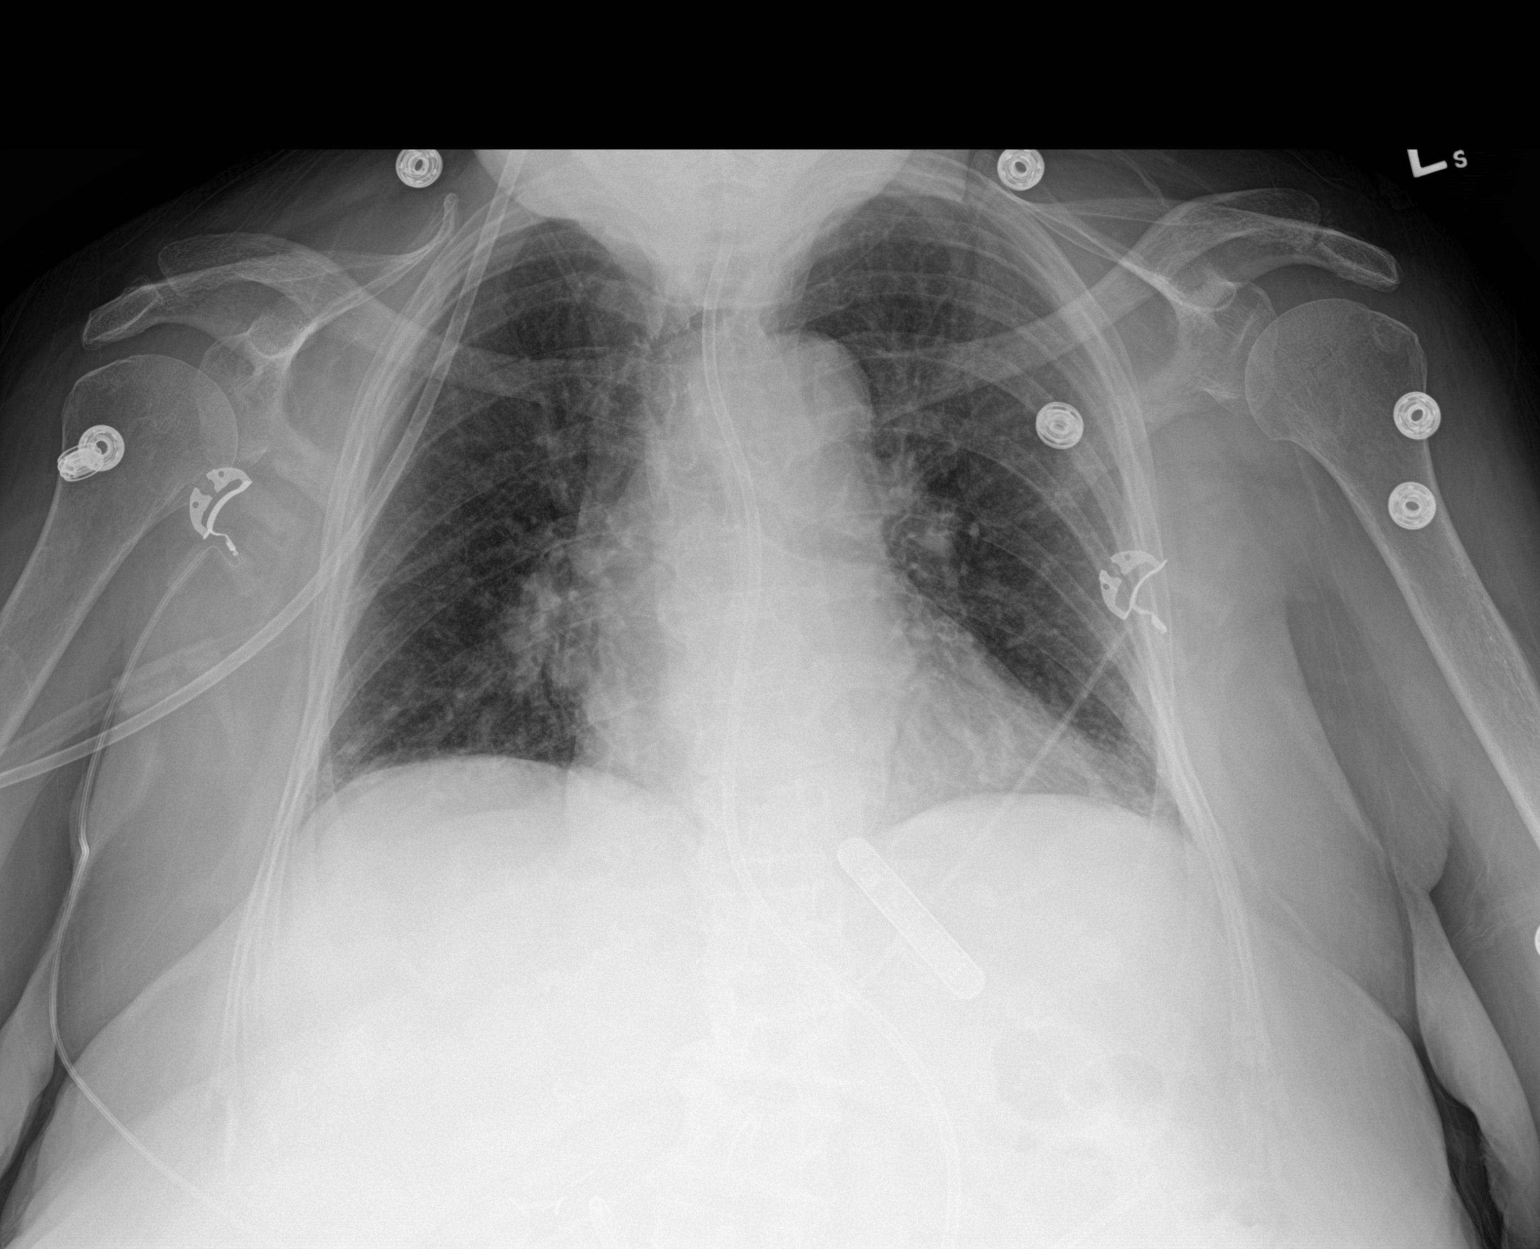

[1 of 1 positions shown; findings below may reference images not displayed]

FINDINGS: Portable AP semi upright view at 2216 hours. Stable somewhat low
lung volumes. Allowing for portable technique the lungs are clear.
Mediastinal contours remain within normal limits. Left medial chest
cardiac event recorder again noted. Enteric feeding tube courses
into the abdomen, and a portion of this appears to be visible across
midline near the cholecystectomy clips. Negative visible bowel gas
pattern.
IMPRESSION: 1.  No acute cardiopulmonary abnormality.
2. Enteric feeding tube courses to the abdomen and probably is post
pyloric, incompletely visible.

## 2021-11-14 DEATH — deceased
